# Patient Record
Sex: Male | Born: 1965 | Race: White | Hispanic: No | Marital: Single | Smoking: Never smoker
Health system: Southern US, Community
[De-identification: ages and names within clinical notes are randomized; demographics above are authoritative.]

## PROBLEM LIST (undated history)

## (undated) DIAGNOSIS — F419 Anxiety disorder, unspecified: Secondary | ICD-10-CM

## (undated) DIAGNOSIS — Z91018 Allergy to other foods: Secondary | ICD-10-CM

## (undated) DIAGNOSIS — K449 Diaphragmatic hernia without obstruction or gangrene: Secondary | ICD-10-CM

## (undated) DIAGNOSIS — Z5189 Encounter for other specified aftercare: Secondary | ICD-10-CM

## (undated) DIAGNOSIS — K589 Irritable bowel syndrome without diarrhea: Secondary | ICD-10-CM

## (undated) DIAGNOSIS — D509 Iron deficiency anemia, unspecified: Secondary | ICD-10-CM

## (undated) DIAGNOSIS — E785 Hyperlipidemia, unspecified: Secondary | ICD-10-CM

## (undated) DIAGNOSIS — T8859XA Other complications of anesthesia, initial encounter: Secondary | ICD-10-CM

## (undated) DIAGNOSIS — R51 Headache: Secondary | ICD-10-CM

## (undated) DIAGNOSIS — K2270 Barrett's esophagus without dysplasia: Secondary | ICD-10-CM

## (undated) DIAGNOSIS — R519 Headache, unspecified: Secondary | ICD-10-CM

## (undated) DIAGNOSIS — J45909 Unspecified asthma, uncomplicated: Secondary | ICD-10-CM

## (undated) DIAGNOSIS — L509 Urticaria, unspecified: Secondary | ICD-10-CM

## (undated) DIAGNOSIS — T4145XA Adverse effect of unspecified anesthetic, initial encounter: Secondary | ICD-10-CM

## (undated) HISTORY — DX: Anxiety disorder, unspecified: F41.9

## (undated) HISTORY — DX: Urticaria, unspecified: L50.9

## (undated) HISTORY — DX: Headache, unspecified: R51.9

## (undated) HISTORY — DX: Unspecified asthma, uncomplicated: J45.909

## (undated) HISTORY — DX: Headache: R51

## (undated) HISTORY — PX: TALC PLEURODESIS: SHX2506

## (undated) HISTORY — DX: Barrett's esophagus without dysplasia: K22.70

## (undated) HISTORY — DX: Hyperlipidemia, unspecified: E78.5

## (undated) HISTORY — DX: Encounter for other specified aftercare: Z51.89

## (undated) HISTORY — DX: Irritable bowel syndrome, unspecified: K58.9

## (undated) HISTORY — DX: Allergy to other foods: Z91.018

## (undated) HISTORY — DX: Iron deficiency anemia, unspecified: D50.9

## (undated) HISTORY — PX: GASTROSTOMY TUBE PLACEMENT: SHX655

---
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
## 2006-11-15 HISTORY — PX: CHOLECYSTECTOMY: SHX55

## 2008-11-15 HISTORY — PX: VENTRAL HERNIA REPAIR: SHX424

## 2015-07-17 HISTORY — PX: HIATAL HERNIA REPAIR: SHX195

## 2015-10-29 ENCOUNTER — Encounter (HOSPITAL_COMMUNITY): Payer: Self-pay | Admitting: *Deleted

## 2015-10-29 ENCOUNTER — Emergency Department (HOSPITAL_COMMUNITY)
Admission: EM | Admit: 2015-10-29 | Discharge: 2015-10-30 | Disposition: A | Discharge status: Home / Self Care | Payer: 59 | Attending: Emergency Medicine | Admitting: Emergency Medicine

## 2015-10-29 ENCOUNTER — Emergency Department (HOSPITAL_COMMUNITY): Payer: 59

## 2015-10-29 DIAGNOSIS — S29001A Unspecified injury of muscle and tendon of front wall of thorax, initial encounter: Secondary | ICD-10-CM | POA: Insufficient documentation

## 2015-10-29 DIAGNOSIS — S199XXA Unspecified injury of neck, initial encounter: Secondary | ICD-10-CM | POA: Diagnosis not present

## 2015-10-29 DIAGNOSIS — S8991XA Unspecified injury of right lower leg, initial encounter: Secondary | ICD-10-CM | POA: Insufficient documentation

## 2015-10-29 DIAGNOSIS — S0990XA Unspecified injury of head, initial encounter: Secondary | ICD-10-CM | POA: Insufficient documentation

## 2015-10-29 DIAGNOSIS — Y9389 Activity, other specified: Secondary | ICD-10-CM | POA: Insufficient documentation

## 2015-10-29 DIAGNOSIS — Z79899 Other long term (current) drug therapy: Secondary | ICD-10-CM | POA: Insufficient documentation

## 2015-10-29 DIAGNOSIS — Y998 Other external cause status: Secondary | ICD-10-CM | POA: Diagnosis not present

## 2015-10-29 DIAGNOSIS — Z8719 Personal history of other diseases of the digestive system: Secondary | ICD-10-CM | POA: Diagnosis not present

## 2015-10-29 DIAGNOSIS — Y92410 Unspecified street and highway as the place of occurrence of the external cause: Secondary | ICD-10-CM | POA: Insufficient documentation

## 2015-10-29 HISTORY — DX: Diaphragmatic hernia without obstruction or gangrene: K44.9

## 2015-10-29 LAB — I-STAT CHEM 8, ED
BUN: 17 mg/dL (ref 6–20)
Calcium, Ion: 1.19 mmol/L (ref 1.12–1.23)
Chloride: 101 mmol/L (ref 101–111)
Creatinine, Ser: 0.8 mg/dL (ref 0.61–1.24)
Glucose, Bld: 92 mg/dL (ref 65–99)
HEMATOCRIT: 33 % — AB (ref 39.0–52.0)
HEMOGLOBIN: 11.2 g/dL — AB (ref 13.0–17.0)
Potassium: 3.9 mmol/L (ref 3.5–5.1)
SODIUM: 139 mmol/L (ref 135–145)
TCO2: 26 mmol/L (ref 0–100)

## 2015-10-29 MED ORDER — SODIUM CHLORIDE 0.9 % IV BOLUS (SEPSIS)
1000.0000 mL | Freq: Once | INTRAVENOUS | Status: AC
  Administered 2015-10-29: 1000 mL via INTRAVENOUS

## 2015-10-29 MED ORDER — FENTANYL CITRATE (PF) 100 MCG/2ML IJ SOLN
50.0000 ug | Freq: Once | INTRAMUSCULAR | Status: AC
  Administered 2015-10-29: 50 ug via INTRAVENOUS
  Filled 2015-10-29: qty 2

## 2015-10-29 MED ORDER — PROMETHAZINE HCL 25 MG/ML IJ SOLN
25.0000 mg | Freq: Once | INTRAMUSCULAR | Status: AC
  Administered 2015-10-29: 25 mg via INTRAVENOUS
  Filled 2015-10-29: qty 1

## 2015-10-29 MED ORDER — HYDROMORPHONE HCL 1 MG/ML IJ SOLN
1.0000 mg | Freq: Once | INTRAMUSCULAR | Status: AC
  Administered 2015-10-29: 1 mg via INTRAVENOUS
  Filled 2015-10-29: qty 1

## 2015-10-29 MED ORDER — IOHEXOL 300 MG/ML  SOLN
100.0000 mL | Freq: Once | INTRAMUSCULAR | Status: AC | PRN
  Administered 2015-10-29: 100 mL via INTRAVENOUS

## 2015-10-29 NOTE — ED Notes (Signed)
Pt taken to CT.

## 2015-10-29 NOTE — ED Notes (Signed)
Pt c/o L sided headache and worsening L sided rib pain
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
## 2015-10-29 NOTE — ED Notes (Signed)
Pt to Xray.

## 2015-10-30 ENCOUNTER — Encounter (HOSPITAL_COMMUNITY): Payer: Self-pay | Admitting: *Deleted

## 2015-10-30 MED ORDER — PROMETHAZINE HCL 25 MG/ML IJ SOLN
25.0000 mg | Freq: Once | INTRAMUSCULAR | Status: AC
  Administered 2015-10-30: 25 mg via INTRAVENOUS
  Filled 2015-10-30: qty 1

## 2015-10-30 MED ORDER — OXYCODONE-ACETAMINOPHEN 5-325 MG PO TABS
2.0000 | ORAL_TABLET | Freq: Once | ORAL | Status: AC
  Administered 2015-10-30: 2 via ORAL
  Filled 2015-10-30: qty 2

## 2015-10-30 MED ORDER — CYCLOBENZAPRINE HCL 10 MG PO TABS: 10.0000 mg | ORAL_TABLET | Freq: Two times a day (BID) | ORAL | Status: DC | PRN

## 2015-10-30 MED ORDER — HEPARIN SOD (PORK) LOCK FLUSH 100 UNIT/ML IV SOLN
500.0000 [IU] | Freq: Once | INTRAVENOUS | Status: AC
  Administered 2015-10-30: 500 [IU]
  Filled 2015-10-30: qty 5

## 2015-10-30 MED ORDER — OXYCODONE-ACETAMINOPHEN 5-325 MG PO TABS: 1.0000 | ORAL_TABLET | Freq: Three times a day (TID) | ORAL | Status: DC | PRN

## 2015-10-30 NOTE — ED Notes (Signed)
Pt requesting more nausea and pain medication. MD made aware

## 2015-12-17 HISTORY — PX: ABDOMINAL SURGERY: SHX537

## 2017-01-12 DIAGNOSIS — R0789 Other chest pain: Secondary | ICD-10-CM | POA: Diagnosis not present

## 2017-01-12 DIAGNOSIS — R079 Chest pain, unspecified: Secondary | ICD-10-CM | POA: Diagnosis not present

## 2017-01-12 DIAGNOSIS — R197 Diarrhea, unspecified: Secondary | ICD-10-CM | POA: Diagnosis not present

## 2017-01-12 DIAGNOSIS — R112 Nausea with vomiting, unspecified: Secondary | ICD-10-CM | POA: Diagnosis not present

## 2017-01-26 DIAGNOSIS — R109 Unspecified abdominal pain: Secondary | ICD-10-CM | POA: Diagnosis not present

## 2017-04-26 DIAGNOSIS — R112 Nausea with vomiting, unspecified: Secondary | ICD-10-CM | POA: Diagnosis not present

## 2017-04-26 DIAGNOSIS — R109 Unspecified abdominal pain: Secondary | ICD-10-CM | POA: Diagnosis not present

## 2017-04-26 DIAGNOSIS — K449 Diaphragmatic hernia without obstruction or gangrene: Secondary | ICD-10-CM | POA: Diagnosis not present

## 2017-04-26 DIAGNOSIS — R1013 Epigastric pain: Secondary | ICD-10-CM | POA: Diagnosis not present

## 2017-04-26 DIAGNOSIS — K210 Gastro-esophageal reflux disease with esophagitis: Secondary | ICD-10-CM | POA: Diagnosis not present

## 2017-04-30 ENCOUNTER — Emergency Department (HOSPITAL_COMMUNITY)
Admission: EM | Admit: 2017-04-30 | Discharge: 2017-04-30 | Disposition: A | Discharge status: Home / Self Care | Payer: Self-pay | Attending: Emergency Medicine | Admitting: Emergency Medicine

## 2017-04-30 ENCOUNTER — Encounter (HOSPITAL_COMMUNITY): Payer: Self-pay

## 2017-04-30 ENCOUNTER — Emergency Department (HOSPITAL_COMMUNITY): Payer: Self-pay

## 2017-04-30 DIAGNOSIS — R079 Chest pain, unspecified: Secondary | ICD-10-CM | POA: Insufficient documentation

## 2017-04-30 DIAGNOSIS — A09 Infectious gastroenteritis and colitis, unspecified: Secondary | ICD-10-CM | POA: Diagnosis not present

## 2017-04-30 DIAGNOSIS — Z5321 Procedure and treatment not carried out due to patient leaving prior to being seen by health care provider: Secondary | ICD-10-CM | POA: Insufficient documentation

## 2017-04-30 DIAGNOSIS — R1084 Generalized abdominal pain: Secondary | ICD-10-CM | POA: Diagnosis not present

## 2017-04-30 DIAGNOSIS — R109 Unspecified abdominal pain: Secondary | ICD-10-CM | POA: Insufficient documentation

## 2017-04-30 LAB — COMPREHENSIVE METABOLIC PANEL
ALT: 19 U/L (ref 17–63)
AST: 25 U/L (ref 15–41)
Albumin: 4.4 g/dL (ref 3.5–5.0)
Alkaline Phosphatase: 63 U/L (ref 38–126)
Anion gap: 9 (ref 5–15)
BUN: 14 mg/dL (ref 6–20)
CHLORIDE: 101 mmol/L (ref 101–111)
CO2: 27 mmol/L (ref 22–32)
CREATININE: 0.92 mg/dL (ref 0.61–1.24)
Calcium: 9.3 mg/dL (ref 8.9–10.3)
GFR calc non Af Amer: >60 mL/min (ref >60)
Glucose, Bld: 119 mg/dL — ABNORMAL HIGH (ref 65–99)
Potassium: 3.7 mmol/L (ref 3.5–5.1)
SODIUM: 137 mmol/L (ref 135–145)
Total Bilirubin: 0.5 mg/dL (ref 0.3–1.2)
Total Protein: 7.4 g/dL (ref 6.5–8.1)

## 2017-04-30 LAB — I-STAT TROPONIN, ED: Troponin i, poc: 0 ng/mL (ref 0.00–0.08)

## 2017-04-30 LAB — CBC
HEMATOCRIT: 45.9 % (ref 39.0–52.0)
HEMOGLOBIN: 16.1 g/dL (ref 13.0–17.0)
MCH: 30 pg (ref 26.0–34.0)
MCHC: 35.1 g/dL (ref 30.0–36.0)
MCV: 85.6 fL (ref 78.0–100.0)
Platelets: 159 10*3/uL (ref 150–400)
RBC: 5.36 MIL/uL (ref 4.22–5.81)
RDW: 15.8 % — ABNORMAL HIGH (ref 11.5–15.5)
WBC: 5.1 10*3/uL (ref 4.0–10.5)

## 2017-04-30 LAB — LIPASE, BLOOD: Lipase: 22 U/L (ref 11–51)

## 2017-04-30 NOTE — ED Triage Notes (Signed)
Pt complaining of mid abdominal pain that radiates to chest x 1 week. Pt states multiple abdominal surgeries in the past. Pt complaining of some SOB.

## 2017-05-06 DIAGNOSIS — E119 Type 2 diabetes mellitus without complications: Secondary | ICD-10-CM | POA: Diagnosis not present

## 2017-05-06 DIAGNOSIS — H40013 Open angle with borderline findings, low risk, bilateral: Secondary | ICD-10-CM | POA: Diagnosis not present

## 2017-05-10 DIAGNOSIS — R1013 Epigastric pain: Secondary | ICD-10-CM | POA: Diagnosis not present

## 2017-05-10 DIAGNOSIS — R109 Unspecified abdominal pain: Secondary | ICD-10-CM | POA: Diagnosis not present

## 2017-05-10 DIAGNOSIS — R1084 Generalized abdominal pain: Secondary | ICD-10-CM | POA: Diagnosis not present

## 2017-05-13 ENCOUNTER — Emergency Department (HOSPITAL_COMMUNITY)
Admission: EM | Admit: 2017-05-13 | Discharge: 2017-05-14 | Disposition: A | Discharge status: Home / Self Care | Payer: Medicare Other | Attending: Emergency Medicine | Admitting: Emergency Medicine

## 2017-05-13 ENCOUNTER — Emergency Department (HOSPITAL_COMMUNITY): Payer: Medicare Other

## 2017-05-13 DIAGNOSIS — R1084 Generalized abdominal pain: Secondary | ICD-10-CM | POA: Diagnosis not present

## 2017-05-13 DIAGNOSIS — R112 Nausea with vomiting, unspecified: Secondary | ICD-10-CM | POA: Insufficient documentation

## 2017-05-13 DIAGNOSIS — Z681 Body mass index (BMI) 19 or less, adult: Secondary | ICD-10-CM | POA: Diagnosis not present

## 2017-05-13 DIAGNOSIS — R109 Unspecified abdominal pain: Secondary | ICD-10-CM

## 2017-05-13 DIAGNOSIS — R197 Diarrhea, unspecified: Secondary | ICD-10-CM | POA: Insufficient documentation

## 2017-05-13 LAB — COMPREHENSIVE METABOLIC PANEL
ALT: 23 U/L (ref 17–63)
ANION GAP: 11 (ref 5–15)
AST: 33 U/L (ref 15–41)
Albumin: 4.4 g/dL (ref 3.5–5.0)
Alkaline Phosphatase: 73 U/L (ref 38–126)
BUN: 11 mg/dL (ref 6–20)
CHLORIDE: 104 mmol/L (ref 101–111)
CO2: 20 mmol/L — AB (ref 22–32)
Calcium: 9.5 mg/dL (ref 8.9–10.3)
Creatinine, Ser: 0.78 mg/dL (ref 0.61–1.24)
GFR calc non Af Amer: >60 mL/min (ref >60)
Glucose, Bld: 109 mg/dL — ABNORMAL HIGH (ref 65–99)
POTASSIUM: 4.1 mmol/L (ref 3.5–5.1)
SODIUM: 135 mmol/L (ref 135–145)
Total Bilirubin: 0.8 mg/dL (ref 0.3–1.2)
Total Protein: 7.6 g/dL (ref 6.5–8.1)

## 2017-05-13 LAB — CBC
HEMATOCRIT: 46.1 % (ref 39.0–52.0)
HEMOGLOBIN: 15.8 g/dL (ref 13.0–17.0)
MCH: 29.2 pg (ref 26.0–34.0)
MCHC: 34.3 g/dL (ref 30.0–36.0)
MCV: 85.2 fL (ref 78.0–100.0)
Platelets: 272 10*3/uL (ref 150–400)
RBC: 5.41 MIL/uL (ref 4.22–5.81)
RDW: 15.1 % (ref 11.5–15.5)
WBC: 5.6 10*3/uL (ref 4.0–10.5)

## 2017-05-13 LAB — I-STAT CG4 LACTIC ACID, ED
Lactic Acid, Venous: 1.77 mmol/L (ref 0.5–1.9)
Lactic Acid, Venous: 1.25 mmol/L (ref 0.5–1.9)

## 2017-05-13 LAB — URINALYSIS, ROUTINE W REFLEX MICROSCOPIC
Bilirubin Urine: NEGATIVE
GLUCOSE, UA: NEGATIVE mg/dL
HGB URINE DIPSTICK: NEGATIVE
Ketones, ur: 5 mg/dL — AB
LEUKOCYTES UA: NEGATIVE
Nitrite: NEGATIVE
Protein, ur: NEGATIVE mg/dL
SPECIFIC GRAVITY, URINE: 1.016 (ref 1.005–1.030)
pH: 6 (ref 5.0–8.0)

## 2017-05-13 LAB — LIPASE, BLOOD: LIPASE: 20 U/L (ref 11–51)

## 2017-05-13 MED ORDER — DIPHENHYDRAMINE HCL 50 MG/ML IJ SOLN
25.0000 mg | Freq: Once | INTRAMUSCULAR | Status: AC
  Administered 2017-05-13: 25 mg via INTRAVENOUS
  Filled 2017-05-13: qty 1

## 2017-05-13 MED ORDER — MORPHINE SULFATE (PF) 4 MG/ML IV SOLN: 4.0000 mg | Freq: Once | INTRAVENOUS | Status: DC

## 2017-05-13 MED ORDER — SODIUM CHLORIDE 0.9 % IV BOLUS (SEPSIS)
1000.0000 mL | Freq: Once | INTRAVENOUS | Status: AC
  Administered 2017-05-13: 1000 mL via INTRAVENOUS

## 2017-05-13 MED ORDER — PROCHLORPERAZINE EDISYLATE 5 MG/ML IJ SOLN
10.0000 mg | Freq: Once | INTRAMUSCULAR | Status: AC
  Administered 2017-05-13: 10 mg via INTRAVENOUS
  Filled 2017-05-13: qty 2

## 2017-05-13 MED ORDER — HYDROMORPHONE HCL 1 MG/ML IJ SOLN
1.0000 mg | Freq: Once | INTRAMUSCULAR | Status: AC
  Administered 2017-05-13: 1 mg via INTRAVENOUS
  Filled 2017-05-13: qty 1

## 2017-05-13 MED ORDER — IOPAMIDOL (ISOVUE-300) INJECTION 61%
INTRAVENOUS | Status: AC
  Filled 2017-05-13: qty 100

## 2017-05-13 NOTE — ED Notes (Signed)
Pt at nurse first requesting update, informed him a room has been assigned and sorry for delay.

## 2017-05-13 NOTE — ED Triage Notes (Signed)
Pt c/o RUQ pain x's 3 days with nausea, vomiting and diarrhea.  St's unable to keep anything down.

## 2017-05-13 NOTE — ED Provider Notes (Signed)
[facility]ER[facility]ENCY DEPT Provider Note   CSN: [id number] Arrival date & time: 05/13/17  1615     History   Chief Complaint Chief Complaint  Patient presents with  . Abdominal Pain  . Emesis  . Nausea    HPI Edwin DoverWilliam E Hall is a 51 y.o. male.  51 yo M with a cc of right sided abdominal pain.  Going on for past 3 day. N/v/d fever.  The patient has had multiple abdominal surgeries including an appendectomy cholecystectomy pyloroplasty. 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 He thinks this pain feels like his prior diaphragm rupturing. All his surgeries were done in [place]. Denies fever with this.   The history is provided by the patient.  Abdominal Pain   This is a new problem. The current episode started 2 days ago. The problem occurs constantly. The problem has been gradually worsening. The pain is associated with eating. The pain is located in the RUQ and RLQ. The pain is at a severity of 10/10. The pain is severe. Associated symptoms include diarrhea, nausea and vomiting. Pertinent negatives include fever, headaches, arthralgias and myalgias. Nothing aggravates the symptoms. Nothing relieves the symptoms.  Emesis   Associated symptoms include abdominal pain and diarrhea. Pertinent negatives include no arthralgias, no chills, no fever, no headaches and no myalgias.    Past Medical History:  Diagnosis Date  . Hiatal hernia     There are no active problems to display for this patient.   Past Surgical History:  Procedure Laterality Date  . ABDOMINAL SURGERY    . APPENDECTOMY    . CHOLECYSTECTOMY    . GASTROSTOMY TUBE PLACEMENT         Home Medications    Prior to Admission medications   Medication Sig Start Date End Date Taking? Authorizing Provider  Cyanocobalamin (B-12 PO) Take 1 tablet by mouth daily.   Yes [provider]  Multiple Vitamin (MULTIVITAMIN WITH MINERALS) TABS tablet Take 1 tablet by mouth daily.   Yes [provider]  promethazine (PHENERGAN) 12.5 MG tablet Take 12.5 mg by mouth 2 (two) times daily as needed. 02/21/17  Yes [provider]  cyclobenzaprine (FLEXERIL) 10 MG tablet Take 1 tablet (10 mg total) by mouth 2 (two) times daily as needed for muscle spasms. Patient not taking: Reported on 05/13/2017 10/30/15   Mesner, Barbara CowerJason, MD  oxyCODONE-acetaminophen (PERCOCET/ROXICET) 5-325 MG tablet Take 1-2 tablets by mouth every 8 (eight) hours as needed for severe pain. Patient not taking: Reported on 05/13/2017 10/30/15   Mesner, Barbara CowerJason, MD    Family History No family history on file.  Social History Social History  Substance Use Topics  . Smoking status: Never Smoker  . Smokeless tobacco: Never Used  . Alcohol use No     Allergies   Other; Shellfish allergy; Tetracyclines & related; and Zofran [ondansetron hcl]   Review of Systems Review of Systems  Constitutional: Negative for chills and fever.  HENT: Negative for congestion and facial swelling.   Eyes: Negative for discharge and visual disturbance.  Respiratory: Negative for shortness of breath.   Cardiovascular: Negative for chest pain and palpitations.  Gastrointestinal: Positive for abdominal pain, diarrhea, nausea and vomiting.  Musculoskeletal: Negative for arthralgias and myalgias.  Skin: Negative for color change and rash.  Neurological: Negative for tremors, syncope and headaches.  Psychiatric/Behavioral: Negative for confusion and dysphoric mood.     Physical Exam Updated Vital Signs BP (!) 136/92   Pulse 88  Temp 98.4 F (36.9 C) (Oral)   Resp 18   Ht 5\' 8"  (1.727 m)   Wt 53.1 kg (117 lb)   SpO2 99%   BMI 17.79 kg/m   Physical Exam  Constitutional: He is oriented to person, place, and time. He appears well-developed and well-nourished.  HENT:  Head: Normocephalic and atraumatic.  Eyes: EOM are normal. Pupils are equal, round, and reactive to light.  Neck: Normal range of motion. Neck supple. No JVD  present.  Cardiovascular: Normal rate and regular rhythm.  Exam reveals no gallop and no friction rub.   No murmur heard. Pulmonary/Chest: No respiratory distress. He has no wheezes.  Abdominal: He exhibits no distension. There is tenderness. There is no rebound and no guarding.  Multiple abdominal scars. Pain worse to the right midabdomen.  Musculoskeletal: Normal range of motion.  Neurological: He is alert and oriented to person, place, and time.  Skin: No rash noted. No pallor.  Psychiatric: He has a normal mood and affect. His behavior is normal.  Nursing note and vitals reviewed.    ED Treatments / Results  Labs (all labs ordered are listed, but only abnormal results are displayed) Labs Reviewed  COMPREHENSIVE METABOLIC PANEL - Abnormal; Notable for the following:       Result Value   CO2 20 (*)    Glucose, Bld 109 (*)    All other components within normal limits  URINALYSIS, ROUTINE W REFLEX MICROSCOPIC - Abnormal; Notable for the following:    Ketones, ur 5 (*)    All other components within normal limits  LIPASE, BLOOD  CBC  I-STAT CG4 LACTIC ACID, ED  I-STAT CG4 LACTIC ACID, ED    EKG  EKG Interpretation None       Radiology Ct Abdomen Pelvis Wo Contrast  Result Date: 05/13/2017 CLINICAL DATA:  Right upper quadrant pain for 3 days with nausea vomiting and diarrhea. EXAM: CT ABDOMEN AND PELVIS WITHOUT CONTRAST TECHNIQUE: Multidetector CT imaging of the abdomen and pelvis was performed following the standard protocol without IV contrast. 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 No acute findings in the lower chest. Hepatobiliary: Cholecystectomy. No focal liver lesions. No bile duct dilatation. Pancreas: Unremarkable. No pancreatic ductal dilatation or surrounding inflammatory changes. Spleen: Normal in size without focal abnormality. Adrenals/Urinary Tract: Adrenal glands are unremarkable. Kidneys are normal,  without renal calculi, focal lesion, or hydronephrosis. Bladder is unremarkable. Stomach/Bowel: Surgical clips around the proximal stomach. Small bowel is unremarkable. Colon is unremarkable. Prior appendectomy. Vascular/Lymphatic: The abdominal aorta is normal in caliber with minimal atherosclerotic calcifications. No adenopathy in the abdomen or pelvis. Reproductive: Unremarkable Other: No focal inflammation.  No ascites. Musculoskeletal: No significant skeletal lesion. IMPRESSION: No acute findings. Prior surgery in the epigastrium. 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 Electronically Signed   By: Ellery Plunk M.D.   On: 05/13/2017 23:25    Procedures Procedures (including critical care time)  Medications Ordered in ED Medications  iopamidol (ISOVUE-300) 61 % injection (not administered)  sodium chloride 0.9 % bolus 1,000 mL (1,000 mLs Intravenous New Bag/Given 05/13/17 2243)  prochlorperazine (COMPAZINE) injection 10 mg (10 mg Intravenous Given 05/13/17 2233)  diphenhydrAMINE (BENADRYL) injection 25 mg (25 mg Intravenous Given 05/13/17 2233)  HYDROmorphone (DILAUDID) injection 1 mg (1 mg Intravenous Given 05/13/17 2234)     Initial Impression / Assessment and Plan / ED Course  I have reviewed the triage vital signs and the nursing notes.  Pertinent  labs & imaging results that were available during my care of the patient were reviewed by me and considered in my medical decision making (see chart for details).     51 yo M with a chief complaint of nausea vomiting diarrhea. Going on for the past 3 days. On exam the patient has diffuse abdominal pain but worse in the right side of the abdomen. Multiple surgical scars on exam. CT scan without acute finding. On reassessment patient feeling much better. Tolerating by mouth. Discharge home.  12:11 AM:  I have discussed the diagnosis/risks/treatment options with the patient and believe the pt to be eligible for discharge home to  follow-up with PCP. We also discussed returning to the ED immediately if new or worsening sx occur. We discussed the sx which are most concerning (e.g., sudden worsening pain, fever, inability to tolerate by mouth) that necessitate immediate return. Medications administered to the patient during their visit and any new prescriptions provided to the patient are listed below.  Medications given during this visit Medications  iopamidol (ISOVUE-300) 61 % injection (not administered)  sodium chloride 0.9 % bolus 1,000 mL (1,000 mLs Intravenous New Bag/Given 05/13/17 2243)  prochlorperazine (COMPAZINE) injection 10 mg (10 mg Intravenous Given 05/13/17 2233)  diphenhydrAMINE (BENADRYL) injection 25 mg (25 mg Intravenous Given 05/13/17 2233)  HYDROmorphone (DILAUDID) injection 1 mg (1 mg Intravenous Given 05/13/17 2234)     The patient appears reasonably screen and/or stabilized for discharge and I doubt any other medical condition or other [facility] requiring further screening, evaluation, or treatment in the ED at this time prior to discharge.    Final Clinical Impressions(s) / ED Diagnoses   Final diagnoses:  Abdominal pain    New Prescriptions New Prescriptions   No medications on file     Melene Plan, DO 05/14/17 0011

## 2017-05-14 MED ORDER — PROMETHAZINE HCL 25 MG PO TABS: 25.0000 mg | ORAL_TABLET | Freq: Four times a day (QID) | ORAL | 0 refills | Status: DC | PRN

## 2017-05-14 MED ORDER — HEPARIN SOD (PORK) LOCK FLUSH 100 UNIT/ML IV SOLN: 500.0000 [IU] | INTRAVENOUS | Status: DC | PRN

## 2017-05-14 NOTE — Discharge Instructions (Signed)
Imodium for diarrhea.  Return for pinpoint abdominal tenderness, fever or inability to eat or drink.

## 2017-05-14 NOTE — ED Notes (Signed)
Pt stable and ambulatory for discharge

## 2017-05-31 DIAGNOSIS — R1110 Vomiting, unspecified: Secondary | ICD-10-CM | POA: Diagnosis not present

## 2017-05-31 DIAGNOSIS — R109 Unspecified abdominal pain: Secondary | ICD-10-CM | POA: Diagnosis not present

## 2017-05-31 DIAGNOSIS — R51 Headache: Secondary | ICD-10-CM | POA: Diagnosis not present

## 2017-05-31 DIAGNOSIS — R197 Diarrhea, unspecified: Secondary | ICD-10-CM | POA: Diagnosis not present

## 2017-06-21 DIAGNOSIS — R51 Headache: Secondary | ICD-10-CM | POA: Diagnosis not present

## 2017-06-21 DIAGNOSIS — Z681 Body mass index (BMI) 19 or less, adult: Secondary | ICD-10-CM | POA: Diagnosis not present

## 2017-06-24 DIAGNOSIS — R51 Headache: Secondary | ICD-10-CM | POA: Diagnosis not present

## 2017-07-01 DIAGNOSIS — G4733 Obstructive sleep apnea (adult) (pediatric): Secondary | ICD-10-CM | POA: Diagnosis not present

## 2017-07-01 DIAGNOSIS — R0902 Hypoxemia: Secondary | ICD-10-CM | POA: Diagnosis not present

## 2017-07-05 DIAGNOSIS — G4733 Obstructive sleep apnea (adult) (pediatric): Secondary | ICD-10-CM | POA: Diagnosis not present

## 2017-07-07 DIAGNOSIS — I10 Essential (primary) hypertension: Secondary | ICD-10-CM | POA: Diagnosis not present

## 2017-07-07 DIAGNOSIS — F3160 Bipolar disorder, current episode mixed, unspecified: Secondary | ICD-10-CM | POA: Diagnosis not present

## 2017-07-07 DIAGNOSIS — R51 Headache: Secondary | ICD-10-CM | POA: Diagnosis not present

## 2017-07-07 DIAGNOSIS — M79609 Pain in unspecified limb: Secondary | ICD-10-CM | POA: Diagnosis not present

## 2017-08-04 DIAGNOSIS — R1310 Dysphagia, unspecified: Secondary | ICD-10-CM | POA: Diagnosis not present

## 2017-08-04 DIAGNOSIS — K219 Gastro-esophageal reflux disease without esophagitis: Secondary | ICD-10-CM | POA: Diagnosis not present

## 2017-08-04 DIAGNOSIS — K5900 Constipation, unspecified: Secondary | ICD-10-CM | POA: Diagnosis not present

## 2017-08-04 DIAGNOSIS — K2270 Barrett's esophagus without dysplasia: Secondary | ICD-10-CM | POA: Diagnosis not present

## 2017-08-05 DIAGNOSIS — K209 Esophagitis, unspecified: Secondary | ICD-10-CM | POA: Diagnosis not present

## 2017-08-05 DIAGNOSIS — K2270 Barrett's esophagus without dysplasia: Secondary | ICD-10-CM | POA: Diagnosis not present

## 2017-08-05 DIAGNOSIS — R1310 Dysphagia, unspecified: Secondary | ICD-10-CM | POA: Diagnosis not present

## 2017-08-09 DIAGNOSIS — R51 Headache: Secondary | ICD-10-CM | POA: Diagnosis not present

## 2017-08-09 DIAGNOSIS — R110 Nausea: Secondary | ICD-10-CM | POA: Diagnosis not present

## 2017-08-09 DIAGNOSIS — Z23 Encounter for immunization: Secondary | ICD-10-CM | POA: Diagnosis not present

## 2017-08-09 DIAGNOSIS — Z139 Encounter for screening, unspecified: Secondary | ICD-10-CM | POA: Diagnosis not present

## 2017-08-09 DIAGNOSIS — Z6820 Body mass index (BMI) 20.0-20.9, adult: Secondary | ICD-10-CM | POA: Diagnosis not present

## 2017-08-29 ENCOUNTER — Encounter: Payer: Self-pay | Admitting: *Deleted

## 2017-08-30 ENCOUNTER — Ambulatory Visit: Payer: Medicare Other | Admitting: Diagnostic Neuroimaging

## 2017-08-31 ENCOUNTER — Encounter: Payer: Self-pay | Admitting: Diagnostic Neuroimaging

## 2017-10-28 DIAGNOSIS — J300 Vasomotor rhinitis: Secondary | ICD-10-CM | POA: Diagnosis not present

## 2017-10-28 DIAGNOSIS — L218 Other seborrheic dermatitis: Secondary | ICD-10-CM | POA: Diagnosis not present

## 2017-10-28 DIAGNOSIS — Z6820 Body mass index (BMI) 20.0-20.9, adult: Secondary | ICD-10-CM | POA: Diagnosis not present

## 2017-11-16 DIAGNOSIS — Z6820 Body mass index (BMI) 20.0-20.9, adult: Secondary | ICD-10-CM | POA: Diagnosis not present

## 2017-11-16 DIAGNOSIS — J300 Vasomotor rhinitis: Secondary | ICD-10-CM | POA: Diagnosis not present

## 2017-11-18 DIAGNOSIS — K591 Functional diarrhea: Secondary | ICD-10-CM | POA: Diagnosis not present

## 2017-11-18 DIAGNOSIS — R0602 Shortness of breath: Secondary | ICD-10-CM | POA: Diagnosis not present

## 2017-11-18 DIAGNOSIS — R197 Diarrhea, unspecified: Secondary | ICD-10-CM | POA: Diagnosis not present

## 2017-11-18 DIAGNOSIS — R112 Nausea with vomiting, unspecified: Secondary | ICD-10-CM | POA: Diagnosis not present

## 2017-11-20 DIAGNOSIS — R0602 Shortness of breath: Secondary | ICD-10-CM | POA: Diagnosis not present

## 2017-11-20 DIAGNOSIS — J111 Influenza due to unidentified influenza virus with other respiratory manifestations: Secondary | ICD-10-CM | POA: Diagnosis not present

## 2017-11-20 DIAGNOSIS — R197 Diarrhea, unspecified: Secondary | ICD-10-CM | POA: Diagnosis not present

## 2017-11-20 DIAGNOSIS — K219 Gastro-esophageal reflux disease without esophagitis: Secondary | ICD-10-CM | POA: Diagnosis not present

## 2017-11-20 DIAGNOSIS — R112 Nausea with vomiting, unspecified: Secondary | ICD-10-CM | POA: Diagnosis not present

## 2017-11-20 DIAGNOSIS — Z79899 Other long term (current) drug therapy: Secondary | ICD-10-CM | POA: Diagnosis not present

## 2017-11-20 DIAGNOSIS — R1110 Vomiting, unspecified: Secondary | ICD-10-CM | POA: Diagnosis not present

## 2017-11-30 DIAGNOSIS — J22 Unspecified acute lower respiratory infection: Secondary | ICD-10-CM | POA: Diagnosis not present

## 2017-11-30 DIAGNOSIS — Z6820 Body mass index (BMI) 20.0-20.9, adult: Secondary | ICD-10-CM | POA: Diagnosis not present

## 2017-11-30 DIAGNOSIS — R05 Cough: Secondary | ICD-10-CM | POA: Diagnosis not present

## 2017-12-05 DIAGNOSIS — J22 Unspecified acute lower respiratory infection: Secondary | ICD-10-CM | POA: Diagnosis not present

## 2017-12-09 DIAGNOSIS — R0602 Shortness of breath: Secondary | ICD-10-CM | POA: Diagnosis not present

## 2017-12-09 DIAGNOSIS — R197 Diarrhea, unspecified: Secondary | ICD-10-CM | POA: Diagnosis not present

## 2017-12-09 DIAGNOSIS — J479 Bronchiectasis, uncomplicated: Secondary | ICD-10-CM | POA: Diagnosis not present

## 2017-12-09 DIAGNOSIS — J90 Pleural effusion, not elsewhere classified: Secondary | ICD-10-CM | POA: Diagnosis not present

## 2017-12-09 DIAGNOSIS — R112 Nausea with vomiting, unspecified: Secondary | ICD-10-CM | POA: Diagnosis not present

## 2017-12-16 DIAGNOSIS — J9801 Acute bronchospasm: Secondary | ICD-10-CM | POA: Diagnosis not present

## 2017-12-16 DIAGNOSIS — Z6820 Body mass index (BMI) 20.0-20.9, adult: Secondary | ICD-10-CM | POA: Diagnosis not present

## 2017-12-22 DIAGNOSIS — Z7189 Other specified counseling: Secondary | ICD-10-CM | POA: Diagnosis not present

## 2017-12-22 DIAGNOSIS — Z6820 Body mass index (BMI) 20.0-20.9, adult: Secondary | ICD-10-CM | POA: Diagnosis not present

## 2017-12-22 DIAGNOSIS — J9801 Acute bronchospasm: Secondary | ICD-10-CM | POA: Diagnosis not present

## 2017-12-22 DIAGNOSIS — Z1331 Encounter for screening for depression: Secondary | ICD-10-CM | POA: Diagnosis not present

## 2017-12-22 DIAGNOSIS — Z139 Encounter for screening, unspecified: Secondary | ICD-10-CM | POA: Diagnosis not present

## 2017-12-22 DIAGNOSIS — Z0000 Encounter for general adult medical examination without abnormal findings: Secondary | ICD-10-CM | POA: Diagnosis not present

## 2018-03-30 DIAGNOSIS — Z1211 Encounter for screening for malignant neoplasm of colon: Secondary | ICD-10-CM | POA: Diagnosis not present

## 2018-03-30 DIAGNOSIS — Z1331 Encounter for screening for depression: Secondary | ICD-10-CM | POA: Diagnosis not present

## 2018-03-30 DIAGNOSIS — Z0000 Encounter for general adult medical examination without abnormal findings: Secondary | ICD-10-CM | POA: Diagnosis not present

## 2018-03-30 DIAGNOSIS — Z1212 Encounter for screening for malignant neoplasm of rectum: Secondary | ICD-10-CM | POA: Diagnosis not present

## 2018-04-04 DIAGNOSIS — Z111 Encounter for screening for respiratory tuberculosis: Secondary | ICD-10-CM | POA: Diagnosis not present

## 2018-04-24 DIAGNOSIS — R1900 Intra-abdominal and pelvic swelling, mass and lump, unspecified site: Secondary | ICD-10-CM | POA: Diagnosis not present

## 2018-04-24 DIAGNOSIS — I700 Atherosclerosis of aorta: Secondary | ICD-10-CM | POA: Diagnosis not present

## 2018-04-24 DIAGNOSIS — N289 Disorder of kidney and ureter, unspecified: Secondary | ICD-10-CM | POA: Diagnosis not present

## 2018-04-26 DIAGNOSIS — R109 Unspecified abdominal pain: Secondary | ICD-10-CM | POA: Diagnosis not present

## 2018-08-03 DIAGNOSIS — Z681 Body mass index (BMI) 19 or less, adult: Secondary | ICD-10-CM | POA: Diagnosis not present

## 2018-08-03 DIAGNOSIS — N529 Male erectile dysfunction, unspecified: Secondary | ICD-10-CM | POA: Diagnosis not present

## 2018-08-22 DIAGNOSIS — Z95828 Presence of other vascular implants and grafts: Secondary | ICD-10-CM | POA: Diagnosis not present

## 2018-08-22 DIAGNOSIS — N529 Male erectile dysfunction, unspecified: Secondary | ICD-10-CM | POA: Diagnosis not present

## 2018-08-22 DIAGNOSIS — Z681 Body mass index (BMI) 19 or less, adult: Secondary | ICD-10-CM | POA: Diagnosis not present

## 2018-08-28 DIAGNOSIS — N529 Male erectile dysfunction, unspecified: Secondary | ICD-10-CM | POA: Diagnosis not present

## 2018-08-28 DIAGNOSIS — Z23 Encounter for immunization: Secondary | ICD-10-CM | POA: Diagnosis not present

## 2018-08-28 DIAGNOSIS — Z6820 Body mass index (BMI) 20.0-20.9, adult: Secondary | ICD-10-CM | POA: Diagnosis not present

## 2018-08-28 DIAGNOSIS — F419 Anxiety disorder, unspecified: Secondary | ICD-10-CM | POA: Diagnosis not present

## 2018-09-11 DIAGNOSIS — N529 Male erectile dysfunction, unspecified: Secondary | ICD-10-CM | POA: Diagnosis not present

## 2018-09-11 DIAGNOSIS — Z131 Encounter for screening for diabetes mellitus: Secondary | ICD-10-CM | POA: Diagnosis not present

## 2018-09-19 DIAGNOSIS — J45998 Other asthma: Secondary | ICD-10-CM | POA: Diagnosis not present

## 2018-09-19 DIAGNOSIS — E46 Unspecified protein-calorie malnutrition: Secondary | ICD-10-CM | POA: Diagnosis not present

## 2018-09-19 DIAGNOSIS — I998 Other disorder of circulatory system: Secondary | ICD-10-CM | POA: Diagnosis not present

## 2018-09-19 DIAGNOSIS — Z125 Encounter for screening for malignant neoplasm of prostate: Secondary | ICD-10-CM | POA: Diagnosis not present

## 2018-09-19 DIAGNOSIS — F321 Major depressive disorder, single episode, moderate: Secondary | ICD-10-CM | POA: Diagnosis not present

## 2018-09-19 DIAGNOSIS — R1013 Epigastric pain: Secondary | ICD-10-CM | POA: Diagnosis not present

## 2018-09-25 DIAGNOSIS — E46 Unspecified protein-calorie malnutrition: Secondary | ICD-10-CM | POA: Diagnosis not present

## 2018-09-25 DIAGNOSIS — Z681 Body mass index (BMI) 19 or less, adult: Secondary | ICD-10-CM | POA: Diagnosis not present

## 2018-11-09 DIAGNOSIS — R5381 Other malaise: Secondary | ICD-10-CM | POA: Diagnosis not present

## 2018-11-09 DIAGNOSIS — D509 Iron deficiency anemia, unspecified: Secondary | ICD-10-CM | POA: Diagnosis not present

## 2018-11-09 DIAGNOSIS — R5383 Other fatigue: Secondary | ICD-10-CM | POA: Diagnosis not present

## 2018-11-09 DIAGNOSIS — F321 Major depressive disorder, single episode, moderate: Secondary | ICD-10-CM | POA: Diagnosis not present

## 2018-11-16 DIAGNOSIS — F321 Major depressive disorder, single episode, moderate: Secondary | ICD-10-CM | POA: Diagnosis not present

## 2018-11-16 DIAGNOSIS — Z681 Body mass index (BMI) 19 or less, adult: Secondary | ICD-10-CM | POA: Diagnosis not present

## 2018-11-16 DIAGNOSIS — Z6820 Body mass index (BMI) 20.0-20.9, adult: Secondary | ICD-10-CM | POA: Diagnosis not present

## 2018-11-16 DIAGNOSIS — K921 Melena: Secondary | ICD-10-CM | POA: Diagnosis not present

## 2018-11-16 DIAGNOSIS — E46 Unspecified protein-calorie malnutrition: Secondary | ICD-10-CM | POA: Diagnosis not present

## 2018-12-21 DIAGNOSIS — M791 Myalgia, unspecified site: Secondary | ICD-10-CM | POA: Diagnosis not present

## 2018-12-21 DIAGNOSIS — R5383 Other fatigue: Secondary | ICD-10-CM | POA: Diagnosis not present

## 2018-12-21 DIAGNOSIS — Z6820 Body mass index (BMI) 20.0-20.9, adult: Secondary | ICD-10-CM | POA: Diagnosis not present

## 2018-12-21 DIAGNOSIS — F321 Major depressive disorder, single episode, moderate: Secondary | ICD-10-CM | POA: Diagnosis not present

## 2019-01-04 DIAGNOSIS — Z209 Contact with and (suspected) exposure to unspecified communicable disease: Secondary | ICD-10-CM | POA: Diagnosis not present

## 2019-01-04 DIAGNOSIS — S46812A Strain of other muscles, fascia and tendons at shoulder and upper arm level, left arm, initial encounter: Secondary | ICD-10-CM | POA: Diagnosis not present

## 2019-01-04 DIAGNOSIS — Z681 Body mass index (BMI) 19 or less, adult: Secondary | ICD-10-CM | POA: Diagnosis not present

## 2019-02-15 DIAGNOSIS — Z6820 Body mass index (BMI) 20.0-20.9, adult: Secondary | ICD-10-CM | POA: Diagnosis not present

## 2019-02-15 DIAGNOSIS — Z7189 Other specified counseling: Secondary | ICD-10-CM | POA: Diagnosis not present

## 2019-02-15 DIAGNOSIS — H00015 Hordeolum externum left lower eyelid: Secondary | ICD-10-CM | POA: Diagnosis not present

## 2019-02-15 DIAGNOSIS — Z209 Contact with and (suspected) exposure to unspecified communicable disease: Secondary | ICD-10-CM | POA: Diagnosis not present

## 2019-02-15 DIAGNOSIS — L239 Allergic contact dermatitis, unspecified cause: Secondary | ICD-10-CM | POA: Diagnosis not present

## 2019-02-21 DIAGNOSIS — L02419 Cutaneous abscess of limb, unspecified: Secondary | ICD-10-CM | POA: Diagnosis not present

## 2019-02-21 DIAGNOSIS — H00016 Hordeolum externum left eye, unspecified eyelid: Secondary | ICD-10-CM | POA: Diagnosis not present

## 2019-03-16 DIAGNOSIS — K210 Gastro-esophageal reflux disease with esophagitis: Secondary | ICD-10-CM | POA: Diagnosis not present

## 2019-03-16 DIAGNOSIS — R1310 Dysphagia, unspecified: Secondary | ICD-10-CM | POA: Diagnosis not present

## 2019-03-16 DIAGNOSIS — K2270 Barrett's esophagus without dysplasia: Secondary | ICD-10-CM | POA: Diagnosis not present

## 2019-03-21 DIAGNOSIS — K449 Diaphragmatic hernia without obstruction or gangrene: Secondary | ICD-10-CM | POA: Diagnosis not present

## 2019-03-21 DIAGNOSIS — K2270 Barrett's esophagus without dysplasia: Secondary | ICD-10-CM | POA: Diagnosis not present

## 2019-03-21 DIAGNOSIS — K210 Gastro-esophageal reflux disease with esophagitis: Secondary | ICD-10-CM | POA: Diagnosis not present

## 2019-03-21 DIAGNOSIS — R1310 Dysphagia, unspecified: Secondary | ICD-10-CM | POA: Diagnosis not present

## 2019-04-17 DIAGNOSIS — F321 Major depressive disorder, single episode, moderate: Secondary | ICD-10-CM | POA: Diagnosis not present

## 2019-04-17 DIAGNOSIS — Z131 Encounter for screening for diabetes mellitus: Secondary | ICD-10-CM | POA: Diagnosis not present

## 2019-04-17 DIAGNOSIS — F419 Anxiety disorder, unspecified: Secondary | ICD-10-CM | POA: Diagnosis not present

## 2019-04-17 DIAGNOSIS — Z0000 Encounter for general adult medical examination without abnormal findings: Secondary | ICD-10-CM | POA: Diagnosis not present

## 2019-07-06 ENCOUNTER — Other Ambulatory Visit: Payer: Self-pay

## 2019-07-06 ENCOUNTER — Other Ambulatory Visit: Payer: Self-pay | Admitting: Family Medicine

## 2019-07-06 ENCOUNTER — Ambulatory Visit: Payer: Self-pay

## 2019-07-06 DIAGNOSIS — M546 Pain in thoracic spine: Secondary | ICD-10-CM

## 2019-07-17 DIAGNOSIS — R634 Abnormal weight loss: Secondary | ICD-10-CM | POA: Diagnosis not present

## 2019-07-17 DIAGNOSIS — R1084 Generalized abdominal pain: Secondary | ICD-10-CM | POA: Diagnosis not present

## 2019-07-17 DIAGNOSIS — R197 Diarrhea, unspecified: Secondary | ICD-10-CM | POA: Diagnosis not present

## 2019-07-25 DIAGNOSIS — R1084 Generalized abdominal pain: Secondary | ICD-10-CM | POA: Diagnosis not present

## 2019-07-25 DIAGNOSIS — K469 Unspecified abdominal hernia without obstruction or gangrene: Secondary | ICD-10-CM | POA: Diagnosis not present

## 2019-07-31 DIAGNOSIS — R1084 Generalized abdominal pain: Secondary | ICD-10-CM | POA: Diagnosis not present

## 2019-07-31 DIAGNOSIS — K458 Other specified abdominal hernia without obstruction or gangrene: Secondary | ICD-10-CM | POA: Diagnosis not present

## 2019-07-31 DIAGNOSIS — R634 Abnormal weight loss: Secondary | ICD-10-CM | POA: Diagnosis not present

## 2019-07-31 DIAGNOSIS — R197 Diarrhea, unspecified: Secondary | ICD-10-CM | POA: Diagnosis not present

## 2019-08-08 DIAGNOSIS — R634 Abnormal weight loss: Secondary | ICD-10-CM | POA: Diagnosis not present

## 2019-10-02 DIAGNOSIS — K449 Diaphragmatic hernia without obstruction or gangrene: Secondary | ICD-10-CM | POA: Diagnosis not present

## 2019-11-27 ENCOUNTER — Encounter: Payer: Self-pay | Admitting: Allergy

## 2019-11-27 ENCOUNTER — Ambulatory Visit (INDEPENDENT_AMBULATORY_CARE_PROVIDER_SITE_OTHER): Payer: Medicare Other | Admitting: Allergy

## 2019-11-27 ENCOUNTER — Other Ambulatory Visit: Payer: Self-pay

## 2019-11-27 VITALS — BP 122/90 | HR 79 | Temp 98.2°F | Resp 16 | Ht 66.0 in | Wt 139.6 lb

## 2019-11-27 DIAGNOSIS — J3089 Other allergic rhinitis: Secondary | ICD-10-CM | POA: Diagnosis not present

## 2019-11-27 DIAGNOSIS — T7800XA Anaphylactic reaction due to unspecified food, initial encounter: Secondary | ICD-10-CM | POA: Diagnosis not present

## 2019-11-27 DIAGNOSIS — L219 Seborrheic dermatitis, unspecified: Secondary | ICD-10-CM | POA: Diagnosis not present

## 2019-11-27 MED ORDER — AZELASTINE HCL 0.1 % NA SOLN: NASAL | 5 refills | Status: DC

## 2019-11-27 MED ORDER — EPINEPHRINE 0.3 MG/0.3ML IJ SOAJ: INTRAMUSCULAR | 1 refills | Status: DC

## 2019-11-27 NOTE — Patient Instructions (Signed)
Allergies  - environmental allergy skin testing today is positive to tree, grass and weed pollens as well as dust mites  - allergen avoidance measures discussed/handouts provided  - try nasal antihistamine, Astelin 2 sprays each nostril twice a day for control of nasal drainage  - can try Allegra 180mg  daily as needed to see if this oral antihistamine is more effective than the others you have tried in the past  - if medication management is ineffective then consider another course of allergen immunotherapy  Food allergy  - continue avoidance of shellfish and fish in diet  - provided with emergency action plan.  Recommend having access to epinephrine device in case of allergic reaction  Scalp dermatitis  - can try Neutrogenia T-sal or if Head and Shoulders is effective can continue use of this shampoo  Follow-up 3-4 months or sooner if needed

## 2019-11-27 NOTE — Progress Notes (Signed)
New Patient Note  RE: Edwin Hall MRN: [id number] DOB: Apr 21, 1966 Date of Office Visit: 11/27/2019  Referring provider: Nehemiah Settle, MD Primary care provider: Maryella Shivers, MD  Chief Complaint: Nasal drainage  History of present illness: Edwin Hall is a 54 y.o. male presenting today for consultation for allergies.  He moved to [place] about 4 years from [place].  He states he never had any allergy symptoms while [place].  Since he has been here however he reports having constant nasal drainage and needing to blow his nose.  Denies any congestion, ocular symptoms, ear symptoms or sneezing.  He has been on Xyzal for past 4 month but this has not made any difference.  He has tried Claritin, Zyrtec in the past which also was not helpful.  He has tried over-the-counter nasal sprays which also were not helpful. He was on allergen immunotherapy as child.    He avoids all fish and shellfish.  He reports 8 years ago he had a reaction that involved throat swelling after eating crab meat and he ended up in the hospital.  He does not have an epinephrine device.  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  He has no interest in eating these foods.  He denies history of eczema.  However he does report having some itchiness of his upper back typically after work.  He also reports having a flakiness of his scalp and he is using head and shoulders shampoo currently.  He does report childhood history of asthma when he was around 54 years old diagnosed with his last occurrence of symptoms around 54 years old.  He states he has not had any asthma symptoms in adulthood.  Review of systems: Review of Systems  Constitutional: Negative.   HENT: Negative.   Eyes: Negative.   Respiratory: Negative.   Cardiovascular: Negative.   Gastrointestinal: Negative.   Musculoskeletal: Negative.   Skin: Positive for itching.  Neurological: Negative.     All other systems negative unless  noted above in HPI  Past medical history: Past Medical History:  Diagnosis Date  . Food allergy   . Headache   . Hiatal hernia   . Hyperlipidemia   . IBS (irritable bowel syndrome)   . Iron deficiency anemia   . Urticaria     Past surgical history: Past Surgical History:  Procedure Laterality Date  . ABDOMINAL SURGERY  12/2015   pylorus plasty  . APPENDECTOMY  1984  . CHOLECYSTECTOMY  2008  . GASTROSTOMY TUBE PLACEMENT    . HIATAL HERNIA REPAIR  07/2015  . TONSILLECTOMY  1980  . VENTRAL HERNIA REPAIR  2010    Family history:  Family History  Problem Relation Age of Onset  . Parkinson's disease Father   . Heart disease Father   . Angioedema Mother   . Allergic rhinitis Neg Hx   . Asthma Neg Hx   . Eczema Neg Hx   . Immunodeficiency Neg Hx   . Urticaria Neg Hx     Social history: Lives in an apartment with tile flooring with electric heating and window cooling.  No pets in the home.  No concern for water damage, mildew or roaches in the home.  He is an EMT.  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  Medication List: Current Outpatient Medications  Medication Sig Dispense Refill  . Ascorbic Acid (VITAMIN C PO) Take by mouth.    Marland Kitchen aspirin 81 MG EC tablet Take 81 mg  by mouth daily. Swallow whole.    . Cholecalciferol (VITAMIN D3 PO) Take 250 mcg by mouth.    Marland Kitchen CREATINE PO Take by mouth.    . Cyanocobalamin (B-12 PO) Take 1 tablet by mouth daily.    . Ferrous Sulfate (IRON PO) Take 65 mg by mouth.    . Ginkgo Biloba 40 MG TABS Take by mouth.    . levocetirizine (XYZAL) 5 MG tablet Take 5 mg by mouth every evening.    . Multiple Vitamin (MULTIVITAMIN WITH MINERALS) TABS tablet Take 1 tablet by mouth daily.    . Omega-3 Fatty Acids (FISH OIL) 1200 MG CAPS Take by mouth.    . pantoprazole (PROTONIX) 40 MG tablet Take 40 mg by mouth 2 (two) times daily.    . promethazine (PHENERGAN) 25 MG tablet Take 1 tablet (25 mg total) by mouth every 6 (six) hours as needed for nausea or  vomiting. 10 tablet 0  . Zinc 50 MG TABS Take by mouth.     No current facility-administered medications for this visit.    Known medication allergies: Allergies  Allergen Reactions  . Other Anaphylaxis    All seafood   . Shellfish Allergy Anaphylaxis  . Iodine Hives  . Tetracyclines & Related Itching  . Zofran [Ondansetron Hcl] Itching and Rash     Physical examination: Blood pressure 122/90, pulse 79, temperature 98.2 F (36.8 C), temperature source Temporal, resp. rate 16, height 5\' 6"  (1.676 m), weight 139 lb 8.8 oz (63.3 kg), SpO2 97 %.  General: Alert, interactive, in no acute distress. HEENT: PERRLA, TMs pearly gray, turbinates minimally edematous with clear discharge, post-pharynx non erythematous. Neck: Supple without lymphadenopathy. Lungs: Clear to auscultation without wheezing, rhonchi or rales. {no increased work of breathing. CV: Normal S1, S2 without murmurs. Abdomen: Nondistended, nontender. Skin: At the crown of his scalp with a erythematous macule. Extremities:  No clubbing, cyanosis or edema. Neuro:   Grossly intact.  Diagnositics/Labs: Allergy testing: Environmental allergy skin prick testing is positive to blue, mugwort, oak, both dust mites Allergy testing results were read and interpreted by provider, documented by clinical staff.   Assessment and plan:   Allergic rhinitis  - environmental allergy skin testing today is positive to tree, grass and weed pollens as well as dust mites  - allergen avoidance measures discussed/handouts provided  - try nasal antihistamine, Astelin 2 sprays each nostril twice a day for control of nasal drainage  - can try Allegra 180mg  daily as needed to see if this oral antihistamine is more effective than the others you have tried in the past  - if medication management is ineffective then consider another course of allergen immunotherapy  Anaphylaxis due to food  - continue avoidance of shellfish and fish in  diet  - provided with emergency action plan.  Recommend having access to epinephrine device in case of allergic reaction  Scalp dermatitis  -Likely seborrheic dermatitis  - can try Neutrogenia T-sal or if Head and Shoulders is effective can continue use of this shampoo  Follow-up 3-4 months or sooner if needed   I appreciate the opportunity to take part in [facility] care. Please do not hesitate to contact me with questions.  Sincerely,   , MD Allergy/Immunology [facility]

## 2019-12-05 ENCOUNTER — Telehealth: Payer: Self-pay | Admitting: Allergy

## 2019-12-05 ENCOUNTER — Other Ambulatory Visit: Payer: Self-pay

## 2019-12-05 MED ORDER — IPRATROPIUM BROMIDE 0.06 % NA SOLN: NASAL | 5 refills | Status: DC

## 2019-12-05 NOTE — Telephone Encounter (Signed)
He can definitely begin allergy immunotherapy. Can you please send an allergen immunotherapy packet to the address listed for him? Please have him call with any questions about the allergy packet. Please let him know to make an appointment for 2-3 weeks from not for his first allergy injection if interested.

## 2019-12-05 NOTE — Telephone Encounter (Signed)
Patient is using all medication as directed and avoiding all allergens. Patient states that he has tried Flonase before and had no relief what so ever. Patient is willing to try Atrovent and see if it helps. Patient is wondering if he is a canidate for Allergy Injections. States if the Atrovent does not work, he would like to discuss starting injections. Calling in Atrovent to [facility] in [place]. Says he will call with an update.

## 2019-12-05 NOTE — Telephone Encounter (Signed)
Edwin Hall called in and states he has been taking his medications as directed and nothing is helping.  Edwin Hall states his nose is still "running like crazy" and would like to know if there is anything else that can give him relief.  Please advise.

## 2019-12-05 NOTE — Telephone Encounter (Signed)
Can you please make sure he is using azelastine nasal spray and continuing to utilize avoidance measures directed toward dust mites and pollens, especially dust mite covers. Please have him begin Flonase 2 sprays in each nostril once a day. We can also add Atrovent nasal spray 2 sprays in each nostril twice a day (can you please order the atrovent). Thank you. Please have him call back if this regimen is still not working. Thank you

## 2019-12-05 NOTE — Telephone Encounter (Signed)
Left a voicemail for patient to call the office.

## 2019-12-06 ENCOUNTER — Ambulatory Visit: Payer: Medicare Other | Admitting: Allergy and Immunology

## 2019-12-07 NOTE — Telephone Encounter (Signed)
This patient is interested in allergen immunotherapy

## 2019-12-07 NOTE — Telephone Encounter (Signed)
Ok thanks.  Will await notification to order ITX.

## 2019-12-07 NOTE — Telephone Encounter (Signed)
Immunotherapy packet mailed. Called and left patient a voicemail to give me a call back.

## 2019-12-10 DIAGNOSIS — K449 Diaphragmatic hernia without obstruction or gangrene: Secondary | ICD-10-CM | POA: Diagnosis not present

## 2019-12-12 DIAGNOSIS — E7849 Other hyperlipidemia: Secondary | ICD-10-CM | POA: Diagnosis not present

## 2019-12-12 DIAGNOSIS — F321 Major depressive disorder, single episode, moderate: Secondary | ICD-10-CM | POA: Diagnosis not present

## 2019-12-12 DIAGNOSIS — D509 Iron deficiency anemia, unspecified: Secondary | ICD-10-CM | POA: Diagnosis not present

## 2019-12-12 DIAGNOSIS — F419 Anxiety disorder, unspecified: Secondary | ICD-10-CM | POA: Diagnosis not present

## 2019-12-15 DIAGNOSIS — Z1159 Encounter for screening for other viral diseases: Secondary | ICD-10-CM | POA: Diagnosis not present

## 2019-12-21 DIAGNOSIS — R1310 Dysphagia, unspecified: Secondary | ICD-10-CM | POA: Diagnosis not present

## 2019-12-21 DIAGNOSIS — Z9049 Acquired absence of other specified parts of digestive tract: Secondary | ICD-10-CM | POA: Diagnosis not present

## 2019-12-21 DIAGNOSIS — K2270 Barrett's esophagus without dysplasia: Secondary | ICD-10-CM | POA: Diagnosis not present

## 2019-12-21 DIAGNOSIS — K219 Gastro-esophageal reflux disease without esophagitis: Secondary | ICD-10-CM | POA: Diagnosis not present

## 2019-12-21 DIAGNOSIS — Z9889 Other specified postprocedural states: Secondary | ICD-10-CM | POA: Diagnosis not present

## 2019-12-21 DIAGNOSIS — K449 Diaphragmatic hernia without obstruction or gangrene: Secondary | ICD-10-CM | POA: Diagnosis not present

## 2019-12-21 DIAGNOSIS — K3189 Other diseases of stomach and duodenum: Secondary | ICD-10-CM | POA: Diagnosis not present

## 2019-12-21 DIAGNOSIS — K222 Esophageal obstruction: Secondary | ICD-10-CM | POA: Diagnosis not present

## 2019-12-21 DIAGNOSIS — K3184 Gastroparesis: Secondary | ICD-10-CM | POA: Diagnosis not present

## 2019-12-21 DIAGNOSIS — K2970 Gastritis, unspecified, without bleeding: Secondary | ICD-10-CM | POA: Diagnosis not present

## 2019-12-21 DIAGNOSIS — R12 Heartburn: Secondary | ICD-10-CM | POA: Diagnosis not present

## 2019-12-21 DIAGNOSIS — R140 Abdominal distension (gaseous): Secondary | ICD-10-CM | POA: Diagnosis not present

## 2020-01-14 DIAGNOSIS — K449 Diaphragmatic hernia without obstruction or gangrene: Secondary | ICD-10-CM | POA: Diagnosis not present

## 2020-02-04 DIAGNOSIS — K449 Diaphragmatic hernia without obstruction or gangrene: Secondary | ICD-10-CM | POA: Diagnosis not present

## 2020-03-10 DIAGNOSIS — Z1159 Encounter for screening for other viral diseases: Secondary | ICD-10-CM | POA: Diagnosis not present

## 2020-03-17 DIAGNOSIS — Z79899 Other long term (current) drug therapy: Secondary | ICD-10-CM | POA: Diagnosis not present

## 2020-03-17 DIAGNOSIS — K439 Ventral hernia without obstruction or gangrene: Secondary | ICD-10-CM | POA: Diagnosis not present

## 2020-03-17 DIAGNOSIS — D62 Acute posthemorrhagic anemia: Secondary | ICD-10-CM | POA: Diagnosis not present

## 2020-03-17 DIAGNOSIS — K219 Gastro-esophageal reflux disease without esophagitis: Secondary | ICD-10-CM | POA: Diagnosis not present

## 2020-03-17 DIAGNOSIS — Z87892 Personal history of anaphylaxis: Secondary | ICD-10-CM | POA: Diagnosis not present

## 2020-03-17 DIAGNOSIS — Z823 Family history of stroke: Secondary | ICD-10-CM | POA: Diagnosis not present

## 2020-03-17 DIAGNOSIS — Z791 Long term (current) use of non-steroidal anti-inflammatories (NSAID): Secondary | ICD-10-CM | POA: Diagnosis not present

## 2020-03-17 DIAGNOSIS — G8918 Other acute postprocedural pain: Secondary | ICD-10-CM | POA: Diagnosis not present

## 2020-03-17 DIAGNOSIS — K5900 Constipation, unspecified: Secondary | ICD-10-CM | POA: Diagnosis not present

## 2020-03-17 DIAGNOSIS — J9811 Atelectasis: Secondary | ICD-10-CM | POA: Diagnosis not present

## 2020-03-17 DIAGNOSIS — K449 Diaphragmatic hernia without obstruction or gangrene: Secondary | ICD-10-CM | POA: Diagnosis not present

## 2020-03-17 DIAGNOSIS — Z9889 Other specified postprocedural states: Secondary | ICD-10-CM | POA: Diagnosis not present

## 2020-03-17 DIAGNOSIS — I444 Left anterior fascicular block: Secondary | ICD-10-CM | POA: Diagnosis not present

## 2020-03-17 DIAGNOSIS — R0789 Other chest pain: Secondary | ICD-10-CM | POA: Diagnosis not present

## 2020-03-17 DIAGNOSIS — J9383 Other pneumothorax: Secondary | ICD-10-CM | POA: Diagnosis not present

## 2020-03-17 DIAGNOSIS — Z01810 Encounter for preprocedural cardiovascular examination: Secondary | ICD-10-CM | POA: Diagnosis not present

## 2020-03-17 DIAGNOSIS — Z881 Allergy status to other antibiotic agents status: Secondary | ICD-10-CM | POA: Diagnosis not present

## 2020-03-17 DIAGNOSIS — K2270 Barrett's esophagus without dysplasia: Secondary | ICD-10-CM | POA: Diagnosis present

## 2020-03-17 DIAGNOSIS — R1310 Dysphagia, unspecified: Secondary | ICD-10-CM | POA: Diagnosis not present

## 2020-03-17 DIAGNOSIS — Z888 Allergy status to other drugs, medicaments and biological substances status: Secondary | ICD-10-CM | POA: Diagnosis not present

## 2020-03-17 DIAGNOSIS — Z91018 Allergy to other foods: Secondary | ICD-10-CM | POA: Diagnosis not present

## 2020-03-17 DIAGNOSIS — G8922 Chronic post-thoracotomy pain: Secondary | ICD-10-CM | POA: Diagnosis not present

## 2020-03-17 DIAGNOSIS — Z7982 Long term (current) use of aspirin: Secondary | ICD-10-CM | POA: Diagnosis not present

## 2020-03-17 DIAGNOSIS — J9382 Other air leak: Secondary | ICD-10-CM | POA: Diagnosis not present

## 2020-03-17 DIAGNOSIS — R339 Retention of urine, unspecified: Secondary | ICD-10-CM | POA: Diagnosis not present

## 2020-03-17 DIAGNOSIS — Z91013 Allergy to seafood: Secondary | ICD-10-CM | POA: Diagnosis not present

## 2020-03-17 DIAGNOSIS — R9431 Abnormal electrocardiogram [ECG] [EKG]: Secondary | ICD-10-CM | POA: Diagnosis not present

## 2020-04-01 ENCOUNTER — Ambulatory Visit: Payer: Medicare Other | Admitting: Allergy

## 2020-04-01 DIAGNOSIS — Z7982 Long term (current) use of aspirin: Secondary | ICD-10-CM | POA: Diagnosis not present

## 2020-04-01 DIAGNOSIS — K3189 Other diseases of stomach and duodenum: Secondary | ICD-10-CM | POA: Diagnosis not present

## 2020-04-01 DIAGNOSIS — K2100 Gastro-esophageal reflux disease with esophagitis, without bleeding: Secondary | ICD-10-CM | POA: Diagnosis not present

## 2020-04-01 DIAGNOSIS — K22719 Barrett's esophagus with dysplasia, unspecified: Secondary | ICD-10-CM | POA: Diagnosis not present

## 2020-04-01 DIAGNOSIS — K449 Diaphragmatic hernia without obstruction or gangrene: Secondary | ICD-10-CM | POA: Diagnosis not present

## 2020-04-01 DIAGNOSIS — Z48815 Encounter for surgical aftercare following surgery on the digestive system: Secondary | ICD-10-CM | POA: Diagnosis not present

## 2020-04-03 DIAGNOSIS — G8918 Other acute postprocedural pain: Secondary | ICD-10-CM | POA: Diagnosis not present

## 2020-04-03 DIAGNOSIS — Z7982 Long term (current) use of aspirin: Secondary | ICD-10-CM | POA: Diagnosis not present

## 2020-04-03 DIAGNOSIS — K2100 Gastro-esophageal reflux disease with esophagitis, without bleeding: Secondary | ICD-10-CM | POA: Diagnosis not present

## 2020-04-03 DIAGNOSIS — S2242XA Multiple fractures of ribs, left side, initial encounter for closed fracture: Secondary | ICD-10-CM | POA: Diagnosis not present

## 2020-04-03 DIAGNOSIS — R748 Abnormal levels of other serum enzymes: Secondary | ICD-10-CM | POA: Diagnosis not present

## 2020-04-03 DIAGNOSIS — K449 Diaphragmatic hernia without obstruction or gangrene: Secondary | ICD-10-CM | POA: Diagnosis not present

## 2020-04-03 DIAGNOSIS — Z48815 Encounter for surgical aftercare following surgery on the digestive system: Secondary | ICD-10-CM | POA: Diagnosis not present

## 2020-04-03 DIAGNOSIS — R7989 Other specified abnormal findings of blood chemistry: Secondary | ICD-10-CM | POA: Diagnosis not present

## 2020-04-03 DIAGNOSIS — K3189 Other diseases of stomach and duodenum: Secondary | ICD-10-CM | POA: Diagnosis not present

## 2020-04-03 DIAGNOSIS — R109 Unspecified abdominal pain: Secondary | ICD-10-CM | POA: Diagnosis not present

## 2020-04-03 DIAGNOSIS — K22719 Barrett's esophagus with dysplasia, unspecified: Secondary | ICD-10-CM | POA: Diagnosis not present

## 2020-04-08 DIAGNOSIS — K22719 Barrett's esophagus with dysplasia, unspecified: Secondary | ICD-10-CM | POA: Diagnosis not present

## 2020-04-08 DIAGNOSIS — Z48815 Encounter for surgical aftercare following surgery on the digestive system: Secondary | ICD-10-CM | POA: Diagnosis not present

## 2020-04-08 DIAGNOSIS — K2100 Gastro-esophageal reflux disease with esophagitis, without bleeding: Secondary | ICD-10-CM | POA: Diagnosis not present

## 2020-04-08 DIAGNOSIS — K3189 Other diseases of stomach and duodenum: Secondary | ICD-10-CM | POA: Diagnosis not present

## 2020-04-08 DIAGNOSIS — K449 Diaphragmatic hernia without obstruction or gangrene: Secondary | ICD-10-CM | POA: Diagnosis not present

## 2020-04-08 DIAGNOSIS — Z7982 Long term (current) use of aspirin: Secondary | ICD-10-CM | POA: Diagnosis not present

## 2020-04-10 DIAGNOSIS — Z48815 Encounter for surgical aftercare following surgery on the digestive system: Secondary | ICD-10-CM | POA: Diagnosis not present

## 2020-04-10 DIAGNOSIS — Z7982 Long term (current) use of aspirin: Secondary | ICD-10-CM | POA: Diagnosis not present

## 2020-04-10 DIAGNOSIS — K3189 Other diseases of stomach and duodenum: Secondary | ICD-10-CM | POA: Diagnosis not present

## 2020-04-10 DIAGNOSIS — K449 Diaphragmatic hernia without obstruction or gangrene: Secondary | ICD-10-CM | POA: Diagnosis not present

## 2020-04-10 DIAGNOSIS — K2100 Gastro-esophageal reflux disease with esophagitis, without bleeding: Secondary | ICD-10-CM | POA: Diagnosis not present

## 2020-04-10 DIAGNOSIS — K22719 Barrett's esophagus with dysplasia, unspecified: Secondary | ICD-10-CM | POA: Diagnosis not present

## 2020-04-12 DIAGNOSIS — K219 Gastro-esophageal reflux disease without esophagitis: Secondary | ICD-10-CM | POA: Diagnosis not present

## 2020-04-12 DIAGNOSIS — B3749 Other urogenital candidiasis: Secondary | ICD-10-CM | POA: Diagnosis not present

## 2020-04-12 DIAGNOSIS — R112 Nausea with vomiting, unspecified: Secondary | ICD-10-CM | POA: Diagnosis not present

## 2020-04-12 DIAGNOSIS — R109 Unspecified abdominal pain: Secondary | ICD-10-CM | POA: Diagnosis not present

## 2020-04-12 DIAGNOSIS — R1084 Generalized abdominal pain: Secondary | ICD-10-CM | POA: Diagnosis not present

## 2020-04-12 DIAGNOSIS — Z79891 Long term (current) use of opiate analgesic: Secondary | ICD-10-CM | POA: Diagnosis not present

## 2020-04-12 DIAGNOSIS — R519 Headache, unspecified: Secondary | ICD-10-CM | POA: Diagnosis not present

## 2020-04-12 DIAGNOSIS — G8918 Other acute postprocedural pain: Secondary | ICD-10-CM | POA: Diagnosis not present

## 2020-04-12 DIAGNOSIS — Z79899 Other long term (current) drug therapy: Secondary | ICD-10-CM | POA: Diagnosis not present

## 2020-04-13 DIAGNOSIS — R109 Unspecified abdominal pain: Secondary | ICD-10-CM | POA: Diagnosis not present

## 2020-04-15 DIAGNOSIS — Z7982 Long term (current) use of aspirin: Secondary | ICD-10-CM | POA: Diagnosis not present

## 2020-04-15 DIAGNOSIS — K449 Diaphragmatic hernia without obstruction or gangrene: Secondary | ICD-10-CM | POA: Diagnosis not present

## 2020-04-15 DIAGNOSIS — Z48815 Encounter for surgical aftercare following surgery on the digestive system: Secondary | ICD-10-CM | POA: Diagnosis not present

## 2020-04-15 DIAGNOSIS — K22719 Barrett's esophagus with dysplasia, unspecified: Secondary | ICD-10-CM | POA: Diagnosis not present

## 2020-04-15 DIAGNOSIS — K3189 Other diseases of stomach and duodenum: Secondary | ICD-10-CM | POA: Diagnosis not present

## 2020-04-15 DIAGNOSIS — K2100 Gastro-esophageal reflux disease with esophagitis, without bleeding: Secondary | ICD-10-CM | POA: Diagnosis not present

## 2020-04-20 DIAGNOSIS — R1012 Left upper quadrant pain: Secondary | ICD-10-CM | POA: Diagnosis not present

## 2020-04-20 DIAGNOSIS — K449 Diaphragmatic hernia without obstruction or gangrene: Secondary | ICD-10-CM | POA: Diagnosis not present

## 2020-04-20 DIAGNOSIS — R079 Chest pain, unspecified: Secondary | ICD-10-CM | POA: Diagnosis not present

## 2020-04-20 DIAGNOSIS — R112 Nausea with vomiting, unspecified: Secondary | ICD-10-CM | POA: Diagnosis not present

## 2020-04-20 DIAGNOSIS — R1013 Epigastric pain: Secondary | ICD-10-CM | POA: Diagnosis not present

## 2020-04-20 DIAGNOSIS — J9383 Other pneumothorax: Secondary | ICD-10-CM | POA: Diagnosis not present

## 2020-04-20 DIAGNOSIS — K5900 Constipation, unspecified: Secondary | ICD-10-CM | POA: Diagnosis not present

## 2020-04-20 DIAGNOSIS — R0789 Other chest pain: Secondary | ICD-10-CM | POA: Diagnosis not present

## 2020-04-20 DIAGNOSIS — R110 Nausea: Secondary | ICD-10-CM | POA: Diagnosis not present

## 2020-04-20 DIAGNOSIS — J939 Pneumothorax, unspecified: Secondary | ICD-10-CM | POA: Diagnosis not present

## 2020-04-20 DIAGNOSIS — R1084 Generalized abdominal pain: Secondary | ICD-10-CM | POA: Diagnosis not present

## 2020-04-21 DIAGNOSIS — K449 Diaphragmatic hernia without obstruction or gangrene: Secondary | ICD-10-CM | POA: Diagnosis not present

## 2020-04-21 DIAGNOSIS — K22719 Barrett's esophagus with dysplasia, unspecified: Secondary | ICD-10-CM | POA: Diagnosis not present

## 2020-04-21 DIAGNOSIS — K3189 Other diseases of stomach and duodenum: Secondary | ICD-10-CM | POA: Diagnosis not present

## 2020-04-21 DIAGNOSIS — K2100 Gastro-esophageal reflux disease with esophagitis, without bleeding: Secondary | ICD-10-CM | POA: Diagnosis not present

## 2020-04-21 DIAGNOSIS — Z7982 Long term (current) use of aspirin: Secondary | ICD-10-CM | POA: Diagnosis not present

## 2020-04-21 DIAGNOSIS — Z48815 Encounter for surgical aftercare following surgery on the digestive system: Secondary | ICD-10-CM | POA: Diagnosis not present

## 2020-04-21 DIAGNOSIS — J939 Pneumothorax, unspecified: Secondary | ICD-10-CM | POA: Diagnosis not present

## 2020-04-22 DIAGNOSIS — Z8719 Personal history of other diseases of the digestive system: Secondary | ICD-10-CM | POA: Diagnosis not present

## 2020-04-22 DIAGNOSIS — K449 Diaphragmatic hernia without obstruction or gangrene: Secondary | ICD-10-CM | POA: Diagnosis not present

## 2020-04-22 DIAGNOSIS — I700 Atherosclerosis of aorta: Secondary | ICD-10-CM | POA: Diagnosis not present

## 2020-04-22 DIAGNOSIS — Z7689 Persons encountering health services in other specified circumstances: Secondary | ICD-10-CM | POA: Diagnosis not present

## 2020-04-22 DIAGNOSIS — Z9889 Other specified postprocedural states: Secondary | ICD-10-CM | POA: Diagnosis not present

## 2020-04-23 DIAGNOSIS — K449 Diaphragmatic hernia without obstruction or gangrene: Secondary | ICD-10-CM | POA: Diagnosis not present

## 2020-04-23 DIAGNOSIS — J9383 Other pneumothorax: Secondary | ICD-10-CM | POA: Diagnosis not present

## 2020-04-29 DIAGNOSIS — K22719 Barrett's esophagus with dysplasia, unspecified: Secondary | ICD-10-CM | POA: Diagnosis not present

## 2020-04-29 DIAGNOSIS — Z7982 Long term (current) use of aspirin: Secondary | ICD-10-CM | POA: Diagnosis not present

## 2020-04-29 DIAGNOSIS — K2100 Gastro-esophageal reflux disease with esophagitis, without bleeding: Secondary | ICD-10-CM | POA: Diagnosis not present

## 2020-04-29 DIAGNOSIS — K449 Diaphragmatic hernia without obstruction or gangrene: Secondary | ICD-10-CM | POA: Diagnosis not present

## 2020-04-29 DIAGNOSIS — Z48815 Encounter for surgical aftercare following surgery on the digestive system: Secondary | ICD-10-CM | POA: Diagnosis not present

## 2020-04-29 DIAGNOSIS — K3189 Other diseases of stomach and duodenum: Secondary | ICD-10-CM | POA: Diagnosis not present

## 2020-05-01 ENCOUNTER — Other Ambulatory Visit: Payer: Self-pay

## 2020-05-01 ENCOUNTER — Emergency Department (HOSPITAL_COMMUNITY)
Admission: EM | Admit: 2020-05-01 | Discharge: 2020-05-02 | Disposition: A | Discharge status: Home / Self Care | Payer: Medicare Other | Attending: Emergency Medicine | Admitting: Emergency Medicine

## 2020-05-01 ENCOUNTER — Encounter (HOSPITAL_COMMUNITY): Payer: Self-pay

## 2020-05-01 DIAGNOSIS — Z48815 Encounter for surgical aftercare following surgery on the digestive system: Secondary | ICD-10-CM | POA: Diagnosis not present

## 2020-05-01 DIAGNOSIS — K22719 Barrett's esophagus with dysplasia, unspecified: Secondary | ICD-10-CM | POA: Diagnosis not present

## 2020-05-01 DIAGNOSIS — K3189 Other diseases of stomach and duodenum: Secondary | ICD-10-CM | POA: Diagnosis not present

## 2020-05-01 DIAGNOSIS — K449 Diaphragmatic hernia without obstruction or gangrene: Secondary | ICD-10-CM | POA: Diagnosis not present

## 2020-05-01 DIAGNOSIS — K2100 Gastro-esophageal reflux disease with esophagitis, without bleeding: Secondary | ICD-10-CM | POA: Diagnosis not present

## 2020-05-01 DIAGNOSIS — R112 Nausea with vomiting, unspecified: Secondary | ICD-10-CM | POA: Diagnosis not present

## 2020-05-01 DIAGNOSIS — R197 Diarrhea, unspecified: Secondary | ICD-10-CM | POA: Diagnosis not present

## 2020-05-01 DIAGNOSIS — Z7982 Long term (current) use of aspirin: Secondary | ICD-10-CM | POA: Insufficient documentation

## 2020-05-01 DIAGNOSIS — R1111 Vomiting without nausea: Secondary | ICD-10-CM | POA: Diagnosis present

## 2020-05-01 MED ORDER — SODIUM CHLORIDE 0.9% FLUSH: 3.0000 mL | Freq: Once | INTRAVENOUS | Status: DC

## 2020-05-01 NOTE — ED Triage Notes (Addendum)
Paraesophageal hernia repair diaphragmatic hernia repair on 03/17/20. Pt sts n/v/d and chills since 4 days and unable to keep anything down. Pt sts weight loss of 26 lbs since surgical repair. PRN phenergan not helping. Pt seen 1 week ago at [facility] for left sided pneumothorax and still having shob/ pain on the left side. Fever of 102 at home today last took tylenol at 1200 today.

## 2020-05-02 ENCOUNTER — Emergency Department (HOSPITAL_COMMUNITY): Payer: Medicare Other

## 2020-05-02 DIAGNOSIS — R112 Nausea with vomiting, unspecified: Secondary | ICD-10-CM | POA: Diagnosis not present

## 2020-05-02 LAB — CBC WITH DIFFERENTIAL/PLATELET
Abs Immature Granulocytes: 0.01 10*3/uL (ref 0.00–0.07)
Basophils Absolute: 0.1 10*3/uL (ref 0.0–0.1)
Basophils Relative: 1 %
Eosinophils Absolute: 0.1 10*3/uL (ref 0.0–0.5)
Eosinophils Relative: 1 %
HCT: 41.1 % (ref 39.0–52.0)
Hemoglobin: 13.9 g/dL (ref 13.0–17.0)
Immature Granulocytes: 0 %
Lymphocytes Relative: 41 %
Lymphs Abs: 2.5 10*3/uL (ref 0.7–4.0)
MCH: 31.6 pg (ref 26.0–34.0)
MCHC: 33.8 g/dL (ref 30.0–36.0)
MCV: 93.4 fL (ref 80.0–100.0)
Monocytes Absolute: 0.5 10*3/uL (ref 0.1–1.0)
Monocytes Relative: 8 %
Neutro Abs: 2.9 10*3/uL (ref 1.7–7.7)
Neutrophils Relative %: 49 %
Platelets: 243 10*3/uL (ref 150–400)
RBC: 4.4 MIL/uL (ref 4.22–5.81)
RDW: 11.9 % (ref 11.5–15.5)
WBC: 6 10*3/uL (ref 4.0–10.5)
nRBC: 0 % (ref 0.0–0.2)

## 2020-05-02 LAB — COMPREHENSIVE METABOLIC PANEL
ALT: 22 U/L (ref 0–44)
AST: 20 U/L (ref 15–41)
Albumin: 4.1 g/dL (ref 3.5–5.0)
Alkaline Phosphatase: 76 U/L (ref 38–126)
Anion gap: 10 (ref 5–15)
BUN: 13 mg/dL (ref 6–20)
CO2: 23 mmol/L (ref 22–32)
Calcium: 9 mg/dL (ref 8.9–10.3)
Chloride: 104 mmol/L (ref 98–111)
Creatinine, Ser: 0.76 mg/dL (ref 0.61–1.24)
GFR calc Af Amer: >60 mL/min (ref >60)
GFR calc non Af Amer: >60 mL/min (ref >60)
Glucose, Bld: 102 mg/dL — ABNORMAL HIGH (ref 70–99)
Potassium: 3.7 mmol/L (ref 3.5–5.1)
Sodium: 137 mmol/L (ref 135–145)
Total Bilirubin: 0.7 mg/dL (ref 0.3–1.2)
Total Protein: 6.6 g/dL (ref 6.5–8.1)

## 2020-05-02 LAB — URINALYSIS, ROUTINE W REFLEX MICROSCOPIC
Bilirubin Urine: NEGATIVE
Glucose, UA: NEGATIVE mg/dL
Hgb urine dipstick: NEGATIVE
Ketones, ur: 20 mg/dL — AB
Leukocytes,Ua: NEGATIVE
Nitrite: NEGATIVE
Protein, ur: NEGATIVE mg/dL
Specific Gravity, Urine: 1.016 (ref 1.005–1.030)
pH: 6 (ref 5.0–8.0)

## 2020-05-02 LAB — LIPASE, BLOOD: Lipase: 82 U/L — ABNORMAL HIGH (ref 11–51)

## 2020-05-02 MED ORDER — PROCHLORPERAZINE 25 MG RE SUPP
25.0000 mg | Freq: Two times a day (BID) | RECTAL | 0 refills | Status: DC | PRN
Start: 2020-05-02 — End: 2020-05-16

## 2020-05-02 MED ORDER — METOCLOPRAMIDE HCL 5 MG/ML IJ SOLN
10.0000 mg | Freq: Once | INTRAMUSCULAR | Status: AC
  Administered 2020-05-02: 10 mg via INTRAVENOUS
  Filled 2020-05-02: qty 2

## 2020-05-02 MED ORDER — SODIUM CHLORIDE 0.9 % IV BOLUS
1000.0000 mL | Freq: Once | INTRAVENOUS | Status: AC
  Administered 2020-05-02: 1000 mL via INTRAVENOUS

## 2020-05-02 MED ORDER — MORPHINE SULFATE (PF) 4 MG/ML IV SOLN
4.0000 mg | Freq: Once | INTRAVENOUS | Status: AC
  Administered 2020-05-02: 4 mg via INTRAVENOUS
  Filled 2020-05-02: qty 1

## 2020-05-02 MED ORDER — OXYCODONE-ACETAMINOPHEN 5-325 MG PO TABS: 1.0000 | ORAL_TABLET | ORAL | 0 refills | Status: DC | PRN

## 2020-05-02 MED ORDER — PROCHLORPERAZINE EDISYLATE 10 MG/2ML IJ SOLN
10.0000 mg | Freq: Once | INTRAMUSCULAR | Status: AC
  Administered 2020-05-02: 10 mg via INTRAVENOUS
  Filled 2020-05-02: qty 2

## 2020-05-02 MED ORDER — PROCHLORPERAZINE MALEATE 10 MG PO TABS
10.0000 mg | ORAL_TABLET | Freq: Four times a day (QID) | ORAL | 0 refills | Status: DC | PRN
Start: 2020-05-02 — End: 2020-05-16

## 2020-05-02 NOTE — Discharge Instructions (Addendum)
Take loperamide (Imodium AD) as needed for diarrhea.  Return if you are having any problems. 

## 2020-05-02 NOTE — ED Provider Notes (Signed)
[facility]-EMERGENCY DEPT Provider Note   CSN: [id number] Arrival date & time: 05/01/20  2217   History Chief Complaint  Patient presents with   Emesis    Edwin Hall is a 54 y.o. male.  The history is provided by the patient.  Emesis He has history of hyperlipidemia and comes in because of vomiting and diarrhea.  He had hiatus hernia repair on 03/17/2020 and has had 26 pound weight loss since then.  1 week ago, he was seen at [facility] for a left-sided pneumothorax which was treated with observation.  5 days ago, he started vomiting.  He has been vomiting 5 or more times a day and has not been able to hold anything down.  3 days ago, he started having diarrhea, also 4-5 times a day.  He denies any blood in stool or emesis.  Tonight, he had chills at home and developed a fever to 102.0.  He did take a dose of acetaminophen.  At home he has been taking promethazine for nausea which makes him sleepy, but does not help the nausea.  He denies any sick contacts.  He is complaining of some abdominal soreness but no true abdominal pain.  Past Medical History:  Diagnosis Date   Food allergy    Headache    Hiatal hernia    Hyperlipidemia    IBS (irritable bowel syndrome)    Iron deficiency anemia    Urticaria     There are no problems to display for this patient.   Past Surgical History:  Procedure Laterality Date   ABDOMINAL SURGERY  12/2015   pylorus plasty   APPENDECTOMY  1984   CHOLECYSTECTOMY  2008   GASTROSTOMY TUBE PLACEMENT     HIATAL HERNIA REPAIR  07/2015   TONSILLECTOMY  1980   VENTRAL HERNIA REPAIR  2010       Family History  Problem Relation Age of Onset   Parkinson's disease Father    Heart disease Father    Angioedema Mother    Allergic rhinitis Neg Hx    Asthma Neg Hx    Eczema Neg Hx    Immunodeficiency Neg Hx    Urticaria Neg Hx     Social History   Tobacco Use   Smoking status:  Never Smoker   Smokeless tobacco: Never Used  Vaping Use   Vaping Use: Never used  Substance Use Topics   Alcohol use: No   Drug use: No    Home Medications Prior to Admission medications   Medication Sig Start Date End Date Taking? Authorizing Provider  Ascorbic Acid (VITAMIN C PO) Take by mouth.    [provider]  aspirin 81 MG EC tablet Take 81 mg by mouth daily. Swallow whole.    [provider]  azelastine (ASTELIN) 0.1 % nasal spray 2 sprays per nostril twice a day for control of nasal drainage. 11/27/19   Marcelyn Bruins, MD  Cholecalciferol (VITAMIN D3 PO) Take 250 mcg by mouth.    [provider]  CREATINE PO Take by mouth.    [provider]  EPINEPHrine 0.3 mg/0.3 mL IJ SOAJ injection Use as directed for severe allergic reactions 11/27/19   Marcelyn Bruins, MD  Ferrous Sulfate (IRON PO) Take 65 mg by mouth.    [provider]  Ginkgo Biloba 40 MG TABS Take by mouth.    [provider]  ipratropium (ATROVENT) 0.06 % nasal spray Use two sprays in each  nostril twice daily as directed. 12/05/19   Dara Hoyer, FNP  levocetirizine (XYZAL) 5 MG tablet Take 5 mg by mouth every evening.    [provider]  Multiple Vitamin (MULTIVITAMIN WITH MINERALS) TABS tablet Take 1 tablet by mouth daily.    [provider]  Omega-3 Fatty Acids (FISH OIL) 1200 MG CAPS Take by mouth.    [provider]  pantoprazole (PROTONIX) 40 MG tablet Take 40 mg by mouth 2 (two) times daily. 09/10/19   [provider]  Zinc 50 MG TABS Take by mouth.    [provider]    Allergies    Other, Shellfish allergy, Iodine, Tetracyclines & related, and Zofran [ondansetron hcl]  Review of Systems   Review of Systems  Gastrointestinal: Positive for vomiting.  All other systems reviewed and are negative.   Physical Exam Updated Vital Signs BP 125/84    Pulse 94    Temp 98.3 F (36.8 C)  (Oral)    Resp 18    Ht 5\' 8"  (1.727 m)    Wt 53.1 kg    SpO2 100%    BMI 17.79 kg/m   Physical Exam Vitals and nursing note reviewed.   54 year old male, resting comfortably and in no acute distress. Vital signs are normal. Oxygen saturation is 100%, which is normal. Head is normocephalic and atraumatic. PERRLA, EOMI. Oropharynx is clear. Neck is nontender and supple without adenopathy or JVD. Back is nontender and there is no CVA tenderness. Lungs are clear without rales, wheezes, or rhonchi.  Breath sounds on the left are slightly diminished. Chest is nontender. Heart has regular rate and rhythm with 3/6 harsh systolic ejection murmur best heard at the cardiac apex. Abdomen is soft, flat, nontender without masses or hepatosplenomegaly and peristalsis is hypoactive. Extremities have no cyanosis or edema, full range of motion is present. Skin is warm and dry without rash. Neurologic: Mental status is normal, cranial nerves are intact, there are no motor or sensory deficits.  ED Results / Procedures / Treatments   Labs (all labs ordered are listed, but only abnormal results are displayed) Labs Reviewed  LIPASE, BLOOD - Abnormal; Notable for the following components:      Result Value   Lipase 82 (*)    All other components within normal limits  COMPREHENSIVE METABOLIC PANEL - Abnormal; Notable for the following components:   Glucose, Bld 102 (*)    All other components within normal limits  URINALYSIS, ROUTINE W REFLEX MICROSCOPIC - Abnormal; Notable for the following components:   Color, Urine AMBER (*)    Ketones, ur 20 (*)    All other components within normal limits  CBC WITH DIFFERENTIAL/PLATELET   Radiology DG Chest Port 1 View  Result Date: 05/02/2020 CLINICAL DATA:  Fever, epigastric pain, shortness of breath EXAM: PORTABLE CHEST 1 VIEW COMPARISON:  04/21/2020 FINDINGS: There is small left pleural effusion with left base atelectasis. Previously seen left apical  pneumothorax no longer visualized. Heart is normal size. Right lung is clear. IMPRESSION: Small left pleural effusion with left base atelectasis, slightly increased since prior study. Electronically Signed   By: Rolm Baptise M.D.   On: 05/02/2020 00:49    Procedures Procedures  1:27 AM Because of inability to draw blood from any of his arm veins, right femoral venipuncture done for blood collection.  Patient tolerated procedure well.  Medications Ordered in ED Medications  sodium chloride flush (NS) 0.9 % injection 3 mL (3 mLs Intravenous  Not Given 05/02/20 0300)  sodium chloride 0.9 % bolus 1,000 mL (1,000 mLs Intravenous New Bag/Given 05/02/20 0045)  metoCLOPramide (REGLAN) injection 10 mg (10 mg Intravenous Given 05/02/20 0044)  morphine 4 MG/ML injection 4 mg (4 mg Intravenous Given 05/02/20 0143)  prochlorperazine (COMPAZINE) injection 10 mg (10 mg Intravenous Given 05/02/20 0143)    ED Course  I have reviewed the triage vital signs and the nursing notes.  Pertinent labs & imaging results that were available during my care of the patient were reviewed by me and considered in my medical decision making (see chart for details).  MDM Rules/Calculators/A&P Nausea, vomiting, diarrhea of uncertain cause.  Patient appears thin but does not appear toxic.  Timeframe is somewhat extended for typical gastroenteritis.'s history of fever at home with normal temperature here, no signs of sepsis.  History of recent pneumothorax.  Will check chest x-ray and screening labs, give IV fluids and IV metoclopramide and morphine.  Old records are reviewed, and I do not have access to the records from his recent surgery or ED visit.  There are no relevant records available to me.  Little relief from pain and no relief from nausea with metoclopramide and morphine.  He is given additional morphine and a dose of prochlorperazine.  He had good relief of nausea with prochlorperazine.  Labs are reassuring.  Chest x-ray  shows no pneumothorax, but small left pleural effusion.  He is discharged with prescriptions for prochlorperazine tablet and suppository.  Also given prescription for small number of oxycodone-acetaminophen tablets.  Review of the [place] controlled substance reporting website shows that he has had several narcotic prescriptions in the postop period.  This will need to be watched carefully to make sure that he is not abusing medications.  Return precautions were discussed.  Final Clinical Impression(s) / ED Diagnoses Final diagnoses:  Non-intractable vomiting with nausea, unspecified vomiting type  Diarrhea, unspecified type    Rx / DC Orders ED Discharge Orders         Ordered    oxyCODONE-acetaminophen (PERCOCET) 5-325 MG tablet  Every 4 hours PRN     Discontinue  Reprint     05/02/20 0537    prochlorperazine (COMPAZINE) 25 MG suppository  Every 12 hours PRN     Discontinue  Reprint     05/02/20 0537    prochlorperazine (COMPAZINE) 10 MG tablet  Every 6 hours PRN     Discontinue  Reprint     05/02/20 0537           Dione Booze, MD 05/02/20 [phone]

## 2020-05-02 NOTE — ED Notes (Signed)
PT is unable to give urine at this time.

## 2020-05-02 NOTE — ED Notes (Signed)
Urine sample and culture sent down to lab

## 2020-05-05 DIAGNOSIS — K449 Diaphragmatic hernia without obstruction or gangrene: Secondary | ICD-10-CM | POA: Diagnosis not present

## 2020-05-05 DIAGNOSIS — K228 Other specified diseases of esophagus: Secondary | ICD-10-CM | POA: Diagnosis not present

## 2020-05-06 DIAGNOSIS — Z7982 Long term (current) use of aspirin: Secondary | ICD-10-CM | POA: Diagnosis not present

## 2020-05-06 DIAGNOSIS — K3189 Other diseases of stomach and duodenum: Secondary | ICD-10-CM | POA: Diagnosis not present

## 2020-05-06 DIAGNOSIS — M549 Dorsalgia, unspecified: Secondary | ICD-10-CM | POA: Diagnosis not present

## 2020-05-06 DIAGNOSIS — K2100 Gastro-esophageal reflux disease with esophagitis, without bleeding: Secondary | ICD-10-CM | POA: Diagnosis not present

## 2020-05-06 DIAGNOSIS — Z48815 Encounter for surgical aftercare following surgery on the digestive system: Secondary | ICD-10-CM | POA: Diagnosis not present

## 2020-05-06 DIAGNOSIS — K449 Diaphragmatic hernia without obstruction or gangrene: Secondary | ICD-10-CM | POA: Diagnosis not present

## 2020-05-06 DIAGNOSIS — K22719 Barrett's esophagus with dysplasia, unspecified: Secondary | ICD-10-CM | POA: Diagnosis not present

## 2020-05-08 ENCOUNTER — Other Ambulatory Visit: Payer: Self-pay | Admitting: Family Medicine

## 2020-05-10 DIAGNOSIS — S2242XA Multiple fractures of ribs, left side, initial encounter for closed fracture: Secondary | ICD-10-CM | POA: Diagnosis not present

## 2020-05-10 DIAGNOSIS — G8918 Other acute postprocedural pain: Secondary | ICD-10-CM | POA: Diagnosis not present

## 2020-05-10 DIAGNOSIS — K312 Hourglass stricture and stenosis of stomach: Secondary | ICD-10-CM | POA: Diagnosis not present

## 2020-05-10 DIAGNOSIS — R109 Unspecified abdominal pain: Secondary | ICD-10-CM | POA: Diagnosis not present

## 2020-05-11 DIAGNOSIS — S2242XA Multiple fractures of ribs, left side, initial encounter for closed fracture: Secondary | ICD-10-CM | POA: Diagnosis not present

## 2020-05-11 DIAGNOSIS — R109 Unspecified abdominal pain: Secondary | ICD-10-CM | POA: Diagnosis not present

## 2020-05-12 DIAGNOSIS — Z9889 Other specified postprocedural states: Secondary | ICD-10-CM | POA: Diagnosis not present

## 2020-05-12 DIAGNOSIS — R109 Unspecified abdominal pain: Secondary | ICD-10-CM | POA: Diagnosis not present

## 2020-05-12 DIAGNOSIS — G894 Chronic pain syndrome: Secondary | ICD-10-CM | POA: Diagnosis not present

## 2020-05-12 DIAGNOSIS — Z79891 Long term (current) use of opiate analgesic: Secondary | ICD-10-CM | POA: Diagnosis not present

## 2020-05-12 DIAGNOSIS — Z1389 Encounter for screening for other disorder: Secondary | ICD-10-CM | POA: Diagnosis not present

## 2020-05-14 DIAGNOSIS — K3189 Other diseases of stomach and duodenum: Secondary | ICD-10-CM | POA: Diagnosis not present

## 2020-05-14 DIAGNOSIS — K449 Diaphragmatic hernia without obstruction or gangrene: Secondary | ICD-10-CM | POA: Diagnosis not present

## 2020-05-14 DIAGNOSIS — Z7982 Long term (current) use of aspirin: Secondary | ICD-10-CM | POA: Diagnosis not present

## 2020-05-14 DIAGNOSIS — Z48815 Encounter for surgical aftercare following surgery on the digestive system: Secondary | ICD-10-CM | POA: Diagnosis not present

## 2020-05-14 DIAGNOSIS — K22719 Barrett's esophagus with dysplasia, unspecified: Secondary | ICD-10-CM | POA: Diagnosis not present

## 2020-05-14 DIAGNOSIS — K2100 Gastro-esophageal reflux disease with esophagitis, without bleeding: Secondary | ICD-10-CM | POA: Diagnosis not present

## 2020-05-15 ENCOUNTER — Other Ambulatory Visit: Payer: Self-pay

## 2020-05-15 ENCOUNTER — Emergency Department (HOSPITAL_COMMUNITY): Payer: Medicare Other

## 2020-05-15 ENCOUNTER — Encounter (HOSPITAL_COMMUNITY): Payer: Self-pay

## 2020-05-15 ENCOUNTER — Inpatient Hospital Stay (HOSPITAL_COMMUNITY)
Admission: EM | Admit: 2020-05-15 | Discharge: 2020-05-20 | DRG: 201 | Disposition: A | Discharge status: Home / Self Care | Payer: Medicare Other | Attending: Internal Medicine | Admitting: Internal Medicine

## 2020-05-15 DIAGNOSIS — K589 Irritable bowel syndrome without diarrhea: Secondary | ICD-10-CM | POA: Diagnosis not present

## 2020-05-15 DIAGNOSIS — K259 Gastric ulcer, unspecified as acute or chronic, without hemorrhage or perforation: Secondary | ICD-10-CM | POA: Diagnosis present

## 2020-05-15 DIAGNOSIS — Z7982 Long term (current) use of aspirin: Secondary | ICD-10-CM | POA: Diagnosis not present

## 2020-05-15 DIAGNOSIS — Z87892 Personal history of anaphylaxis: Secondary | ICD-10-CM | POA: Diagnosis not present

## 2020-05-15 DIAGNOSIS — S2242XA Multiple fractures of ribs, left side, initial encounter for closed fracture: Secondary | ICD-10-CM | POA: Diagnosis not present

## 2020-05-15 DIAGNOSIS — E860 Dehydration: Secondary | ICD-10-CM | POA: Diagnosis not present

## 2020-05-15 DIAGNOSIS — Z888 Allergy status to other drugs, medicaments and biological substances status: Secondary | ICD-10-CM | POA: Diagnosis not present

## 2020-05-15 DIAGNOSIS — Y838 Other surgical procedures as the cause of abnormal reaction of the patient, or of later complication, without mention of misadventure at the time of the procedure: Secondary | ICD-10-CM | POA: Diagnosis present

## 2020-05-15 DIAGNOSIS — R339 Retention of urine, unspecified: Secondary | ICD-10-CM | POA: Diagnosis present

## 2020-05-15 DIAGNOSIS — R079 Chest pain, unspecified: Secondary | ICD-10-CM

## 2020-05-15 DIAGNOSIS — R112 Nausea with vomiting, unspecified: Secondary | ICD-10-CM | POA: Diagnosis not present

## 2020-05-15 DIAGNOSIS — Z91048 Other nonmedicinal substance allergy status: Secondary | ICD-10-CM

## 2020-05-15 DIAGNOSIS — J95811 Postprocedural pneumothorax: Principal | ICD-10-CM | POA: Diagnosis present

## 2020-05-15 DIAGNOSIS — R634 Abnormal weight loss: Secondary | ICD-10-CM | POA: Diagnosis present

## 2020-05-15 DIAGNOSIS — Z79899 Other long term (current) drug therapy: Secondary | ICD-10-CM

## 2020-05-15 DIAGNOSIS — Z91013 Allergy to seafood: Secondary | ICD-10-CM

## 2020-05-15 DIAGNOSIS — R0602 Shortness of breath: Secondary | ICD-10-CM | POA: Diagnosis not present

## 2020-05-15 DIAGNOSIS — E785 Hyperlipidemia, unspecified: Secondary | ICD-10-CM | POA: Diagnosis present

## 2020-05-15 DIAGNOSIS — J939 Pneumothorax, unspecified: Secondary | ICD-10-CM

## 2020-05-15 DIAGNOSIS — Z881 Allergy status to other antibiotic agents status: Secondary | ICD-10-CM

## 2020-05-15 DIAGNOSIS — K2270 Barrett's esophagus without dysplasia: Secondary | ICD-10-CM | POA: Diagnosis not present

## 2020-05-15 DIAGNOSIS — Z20822 Contact with and (suspected) exposure to covid-19: Secondary | ICD-10-CM | POA: Diagnosis present

## 2020-05-15 DIAGNOSIS — J90 Pleural effusion, not elsewhere classified: Secondary | ICD-10-CM | POA: Diagnosis not present

## 2020-05-15 DIAGNOSIS — K3189 Other diseases of stomach and duodenum: Secondary | ICD-10-CM | POA: Diagnosis not present

## 2020-05-15 DIAGNOSIS — R1310 Dysphagia, unspecified: Secondary | ICD-10-CM

## 2020-05-15 DIAGNOSIS — R1013 Epigastric pain: Secondary | ICD-10-CM

## 2020-05-15 DIAGNOSIS — R945 Abnormal results of liver function studies: Secondary | ICD-10-CM | POA: Diagnosis not present

## 2020-05-15 DIAGNOSIS — Z452 Encounter for adjustment and management of vascular access device: Secondary | ICD-10-CM

## 2020-05-15 DIAGNOSIS — K228 Other specified diseases of esophagus: Secondary | ICD-10-CM | POA: Diagnosis not present

## 2020-05-15 HISTORY — DX: Other complications of anesthesia, initial encounter: T88.59XA

## 2020-05-15 LAB — COMPREHENSIVE METABOLIC PANEL
ALT: 19 U/L (ref 0–44)
AST: 26 U/L (ref 15–41)
Albumin: 4.1 g/dL (ref 3.5–5.0)
Alkaline Phosphatase: 64 U/L (ref 38–126)
Anion gap: 12 (ref 5–15)
BUN: 13 mg/dL (ref 6–20)
CO2: 24 mmol/L (ref 22–32)
Calcium: 9.2 mg/dL (ref 8.9–10.3)
Chloride: 104 mmol/L (ref 98–111)
Creatinine, Ser: 1.14 mg/dL (ref 0.61–1.24)
GFR calc Af Amer: >60 mL/min (ref >60)
GFR calc non Af Amer: >60 mL/min (ref >60)
Glucose, Bld: 112 mg/dL — ABNORMAL HIGH (ref 70–99)
Potassium: 4 mmol/L (ref 3.5–5.1)
Sodium: 140 mmol/L (ref 135–145)
Total Bilirubin: 1 mg/dL (ref 0.3–1.2)
Total Protein: 7.2 g/dL (ref 6.5–8.1)

## 2020-05-15 LAB — CBC
HCT: 44.9 % (ref 39.0–52.0)
Hemoglobin: 15 g/dL (ref 13.0–17.0)
MCH: 31.7 pg (ref 26.0–34.0)
MCHC: 33.4 g/dL (ref 30.0–36.0)
MCV: 94.9 fL (ref 80.0–100.0)
Platelets: 223 10*3/uL (ref 150–400)
RBC: 4.73 MIL/uL (ref 4.22–5.81)
RDW: 12.1 % (ref 11.5–15.5)
WBC: 5.9 10*3/uL (ref 4.0–10.5)
nRBC: 0 % (ref 0.0–0.2)

## 2020-05-15 LAB — LIPASE, BLOOD: Lipase: 40 U/L (ref 11–51)

## 2020-05-15 LAB — TROPONIN I (HIGH SENSITIVITY): Troponin I (High Sensitivity): <2 ng/L (ref <18)

## 2020-05-15 MED ORDER — PROMETHAZINE HCL 25 MG/ML IJ SOLN
25.0000 mg | Freq: Once | INTRAMUSCULAR | Status: AC
  Administered 2020-05-15: 25 mg via INTRAMUSCULAR
  Filled 2020-05-15: qty 1

## 2020-05-15 MED ORDER — MORPHINE SULFATE (PF) 4 MG/ML IV SOLN
4.0000 mg | Freq: Once | INTRAVENOUS | Status: AC
  Administered 2020-05-15: 4 mg via INTRAVENOUS
  Filled 2020-05-15: qty 1

## 2020-05-15 MED ORDER — SODIUM CHLORIDE 0.9% FLUSH
3.0000 mL | Freq: Once | INTRAVENOUS | Status: AC
  Administered 2020-05-15: 3 mL via INTRAVENOUS

## 2020-05-15 MED ORDER — MORPHINE SULFATE (PF) 4 MG/ML IV SOLN
4.0000 mg | Freq: Once | INTRAVENOUS | Status: AC
  Administered 2020-05-15: 4 mg via INTRAMUSCULAR
  Filled 2020-05-15: qty 1

## 2020-05-15 MED ORDER — SODIUM CHLORIDE 0.9 % IV BOLUS
1000.0000 mL | Freq: Once | INTRAVENOUS | Status: AC
  Administered 2020-05-15: 1000 mL via INTRAVENOUS

## 2020-05-15 NOTE — ED Provider Notes (Signed)
[facility]-EMERGENCY DEPT Provider Note   CSN: [id number] Arrival date & time: 05/15/20  1801     History Chief Complaint  Patient presents with  . Abdominal Pain  . Emesis  . Constipation    Edwin Hall is a 54 y.o. male.  The history is provided by the patient and medical records. No language interpreter was used.  Abdominal Pain Associated symptoms: constipation and vomiting   Emesis Associated symptoms: abdominal pain   Constipation Associated symptoms: abdominal pain and vomiting    Edwin Hall is a 54 y.o. male who presents to the Emergency Department complaining of abdominal pain, vomiting, constipation. He presents the emergency department complaining of four days of epigastric abdominal pain that radiates to his chest with associated nausea, vomiting and constipation. He reports about 10 episodes of green emesis in the last 24 hours. He had a temperature to 101 at home yesterday. He reports passing no flatus or bowel movement for the last four days. He has mild shortness of breath. He has experienced multiple abdominal surgeries, most recently was apparent esophageal hernia repair in May of this year in [place]. He has no known medical problems. He takes Phenergan has needed for his symptoms. Symptoms are severe, constant, worsening.  He has a history of multiple abdominal and chest surgeries. Most recently he had a para esophageal hernia repair at [facility] main at the beginning of May. His general surgeon is Dr. Jossie Ng. His CT surgeon is Dr. Catalina Lunger. He had a left thoracotomy performed with slices of pleural adhesions in partial rib resection. He was seen on June 6 at [facility] and had a CT chest, abdomen, pelvis that demonstrate a small left pneumothorax. On June 18 he presented to the [facility] emergency department for chest pain and had a chest x-ray with no pneumothorax. On June 21 he had an upper G.I. 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   Past Medical History:  Diagnosis Date  . Food allergy   . Headache   . Hiatal hernia   . Hyperlipidemia   . IBS (irritable bowel syndrome)   . Iron deficiency anemia   . Urticaria     Patient Active Problem List   Diagnosis Date Noted  . Pneumothorax on left 05/15/2020    Past Surgical History:  Procedure Laterality Date  . ABDOMINAL SURGERY  12/2015   pylorus plasty  . APPENDECTOMY  1984  . CHOLECYSTECTOMY  2008  . GASTROSTOMY TUBE PLACEMENT    . HIATAL HERNIA REPAIR  07/2015  . TONSILLECTOMY  1980  . VENTRAL HERNIA REPAIR  2010       Family History  Problem Relation Age of Onset  . Parkinson's disease Father   . Heart disease Father   . Angioedema Mother   . Allergic rhinitis Neg Hx   . Asthma Neg Hx   . Eczema Neg Hx   . Immunodeficiency Neg Hx   . Urticaria Neg Hx     Social History   Tobacco Use  . Smoking status: Never Smoker  . Smokeless tobacco: Never Used  Vaping Use  . Vaping Use: Never used  Substance Use Topics  . Alcohol use: No  . Drug use: No    Home Medications Prior to Admission medications   Medication Sig Start Date End Date Taking? Authorizing Provider  aspirin 81 MG EC tablet Take 81 mg by mouth daily. Swallow whole.   Yes [provider]  Cholecalciferol (VITAMIN D3 PO) Take 250 mcg by mouth daily.    Yes [provider]  Cyanocobalamin (VITAMIN B 12 PO) Take 1 capsule by mouth daily.   Yes [provider]  Ferrous Sulfate (IRON PO) Take 65 mg by mouth daily.    Yes [provider]  Ferrous Sulfate (IRON) 325 (65 Fe) MG TABS Take 325 mg by mouth daily.  05/12/20  Yes [provider]  gabapentin (NEURONTIN) 300 MG capsule Take 300 mg by mouth daily.  03/29/20  Yes [provider]  Ginkgo Biloba 40 MG TABS Take 40 mg by mouth daily.    Yes [provider]  ipratropium (ATROVENT) 0.06 % nasal spray USE TWO SPRAYS IN EACH NOSTRIL TWICE  DAILY AS DIRECTED. Patient taking differently: Place 2 sprays into both nostrils daily as needed for rhinitis.  05/08/20  Yes Ambs, Norvel RichardsAnne M, FNP  mirtazapine (REMERON) 15 MG tablet Take 15 mg by mouth at bedtime. 02/08/20  Yes [provider]  Multiple Vitamin (MULTIVITAMIN WITH MINERALS) TABS tablet Take 1 tablet by mouth daily.   Yes [provider]  pantoprazole (PROTONIX) 40 MG tablet Take 40 mg by mouth 2 (two) times daily. 09/10/19  Yes [provider]  promethazine (PHENERGAN) 25 MG tablet Take 25 mg by mouth every 6 (six) hours as needed for nausea or vomiting.  04/01/20  Yes [provider]  Zinc 50 MG TABS Take 50 mg by mouth daily.    Yes [provider]  oxyCODONE-acetaminophen (PERCOCET) 5-325 MG tablet Take 1 tablet by mouth every 4 (four) hours as needed for moderate pain. Patient not taking: Reported on 05/15/2020 05/02/20   Dione BoozeGlick, David, MD  prochlorperazine (COMPAZINE) 10 MG tablet Take 1 tablet (10 mg total) by mouth every 6 (six) hours as needed for nausea or vomiting. Patient not taking: Reported on 05/15/2020 05/02/20   Dione BoozeGlick, David, MD  prochlorperazine (COMPAZINE) 25 MG suppository Place 1 suppository (25 mg total) rectally every 12 (twelve) hours as needed for nausea or vomiting. Patient not taking: Reported on 05/15/2020 05/02/20   Dione BoozeGlick, David, MD  azelastine (ASTELIN) 0.1 % nasal spray 2 sprays per nostril twice a day for control of nasal drainage. Patient not taking: Reported on 05/02/2020 11/27/19 05/02/20  Marcelyn BruinsPadgett, Shaylar Patricia, MD    Allergies    Other, Shellfish allergy, Iodine, Tetracyclines & related, and Zofran [ondansetron hcl]  Review of Systems   Review of Systems  Gastrointestinal: Positive for abdominal pain, constipation and vomiting.  All other systems reviewed and are negative.   Physical Exam Updated Vital Signs BP 119/84   Pulse 92   Temp 98.1 F (36.7 C) (Oral)   Resp 18   Ht 5\' 8"  (1.727 m)   Wt 53.1 kg    SpO2 96%   BMI 17.79 kg/m   Physical Exam Vitals and nursing note reviewed.  Constitutional:      Appearance: He is well-developed.  HENT:     Head: Normocephalic and atraumatic.  Cardiovascular:     Rate and Rhythm: Regular rhythm. Tachycardia present.     Heart sounds: No murmur heard.   Pulmonary:     Effort: Pulmonary effort is normal. No respiratory distress.     Breath sounds: Normal breath sounds.  Abdominal:     Palpations: Abdomen is soft.     Tenderness: There is no guarding or rebound.     Comments: Mild epigastric tenderness  Musculoskeletal:        General:  No tenderness.  Skin:    General: Skin is warm and dry.  Neurological:     Mental Status: He is alert and oriented to person, place, and time.  Psychiatric:        Behavior: Behavior normal.     ED Results / Procedures / Treatments   Labs (all labs ordered are listed, but only abnormal results are displayed) Labs Reviewed  COMPREHENSIVE METABOLIC PANEL - Abnormal; Notable for the following components:      Result Value   Glucose, Bld 112 (*)    All other components within normal limits  SARS CORONAVIRUS 2 BY RT PCR (HOSPITAL ORDER, PERFORMED IN [facility])  LIPASE, BLOOD  CBC  URINALYSIS, ROUTINE W REFLEX MICROSCOPIC  TROPONIN I (HIGH SENSITIVITY)    EKG None  Radiology CT Abdomen Pelvis Wo Contrast  Addendum Date: 05/15/2020   ADDENDUM REPORT: 05/15/2020 20:09 ADDENDUM: The original report was by Dr. Gaylyn Rong. The following addendum is by Dr. Gaylyn Rong: Critical Value/emergent results were called by telephone at the time of interpretation on 05/15/2020 at 7:59 pm to provider Dr. Tilden Fossa , who verbally acknowledged these results. Electronically Signed   By: Gaylyn Rong M.D.   On: 05/15/2020 20:09   Result Date: 05/15/2020 CLINICAL DATA:  Abdominal pain, vomiting, constipation, fever. Recent diaphragmatic hernia repair. EXAM: CT CHEST, ABDOMEN AND PELVIS  WITHOUT CONTRAST TECHNIQUE: Multidetector CT imaging of the chest, abdomen and pelvis was performed following the standard protocol without IV contrast. COMPARISON:  05/11/2020 exam from [facility]. FINDINGS: CT CHEST FINDINGS Cardiovascular: Atherosclerotic calcification of the left subclavian artery and left anterior descending coronary artery. Mild ascending thoracic aortic ectasia at 3.7 cm, without overt aneurysm. Mediastinum/Nodes: Mildly dilated mid to distal esophagus with air-fluid level and adjacent postoperative findings. Mildly complex appearance of the gastroesophageal junction with anterior deviation of the esophagus along the postoperative findings at the gastroesophageal junction. There is previously a more dilated appearance of the distal esophagus which may have represented a hiatal hernia, I am less certain of a hiatal hernia on today's CT. Assessment of this region is complicated by considerable elevation of the left hemidiaphragm, with postoperative findings along the medial left hemidiaphragm for example on image 44/3. No appreciable pneumomediastinum. Lungs/Pleura: There is a new moderate left pneumothorax, about 20% of left hemithoracic volume similar postoperative findings along the left lung base. No shift of cardiac or mediastinal structures to the right. Musculoskeletal: Left seventh rib osteotomy. Fractures of the left sixth and eighth ribs adjacent to the site of osteotomy, with some early healing response but no bridging. CT ABDOMEN PELVIS FINDINGS Hepatobiliary: Prior cholecystectomy.  Otherwise unremarkable. Pancreas: Unremarkable Spleen: Unremarkable Adrenals/Urinary Tract: Unremarkable Stomach/Bowel: Postoperative findings along the proximal stomach with complex appearance at the gastroesophageal junction possibly from fundoplication. There is oral contrast medium in the distal colon, possibly left over from prior scans. Vascular/Lymphatic: Aortoiliac atherosclerotic  vascular disease. Reproductive: Suspected moderate proximal retraction of the testes along the spermatic cords. Other: No supplemental non-categorized findings. Musculoskeletal: Grade 1 degenerative anterolisthesis at L4-5. IMPRESSION: 1. New moderate left pneumothorax, about 20% of left hemithoracic volume. Cause/origin of this pneumothorax uncertain. 2. Stable fractures of the left sixth and eighth ribs adjacent to the site of the left seventh rib osteotomy, with some early healing response but no bony bridging. 3. Mildly dilated mid to distal esophagus with air-fluid level and adjacent postoperative findings at the gastroesophageal junction. The degree of distal esophageal dilatation is less striking than  prior, given the complexity of the appearance of the distal esophagus and proximal stomach, it is difficult to completely exclude a small hiatal hernia although hernia is less obvious on the 05/11/2020 CT scan. 4. Elevated left hemidiaphragm with findings suggesting diaphragmatic repair. 5. Aortic atherosclerosis. Coronary atherosclerosis. 6. Suspected moderate proximal retraction of the testes along the spermatic cords. 7. Grade 1 degenerative anterolisthesis at L4-5. Aortic Atherosclerosis (ICD10-I70.0). Radiology assistant personnel have been notified to put me in telephone contact with the referring physician or the referring physician's clinical representative in order to discuss these findings. Once this communication is established I will issue an addendum to this report for documentation purposes. Electronically Signed: By: Gaylyn Rong M.D. On: 05/15/2020 19:57   DG Chest 2 View  Result Date: 05/15/2020 CLINICAL DATA:  Chest pain EXAM: CHEST - 2 VIEW COMPARISON:  05/02/2020, CT 05/15/2020, radiograph 04/30/2017 FINDINGS: Right lung is clear. Chronic pleural and parenchymal scarring at the left base. Small moderate left pneumothorax, best seen at the apex, probably grossly stable in size  compared with the recent CT. No midline shift. Stable cardiomediastinal silhouette. Enteral contrast or radiopaque material in the colon. Left seventh rib resection. Left sixth and eighth healing rib fractures. IMPRESSION: Small to moderate left pneumothorax, probably grossly stable compared with CT performed earlier today. Chronic pleural and parenchymal scarring at the left base. Electronically Signed   By: Jasmine Pang M.D.   On: 05/15/2020 21:28   CT Chest Wo Contrast  Addendum Date: 05/15/2020   ADDENDUM REPORT: 05/15/2020 20:09 ADDENDUM: The original report was by Dr. Gaylyn Rong. The following addendum is by Dr. Gaylyn Rong: Critical Value/emergent results were called by telephone at the time of interpretation on 05/15/2020 at 7:59 pm to provider Dr. Tilden Fossa , who verbally acknowledged these results. Electronically Signed   By: Gaylyn Rong M.D.   On: 05/15/2020 20:09   Result Date: 05/15/2020 CLINICAL DATA:  Abdominal pain, vomiting, constipation, fever. Recent diaphragmatic hernia repair. EXAM: CT CHEST, ABDOMEN AND PELVIS WITHOUT CONTRAST TECHNIQUE: Multidetector CT imaging of the chest, abdomen and pelvis was performed following the standard protocol without IV contrast. COMPARISON:  05/11/2020 exam from [facility]. FINDINGS: CT CHEST FINDINGS Cardiovascular: Atherosclerotic calcification of the left subclavian artery and left anterior descending coronary artery. Mild ascending thoracic aortic ectasia at 3.7 cm, without overt aneurysm. Mediastinum/Nodes: Mildly dilated mid to distal esophagus with air-fluid level and adjacent postoperative findings. Mildly complex appearance of the gastroesophageal junction with anterior deviation of the esophagus along the postoperative findings at the gastroesophageal junction. There is previously a more dilated appearance of the distal esophagus which may have represented a hiatal hernia, I am less certain of a hiatal hernia on  today's CT. Assessment of this region is complicated by considerable elevation of the left hemidiaphragm, with postoperative findings along the medial left hemidiaphragm for example on image 44/3. No appreciable pneumomediastinum. Lungs/Pleura: There is a new moderate left pneumothorax, about 20% of left hemithoracic volume similar postoperative findings along the left lung base. No shift of cardiac or mediastinal structures to the right. Musculoskeletal: Left seventh rib osteotomy. Fractures of the left sixth and eighth ribs adjacent to the site of osteotomy, with some early healing response but no bridging. CT ABDOMEN PELVIS FINDINGS Hepatobiliary: Prior cholecystectomy.  Otherwise unremarkable. Pancreas: Unremarkable Spleen: Unremarkable Adrenals/Urinary Tract: Unremarkable Stomach/Bowel: Postoperative findings along the proximal stomach with complex appearance at the gastroesophageal junction possibly from fundoplication. There is oral contrast medium in the distal colon, possibly  left over from prior scans. Vascular/Lymphatic: Aortoiliac atherosclerotic vascular disease. Reproductive: Suspected moderate proximal retraction of the testes along the spermatic cords. Other: No supplemental non-categorized findings. Musculoskeletal: Grade 1 degenerative anterolisthesis at L4-5. IMPRESSION: 1. New moderate left pneumothorax, about 20% of left hemithoracic volume. Cause/origin of this pneumothorax uncertain. 2. Stable fractures of the left sixth and eighth ribs adjacent to the site of the left seventh rib osteotomy, with some early healing response but no bony bridging. 3. Mildly dilated mid to distal esophagus with air-fluid level and adjacent postoperative findings at the gastroesophageal junction. The degree of distal esophageal dilatation is less striking than prior, given the complexity of the appearance of the distal esophagus and proximal stomach, it is difficult to completely exclude a small hiatal hernia  although hernia is less obvious on the 05/11/2020 CT scan. 4. Elevated left hemidiaphragm with findings suggesting diaphragmatic repair. 5. Aortic atherosclerosis. Coronary atherosclerosis. 6. Suspected moderate proximal retraction of the testes along the spermatic cords. 7. Grade 1 degenerative anterolisthesis at L4-5. Aortic Atherosclerosis (ICD10-I70.0). Radiology assistant personnel have been notified to put me in telephone contact with the referring physician or the referring physician's clinical representative in order to discuss these findings. Once this communication is established I will issue an addendum to this report for documentation purposes. Electronically Signed: By: Gaylyn Rong M.D. On: 05/15/2020 19:57    Procedures Procedures (including critical care time)  Medications Ordered in ED Medications  sodium chloride flush (NS) 0.9 % injection 3 mL (3 mLs Intravenous Given 05/15/20 2208)  sodium chloride 0.9 % bolus 1,000 mL (0 mLs Intravenous Stopped 05/15/20 2222)  morphine 4 MG/ML injection 4 mg (4 mg Intramuscular Given 05/15/20 1953)  promethazine (PHENERGAN) injection 25 mg (25 mg Intramuscular Given 05/15/20 1953)  morphine 4 MG/ML injection 4 mg (4 mg Intravenous Given 05/15/20 2224)  morphine 4 MG/ML injection 4 mg (4 mg Intravenous Given 05/15/20 2322)  promethazine (PHENERGAN) injection 25 mg (25 mg Intramuscular Given 05/15/20 2322)    ED Course  I have reviewed the triage vital signs and the nursing notes.  Pertinent labs & imaging results that were available during my care of the patient were reviewed by me and considered in my medical decision making (see chart for details).    MDM Rules/Calculators/A&P                         Pt with hx/o multiple prior surgeries here for evaluation of abdominal pain, chest pain, vomiting. CT chest, abdomen, pelvis is significant for left sided pneumothorax. Patient without respiratory distress in the emergency department, breathing  comfortably on room air. On chest x-ray today pneumothorax is evident. He did not have this present on ED visit on June 18. Unclear when the pneumothorax developed, as he had one demonstrated on a CT scan at another facility on June 6 of this year. Attempted for multiple hours to contact patient's primary surgeon at [facility] but on able to successfully reach them. Discussed with Dr. Cliffton Asters with CT surgery, he recommends IR guided chest tube placement given patient's history of multiple prior chest surgeries and admission to medicine service. Discussed with radiology, service should be available in the [facility] in the morning. Patient appears clinically stable to await chest to placement in the morning. Patient updated findings of studies and recommendation for admission and he is in agreement with treatment plan. Hospitalist consulted for admission.  Final Clinical Impression(s) / ED Diagnoses Final  diagnoses:  Pneumothorax on left  Epigastric pain  Pneumothorax    Rx / DC Orders ED Discharge Orders    None       Tilden Fossa, MD 05/15/20 2354

## 2020-05-15 NOTE — ED Triage Notes (Addendum)
Per EMS-Patient reports that he has had intermittent abdominal pain, N/V since may when he had a hernia repair.  Patient states that he had a fever yesterday of 102.0  Patient added that he has not had a BM nor has he passed gas x 4 days.

## 2020-05-15 NOTE — ED Notes (Signed)
IV team at bedside 

## 2020-05-16 DIAGNOSIS — J95811 Postprocedural pneumothorax: Principal | ICD-10-CM

## 2020-05-16 LAB — PROTIME-INR
INR: 1.1 (ref 0.8–1.2)
Prothrombin Time: 13.7 seconds (ref 11.4–15.2)

## 2020-05-16 LAB — URINALYSIS, ROUTINE W REFLEX MICROSCOPIC
Bilirubin Urine: NEGATIVE
Glucose, UA: NEGATIVE mg/dL
Hgb urine dipstick: NEGATIVE
Ketones, ur: 20 mg/dL — AB
Leukocytes,Ua: NEGATIVE
Nitrite: NEGATIVE
Protein, ur: NEGATIVE mg/dL
Specific Gravity, Urine: 1.021 (ref 1.005–1.030)
pH: 5 (ref 5.0–8.0)

## 2020-05-16 LAB — SARS CORONAVIRUS 2 BY RT PCR (HOSPITAL ORDER, PERFORMED IN ~~LOC~~ HOSPITAL LAB): SARS Coronavirus 2: NEGATIVE

## 2020-05-16 MED ORDER — IPRATROPIUM BROMIDE 0.06 % NA SOLN
2.0000 | Freq: Every day | NASAL | Status: DC | PRN
  Filled 2020-05-16: qty 15

## 2020-05-16 MED ORDER — ZINC SULFATE 220 (50 ZN) MG PO CAPS
220.0000 mg | ORAL_CAPSULE | Freq: Every day | ORAL | Status: DC
  Administered 2020-05-16 – 2020-05-20 (×4): 220 mg via ORAL
  Filled 2020-05-16 (×5): qty 1

## 2020-05-16 MED ORDER — ASPIRIN EC 81 MG PO TBEC
81.0000 mg | DELAYED_RELEASE_TABLET | Freq: Every day | ORAL | Status: DC
  Administered 2020-05-16 – 2020-05-20 (×4): 81 mg via ORAL
  Filled 2020-05-16 (×5): qty 1

## 2020-05-16 MED ORDER — MIRTAZAPINE 15 MG PO TABS
15.0000 mg | ORAL_TABLET | Freq: Every day | ORAL | Status: DC
  Administered 2020-05-16 – 2020-05-19 (×4): 15 mg via ORAL
  Filled 2020-05-16 (×4): qty 1

## 2020-05-16 MED ORDER — PROMETHAZINE HCL 25 MG PO TABS
25.0000 mg | ORAL_TABLET | Freq: Four times a day (QID) | ORAL | Status: DC | PRN
  Administered 2020-05-19 – 2020-05-20 (×3): 25 mg via ORAL
  Filled 2020-05-16 (×3): qty 1

## 2020-05-16 MED ORDER — SODIUM CHLORIDE 0.9 % IV SOLN: INTRAVENOUS | Status: DC

## 2020-05-16 MED ORDER — DIPHENHYDRAMINE HCL 50 MG/ML IJ SOLN
12.5000 mg | Freq: Four times a day (QID) | INTRAMUSCULAR | Status: DC | PRN
  Administered 2020-05-16 – 2020-05-19 (×12): 12.5 mg via INTRAVENOUS
  Filled 2020-05-16 (×12): qty 1

## 2020-05-16 MED ORDER — MORPHINE SULFATE (PF) 2 MG/ML IV SOLN
2.0000 mg | INTRAVENOUS | Status: DC | PRN
  Administered 2020-05-16 – 2020-05-18 (×10): 4 mg via INTRAVENOUS
  Filled 2020-05-16 (×11): qty 2

## 2020-05-16 MED ORDER — PANTOPRAZOLE SODIUM 40 MG PO TBEC
40.0000 mg | DELAYED_RELEASE_TABLET | Freq: Two times a day (BID) | ORAL | Status: DC
  Administered 2020-05-16 – 2020-05-17 (×4): 40 mg via ORAL
  Filled 2020-05-16 (×5): qty 1

## 2020-05-16 MED ORDER — PROMETHAZINE HCL 25 MG/ML IJ SOLN
25.0000 mg | Freq: Four times a day (QID) | INTRAMUSCULAR | Status: DC | PRN
  Administered 2020-05-16 – 2020-05-17 (×6): 25 mg via INTRAVENOUS
  Filled 2020-05-16 (×6): qty 1

## 2020-05-16 MED ORDER — DIPHENHYDRAMINE HCL 25 MG PO CAPS
25.0000 mg | ORAL_CAPSULE | Freq: Four times a day (QID) | ORAL | Status: DC | PRN
  Administered 2020-05-16: 25 mg via ORAL
  Filled 2020-05-16: qty 1

## 2020-05-16 MED ORDER — ADULT MULTIVITAMIN W/MINERALS CH
1.0000 | ORAL_TABLET | Freq: Every day | ORAL | Status: DC
  Administered 2020-05-17 – 2020-05-20 (×3): 1 via ORAL
  Filled 2020-05-16 (×4): qty 1

## 2020-05-16 MED ORDER — VITAMIN B-12 100 MCG PO TABS
100.0000 ug | ORAL_TABLET | Freq: Every day | ORAL | Status: DC
  Administered 2020-05-17 – 2020-05-20 (×3): 100 ug via ORAL
  Filled 2020-05-16 (×5): qty 1

## 2020-05-16 MED ORDER — GABAPENTIN 300 MG PO CAPS
300.0000 mg | ORAL_CAPSULE | Freq: Every day | ORAL | Status: DC
  Filled 2020-05-16 (×2): qty 1

## 2020-05-16 MED ORDER — TAMSULOSIN HCL 0.4 MG PO CAPS
0.4000 mg | ORAL_CAPSULE | Freq: Every day | ORAL | Status: DC
  Administered 2020-05-16 – 2020-05-20 (×5): 0.4 mg via ORAL
  Filled 2020-05-16 (×4): qty 1

## 2020-05-16 MED ORDER — TAMSULOSIN HCL 0.4 MG PO CAPS
0.4000 mg | ORAL_CAPSULE | Freq: Every day | ORAL | Status: DC
  Filled 2020-05-16: qty 1

## 2020-05-16 MED ORDER — VITAMIN D3 25 MCG (1000 UNIT) PO TABS
250.0000 ug | ORAL_TABLET | Freq: Every day | ORAL | Status: DC
  Administered 2020-05-16 – 2020-05-20 (×4): 10000 [IU] via ORAL
  Filled 2020-05-16 (×5): qty 10

## 2020-05-16 NOTE — Progress Notes (Signed)
     [address]       [place] [address]             [phone]       Images and chart reviewed. This is a 54 year old male that underwent left thoracotomy for hiatal hernia repair at [facility] last month who presents with nausea vomiting and questionable shortness of breath.  On review of his images he has a small to moderate sized pneumothorax which is likely postsurgical.  There is no evidence of tension.  I would recommend continue pulmonary toilet and incentive spirometry.  Unclear reason for his nausea and vomiting but this may also be related to his surgery at [facility].  There is nothing to do right now for [place] from a surgical standpoint.  He does need to follow-up with his primary surgeon in [place].  Pam Vanalstine Keane Scrape

## 2020-05-16 NOTE — ED Notes (Signed)
Patient unable to provide urine. Patient attempted this morning and produced very small amount. Writer advised patient to continue to try as urine sample is needed. Patient understood.

## 2020-05-16 NOTE — ED Notes (Signed)
Patient given ice pack

## 2020-05-16 NOTE — Progress Notes (Signed)
Patient ID: Edwin Hall, male   DOB: 02-05-1966, 54 y.o.   MRN: [id number] Request received for possible left chest tube placement in patient.  Case reviewed by Dr. Lowella Dandy (IR attending) and discussed with thoracic surgery. They recommend conservative management of this for now, pt is hemodynamically stable and ptx is small by our review. Please contact Dr. Lowella Dandy at [phone] with any additional questions.

## 2020-05-16 NOTE — Consult Note (Signed)
Urology Consult   Physician requesting consult: Dr. Jonathon BellowsKC  Reason for consult: Difficult urethral catheterization  History of Present Illness: Edwin Hall is a 54 y.o. who is admitted with a pneumothorax s/p paraesophageal hernia repair in [place] last month.  He has not voided since yesterday and feels "full".  Bladder ultrasound with > 400 cc. No prior history of BPH or voiding problems.  He did develop post-op urinary retention after his surgery in [place] and a catheter was placed without difficulty and he was able to void after starting tamsulosin.  He is now off this medication.  Multiple attempts to place a catheter by nursing staff have been unsuccessful.  He denies a history of voiding or storage urinary symptoms, hematuria, UTIs, STDs, urolithiasis, GU malignancy/trauma/surgery.  Past Medical History:  Diagnosis Date   Food allergy    Headache    Hiatal hernia    Hyperlipidemia    IBS (irritable bowel syndrome)    Iron deficiency anemia    Urticaria     Past Surgical History:  Procedure Laterality Date   ABDOMINAL SURGERY  12/2015   pylorus plasty   APPENDECTOMY  1984   CHOLECYSTECTOMY  2008   GASTROSTOMY TUBE PLACEMENT     HIATAL HERNIA REPAIR  07/2015   TONSILLECTOMY  1980   VENTRAL HERNIA REPAIR  2010    Medications:  Home meds:  No current facility-administered medications on file prior to encounter.   Current Outpatient Medications on File Prior to Encounter  Medication Sig Dispense Refill   aspirin 81 MG EC tablet Take 81 mg by mouth daily. Swallow whole.     Cholecalciferol (VITAMIN D3 PO) Take 250 mcg by mouth daily.      Cyanocobalamin (VITAMIN B 12 PO) Take 1 capsule by mouth daily.     Ferrous Sulfate (IRON PO) Take 65 mg by mouth daily.      Ferrous Sulfate (IRON) 325 (65 Fe) MG TABS Take 325 mg by mouth daily.      gabapentin (NEURONTIN) 300 MG capsule Take 300 mg by mouth daily.      Ginkgo Biloba 40 MG TABS Take 40 mg  by mouth daily.      ipratropium (ATROVENT) 0.06 % nasal spray USE TWO SPRAYS IN EACH NOSTRIL TWICE DAILY AS DIRECTED. (Patient taking differently: Place 2 sprays into both nostrils daily as needed for rhinitis. ) 15 mL 0   mirtazapine (REMERON) 15 MG tablet Take 15 mg by mouth at bedtime.     Multiple Vitamin (MULTIVITAMIN WITH MINERALS) TABS tablet Take 1 tablet by mouth daily.     pantoprazole (PROTONIX) 40 MG tablet Take 40 mg by mouth 2 (two) times daily.     promethazine (PHENERGAN) 25 MG tablet Take 25 mg by mouth every 6 (six) hours as needed for nausea or vomiting.      Zinc 50 MG TABS Take 50 mg by mouth daily.      [DISCONTINUED] azelastine (ASTELIN) 0.1 % nasal spray 2 sprays per nostril twice a day for control of nasal drainage. (Patient not taking: Reported on 05/02/2020) 30 mL 5     Scheduled Meds:  aspirin EC  81 mg Oral Daily   cholecalciferol  250 mcg Oral Daily   gabapentin  300 mg Oral Daily   mirtazapine  15 mg Oral QHS   multivitamin with minerals  1 tablet Oral Daily   pantoprazole  40 mg Oral BID   [START ON 05/17/2020] tamsulosin  0.4 mg Oral QPC  breakfast   vitamin B-12  100 mcg Oral Daily   zinc sulfate  220 mg Oral Daily   Continuous Infusions:  sodium chloride 50 mL/hr at 05/16/20 0805   PRN Meds:.diphenhydrAMINE, ipratropium, morphine injection, promethazine, promethazine  Allergies:  Allergies  Allergen Reactions   Other Anaphylaxis    All seafood    Shellfish Allergy Anaphylaxis   Iodine Hives   Tetracyclines & Related Itching   Zofran [Ondansetron Hcl] Itching and Rash    Family History  Problem Relation Age of Onset   Parkinson's disease Father    Heart disease Father    Angioedema Mother    Allergic rhinitis Neg Hx    Asthma Neg Hx    Eczema Neg Hx    Immunodeficiency Neg Hx    Urticaria Neg Hx     Social History:  reports that he has never smoked. He has never used smokeless tobacco. He reports that he  does not drink alcohol and does not use drugs.  ROS: A complete review of systems was performed.  All systems are negative except for pertinent findings as noted.  Physical Exam:  Vital signs in last 24 hours: Temp:  [97.9 F (36.6 C)-98.1 F (36.7 C)] 98.1 F (36.7 C) (07/02 1447) Pulse Rate:  [73-102] 73 (07/02 1447) Resp:  [10-22] 16 (07/02 1447) BP: (103-127)/(74-102) 113/80 (07/02 1447) SpO2:  [94 %-100 %] 100 % (07/02 1447) Weight:  [53.9 kg] 53.9 kg (07/02 1444) Constitutional:  Alert and oriented, No acute distress Cardiovascular: Regular rate and rhythm Respiratory: Normal respiratory effort GI: Abdomen is soft, nontender, nondistended, no abdominal masses Genitourinary: No CVAT. Normal male phallus, testes are descended bilaterally and non-tender and without masses, scrotum is normal in appearance without lesions or masses, perineum is normal on inspection. Lymphatic: No lymphadenopathy Neurologic: Grossly intact, no focal deficits Psychiatric: Normal mood and affect  Laboratory Data:  Recent Labs    05/15/20 1838  WBC 5.9  HGB 15.0  HCT 44.9  PLT 223    Recent Labs    05/15/20 1838  NA 140  K 4.0  CL 104  GLUCOSE 112*  BUN 13  CALCIUM 9.2  CREATININE 1.14     Results for orders placed or performed during the hospital encounter of 05/15/20 (from the past 24 hour(s))  Lipase, blood     Status: None   Collection Time: 05/15/20  6:38 PM  Result Value Ref Range   Lipase 40 11 - 51 U/L  Comprehensive metabolic panel     Status: Abnormal   Collection Time: 05/15/20  6:38 PM  Result Value Ref Range   Sodium 140 135 - 145 mmol/L   Potassium 4.0 3.5 - 5.1 mmol/L   Chloride 104 98 - 111 mmol/L   CO2 24 22 - 32 mmol/L   Glucose, Bld 112 (H) 70 - 99 mg/dL   BUN 13 6 - 20 mg/dL   Creatinine, Ser 1.61 0.61 - 1.24 mg/dL   Calcium 9.2 8.9 - 09.6 mg/dL   Total Protein 7.2 6.5 - 8.1 g/dL   Albumin 4.1 3.5 - 5.0 g/dL   AST 26 15 - 41 U/L   ALT 19 0 - 44 U/L    Alkaline Phosphatase 64 38 - 126 U/L   Total Bilirubin 1.0 0.3 - 1.2 mg/dL   GFR calc non Af Amer >60 >60 mL/min   GFR calc Af Amer >60 >60 mL/min   Anion gap 12 5 - 15  CBC  Status: None   Collection Time: 05/15/20  6:38 PM  Result Value Ref Range   WBC 5.9 4.0 - 10.5 K/uL   RBC 4.73 4.22 - 5.81 MIL/uL   Hemoglobin 15.0 13.0 - 17.0 g/dL   HCT 27.0 39 - 52 %   MCV 94.9 80.0 - 100.0 fL   MCH 31.7 26.0 - 34.0 pg   MCHC 33.4 30.0 - 36.0 g/dL   RDW 62.3 76.2 - 83.1 %   Platelets 223 150 - 400 K/uL   nRBC 0.0 0.0 - 0.2 %  Troponin I (High Sensitivity)     Status: None   Collection Time: 05/15/20  9:25 PM  Result Value Ref Range   Troponin I (High Sensitivity) <2 <18 ng/L  SARS Coronavirus 2 by RT PCR (hospital order, performed in [facility] hospital lab) Nasopharyngeal Nasopharyngeal Swab     Status: None   Collection Time: 05/15/20 11:34 PM   Specimen: Nasopharyngeal Swab  Result Value Ref Range   SARS Coronavirus 2 NEGATIVE NEGATIVE  Protime-INR     Status: None   Collection Time: 05/16/20  8:53 AM  Result Value Ref Range   Prothrombin Time 13.7 11.4 - 15.2 seconds   INR 1.1 0.8 - 1.2   Recent Results (from the past 240 hour(s))  SARS Coronavirus 2 by RT PCR (hospital order, performed in [facility] hospital lab) Nasopharyngeal Nasopharyngeal Swab     Status: None   Collection Time: 05/15/20 11:34 PM   Specimen: Nasopharyngeal Swab  Result Value Ref Range Status   SARS Coronavirus 2 NEGATIVE NEGATIVE Final    Comment: (NOTE) SARS-CoV-2 target nucleic acids are NOT DETECTED.  The SARS-CoV-2 RNA is generally detectable in upper and lower respiratory specimens during the acute phase of infection. The lowest concentration of SARS-CoV-2 viral copies this assay can detect is 250 copies / mL. A negative result does not preclude SARS-CoV-2 infection and should not be used as the sole basis for treatment or other patient management decisions.  A negative result may occur  with improper specimen collection / handling, submission of specimen other than nasopharyngeal swab, presence of viral mutation(s) within the areas targeted by this assay, and inadequate number of viral copies (<250 copies / mL). A negative result must be combined with clinical observations, patient history, and epidemiological information.  Fact Sheet for Patients:   BoilerBrush.com.cy  Fact Sheet for Healthcare Providers: https://pope.com/  This test is not yet approved or  cleared by the [place] FDA and has been authorized for detection and/or diagnosis of SARS-CoV-2 by FDA under an Emergency Use Authorization (EUA).  This EUA will remain in effect (meaning this test can be used) for the duration of the COVID-19 declaration under Section 564(b)(1) of the Act, 21 U.S.C. section 360bbb-3(b)(1), unless the authorization is terminated or revoked sooner.  Performed at [facility], [address], [place], [place] [address]     Renal Function: Recent Labs    05/15/20 1838  CREATININE 1.14   Estimated Creatinine Clearance: 57.8 mL/min (by C-G formula based on SCr of 1.14 mg/dL).  Radiologic Imaging: CT Abdomen Pelvis Wo Contrast  Addendum Date: 05/15/2020   ADDENDUM REPORT: 05/15/2020 20:09 ADDENDUM: The original report was by Dr. Gaylyn Rong. The following addendum is by Dr. Gaylyn Rong: Critical Value/emergent results were called by telephone at the time of interpretation on 05/15/2020 at 7:59 pm to provider Dr. Tilden Fossa , who verbally acknowledged these results. Electronically Signed   By: Zollie Beckers  Ova Freshwater M.D.   On: 05/15/2020 20:09   Result Date: 05/15/2020 CLINICAL DATA:  Abdominal pain, vomiting, constipation, fever. Recent diaphragmatic hernia repair. EXAM: CT CHEST, ABDOMEN AND PELVIS WITHOUT CONTRAST TECHNIQUE: Multidetector CT imaging of the chest, abdomen and pelvis was performed  following the standard protocol without IV contrast. COMPARISON:  05/11/2020 exam from [facility]. FINDINGS: CT CHEST FINDINGS Cardiovascular: Atherosclerotic calcification of the left subclavian artery and left anterior descending coronary artery. Mild ascending thoracic aortic ectasia at 3.7 cm, without overt aneurysm. Mediastinum/Nodes: Mildly dilated mid to distal esophagus with air-fluid level and adjacent postoperative findings. Mildly complex appearance of the gastroesophageal junction with anterior deviation of the esophagus along the postoperative findings at the gastroesophageal junction. There is previously a more dilated appearance of the distal esophagus which may have represented a hiatal hernia, I am less certain of a hiatal hernia on today's CT. Assessment of this region is complicated by considerable elevation of the left hemidiaphragm, with postoperative findings along the medial left hemidiaphragm for example on image 44/3. No appreciable pneumomediastinum. Lungs/Pleura: There is a new moderate left pneumothorax, about 20% of left hemithoracic volume similar postoperative findings along the left lung base. No shift of cardiac or mediastinal structures to the right. Musculoskeletal: Left seventh rib osteotomy. Fractures of the left sixth and eighth ribs adjacent to the site of osteotomy, with some early healing response but no bridging. CT ABDOMEN PELVIS FINDINGS Hepatobiliary: Prior cholecystectomy.  Otherwise unremarkable. Pancreas: Unremarkable Spleen: Unremarkable Adrenals/Urinary Tract: Unremarkable Stomach/Bowel: Postoperative findings along the proximal stomach with complex appearance at the gastroesophageal junction possibly from fundoplication. There is oral contrast medium in the distal colon, possibly left over from prior scans. Vascular/Lymphatic: Aortoiliac atherosclerotic vascular disease. Reproductive: Suspected moderate proximal retraction of the testes along the spermatic  cords. Other: No supplemental non-categorized findings. Musculoskeletal: Grade 1 degenerative anterolisthesis at L4-5. IMPRESSION: 1. New moderate left pneumothorax, about 20% of left hemithoracic volume. Cause/origin of this pneumothorax uncertain. 2. Stable fractures of the left sixth and eighth ribs adjacent to the site of the left seventh rib osteotomy, with some early healing response but no bony bridging. 3. Mildly dilated mid to distal esophagus with air-fluid level and adjacent postoperative findings at the gastroesophageal junction. The degree of distal esophageal dilatation is less striking than prior, given the complexity of the appearance of the distal esophagus and proximal stomach, it is difficult to completely exclude a small hiatal hernia although hernia is less obvious on the 05/11/2020 CT scan. 4. Elevated left hemidiaphragm with findings suggesting diaphragmatic repair. 5. Aortic atherosclerosis. Coronary atherosclerosis. 6. Suspected moderate proximal retraction of the testes along the spermatic cords. 7. Grade 1 degenerative anterolisthesis at L4-5. Aortic Atherosclerosis (ICD10-I70.0). Radiology assistant personnel have been notified to put me in telephone contact with the referring physician or the referring physician's clinical representative in order to discuss these findings. Once this communication is established I will issue an addendum to this report for documentation purposes. Electronically Signed: By: Gaylyn Rong M.D. On: 05/15/2020 19:57   DG Chest 2 View  Result Date: 05/15/2020 CLINICAL DATA:  Chest pain EXAM: CHEST - 2 VIEW COMPARISON:  05/02/2020, CT 05/15/2020, radiograph 04/30/2017 FINDINGS: Right lung is clear. Chronic pleural and parenchymal scarring at the left base. Small moderate left pneumothorax, best seen at the apex, probably grossly stable in size compared with the recent CT. No midline shift. Stable cardiomediastinal silhouette. Enteral contrast or  radiopaque material in the colon. Left seventh rib resection. Left sixth and eighth  healing rib fractures. IMPRESSION: Small to moderate left pneumothorax, probably grossly stable compared with CT performed earlier today. Chronic pleural and parenchymal scarring at the left base. Electronically Signed   By: Jasmine Pang M.D.   On: 05/15/2020 21:28   CT Chest Wo Contrast  Addendum Date: 05/15/2020   ADDENDUM REPORT: 05/15/2020 20:09 ADDENDUM: The original report was by Dr. Gaylyn Rong. The following addendum is by Dr. Gaylyn Rong: Critical Value/emergent results were called by telephone at the time of interpretation on 05/15/2020 at 7:59 pm to provider Dr. Tilden Fossa , who verbally acknowledged these results. Electronically Signed   By: Gaylyn Rong M.D.   On: 05/15/2020 20:09   Result Date: 05/15/2020 CLINICAL DATA:  Abdominal pain, vomiting, constipation, fever. Recent diaphragmatic hernia repair. EXAM: CT CHEST, ABDOMEN AND PELVIS WITHOUT CONTRAST TECHNIQUE: Multidetector CT imaging of the chest, abdomen and pelvis was performed following the standard protocol without IV contrast. COMPARISON:  05/11/2020 exam from [facility]. FINDINGS: CT CHEST FINDINGS Cardiovascular: Atherosclerotic calcification of the left subclavian artery and left anterior descending coronary artery. Mild ascending thoracic aortic ectasia at 3.7 cm, without overt aneurysm. Mediastinum/Nodes: Mildly dilated mid to distal esophagus with air-fluid level and adjacent postoperative findings. Mildly complex appearance of the gastroesophageal junction with anterior deviation of the esophagus along the postoperative findings at the gastroesophageal junction. There is previously a more dilated appearance of the distal esophagus which may have represented a hiatal hernia, I am less certain of a hiatal hernia on today's CT. Assessment of this region is complicated by considerable elevation of the left hemidiaphragm, with  postoperative findings along the medial left hemidiaphragm for example on image 44/3. No appreciable pneumomediastinum. Lungs/Pleura: There is a new moderate left pneumothorax, about 20% of left hemithoracic volume similar postoperative findings along the left lung base. No shift of cardiac or mediastinal structures to the right. Musculoskeletal: Left seventh rib osteotomy. Fractures of the left sixth and eighth ribs adjacent to the site of osteotomy, with some early healing response but no bridging. CT ABDOMEN PELVIS FINDINGS Hepatobiliary: Prior cholecystectomy.  Otherwise unremarkable. Pancreas: Unremarkable Spleen: Unremarkable Adrenals/Urinary Tract: Unremarkable Stomach/Bowel: Postoperative findings along the proximal stomach with complex appearance at the gastroesophageal junction possibly from fundoplication. There is oral contrast medium in the distal colon, possibly left over from prior scans. Vascular/Lymphatic: Aortoiliac atherosclerotic vascular disease. Reproductive: Suspected moderate proximal retraction of the testes along the spermatic cords. Other: No supplemental non-categorized findings. Musculoskeletal: Grade 1 degenerative anterolisthesis at L4-5. IMPRESSION: 1. New moderate left pneumothorax, about 20% of left hemithoracic volume. Cause/origin of this pneumothorax uncertain. 2. Stable fractures of the left sixth and eighth ribs adjacent to the site of the left seventh rib osteotomy, with some early healing response but no bony bridging. 3. Mildly dilated mid to distal esophagus with air-fluid level and adjacent postoperative findings at the gastroesophageal junction. The degree of distal esophageal dilatation is less striking than prior, given the complexity of the appearance of the distal esophagus and proximal stomach, it is difficult to completely exclude a small hiatal hernia although hernia is less obvious on the 05/11/2020 CT scan. 4. Elevated left hemidiaphragm with findings suggesting  diaphragmatic repair. 5. Aortic atherosclerosis. Coronary atherosclerosis. 6. Suspected moderate proximal retraction of the testes along the spermatic cords. 7. Grade 1 degenerative anterolisthesis at L4-5. Aortic Atherosclerosis (ICD10-I70.0). Radiology assistant personnel have been notified to put me in telephone contact with the referring physician or the referring physician's clinical representative in order to discuss these findings.  Once this communication is established I will issue an addendum to this report for documentation purposes. Electronically Signed: By: Gaylyn Rong M.D. On: 05/15/2020 19:57    I independently reviewed the above imaging studies.  Procedure: Under sterile conditions, I was able to insert a 16 French Foley catheter without resistance.  This returned grossly clear urine.    Impression/Recommendation: 1.  Urinary retention: His catheter will be left indwelling overnight.  Will be started on alpha-blocker therapy with tamsulosin again.  We will consider a voiding trial in the morning.  Crecencio Mc 05/16/2020, 6:12 PM    Moody Bruins MD  CC: Dr. Jonathon Bellows

## 2020-05-16 NOTE — Progress Notes (Signed)
PROGRESS NOTE    Edwin Hall  KGU:[id number] DOB: [birth year] DOA: 05/15/2020 PCP: Charlott Rakes, MD   Chef Complaints: chest pain  Brief Narrative: 54 y.o. male with medical history significant for irritable bowel syndrome, hyperlipidemia, and hiatal hernia status post surgical repair at [facility] in [place] on 03/17/2020, now presenting to emergency department with abdominal pain, nausea, vomiting, chest pain, and shortness of breath.  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  He continues to have the symptoms and then developed chest pain and shortness of breath a couple weeks ago for which she was seen at [facility] last month and found to have a small left-sided pneumothorax that resolved without treatment.  His abdominal pain, nausea, and vomiting began to worsen over the past few days and he has developed recurrent pain in the left chest, shortness of breath, and mild nonproductive cough.  Abdominal pain is mainly in the left lower and mid abdomen without alleviating or exacerbating factors.  He denies any diarrhea, melena, or hematochezia.  He has been taking Phenergan at home with some relief.  He reports history of childhood asthma but has not used an inhaler in many years and denies any history of smoking or COPD.  ED Course: Upon arrival to the ED, patient is found to be afebrile, saturating well on room air, slightly tachypneic, and with stable blood pressure.  Chemistry panel and CBC are unremarkable, troponin undetectable, and urinalysis with 20 ketones.  CT of the chest/abdomen/pelvis is concerning for new/recurrent left pneumothorax, approximately 20%, stable left sixth and eighth rib fractures adjacent to seventh rib osteotomy, and mildly  dilated mid to distal esophagus with air-fluid level that appears decreased from CT several days ago.  Patient was given a liter of saline, multiple doses of morphine and Phenergan, and cardiothoracic surgery was consulted by the ED physician.  Surgeon recommended a medical admission  Subjective: Hurts to take a deep breath on left side. Some nausea but no vomiting On RA.  Assessment & Plan:  Pneumothorax recurrent, in the setting of left thoracotomy for hiatal hernia repair at [facility] last month, small to moderate-sized pneumothorax present likely postsurgical, no evidence of tension, I discussed with Dr. Cliffton Asters who has reviewed the imaging and did not advise chest tube insertion at this time.  Discussed with IR.  Advised to continue incentive spirometry follow-up chest x-ray in the morning if remains stable can discharge and he will follow up in the office.  I discussed with CT surgery okay to monitor him at [facility] for now.  Nausea & vomiting abdominal pain.  CT abdomen pelvis on admissions"mildly dilated mid to distal esophagus with air-fluid level and adjacent postoperative findings at the gastroesophageal junction. The degree of distal esophageal dilatation is less striking than prior, given the complexity of the appearance of the distal esophagus and proximal stomach, it is difficult to completely exclude a small hiatal hernia although hernia is less obvious on the 05/11/2020 CT scan. 4. Elevated left hemidiaphragm with findings suggesting diaphragmatic repair."  This morning feels better will start on full liquid diet and advance as tolerated.  Continue symptomatic management.  Patient was thought to have a stricture and will need to follow-up with his GI/surgery at [facility] outpatient EGD.  Cont with medical management.   DVT  prophylaxis:SCD Code Status: full Family Communication: plan of care discussed with patient at bedside.  Status is: Inpatient  Remains inpatient appropriate because:IV  treatments appropriate due to intensity of illness or inability to take PO, Inpatient level of care appropriate due to severity of illness and For monitoring of the pneumothorax, for diet tolerance   Dispo: The patient is from: Home              Anticipated d/c is to: Home              Anticipated d/c date is: 1 day              Patient currently is not medically stable to d/c.  Anticipate discharge tomorrow if chest x-ray remains stable, tolerating diet and asymptomatic.  Nutrition: Diet Order            Diet full liquid Room service appropriate? Yes; Fluid consistency: Thin  Diet effective now                Consultants:see note  Procedures:see note Microbiology:see note  Medications: Scheduled Meds: . aspirin  81 mg Oral Daily  . cholecalciferol  250 mcg Oral Daily  . gabapentin  300 mg Oral Daily  . mirtazapine  15 mg Oral QHS  . multivitamin with minerals  1 tablet Oral Daily  . pantoprazole  40 mg Oral BID  . Vitamin B 12   Oral Daily  . Zinc  50 mg Oral Daily   Continuous Infusions: . sodium chloride 50 mL/hr at 05/16/20 0805    Antimicrobials: Anti-infectives (From admission, onward)   None       Objective: Vitals: Today's Vitals   05/16/20 1230 05/16/20 1301 05/16/20 1330 05/16/20 1352  BP: 112/84 (!) 126/98 103/76   Pulse: 82 80 76   Resp: 17 16 16    Temp:      TempSrc:      SpO2: 95% 97% 95%   Weight:      Height:      PainSc:    Asleep    Intake/Output Summary (Last 24 hours) at 05/16/2020 1419 Last data filed at 05/15/2020 2222 Gross per 24 hour  Intake 1000 ml  Output --  Net 1000 ml   Filed Weights   05/15/20 1808  Weight: 53.1 kg   Weight change:    Intake/Output from previous day: 07/01 0701 - 07/02 0700 In: 1000 [IV Piggyback:1000] Out: -  Intake/Output this shift: No intake/output data recorded.  Examination:  General exam: AAO x3,NAD, weak appearing. HEENT:Oral mucosa moist, Ear/Nose WNL grossly,dentition  normal. Respiratory system: bilaterally diminished more on left lower side,no wheezing or crackles,no use of accessory muscle, non tender. Cardiovascular system: S1 & S2 +, regular, No JVD. Gastrointestinal system: Abdomen soft, NT,ND, BS+. Nervous System:Alert, awake, moving extremities and grossly nonfocal Extremities: No edema, distal peripheral pulses palpable.  Skin: No rashes,no icterus. MSK: Normal muscle bulk,tone, power  Data Reviewed: I have personally reviewed following labs and imaging studies CBC: Recent Labs  Lab 05/15/20 1838  WBC 5.9  HGB 15.0  HCT 44.9  MCV 94.9  PLT 223   Basic Metabolic Panel: Recent Labs  Lab 05/15/20 1838  NA 140  K 4.0  CL 104  CO2 24  GLUCOSE 112*  BUN 13  CREATININE 1.14  CALCIUM 9.2   GFR: Estimated Creatinine Clearance: 56.9 mL/min (by C-G formula based on SCr of 1.14 mg/dL). Liver Function Tests: Recent Labs  Lab 05/15/20 1838  AST 26  ALT 19  ALKPHOS 64  BILITOT 1.0  PROT 7.2  ALBUMIN 4.1   Recent Labs  Lab 05/15/20 1838  LIPASE 40   No results for input(s): AMMONIA in the last 168 hours. Coagulation Profile: Recent Labs  Lab 05/16/20 0853  INR 1.1   Cardiac Enzymes: No results for input(s): CKTOTAL, CKMB, CKMBINDEX, TROPONINI in the last 168 hours. BNP (last 3 results) No results for input(s): PROBNP in the last 8760 hours. HbA1C: No results for input(s): HGBA1C in the last 72 hours. CBG: No results for input(s): GLUCAP in the last 168 hours. Lipid Profile: No results for input(s): CHOL, HDL, LDLCALC, TRIG, CHOLHDL, LDLDIRECT in the last 72 hours. Thyroid Function Tests: No results for input(s): TSH, T4TOTAL, FREET4, T3FREE, THYROIDAB in the last 72 hours. Anemia Panel: No results for input(s): VITAMINB12, FOLATE, FERRITIN, TIBC, IRON, RETICCTPCT in the last 72 hours. Sepsis Labs: No results for input(s): PROCALCITON, LATICACIDVEN in the last 168 hours.  Recent Results (from the past 240 hour(s))   SARS Coronavirus 2 by RT PCR (hospital order, performed in [facility] hospital lab) Nasopharyngeal Nasopharyngeal Swab     Status: None   Collection Time: 05/15/20 11:34 PM   Specimen: Nasopharyngeal Swab  Result Value Ref Range Status   SARS Coronavirus 2 NEGATIVE NEGATIVE Final    Comment: (NOTE) SARS-CoV-2 target nucleic acids are NOT DETECTED.  The SARS-CoV-2 RNA is generally detectable in upper and lower respiratory specimens during the acute phase of infection. The lowest concentration of SARS-CoV-2 viral copies this assay can detect is 250 copies / mL. A negative result does not preclude SARS-CoV-2 infection and should not be used as the sole basis for treatment or other patient management decisions.  A negative result may occur with improper specimen collection / handling, submission of specimen other than nasopharyngeal swab, presence of viral mutation(s) within the areas targeted by this assay, and inadequate number of viral copies (<250 copies / mL). A negative result must be combined with clinical observations, patient history, and epidemiological information.  Fact Sheet for Patients:   BoilerBrush.com.cyhttps://www.fda.gov/media/136312/download  Fact Sheet for Healthcare Providers: https://pope.com/https://www.fda.gov/media/136313/download  This test is not yet approved or  cleared by the [place] FDA and has been authorized for detection and/or diagnosis of SARS-CoV-2 by FDA under an Emergency Use Authorization (EUA).  This EUA will remain in effect (meaning this test can be used) for the duration of the COVID-19 declaration under Section 564(b)(1) of the Act, 21 U.S.C. section 360bbb-3(b)(1), unless the authorization is terminated or revoked sooner.  Performed at [facility], [address] [place], [place] [address]       Radiology Studies: CT Abdomen Pelvis Wo Contrast  Addendum Date: 05/15/2020   ADDENDUM REPORT: 05/15/2020 20:09 ADDENDUM: The original report  was by Dr. Gaylyn RongWalter Liebkemann. The following addendum is by Dr. Gaylyn RongWalter Liebkemann: Critical Value/emergent results were called by telephone at the time of interpretation on 05/15/2020 at 7:59 pm to provider Dr. Tilden FossaELIZABETH REES , who verbally acknowledged these results. Electronically Signed   By: Gaylyn RongWalter  Liebkemann M.D.   On: 05/15/2020 20:09   Result Date: 05/15/2020 CLINICAL DATA:  Abdominal pain, vomiting, constipation, fever. Recent diaphragmatic hernia repair. EXAM: CT CHEST, ABDOMEN AND PELVIS WITHOUT CONTRAST TECHNIQUE: Multidetector CT imaging of the chest, abdomen and pelvis was performed following the standard protocol without IV contrast. COMPARISON:  05/11/2020 [facility] FINDINGS: CT CHEST FINDINGS Cardiovascular: Atherosclerotic calcification of the left subclavian artery and left anterior descending coronary  artery. Mild ascending thoracic aortic ectasia at 3.7 cm, without overt aneurysm. Mediastinum/Nodes: Mildly dilated mid to distal esophagus with air-fluid level and adjacent postoperative findings. Mildly complex appearance of the gastroesophageal junction with anterior deviation of the esophagus along the postoperative findings at the gastroesophageal junction. There is previously a more dilated appearance of the distal esophagus which may have represented a hiatal hernia, I am less certain of a hiatal hernia on today's CT. Assessment of this region is complicated by considerable elevation of the left hemidiaphragm, with postoperative findings along the medial left hemidiaphragm for example on image 44/3. No appreciable pneumomediastinum. Lungs/Pleura: There is a new moderate left pneumothorax, about 20% of left hemithoracic volume similar postoperative findings along the left lung base. No shift of cardiac or mediastinal structures to the right. Musculoskeletal: Left seventh rib osteotomy. Fractures of the left sixth and eighth ribs adjacent to the site of osteotomy, with some  early healing response but no bridging. CT ABDOMEN PELVIS FINDINGS Hepatobiliary: Prior cholecystectomy.  Otherwise unremarkable. Pancreas: Unremarkable Spleen: Unremarkable Adrenals/Urinary Tract: Unremarkable Stomach/Bowel: Postoperative findings along the proximal stomach with complex appearance at the gastroesophageal junction possibly from fundoplication. There is oral contrast medium in the distal colon, possibly left over from prior scans. Vascular/Lymphatic: Aortoiliac atherosclerotic vascular disease. Reproductive: Suspected moderate proximal retraction of the testes along the spermatic cords. Other: No supplemental non-categorized findings. Musculoskeletal: Grade 1 degenerative anterolisthesis at L4-5. IMPRESSION: 1. New moderate left pneumothorax, about 20% of left hemithoracic volume. Cause/origin of this pneumothorax uncertain. 2. Stable fractures of the left sixth and eighth ribs adjacent to the site of the left seventh rib osteotomy, with some early healing response but no bony bridging. 3. Mildly dilated mid to distal esophagus with air-fluid level and adjacent postoperative findings at the gastroesophageal junction. The degree of distal esophageal dilatation is less striking than prior, given the complexity of the appearance of the distal esophagus and proximal stomach, it is difficult to completely exclude a small hiatal hernia although hernia is less obvious on the 05/11/2020 CT scan. 4. Elevated left hemidiaphragm with findings suggesting diaphragmatic repair. 5. Aortic atherosclerosis. Coronary atherosclerosis. 6. Suspected moderate proximal retraction of the testes along the spermatic cords. 7. Grade 1 degenerative anterolisthesis at L4-5. Aortic Atherosclerosis (ICD10-I70.0). Radiology assistant personnel have been notified to put me in telephone contact with the referring physician or the referring physician's clinical representative in order to discuss these findings. Once this communication  is established I will issue an addendum to this report for documentation purposes. Electronically Signed: By: Gaylyn Rong M.D. On: 05/15/2020 19:57   DG Chest 2 View  Result Date: 05/15/2020 CLINICAL DATA:  Chest pain EXAM: CHEST - 2 VIEW COMPARISON:  05/02/2020, CT 05/15/2020, radiograph 04/30/2017 FINDINGS: Right lung is clear. Chronic pleural and parenchymal scarring at the left base. Small moderate left pneumothorax, best seen at the apex, probably grossly stable in size compared with the recent CT. No midline shift. Stable cardiomediastinal silhouette. Enteral contrast or radiopaque material in the colon. Left seventh rib resection. Left sixth and eighth healing rib fractures. IMPRESSION: Small to moderate left pneumothorax, probably grossly stable compared with CT performed earlier today. Chronic pleural and parenchymal scarring at the left base. Electronically Signed   By: Jasmine Pang M.D.   On: 05/15/2020 21:28   CT Chest Wo Contrast  Addendum Date: 05/15/2020   ADDENDUM REPORT: 05/15/2020 20:09 ADDENDUM: The original report was by Dr. Gaylyn Rong. The following addendum is by Dr. Gaylyn Rong: Critical Value/emergent  results were called by telephone at the time of interpretation on 05/15/2020 at 7:59 pm to provider Dr. Tilden Fossa , who verbally acknowledged these results. Electronically Signed   By: Gaylyn Rong M.D.   On: 05/15/2020 20:09   Result Date: 05/15/2020 CLINICAL DATA:  Abdominal pain, vomiting, constipation, fever. Recent diaphragmatic hernia repair. EXAM: CT CHEST, ABDOMEN AND PELVIS WITHOUT CONTRAST TECHNIQUE: Multidetector CT imaging of the chest, abdomen and pelvis was performed following the standard protocol without IV contrast. COMPARISON:  05/11/2020 exam from [facility] FINDINGS: CT CHEST FINDINGS Cardiovascular: Atherosclerotic calcification of the left subclavian artery and left anterior descending coronary artery. Mild ascending thoracic  aortic ectasia at 3.7 cm, without overt aneurysm. Mediastinum/Nodes: Mildly dilated mid to distal esophagus with air-fluid level and adjacent postoperative findings. Mildly complex appearance of the gastroesophageal junction with anterior deviation of the esophagus along the postoperative findings at the gastroesophageal junction. There is previously a more dilated appearance of the distal esophagus which may have represented a hiatal hernia, I am less certain of a hiatal hernia on today's CT. Assessment of this region is complicated by considerable elevation of the left hemidiaphragm, with postoperative findings along the medial left hemidiaphragm for example on image 44/3. No appreciable pneumomediastinum. Lungs/Pleura: There is a new moderate left pneumothorax, about 20% of left hemithoracic volume similar postoperative findings along the left lung base. No shift of cardiac or mediastinal structures to the right. Musculoskeletal: Left seventh rib osteotomy. Fractures of the left sixth and eighth ribs adjacent to the site of osteotomy, with some early healing response but no bridging. CT ABDOMEN PELVIS FINDINGS Hepatobiliary: Prior cholecystectomy.  Otherwise unremarkable. Pancreas: Unremarkable Spleen: Unremarkable Adrenals/Urinary Tract: Unremarkable Stomach/Bowel: Postoperative findings along the proximal stomach with complex appearance at the gastroesophageal junction possibly from fundoplication. There is oral contrast medium in the distal colon, possibly left over from prior scans. Vascular/Lymphatic: Aortoiliac atherosclerotic vascular disease. Reproductive: Suspected moderate proximal retraction of the testes along the spermatic cords. Other: No supplemental non-categorized findings. Musculoskeletal: Grade 1 degenerative anterolisthesis at L4-5. IMPRESSION: 1. New moderate left pneumothorax, about 20% of left hemithoracic volume. Cause/origin of this pneumothorax uncertain. 2. Stable fractures of the left  sixth and eighth ribs adjacent to the site of the left seventh rib osteotomy, with some early healing response but no bony bridging. 3. Mildly dilated mid to distal esophagus with air-fluid level and adjacent postoperative findings at the gastroesophageal junction. The degree of distal esophageal dilatation is less striking than prior, given the complexity of the appearance of the distal esophagus and proximal stomach, it is difficult to completely exclude a small hiatal hernia although hernia is less obvious on the 05/11/2020 CT scan. 4. Elevated left hemidiaphragm with findings suggesting diaphragmatic repair. 5. Aortic atherosclerosis. Coronary atherosclerosis. 6. Suspected moderate proximal retraction of the testes along the spermatic cords. 7. Grade 1 degenerative anterolisthesis at L4-5. Aortic Atherosclerosis (ICD10-I70.0). Radiology assistant personnel have been notified to put me in telephone contact with the referring physician or the referring physician's clinical representative in order to discuss these findings. Once this communication is established I will issue an addendum to this report for documentation purposes. Electronically Signed: By: Gaylyn Rong M.D. On: 05/15/2020 19:57     LOS: 1 day   Lanae Boast, MD [facility]  05/16/2020, 2:19 PM

## 2020-05-16 NOTE — ED Notes (Signed)
Edit administration phenergan not yet given at this time

## 2020-05-16 NOTE — ED Notes (Signed)
I asked patient for urine sample and patient stated he is still unable to give one

## 2020-05-16 NOTE — Progress Notes (Signed)
Pt has not voided since admission. Bladder scanner reads . I&O cath was unsuccessful with two attempts. Pt states that he feels the urge to void but is unable to. Dr. Jonathon Bellows notified. Awaiting orders.

## 2020-05-16 NOTE — H&P (Signed)
History and Physical    Edwin Hall:[id number] DOB: 1966-06-19 DOA: 05/15/2020  PCP: Charlott Rakes, MD   Patient coming from: Home   Chief Complaint: Abdominal pain, N/V, chest pain, SOB   HPI: Edwin Hall is a 54 y.o. male with medical history significant for irritable bowel syndrome, hyperlipidemia, and hiatal hernia status post surgical repair at [facility] in [place] on 03/17/2020, now presenting to emergency department with abdominal pain, nausea, vomiting, chest pain, and shortness of breath.  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  He continues to have the symptoms and then developed chest pain and shortness of breath a couple weeks ago for which she was seen at [facility] last month and found to have a small left-sided pneumothorax that resolved without treatment.  His abdominal pain, nausea, and vomiting began to worsen over the past few days and he has developed recurrent pain in the left chest, shortness of breath, and mild nonproductive cough.  Abdominal pain is mainly in the left lower and mid abdomen without alleviating or exacerbating factors.  He denies any diarrhea, melena, or hematochezia.  He has been taking Phenergan at home with some relief.  He reports history of childhood asthma but has not used an inhaler in many years and denies any history of smoking or COPD.  ED Course: Upon arrival to the ED, patient is found to be afebrile, saturating well on room air, slightly tachypneic, and with stable blood pressure.  Chemistry panel and CBC are unremarkable, troponin undetectable, and urinalysis with 20 ketones.  CT of the chest/abdomen/pelvis is concerning for new/recurrent left pneumothorax, approximately 20%, stable left sixth  and eighth rib fractures adjacent to seventh rib osteotomy, and mildly dilated mid to distal esophagus with air-fluid level that appears decreased from CT several days ago.  Patient was given a liter of saline, multiple doses of morphine and Phenergan, and cardiothoracic surgery was consulted by the ED physician.  Surgeon recommended a medical admission, IR consultation for chest tube placement, and indicates that they will see the patient in consultation.  Review of Systems:  All other systems reviewed and apart from HPI, are negative.  Past Medical History:  Diagnosis Date  . Food allergy   . Headache   . Hiatal hernia   . Hyperlipidemia   . IBS (irritable bowel syndrome)   . Iron deficiency anemia   . Urticaria     Past Surgical History:  Procedure Laterality Date  . ABDOMINAL SURGERY  12/2015   pylorus plasty  . APPENDECTOMY  1984  . CHOLECYSTECTOMY  2008  . GASTROSTOMY TUBE PLACEMENT    . HIATAL HERNIA REPAIR  07/2015  . TONSILLECTOMY  1980  . VENTRAL HERNIA REPAIR  2010     reports that he has never smoked. He has never used smokeless tobacco. He reports that he does not drink alcohol and does not use drugs.  Allergies  Allergen Reactions  . Other Anaphylaxis    All seafood   . Shellfish Allergy Anaphylaxis  . Iodine Hives  . Tetracyclines & Related Itching  . Zofran [Ondansetron Hcl] Itching and Rash    Family History  Problem Relation Age of Onset  . Parkinson's disease Father   . Heart disease Father   . Angioedema Mother   .  Allergic rhinitis Neg Hx   . Asthma Neg Hx   . Eczema Neg Hx   . Immunodeficiency Neg Hx   . Urticaria Neg Hx      Prior to Admission medications   Medication Sig Start Date End Date Taking? Authorizing Provider  aspirin 81 MG EC tablet Take 81 mg by mouth daily. Swallow whole.   Yes [provider]  Cholecalciferol (VITAMIN D3 PO) Take 250 mcg by mouth daily.    Yes [provider]  Cyanocobalamin (VITAMIN B 12  PO) Take 1 capsule by mouth daily.   Yes [provider]  Ferrous Sulfate (IRON PO) Take 65 mg by mouth daily.    Yes [provider]  Ferrous Sulfate (IRON) 325 (65 Fe) MG TABS Take 325 mg by mouth daily.  05/12/20  Yes [provider]  gabapentin (NEURONTIN) 300 MG capsule Take 300 mg by mouth daily.  03/29/20  Yes [provider]  Ginkgo Biloba 40 MG TABS Take 40 mg by mouth daily.    Yes [provider]  ipratropium (ATROVENT) 0.06 % nasal spray USE TWO SPRAYS IN EACH NOSTRIL TWICE DAILY AS DIRECTED. Patient taking differently: Place 2 sprays into both nostrils daily as needed for rhinitis.  05/08/20  Yes Ambs, Norvel RichardsAnne M, FNP  mirtazapine (REMERON) 15 MG tablet Take 15 mg by mouth at bedtime. 02/08/20  Yes [provider]  Multiple Vitamin (MULTIVITAMIN WITH MINERALS) TABS tablet Take 1 tablet by mouth daily.   Yes [provider]  pantoprazole (PROTONIX) 40 MG tablet Take 40 mg by mouth 2 (two) times daily. 09/10/19  Yes [provider]  promethazine (PHENERGAN) 25 MG tablet Take 25 mg by mouth every 6 (six) hours as needed for nausea or vomiting.  04/01/20  Yes [provider]  Zinc 50 MG TABS Take 50 mg by mouth daily.    Yes [provider]  oxyCODONE-acetaminophen (PERCOCET) 5-325 MG tablet Take 1 tablet by mouth every 4 (four) hours as needed for moderate pain. Patient not taking: Reported on 05/15/2020 05/02/20   Dione BoozeGlick, David, MD  prochlorperazine (COMPAZINE) 10 MG tablet Take 1 tablet (10 mg total) by mouth every 6 (six) hours as needed for nausea or vomiting. Patient not taking: Reported on 05/15/2020 05/02/20   Dione BoozeGlick, David, MD  prochlorperazine (COMPAZINE) 25 MG suppository Place 1 suppository (25 mg total) rectally every 12 (twelve) hours as needed for nausea or vomiting. Patient not taking: Reported on 05/15/2020 05/02/20   Dione BoozeGlick, David, MD  azelastine (ASTELIN) 0.1 % nasal spray 2 sprays per nostril twice a  day for control of nasal drainage. Patient not taking: Reported on 05/02/2020 11/27/19 05/02/20  Marcelyn BruinsPadgett, Shaylar Patricia, MD    Physical Exam: Vitals:   05/15/20 2230 05/15/20 2300 05/15/20 2330 05/16/20 0000  BP: 111/79 120/83 119/84 117/88  Pulse: 88 95 92 92  Resp: (!) 22 13 18 12   Temp:      TempSrc:      SpO2: 98% 96% 96% 97%  Weight:      Height:        Constitutional: NAD, calm  Eyes: PERTLA, lids and conjunctivae normal ENMT: Mucous membranes are moist. Posterior pharynx clear of any exudate or lesions.   Neck: normal, supple, no masses, no thyromegaly Respiratory: Diminished on left, no wheezing, no crackles. No accessory muscle use.  Cardiovascular: S1 & S2 heard, regular rate and rhythm. No extremity edema.   Abdomen: No distension, soft, tender in mid-abdomen without rebound  pain or guarding. Bowel sounds active.  Musculoskeletal: no clubbing / cyanosis. No joint deformity upper and lower extremities.   Skin: no significant rashes, lesions, ulcers. Warm, dry, well-perfused. Neurologic: CN 2-12 grossly intact. Sensation intact. Moving all extremities.  Psychiatric: Alert and oriented to person, place, and situation. Calm and cooperative.    Labs and Imaging on Admission: I have personally reviewed following labs and imaging studies  CBC: Recent Labs  Lab 05/15/20 1838  WBC 5.9  HGB 15.0  HCT 44.9  MCV 94.9  PLT 223   Basic Metabolic Panel: Recent Labs  Lab 05/15/20 1838  NA 140  K 4.0  CL 104  CO2 24  GLUCOSE 112*  BUN 13  CREATININE 1.14  CALCIUM 9.2   GFR: Estimated Creatinine Clearance: 56.9 mL/min (by C-G formula based on SCr of 1.14 mg/dL). Liver Function Tests: Recent Labs  Lab 05/15/20 1838  AST 26  ALT 19  ALKPHOS 64  BILITOT 1.0  PROT 7.2  ALBUMIN 4.1   Recent Labs  Lab 05/15/20 1838  LIPASE 40   No results for input(s): AMMONIA in the last 168 hours. Coagulation Profile: No results for input(s): INR, PROTIME in the last  168 hours. Cardiac Enzymes: No results for input(s): CKTOTAL, CKMB, CKMBINDEX, TROPONINI in the last 168 hours. BNP (last 3 results) No results for input(s): PROBNP in the last 8760 hours. HbA1C: No results for input(s): HGBA1C in the last 72 hours. CBG: No results for input(s): GLUCAP in the last 168 hours. Lipid Profile: No results for input(s): CHOL, HDL, LDLCALC, TRIG, CHOLHDL, LDLDIRECT in the last 72 hours. Thyroid Function Tests: No results for input(s): TSH, T4TOTAL, FREET4, T3FREE, THYROIDAB in the last 72 hours. Anemia Panel: No results for input(s): VITAMINB12, FOLATE, FERRITIN, TIBC, IRON, RETICCTPCT in the last 72 hours. Urine analysis:    Component Value Date/Time   COLORURINE AMBER (A) 05/02/2020 0415   APPEARANCEUR CLEAR 05/02/2020 0415   LABSPEC 1.016 05/02/2020 0415   PHURINE 6.0 05/02/2020 0415   GLUCOSEU NEGATIVE 05/02/2020 0415   HGBUR NEGATIVE 05/02/2020 0415   BILIRUBINUR NEGATIVE 05/02/2020 0415   KETONESUR 20 (A) 05/02/2020 0415   PROTEINUR NEGATIVE 05/02/2020 0415   NITRITE NEGATIVE 05/02/2020 0415   LEUKOCYTESUR NEGATIVE 05/02/2020 0415   Sepsis Labs: (procalcitonin:4,lacticidven:4) )No results found for this or any previous visit (from the past 240 hour(s)).   Radiological Exams on Admission: CT Abdomen Pelvis Wo Contrast  Addendum Date: 05/15/2020   ADDENDUM REPORT: 05/15/2020 20:09 ADDENDUM: The original report was by Dr. Gaylyn Rong. The following addendum is by Dr. Gaylyn Rong: Critical Value/emergent results were called by telephone at the time of interpretation on 05/15/2020 at 7:59 pm to provider Dr. Tilden Fossa , who verbally acknowledged these results. Electronically Signed   By: Gaylyn Rong M.D.   On: 05/15/2020 20:09   Result Date: 05/15/2020 CLINICAL DATA:  Abdominal pain, vomiting, constipation, fever. Recent diaphragmatic hernia repair. EXAM: CT CHEST, ABDOMEN AND PELVIS WITHOUT CONTRAST TECHNIQUE:  Multidetector CT imaging of the chest, abdomen and pelvis was performed following the standard protocol without IV contrast. COMPARISON:  05/11/2020 exam from [facility]. FINDINGS: CT CHEST FINDINGS Cardiovascular: Atherosclerotic calcification of the left subclavian artery and left anterior descending coronary artery. Mild ascending thoracic aortic ectasia at 3.7 cm, without overt aneurysm. Mediastinum/Nodes: Mildly dilated mid to distal esophagus with air-fluid level and adjacent postoperative findings. Mildly complex appearance of the gastroesophageal junction with anterior deviation of the esophagus along the postoperative findings at the  gastroesophageal junction. There is previously a more dilated appearance of the distal esophagus which may have represented a hiatal hernia, I am less certain of a hiatal hernia on today's CT. Assessment of this region is complicated by considerable elevation of the left hemidiaphragm, with postoperative findings along the medial left hemidiaphragm for example on image 44/3. No appreciable pneumomediastinum. Lungs/Pleura: There is a new moderate left pneumothorax, about 20% of left hemithoracic volume similar postoperative findings along the left lung base. No shift of cardiac or mediastinal structures to the right. Musculoskeletal: Left seventh rib osteotomy. Fractures of the left sixth and eighth ribs adjacent to the site of osteotomy, with some early healing response but no bridging. CT ABDOMEN PELVIS FINDINGS Hepatobiliary: Prior cholecystectomy.  Otherwise unremarkable. Pancreas: Unremarkable Spleen: Unremarkable Adrenals/Urinary Tract: Unremarkable Stomach/Bowel: Postoperative findings along the proximal stomach with complex appearance at the gastroesophageal junction possibly from fundoplication. There is oral contrast medium in the distal colon, possibly left over from prior scans. Vascular/Lymphatic: Aortoiliac atherosclerotic vascular disease. Reproductive:  Suspected moderate proximal retraction of the testes along the spermatic cords. Other: No supplemental non-categorized findings. Musculoskeletal: Grade 1 degenerative anterolisthesis at L4-5. IMPRESSION: 1. New moderate left pneumothorax, about 20% of left hemithoracic volume. Cause/origin of this pneumothorax uncertain. 2. Stable fractures of the left sixth and eighth ribs adjacent to the site of the left seventh rib osteotomy, with some early healing response but no bony bridging. 3. Mildly dilated mid to distal esophagus with air-fluid level and adjacent postoperative findings at the gastroesophageal junction. The degree of distal esophageal dilatation is less striking than prior, given the complexity of the appearance of the distal esophagus and proximal stomach, it is difficult to completely exclude a small hiatal hernia although hernia is less obvious on the 05/11/2020 CT scan. 4. Elevated left hemidiaphragm with findings suggesting diaphragmatic repair. 5. Aortic atherosclerosis. Coronary atherosclerosis. 6. Suspected moderate proximal retraction of the testes along the spermatic cords. 7. Grade 1 degenerative anterolisthesis at L4-5. Aortic Atherosclerosis (ICD10-I70.0). Radiology assistant personnel have been notified to put me in telephone contact with the referring physician or the referring physician's clinical representative in order to discuss these findings. Once this communication is established I will issue an addendum to this report for documentation purposes. Electronically Signed: By: Gaylyn Rong M.D. On: 05/15/2020 19:57   DG Chest 2 View  Result Date: 05/15/2020 CLINICAL DATA:  Chest pain EXAM: CHEST - 2 VIEW COMPARISON:  05/02/2020, CT 05/15/2020, radiograph 04/30/2017 FINDINGS: Right lung is clear. Chronic pleural and parenchymal scarring at the left base. Small moderate left pneumothorax, best seen at the apex, probably grossly stable in size compared with the recent CT. No midline  shift. Stable cardiomediastinal silhouette. Enteral contrast or radiopaque material in the colon. Left seventh rib resection. Left sixth and eighth healing rib fractures. IMPRESSION: Small to moderate left pneumothorax, probably grossly stable compared with CT performed earlier today. Chronic pleural and parenchymal scarring at the left base. Electronically Signed   By: Jasmine Pang M.D.   On: 05/15/2020 21:28   CT Chest Wo Contrast  Addendum Date: 05/15/2020   ADDENDUM REPORT: 05/15/2020 20:09 ADDENDUM: The original report was by Dr. Gaylyn Rong. The following addendum is by Dr. Gaylyn Rong: Critical Value/emergent results were called by telephone at the time of interpretation on 05/15/2020 at 7:59 pm to provider Dr. Tilden Fossa , who verbally acknowledged these results. Electronically Signed   By: Gaylyn Rong M.D.   On: 05/15/2020 20:09   Result Date: 05/15/2020  CLINICAL DATA:  Abdominal pain, vomiting, constipation, fever. Recent diaphragmatic hernia repair. EXAM: CT CHEST, ABDOMEN AND PELVIS WITHOUT CONTRAST TECHNIQUE: Multidetector CT imaging of the chest, abdomen and pelvis was performed following the standard protocol without IV contrast. COMPARISON:  05/11/2020 exam from [facility]. FINDINGS: CT CHEST FINDINGS Cardiovascular: Atherosclerotic calcification of the left subclavian artery and left anterior descending coronary artery. Mild ascending thoracic aortic ectasia at 3.7 cm, without overt aneurysm. Mediastinum/Nodes: Mildly dilated mid to distal esophagus with air-fluid level and adjacent postoperative findings. Mildly complex appearance of the gastroesophageal junction with anterior deviation of the esophagus along the postoperative findings at the gastroesophageal junction. There is previously a more dilated appearance of the distal esophagus which may have represented a hiatal hernia, I am less certain of a hiatal hernia on today's CT. Assessment of this region is  complicated by considerable elevation of the left hemidiaphragm, with postoperative findings along the medial left hemidiaphragm for example on image 44/3. No appreciable pneumomediastinum. Lungs/Pleura: There is a new moderate left pneumothorax, about 20% of left hemithoracic volume similar postoperative findings along the left lung base. No shift of cardiac or mediastinal structures to the right. Musculoskeletal: Left seventh rib osteotomy. Fractures of the left sixth and eighth ribs adjacent to the site of osteotomy, with some early healing response but no bridging. CT ABDOMEN PELVIS FINDINGS Hepatobiliary: Prior cholecystectomy.  Otherwise unremarkable. Pancreas: Unremarkable Spleen: Unremarkable Adrenals/Urinary Tract: Unremarkable Stomach/Bowel: Postoperative findings along the proximal stomach with complex appearance at the gastroesophageal junction possibly from fundoplication. There is oral contrast medium in the distal colon, possibly left over from prior scans. Vascular/Lymphatic: Aortoiliac atherosclerotic vascular disease. Reproductive: Suspected moderate proximal retraction of the testes along the spermatic cords. Other: No supplemental non-categorized findings. Musculoskeletal: Grade 1 degenerative anterolisthesis at L4-5. IMPRESSION: 1. New moderate left pneumothorax, about 20% of left hemithoracic volume. Cause/origin of this pneumothorax uncertain. 2. Stable fractures of the left sixth and eighth ribs adjacent to the site of the left seventh rib osteotomy, with some early healing response but no bony bridging. 3. Mildly dilated mid to distal esophagus with air-fluid level and adjacent postoperative findings at the gastroesophageal junction. The degree of distal esophageal dilatation is less striking than prior, given the complexity of the appearance of the distal esophagus and proximal stomach, it is difficult to completely exclude a small hiatal hernia although hernia is less obvious on the  05/11/2020 CT scan. 4. Elevated left hemidiaphragm with findings suggesting diaphragmatic repair. 5. Aortic atherosclerosis. Coronary atherosclerosis. 6. Suspected moderate proximal retraction of the testes along the spermatic cords. 7. Grade 1 degenerative anterolisthesis at L4-5. Aortic Atherosclerosis (ICD10-I70.0). Radiology assistant personnel have been notified to put me in telephone contact with the referring physician or the referring physician's clinical representative in order to discuss these findings. Once this communication is established I will issue an addendum to this report for documentation purposes. Electronically Signed: By: Gaylyn Rong M.D. On: 05/15/2020 19:57    Assessment/Plan   1. Pneumothorax   - Presents with abdominal pain, N/V, chest pain, and SOB and is found to have left pneumothorax, ~20%  - He is hemodynamically stable with no evidence for tension and no respiratory distress  - CT surgery consulting and much appreciated, recommended chest tube placement by IR  - IR consulted by ED physician for chest tube placement, continue pain-control and monitoring    2. 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 and has been planned for dilation later this month  - There is mild dilation of mid-distal esophagus on CT that appears decreased from scan a few days ago  - Continue supportive care, try advancing diet after chest tube placement if okay with surgery, and can likely be discharged to follow-up as planned for outpatient EGD if able to tolerate adequate oral intake     DVT prophylaxis: SCDs  Code Status: Full  Family Communication: Discussed with patient  Disposition Plan:  Patient is from: Home  Anticipated d/c is to: Home  Anticipated d/c date is: 05/18/20 Patient currently: Pending management of new left pneumothorax    Consults called: CTS consulted by ED physician  Admission status: Inpatient     Briscoe Deutscher, MD [facility] Pager: See [email or web address]  If 7AM-7PM, please contact the daytime attending [email or web address]  05/16/2020, 12:28 AM

## 2020-05-16 NOTE — Progress Notes (Signed)
Patient have not urinated since he has been in ED last night per patient. He tried voiding but was unsuccessful, tried I/O cath but was meeting resistance, Notified Dr. Jonathon Bellows order given for coude cath, was unsuccessful as well X 2 nurses, Dr. Jonathon Bellows notified again and urology consulted. Dr. Laverle Patter on the way to see patient.

## 2020-05-17 ENCOUNTER — Inpatient Hospital Stay (HOSPITAL_COMMUNITY): Payer: Medicare Other

## 2020-05-17 MED ORDER — MORPHINE SULFATE (PF) 2 MG/ML IV SOLN
2.0000 mg | Freq: Once | INTRAVENOUS | Status: AC
  Administered 2020-05-17: 2 mg via INTRAVENOUS
  Filled 2020-05-17: qty 1

## 2020-05-17 MED ORDER — DIATRIZOATE MEGLUMINE & SODIUM 66-10 % PO SOLN
ORAL | Status: AC
  Administered 2020-05-17: 60 mL via ORAL
  Filled 2020-05-17: qty 60

## 2020-05-17 MED ORDER — KATE FARMS STANDARD 1.4 PO LIQD
325.0000 mL | Freq: Two times a day (BID) | ORAL | Status: DC
  Administered 2020-05-17 – 2020-05-19 (×2): 325 mL via ORAL
  Filled 2020-05-17 (×6): qty 325

## 2020-05-17 MED ORDER — CHLORHEXIDINE GLUCONATE CLOTH 2 % EX PADS
6.0000 | MEDICATED_PAD | Freq: Every day | CUTANEOUS | Status: DC
  Administered 2020-05-20: 6 via TOPICAL

## 2020-05-17 MED ORDER — TRAMADOL HCL 50 MG PO TABS: 50.0000 mg | ORAL_TABLET | ORAL | Status: DC | PRN

## 2020-05-17 MED ORDER — PROMETHAZINE HCL 25 MG/ML IJ SOLN
12.5000 mg | INTRAMUSCULAR | Status: DC | PRN
  Administered 2020-05-17 – 2020-05-19 (×7): 12.5 mg via INTRAVENOUS
  Filled 2020-05-17 (×7): qty 1

## 2020-05-17 MED ORDER — DIATRIZOATE MEGLUMINE & SODIUM 66-10 % PO SOLN
120.0000 mL | Freq: Once | ORAL | Status: DC
  Filled 2020-05-17: qty 120

## 2020-05-17 MED ORDER — PRO-STAT SUGAR FREE PO LIQD
30.0000 mL | Freq: Every day | ORAL | Status: DC
  Filled 2020-05-17: qty 30

## 2020-05-17 NOTE — Progress Notes (Signed)
Patient ID: Edwin Hall, male   DOB: [birth year], 54 y.o.   MRN: [id number]   Subjective: Pt doing well overnight without new complaints. Started tamsulosin yesterday.  Objective: Vital signs in last 24 hours: Temp:  [97.5 F (36.4 C)-98.3 F (36.8 C)] 97.5 F (36.4 C) (07/03 0623) Pulse Rate:  [72-87] 72 (07/03 0623) Resp:  [14-19] 14 (07/03 0623) BP: (99-126)/(70-98) 99/70 (07/03 0623) SpO2:  [95 %-100 %] 96 % (07/03 0623) Weight:  [53.9 kg] 53.9 kg (07/02 1444)  Intake/Output from previous day: 07/02 0701 - 07/03 0700 In: 798.8 [P.O.:480; I.V.:318.8] Out: 850 [Urine:850] Intake/Output this shift: No intake/output data recorded.  Physical Exam:  GU: Urine clear  Lab Results: Recent Labs    05/15/20 1838  HGB 15.0  HCT 44.9   BMET Recent Labs    05/15/20 1838  NA 140  K 4.0  CL 104  CO2 24  GLUCOSE 112*  BUN 13  CREATININE 1.14  CALCIUM 9.2     Studies/Results:  Assessment/Plan: 1) Retention: Voiding trial this morning.  If fails, replace catheter and follow up as outpatient.  Catheter placement yesterday with regular 16 Fr Foley was uncomplicated and can be attempted by nursing staff if needed.  Continue tamsulosin.  Will arrange f/u regardless with [place] for 1 month.    LOS: 2 days   Crecencio Mc 05/17/2020, 7:04 AM

## 2020-05-17 NOTE — Progress Notes (Signed)
Initial Nutrition Assessment  RD working remotely.  DOCUMENTATION CODES:   Underweight  INTERVENTION:  - will order [employer] BID, each supplement provides 455 kcal and 20 grams protein. - will order Magic Cup with dinner meals, each supplement provides 290 kcal and 9 grams of protein. - will order 30 mL Prostat once/day, each supplement provides 100 kcal and 15 grams of protein. - diet advancement as medically feasible. - will complete NFPE at follow-up.   NUTRITION DIAGNOSIS:   Inadequate oral intake related to acute illness, nausea, vomiting, other (see comment) (abdominal pain) as evidenced by per patient/family report.  GOAL:   Patient will meet greater than or equal to 90% of their needs  MONITOR:   PO intake, Supplement acceptance, Diet advancement, Labs, Weight trends  REASON FOR ASSESSMENT:   Malnutrition Screening Tool  ASSESSMENT:   54 y.o. male with medical history IBS, HLD, and hiatal hernia s/p surgical repair on 03/17/20 at [facility]. He presented to the ED due to abdominal pain, N/V, chest pain, and SOB. Abdominal pain and N/V have been ongoing since surgery. 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 Chest pain and SOB have been ongoing for several weeks; he was seen at [facility] last month and was found to have a small L-sided pneumothorax which resolved without treatment. No exacerbating or alleviating factors for abdominal pain. He was taking Phenergan without relief of nausea.  Patient is currently out of the room to Diagnostic Radiology. Diet advanced from NPO to FLD yesterday at 1215 and the only documentation of meals is 0% of dinner last night.  Weight on 7/1 was 117 lb and weight on 7/2 was 119 lb. Weight on 05/01/20 was 117 lb. PTA, the most recently documented weight was on 11/27/19 when he weighed 139 lb. This indicates 20 lb  weight loss (14% body weight) in the past 6 months; significant for time frame.  Suspect patient meets criteria for some degree of malnutrition, but unable to confirm at this time.   He has not been seen by a [place] RD at any time in the past.  Per notes: - recurrent L-sided pneumothorax--no plan for intervention - GI following--esophagogram today   Labs reviewed. Medications reviewed; 40 mg oral protonix BID, 100 mcg oral cyanocobalamin/day, 220 mg zinc sulfate/day.  IVF; NS @ 50 ml/hr.    NUTRITION - FOCUSED PHYSICAL EXAM:  unable to complete at this time.   Diet Order:   Diet Order            Diet full liquid Room service appropriate? Yes; Fluid consistency: Thin  Diet effective now                 EDUCATION NEEDS:   No education needs have been identified at this time  Skin:  Skin Assessment: Reviewed RN Assessment  Last BM:  unknown  Height:   Ht Readings from Last 1 Encounters:  05/16/20 5\' 8"  (1.727 m)    Weight:   Wt Readings from Last 1 Encounters:  05/16/20 53.9 kg     Estimated Nutritional Needs:  Kcal:  1800-2000 kcal Protein:  90-105 grams Fluid:  >/= 2 L/day     07/17/20, MS, RD, LDN, CNSC Inpatient Clinical Dietitian RD pager # available in [facility]  After hours/weekend pager # available in [facility]

## 2020-05-17 NOTE — H&P (View-Only) (Signed)
[facility] Consult  Referring Provider: [facility] Primary Care Physician:  Charlott RakesHodges, Francisco, MD Primary Gastroenterologist: Gentry FitzUnassigned  Reason for Consultation:  Nausea, vomiting,weight loss  HPI: Edwin Hall is a 54 y.o. male with history of hiatal hernia repair at [facility] in [place] on 03/17/2020 was admitted on 05/15/2020 with abdominal pain, nausea, vomiting, chest pain, shortness of breath, inability to have a bowel movement or pass gas and 23 pound weight loss since surgery. The procedure was performed by left thoracotomy approach as patient states he has had multiple adhesions from prior hiatal hernia repair about 7 years ago.  Patient states that since surgery he has had extreme difficulty swallowing solids as well as liquids which has caused significant weight loss. He states he had a barium study performed at [facility] and was noted to have a narrowing at GE junction with plans to perform endoscopy with dilation on 05/26/2020 but could not wait until then and decided to come to the hospital.  He was found to be slightly tachypneic and had a CAT scan of the chest abdomen and pelvis which showed new/recurrent left pneumothorax, with mildly dilated mid to distal esophagus with air-fluid level and HSM postoperative findings, mildly complex appearance of GE junction with anterior deviation of esophagus no appreciable pneumo mediastinum.  He was evaluated by Dr. Cliffton AstersLightfoot from cardiothoracic surgery and was recommended conservative management.   Past Medical History:  Diagnosis Date  . Food allergy   . Headache   . Hiatal hernia   . Hyperlipidemia   . IBS (irritable bowel syndrome)   . Iron deficiency anemia   . Urticaria     Past Surgical History:  Procedure Laterality Date  . ABDOMINAL SURGERY  12/2015   pylorus plasty  . APPENDECTOMY  1984  . CHOLECYSTECTOMY  2008  . GASTROSTOMY TUBE PLACEMENT    . HIATAL HERNIA REPAIR  07/2015  . TONSILLECTOMY  1980  .  VENTRAL HERNIA REPAIR  2010    Prior to Admission medications   Medication Sig Start Date End Date Taking? Authorizing Provider  aspirin 81 MG EC tablet Take 81 mg by mouth daily. Swallow whole.   Yes [provider]  Cholecalciferol (VITAMIN D3 PO) Take 250 mcg by mouth daily.    Yes [provider]  Cyanocobalamin (VITAMIN B 12 PO) Take 1 capsule by mouth daily.   Yes [provider]  Ferrous Sulfate (IRON PO) Take 65 mg by mouth daily.    Yes [provider]  Ferrous Sulfate (IRON) 325 (65 Fe) MG TABS Take 325 mg by mouth daily.  05/12/20  Yes [provider]  gabapentin (NEURONTIN) 300 MG capsule Take 300 mg by mouth daily.  03/29/20  Yes [provider]  Ginkgo Biloba 40 MG TABS Take 40 mg by mouth daily.    Yes [provider]  ipratropium (ATROVENT) 0.06 % nasal spray USE TWO SPRAYS IN EACH NOSTRIL TWICE DAILY AS DIRECTED. Patient taking differently: Place 2 sprays into both nostrils daily as needed for rhinitis.  05/08/20  Yes Ambs, Norvel RichardsAnne M, FNP  mirtazapine (REMERON) 15 MG tablet Take 15 mg by mouth at bedtime. 02/08/20  Yes [provider]  Multiple Vitamin (MULTIVITAMIN WITH MINERALS) TABS tablet Take 1 tablet by mouth daily.   Yes [provider]  pantoprazole (PROTONIX) 40 MG tablet Take 40 mg by mouth 2 (two) times daily. 09/10/19  Yes [provider]  promethazine (PHENERGAN) 25 MG tablet Take 25 mg by mouth every 6 (  six) hours as needed for nausea or vomiting.  04/01/20  Yes [provider]  Zinc 50 MG TABS Take 50 mg by mouth daily.    Yes [provider]  azelastine (ASTELIN) 0.1 % nasal spray 2 sprays per nostril twice a day for control of nasal drainage. Patient not taking: Reported on 05/02/2020 11/27/19 05/02/20  Marcelyn Bruins, MD    Current Facility-Administered Medications  Medication Dose Route Frequency Provider Last Rate Last Admin  . 0.9 %  sodium  chloride infusion   Intravenous Continuous Lanae Boast, MD 50 mL/hr at 05/17/20 0046 New Bag at 05/17/20 0046  . aspirin EC tablet 81 mg  81 mg Oral Daily Kc, Ramesh, MD   81 mg at 05/17/20 0945  . cholecalciferol (VITAMIN D) tablet 10,000 Units  250 mcg Oral Daily Lanae Boast, MD   10,000 Units at 05/17/20 0946  . diphenhydrAMINE (BENADRYL) injection 12.5 mg  12.5 mg Intravenous Q6H PRN Lanae Boast, MD   12.5 mg at 05/17/20 0812  . gabapentin (NEURONTIN) capsule 300 mg  300 mg Oral Daily Kc, Ramesh, MD      . ipratropium (ATROVENT) 0.06 % nasal spray 2 spray  2 spray Each Nare Daily PRN Kc, Ramesh, MD      . mirtazapine (REMERON) tablet 15 mg  15 mg Oral QHS Kc, Dayna Barker, MD   15 mg at 05/16/20 2108  . morphine 2 MG/ML injection 2 mg  2 mg Intravenous Once Kc, Ramesh, MD      . morphine 2 MG/ML injection 2-4 mg  2-4 mg Intravenous Q4H PRN Opyd, Lavone Neri, MD   4 mg at 05/17/20 7106  . multivitamin with minerals tablet 1 tablet  1 tablet Oral Daily Lanae Boast, MD   1 tablet at 05/17/20 0944  . pantoprazole (PROTONIX) EC tablet 40 mg  40 mg Oral BID Kc, Ramesh, MD   40 mg at 05/17/20 0945  . promethazine (PHENERGAN) injection 12.5 mg  12.5 mg Intravenous Q4H PRN Kc, Ramesh, MD      . promethazine (PHENERGAN) tablet 25 mg  25 mg Oral Q6H PRN Kc, Ramesh, MD      . tamsulosin (FLOMAX) capsule 0.4 mg  0.4 mg Oral QPC breakfast Kc, Ramesh, MD   0.4 mg at 05/17/20 0945  . traMADol (ULTRAM) tablet 50 mg  50 mg Oral Q4H PRN Kc, Ramesh, MD      . vitamin B-12 (CYANOCOBALAMIN) tablet 100 mcg  100 mcg Oral Daily Kc, Ramesh, MD   100 mcg at 05/17/20 0945  . zinc sulfate capsule 220 mg  220 mg Oral Daily Kc, Ramesh, MD   220 mg at 05/17/20 0944    Allergies as of 05/15/2020 - Review Complete 05/15/2020  Allergen Reaction Noted  . Other Anaphylaxis 10/29/2015  . Shellfish allergy Anaphylaxis 10/29/2015  . Iodine Hives 05/14/2017  . Tetracyclines & related Itching 10/29/2015  . Zofran [ondansetron hcl] Itching  and Rash 10/29/2015    Family History  Problem Relation Age of Onset  . Parkinson's disease Father   . Heart disease Father   . Angioedema Mother   . Allergic rhinitis Neg Hx   . Asthma Neg Hx   . Eczema Neg Hx   . Immunodeficiency Neg Hx   . Urticaria Neg Hx     Social History   Socioeconomic History  . Marital status: Single    Spouse name: Not on file  . Number of children: Not on file  . Years of  education: Not on file  . Highest education level: Not on file  Occupational History  . Not on file  Tobacco Use  . Smoking status: Never Smoker  . Smokeless tobacco: Never Used  Vaping Use  . Vaping Use: Never used  Substance and Sexual Activity  . Alcohol use: No  . Drug use: No  . Sexual activity: Not on file  Other Topics Concern  . Not on file  Social History Narrative  . Not on file   Social Determinants of Health   Financial Resource Strain:   . Difficulty of Paying Living Expenses:   Food Insecurity:   . Worried About Programme researcher, broadcasting/film/video in the Last Year:   . Barista in the Last Year:   Transportation Needs:   . Freight forwarder (Medical):   Marland Kitchen Lack of Transportation (Non-Medical):   Physical Activity:   . Days of Exercise per Week:   . Minutes of Exercise per Session:   Stress:   . Feeling of Stress :   Social Connections:   . Frequency of Communication with Friends and Family:   . Frequency of Social Gatherings with Friends and Family:   . Attends Religious Services:   . Active Member of Clubs or Organizations:   . Attends Banker Meetings:   Marland Kitchen Marital Status:   Intimate Partner Violence:   . Fear of Current or Ex-Partner:   . Emotionally Abused:   Marland Kitchen Physically Abused:   . Sexually Abused:     Review of Systems: Positive for: GI: Described in detail in HPI.    Gen: anorexia, fatigue, weakness, malaise, involuntary weight loss, Denies any fever, chills, rigors, night sweats, and sleep disorder CV: Denies chest pain,  angina, palpitations, syncope, orthopnea, PND, peripheral edema, and claudication. Resp: Denies dyspnea, cough, sputum, wheezing, coughing up blood. GU : Denies urinary burning, blood in urine, urinary frequency, urinary hesitancy, nocturnal urination, and urinary incontinence. MS: Denies joint pain or swelling.  Denies muscle weakness, cramps, atrophy.  Derm: Denies rash, itching, oral ulcerations, hives, unhealing ulcers.  Psych: Denies depression, anxiety, memory loss, suicidal ideation, hallucinations,  and confusion. Heme: Denies bruising, bleeding, and enlarged lymph nodes. Neuro:  Denies any headaches, dizziness, paresthesias. Endo:  Denies any problems with DM, thyroid, adrenal function.  Physical Exam: Vital signs in last 24 hours: Temp:  [97.5 F (36.4 C)-98.3 F (36.8 C)] 97.5 F (36.4 C) (07/03 0623) Pulse Rate:  [72-87] 72 (07/03 0623) Resp:  [14-17] 14 (07/03 0623) BP: (99-126)/(70-98) 99/70 (07/03 0623) SpO2:  [95 %-100 %] 96 % (07/03 6578) Weight:  [53.9 kg] 53.9 kg (07/02 1444) Last BM Date: 05/16/20  General:   Alert,  Well-developed, thinly built, pleasant and cooperative in NAD Head:  Normocephalic and atraumatic. Eyes:  Sclera clear, no icterus.   Conjunctiva pink. Ears:  Normal auditory acuity. Nose:  No deformity, discharge,  or lesions. Mouth:  No deformity or lesions.  Oropharynx pink & moist. Neck:  Supple; no masses or thyromegaly. Lungs: Left thoracotomy scar, clear throughout to auscultation.   No wheezes, crackles, or rhonchi. No acute distress. Heart:  Regular rate and rhythm; no murmurs, clicks, rubs,  or gallops. Extremities:  Without clubbing or edema. Neurologic:  Alert and  oriented x4;  grossly normal neurologically. Skin:  Intact without significant lesions or rashes. Psych:  Alert and cooperative. Normal mood and affect. Abdomen:  Soft and nondistended.  Midline surgical scar appears well-healed, mild generalized tenderness  Lab  Results: Recent Labs    05/15/20 1838  WBC 5.9  HGB 15.0  HCT 44.9  PLT 223   BMET Recent Labs    05/15/20 1838  NA 140  K 4.0  CL 104  CO2 24  GLUCOSE 112*  BUN 13  CREATININE 1.14  CALCIUM 9.2   LFT Recent Labs    05/15/20 1838  PROT 7.2  ALBUMIN 4.1  AST 26  ALT 19  ALKPHOS 64  BILITOT 1.0   PT/INR Recent Labs    05/16/20 0853  LABPROT 13.7  INR 1.1    Studies/Results: CT Abdomen Pelvis Wo Contrast  Addendum Date: 05/15/2020   ADDENDUM REPORT: 05/15/2020 20:09 ADDENDUM: The original report was by Dr. Gaylyn Rong. The following addendum is by Dr. Gaylyn Rong: Critical Value/emergent results were called by telephone at the time of interpretation on 05/15/2020 at 7:59 pm to provider Dr. Tilden Fossa , who verbally acknowledged these results. Electronically Signed   By: Gaylyn Rong M.D.   On: 05/15/2020 20:09   Result Date: 05/15/2020 CLINICAL DATA:  Abdominal pain, vomiting, constipation, fever. Recent diaphragmatic hernia repair. EXAM: CT CHEST, ABDOMEN AND PELVIS WITHOUT CONTRAST TECHNIQUE: Multidetector CT imaging of the chest, abdomen and pelvis was performed following the standard protocol without IV contrast. COMPARISON:  05/11/2020 exam from [facility]. FINDINGS: CT CHEST FINDINGS Cardiovascular: Atherosclerotic calcification of the left subclavian artery and left anterior descending coronary artery. Mild ascending thoracic aortic ectasia at 3.7 cm, without overt aneurysm. Mediastinum/Nodes: Mildly dilated mid to distal esophagus with air-fluid level and adjacent postoperative findings. Mildly complex appearance of the gastroesophageal junction with anterior deviation of the esophagus along the postoperative findings at the gastroesophageal junction. There is previously a more dilated appearance of the distal esophagus which may have represented a hiatal hernia, I am less certain of a hiatal hernia on today's CT. Assessment of this region  is complicated by considerable elevation of the left hemidiaphragm, with postoperative findings along the medial left hemidiaphragm for example on image 44/3. No appreciable pneumomediastinum. Lungs/Pleura: There is a new moderate left pneumothorax, about 20% of left hemithoracic volume similar postoperative findings along the left lung base. No shift of cardiac or mediastinal structures to the right. Musculoskeletal: Left seventh rib osteotomy. Fractures of the left sixth and eighth ribs adjacent to the site of osteotomy, with some early healing response but no bridging. CT ABDOMEN PELVIS FINDINGS Hepatobiliary: Prior cholecystectomy.  Otherwise unremarkable. Pancreas: Unremarkable Spleen: Unremarkable Adrenals/Urinary Tract: Unremarkable Stomach/Bowel: Postoperative findings along the proximal stomach with complex appearance at the gastroesophageal junction possibly from fundoplication. There is oral contrast medium in the distal colon, possibly left over from prior scans. Vascular/Lymphatic: Aortoiliac atherosclerotic vascular disease. Reproductive: Suspected moderate proximal retraction of the testes along the spermatic cords. Other: No supplemental non-categorized findings. Musculoskeletal: Grade 1 degenerative anterolisthesis at L4-5. IMPRESSION: 1. New moderate left pneumothorax, about 20% of left hemithoracic volume. Cause/origin of this pneumothorax uncertain. 2. Stable fractures of the left sixth and eighth ribs adjacent to the site of the left seventh rib osteotomy, with some early healing response but no bony bridging. 3. Mildly dilated mid to distal esophagus with air-fluid level and adjacent postoperative findings at the gastroesophageal junction. The degree of distal esophageal dilatation is less striking than prior, given the complexity of the appearance of the distal esophagus and proximal stomach, it is difficult to completely exclude a small hiatal hernia although hernia is less obvious on the  05/11/2020 CT scan. 4. Elevated  left hemidiaphragm with findings suggesting diaphragmatic repair. 5. Aortic atherosclerosis. Coronary atherosclerosis. 6. Suspected moderate proximal retraction of the testes along the spermatic cords. 7. Grade 1 degenerative anterolisthesis at L4-5. Aortic Atherosclerosis (ICD10-I70.0). Radiology assistant personnel have been notified to put me in telephone contact with the referring physician or the referring physician's clinical representative in order to discuss these findings. Once this communication is established I will issue an addendum to this report for documentation purposes. Electronically Signed: By: Gaylyn Rong M.D. On: 05/15/2020 19:57   DG Chest 2 View  Result Date: 05/17/2020 CLINICAL DATA:  Status post esophageal surgery several days prior, pneumothorax. EXAM: CHEST - 2 VIEW COMPARISON:  May 15, 2020 FINDINGS: Small to moderate left apical pneumothorax is stable compared to prior exam. Small left pleural effusion is identified. The right lung is clear. The mediastinal contour and cardiac silhouette are stable. Bony structures are stable. There is evidence of prior left lateral lower rib healing fracture. IMPRESSION: Persistent small to moderate left apical pneumothorax unchanged. Small left pleural effusion. Electronically Signed   By: Sherian Rein M.D.   On: 05/17/2020 08:16   DG Chest 2 View  Result Date: 05/15/2020 CLINICAL DATA:  Chest pain EXAM: CHEST - 2 VIEW COMPARISON:  05/02/2020, CT 05/15/2020, radiograph 04/30/2017 FINDINGS: Right lung is clear. Chronic pleural and parenchymal scarring at the left base. Small moderate left pneumothorax, best seen at the apex, probably grossly stable in size compared with the recent CT. No midline shift. Stable cardiomediastinal silhouette. Enteral contrast or radiopaque material in the colon. Left seventh rib resection. Left sixth and eighth healing rib fractures. IMPRESSION: Small to moderate left  pneumothorax, probably grossly stable compared with CT performed earlier today. Chronic pleural and parenchymal scarring at the left base. Electronically Signed   By: Jasmine Pang M.D.   On: 05/15/2020 21:28   CT Chest Wo Contrast  Addendum Date: 05/15/2020   ADDENDUM REPORT: 05/15/2020 20:09 ADDENDUM: The original report was by Dr. Gaylyn Rong. The following addendum is by Dr. Gaylyn Rong: Critical Value/emergent results were called by telephone at the time of interpretation on 05/15/2020 at 7:59 pm to provider Dr. Tilden Fossa , who verbally acknowledged these results. Electronically Signed   By: Gaylyn Rong M.D.   On: 05/15/2020 20:09   Result Date: 05/15/2020 CLINICAL DATA:  Abdominal pain, vomiting, constipation, fever. Recent diaphragmatic hernia repair. EXAM: CT CHEST, ABDOMEN AND PELVIS WITHOUT CONTRAST TECHNIQUE: Multidetector CT imaging of the chest, abdomen and pelvis was performed following the standard protocol without IV contrast. COMPARISON:  05/11/2020 exam from [facility]. FINDINGS: CT CHEST FINDINGS Cardiovascular: Atherosclerotic calcification of the left subclavian artery and left anterior descending coronary artery. Mild ascending thoracic aortic ectasia at 3.7 cm, without overt aneurysm. Mediastinum/Nodes: Mildly dilated mid to distal esophagus with air-fluid level and adjacent postoperative findings. Mildly complex appearance of the gastroesophageal junction with anterior deviation of the esophagus along the postoperative findings at the gastroesophageal junction. There is previously a more dilated appearance of the distal esophagus which may have represented a hiatal hernia, I am less certain of a hiatal hernia on today's CT. Assessment of this region is complicated by considerable elevation of the left hemidiaphragm, with postoperative findings along the medial left hemidiaphragm for example on image 44/3. No appreciable pneumomediastinum. Lungs/Pleura: There  is a new moderate left pneumothorax, about 20% of left hemithoracic volume similar postoperative findings along the left lung base. No shift of cardiac or mediastinal structures to the right. Musculoskeletal: Left  seventh rib osteotomy. Fractures of the left sixth and eighth ribs adjacent to the site of osteotomy, with some early healing response but no bridging. CT ABDOMEN PELVIS FINDINGS Hepatobiliary: Prior cholecystectomy.  Otherwise unremarkable. Pancreas: Unremarkable Spleen: Unremarkable Adrenals/Urinary Tract: Unremarkable Stomach/Bowel: Postoperative findings along the proximal stomach with complex appearance at the gastroesophageal junction possibly from fundoplication. There is oral contrast medium in the distal colon, possibly left over from prior scans. Vascular/Lymphatic: Aortoiliac atherosclerotic vascular disease. Reproductive: Suspected moderate proximal retraction of the testes along the spermatic cords. Other: No supplemental non-categorized findings. Musculoskeletal: Grade 1 degenerative anterolisthesis at L4-5. IMPRESSION: 1. New moderate left pneumothorax, about 20% of left hemithoracic volume. Cause/origin of this pneumothorax uncertain. 2. Stable fractures of the left sixth and eighth ribs adjacent to the site of the left seventh rib osteotomy, with some early healing response but no bony bridging. 3. Mildly dilated mid to distal esophagus with air-fluid level and adjacent postoperative findings at the gastroesophageal junction. The degree of distal esophageal dilatation is less striking than prior, given the complexity of the appearance of the distal esophagus and proximal stomach, it is difficult to completely exclude a small hiatal hernia although hernia is less obvious on the 05/11/2020 CT scan. 4. Elevated left hemidiaphragm with findings suggesting diaphragmatic repair. 5. Aortic atherosclerosis. Coronary atherosclerosis. 6. Suspected moderate proximal retraction of the testes along the  spermatic cords. 7. Grade 1 degenerative anterolisthesis at L4-5. Aortic Atherosclerosis (ICD10-I70.0). Radiology assistant personnel have been notified to put me in telephone contact with the referring physician or the referring physician's clinical representative in order to discuss these findings. Once this communication is established I will issue an addendum to this report for documentation purposes. Electronically Signed: By: Gaylyn Rong M.D. On: 05/15/2020 19:57    Impression: Hiatal hernia repaired via left thoracic approach on 03/17/2020 Pneumothorax noted on CAT scan Persistent nausea, vomiting, difficulty swallowing solids and liquids Unintentional weight loss  Plan: Esophagogram with thin barium or Gastrografin to be performed today. If obvious stricture or narrowing noted plan endoscopy with dilation in a.m. The risks and the benefits of the procedure were discussed with the patient in details. He understands and verbalizes consent.   LOS: 2 days   Kerin Salen, MD  05/17/2020, 11:37 AM

## 2020-05-17 NOTE — Progress Notes (Signed)
PROGRESS NOTE    Edwin Hall  PIR:[id number] DOB: 03/10/1966 DOA: 05/15/2020 PCP: Charlott Rakes, MD   Chef Complaints: chest pain  Brief Narrative: 54 y.o. male with medical history significant for irritable bowel syndrome, hyperlipidemia, and hiatal hernia status post surgical repair at [facility] in [place] on 03/17/2020, now presenting to emergency department with abdominal pain, nausea, vomiting, chest pain, and shortness of breath.  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  He continues to have the symptoms and then developed chest pain and shortness of breath a couple weeks ago for which she was seen at [facility] last month and found to have a small left-sided pneumothorax that resolved without treatment.  His abdominal pain, nausea, and vomiting began to worsen over the past few days and he has developed recurrent pain in the left chest, shortness of breath, and mild nonproductive cough.  Abdominal pain is mainly in the left lower and mid abdomen without alleviating or exacerbating factors.  He denies any diarrhea, melena, or hematochezia.  He has been taking Phenergan at home with some relief.  He reports history of childhood asthma but has not used an inhaler in many years and denies any history of smoking or COPD.  ED Course: Upon arrival to the ED, patient is found to be afebrile, saturating well on room air, slightly tachypneic, and with stable blood pressure.  Chemistry panel and CBC are unremarkable, troponin undetectable, and urinalysis with 20 ketones.  CT of the chest/abdomen/pelvis is concerning for new/recurrent left pneumothorax, approximately 20%, stable left sixth and eighth rib fractures adjacent to seventh rib osteotomy, and mildly  dilated mid to distal esophagus with air-fluid level that appears decreased from CT several days ago.  Patient was given a liter of saline, multiple doses of morphine and Phenergan, and cardiothoracic surgery was consulted by the ED physician.  Surgeon recommended a medical admission  Subjective: This morning complains of persistent nausea, also no bowel movement no flatus but has IBS.  No vomiting but nursing reports intermittent gagging. Remains on room air.  Assessment & Plan:  Pneumothorax recurrent, in the setting of left thoracotomy for hiatal hernia repair at [facility] last month, small to moderate-sized pneumothorax present-no evidence of tension, I discussed with Dr. Cliffton Asters who has reviewed the imaging and did not advise chest tube insertion at this time and monitoring closely chest x-ray this morning shows stable pneumothorax.  Plan is for outpatient follow-up based on CT surgery consultation. Patient is asymptomatic.  He does have 6/7 rib # post op.  Nausea & vomiting abdominal pain.Unclear etiology has IBS.Recent diaphragmatic  hernia surgery in May at [facility].  CT abdomen on admission"mildly dilated mid to distal esophagus with air-fluid level and adjacent postoperative findings at the gastroesophageal junction.The degree of distal esophageal dilatation is less striking than prior, given the complexity of the appearance of the distal esophagus and proximal stomach, it is difficult to completely exclude a small hiatal hernia although hernia is less obvious on the 05/11/2020 CT scan. 4. Elevated left hemidiaphragm with findings suggesting diaphragmatic repair."  Due to persistent symptoms GI consulted given question of a stricture in his recent swallow eval and patient reports he was a scheduled to get EGD dilatation 12 July.  Continue medical management nausea medication pain medication.  DVT  prophylaxis:SCD Code Status: full Family Communication: plan of care discussed with patient at  bedside.  Status is: Inpatient  Remains inpatient appropriate because:IV treatments appropriate due to intensity of illness or inability to take PO, Inpatient level of care appropriate due to severity of illness and For monitoring of the pneumothorax, for diet tolerance   Dispo: The patient is from: Home              Anticipated d/c is to: Home              Anticipated d/c date is: 2 days              Patient currently is not medically stable to d/c.  Awaiting for resolution of nausea vomiting prior to discharge.  Nutrition: Diet Order            Diet full liquid Room service appropriate? Yes; Fluid consistency: Thin  Diet effective now                Consultants:see note  Procedures:see note Microbiology:see note  Medications: Scheduled Meds: . aspirin EC  81 mg Oral Daily  . cholecalciferol  250 mcg Oral Daily  . diatrizoate meglumine-sodium  120 mL Oral Once  . gabapentin  300 mg Oral Daily  . mirtazapine  15 mg Oral QHS  . multivitamin with minerals  1 tablet Oral Daily  . pantoprazole  40 mg Oral BID  . tamsulosin  0.4 mg Oral QPC breakfast  . vitamin B-12  100 mcg Oral Daily  . zinc sulfate  220 mg Oral Daily   Continuous Infusions: . sodium chloride 50 mL/hr at 05/17/20 0046    Antimicrobials: Anti-infectives (From admission, onward)   None       Objective: Vitals: Today's Vitals   05/17/20 0230 05/17/20 0623 05/17/20 0804 05/17/20 1135  BP: 117/89 99/70    Pulse: 79 72    Resp: 15 14    Temp: 97.8 F (36.6 C) (!) 97.5 F (36.4 C)    TempSrc: Oral Oral    SpO2: 96% 96%    Weight:      Height:      PainSc:   8  10-Worst pain ever    Intake/Output Summary (Last 24 hours) at 05/17/2020 1335 Last data filed at 05/17/2020 1140 Gross per 24 hour  Intake 798.75 ml  Output 1175 ml  Net -376.25 ml   Filed Weights   05/15/20 1808 05/16/20 1444  Weight: 53.1 kg 53.9 kg   Weight change: 0.816 kg   Intake/Output from previous day: 07/02 0701 -  07/03 0700 In: 798.8 [P.O.:480; I.V.:318.8] Out: 850 [Urine:850] Intake/Output this shift: Total I/O In: -  Out: 325 [Urine:325]  Examination:  General exam: AAO X 3, NAD, nauseous, weak appearing. HEENT:Oral mucosa moist, Ear/Nose WNL grossly, dentition normal. Respiratory system: bilaterally clear,no wheezing or crackles,no use of accessory muscle Cardiovascular system: S1 & S2 +, No JVD,. Gastrointestinal system: Abdomen soft, mildly tender epigastric area NT,ND, BS+ Nervous System:Alert, awake, moving extremities and grossly nonfocal Extremities: No edema, distal peripheral pulses palpable.  Skin: No rashes,no icterus. MSK: Normal muscle bulk,tone, power  Data Reviewed: I have personally reviewed following labs and imaging studies CBC: Recent Labs  Lab 05/15/20 1838  WBC 5.9  HGB 15.0  HCT 44.9  MCV 94.9  PLT 223   Basic Metabolic Panel: Recent Labs  Lab 05/15/20 1838  NA 140  K 4.0  CL 104  CO2 24  GLUCOSE 112*  BUN  13  CREATININE 1.14  CALCIUM 9.2   GFR: Estimated Creatinine Clearance: 57.8 mL/min (by C-G formula based on SCr of 1.14 mg/dL). Liver Function Tests: Recent Labs  Lab 05/15/20 1838  AST 26  ALT 19  ALKPHOS 64  BILITOT 1.0  PROT 7.2  ALBUMIN 4.1   Recent Labs  Lab 05/15/20 1838  LIPASE 40   No results for input(s): AMMONIA in the last 168 hours. Coagulation Profile: Recent Labs  Lab 05/16/20 0853  INR 1.1   Cardiac Enzymes: No results for input(s): CKTOTAL, CKMB, CKMBINDEX, TROPONINI in the last 168 hours. BNP (last 3 results) No results for input(s): PROBNP in the last 8760 hours. HbA1C: No results for input(s): HGBA1C in the last 72 hours. CBG: No results for input(s): GLUCAP in the last 168 hours. Lipid Profile: No results for input(s): CHOL, HDL, LDLCALC, TRIG, CHOLHDL, LDLDIRECT in the last 72 hours. Thyroid Function Tests: No results for input(s): TSH, T4TOTAL, FREET4, T3FREE, THYROIDAB in the last 72  hours. Anemia Panel: No results for input(s): VITAMINB12, FOLATE, FERRITIN, TIBC, IRON, RETICCTPCT in the last 72 hours. Sepsis Labs: No results for input(s): PROCALCITON, LATICACIDVEN in the last 168 hours.  Recent Results (from the past 240 hour(s))  SARS Coronavirus 2 by RT PCR (hospital order, performed in [facility] hospital lab) Nasopharyngeal Nasopharyngeal Swab     Status: None   Collection Time: 05/15/20 11:34 PM   Specimen: Nasopharyngeal Swab  Result Value Ref Range Status   SARS Coronavirus 2 NEGATIVE NEGATIVE Final    Comment: (NOTE) SARS-CoV-2 target nucleic acids are NOT DETECTED.  The SARS-CoV-2 RNA is generally detectable in upper and lower respiratory specimens during the acute phase of infection. The lowest concentration of SARS-CoV-2 viral copies this assay can detect is 250 copies / mL. A negative result does not preclude SARS-CoV-2 infection and should not be used as the sole basis for treatment or other patient management decisions.  A negative result may occur with improper specimen collection / handling, submission of specimen other than nasopharyngeal swab, presence of viral mutation(s) within the areas targeted by this assay, and inadequate number of viral copies (<250 copies / mL). A negative result must be combined with clinical observations, patient history, and epidemiological information.  Fact Sheet for Patients:   BoilerBrush.com.cy  Fact Sheet for Healthcare Providers: https://pope.com/  This test is not yet approved or  cleared by the [place] FDA and has been authorized for detection and/or diagnosis of SARS-CoV-2 by FDA under an Emergency Use Authorization (EUA).  This EUA will remain in effect (meaning this test can be used) for the duration of the COVID-19 declaration under Section 564(b)(1) of the Act, 21 U.S.C. section 360bbb-3(b)(1), unless the authorization is terminated or revoked  sooner.  Performed at [facility], [address], [place], [place] [address]       Radiology Studies: CT Abdomen Pelvis Wo Contrast  Addendum Date: 05/15/2020   ADDENDUM REPORT: 05/15/2020 20:09 ADDENDUM: The original report was by Dr. Gaylyn Rong. The following addendum is by Dr. Gaylyn Rong: Critical Value/emergent results were called by telephone at the time of interpretation on 05/15/2020 at 7:59 pm to provider Dr. Tilden Fossa , who verbally acknowledged these results. Electronically Signed   By: Gaylyn Rong M.D.   On: 05/15/2020 20:09   Result Date: 05/15/2020 CLINICAL DATA:  Abdominal pain, vomiting, constipation, fever. Recent diaphragmatic hernia repair. EXAM: CT CHEST, ABDOMEN AND PELVIS WITHOUT CONTRAST TECHNIQUE: Multidetector CT imaging of the  chest, abdomen and pelvis was performed following the standard protocol without IV contrast. COMPARISON:  05/11/2020 [facility] from [facility] FINDINGS: CT CHEST FINDINGS Cardiovascular: Atherosclerotic calcification of the left subclavian artery and left anterior descending coronary artery. Mild ascending thoracic aortic ectasia at 3.7 cm, without overt aneurysm. Mediastinum/Nodes: Mildly dilated mid to distal esophagus with air-fluid level and adjacent postoperative findings. Mildly complex appearance of the gastroesophageal junction with anterior deviation of the esophagus along the postoperative findings at the gastroesophageal junction. There is previously a more dilated appearance of the distal esophagus which may have represented a hiatal hernia, I am less certain of a hiatal hernia on today's CT. Assessment of this region is complicated by considerable elevation of the left hemidiaphragm, with postoperative findings along the medial left hemidiaphragm for example on image 44/3. No appreciable pneumomediastinum. Lungs/Pleura: There is a new moderate left pneumothorax, about 20% of left hemithoracic  volume similar postoperative findings along the left lung base. No shift of cardiac or mediastinal structures to the right. Musculoskeletal: Left seventh rib osteotomy. Fractures of the left sixth and eighth ribs adjacent to the site of osteotomy, with some early healing response but no bridging. CT ABDOMEN PELVIS FINDINGS Hepatobiliary: Prior cholecystectomy.  Otherwise unremarkable. Pancreas: Unremarkable Spleen: Unremarkable Adrenals/Urinary Tract: Unremarkable Stomach/Bowel: Postoperative findings along the proximal stomach with complex appearance at the gastroesophageal junction possibly from fundoplication. There is oral contrast medium in the distal colon, possibly left over from prior scans. Vascular/Lymphatic: Aortoiliac atherosclerotic vascular disease. Reproductive: Suspected moderate proximal retraction of the testes along the spermatic cords. Other: No supplemental non-categorized findings. Musculoskeletal: Grade 1 degenerative anterolisthesis at L4-5. IMPRESSION: 1. New moderate left pneumothorax, about 20% of left hemithoracic volume. Cause/origin of this pneumothorax uncertain. 2. Stable fractures of the left sixth and eighth ribs adjacent to the site of the left seventh rib osteotomy, with some early healing response but no bony bridging. 3. Mildly dilated mid to distal esophagus with air-fluid level and adjacent postoperative findings at the gastroesophageal junction. The degree of distal esophageal dilatation is less striking than prior, given the complexity of the appearance of the distal esophagus and proximal stomach, it is difficult to completely exclude a small hiatal hernia although hernia is less obvious on the 05/11/2020 CT scan. 4. Elevated left hemidiaphragm with findings suggesting diaphragmatic repair. 5. Aortic atherosclerosis. Coronary atherosclerosis. 6. Suspected moderate proximal retraction of the testes along the spermatic cords. 7. Grade 1 degenerative anterolisthesis at L4-5.  Aortic Atherosclerosis (ICD10-I70.0). Radiology assistant personnel have been notified to put me in telephone contact with the referring physician or the referring physician's clinical representative in order to discuss these findings. Once this communication is established I will issue an addendum to this report for documentation purposes. Electronically Signed: By: Gaylyn Rong M.D. On: 05/15/2020 19:57   DG Chest 2 View  Result Date: 05/17/2020 CLINICAL DATA:  Status post esophageal surgery several days prior, pneumothorax. EXAM: CHEST - 2 VIEW COMPARISON:  May 15, 2020 FINDINGS: Small to moderate left apical pneumothorax is stable compared to prior exam. Small left pleural effusion is identified. The right lung is clear. The mediastinal contour and cardiac silhouette are stable. Bony structures are stable. There is evidence of prior left lateral lower rib healing fracture. IMPRESSION: Persistent small to moderate left apical pneumothorax unchanged. Small left pleural effusion. Electronically Signed   By: Sherian Rein M.D.   On: 05/17/2020 08:16   DG Chest 2 View  Result Date: 05/15/2020 CLINICAL DATA:  Chest pain EXAM:  CHEST - 2 VIEW COMPARISON:  05/02/2020, CT 05/15/2020, radiograph 04/30/2017 FINDINGS: Right lung is clear. Chronic pleural and parenchymal scarring at the left base. Small moderate left pneumothorax, best seen at the apex, probably grossly stable in size compared with the recent CT. No midline shift. Stable cardiomediastinal silhouette. Enteral contrast or radiopaque material in the colon. Left seventh rib resection. Left sixth and eighth healing rib fractures. IMPRESSION: Small to moderate left pneumothorax, probably grossly stable compared with CT performed earlier today. Chronic pleural and parenchymal scarring at the left base. Electronically Signed   By: Jasmine Pang M.D.   On: 05/15/2020 21:28   CT Chest Wo Contrast  Addendum Date: 05/15/2020   ADDENDUM REPORT: 05/15/2020  20:09 ADDENDUM: The original report was by Dr. Gaylyn Rong. The following addendum is by Dr. Gaylyn Rong: Critical Value/emergent results were called by telephone at the time of interpretation on 05/15/2020 at 7:59 pm to provider Dr. Tilden Fossa , who verbally acknowledged these results. Electronically Signed   By: Gaylyn Rong M.D.   On: 05/15/2020 20:09   Result Date: 05/15/2020 CLINICAL DATA:  Abdominal pain, vomiting, constipation, fever. Recent diaphragmatic hernia repair. EXAM: CT CHEST, ABDOMEN AND PELVIS WITHOUT CONTRAST TECHNIQUE: Multidetector CT imaging of the chest, abdomen and pelvis was performed following the standard protocol without IV contrast. COMPARISON:  05/11/2020 exam from [facility]. FINDINGS: CT CHEST FINDINGS Cardiovascular: Atherosclerotic calcification of the left subclavian artery and left anterior descending coronary artery. Mild ascending thoracic aortic ectasia at 3.7 cm, without overt aneurysm. Mediastinum/Nodes: Mildly dilated mid to distal esophagus with air-fluid level and adjacent postoperative findings. Mildly complex appearance of the gastroesophageal junction with anterior deviation of the esophagus along the postoperative findings at the gastroesophageal junction. There is previously a more dilated appearance of the distal esophagus which may have represented a hiatal hernia, I am less certain of a hiatal hernia on today's CT. Assessment of this region is complicated by considerable elevation of the left hemidiaphragm, with postoperative findings along the medial left hemidiaphragm for example on image 44/3. No appreciable pneumomediastinum. Lungs/Pleura: There is a new moderate left pneumothorax, about 20% of left hemithoracic volume similar postoperative findings along the left lung base. No shift of cardiac or mediastinal structures to the right. Musculoskeletal: Left seventh rib osteotomy. Fractures of the left sixth and eighth ribs adjacent to  the site of osteotomy, with some early healing response but no bridging. CT ABDOMEN PELVIS FINDINGS Hepatobiliary: Prior cholecystectomy.  Otherwise unremarkable. Pancreas: Unremarkable Spleen: Unremarkable Adrenals/Urinary Tract: Unremarkable Stomach/Bowel: Postoperative findings along the proximal stomach with complex appearance at the gastroesophageal junction possibly from fundoplication. There is oral contrast medium in the distal colon, possibly left over from prior scans. Vascular/Lymphatic: Aortoiliac atherosclerotic vascular disease. Reproductive: Suspected moderate proximal retraction of the testes along the spermatic cords. Other: No supplemental non-categorized findings. Musculoskeletal: Grade 1 degenerative anterolisthesis at L4-5. IMPRESSION: 1. New moderate left pneumothorax, about 20% of left hemithoracic volume. Cause/origin of this pneumothorax uncertain. 2. Stable fractures of the left sixth and eighth ribs adjacent to the site of the left seventh rib osteotomy, with some early healing response but no bony bridging. 3. Mildly dilated mid to distal esophagus with air-fluid level and adjacent postoperative findings at the gastroesophageal junction. The degree of distal esophageal dilatation is less striking than prior, given the complexity of the appearance of the distal esophagus and proximal stomach, it is difficult to completely exclude a small hiatal hernia although hernia is less obvious on the 05/11/2020  CT scan. 4. Elevated left hemidiaphragm with findings suggesting diaphragmatic repair. 5. Aortic atherosclerosis. Coronary atherosclerosis. 6. Suspected moderate proximal retraction of the testes along the spermatic cords. 7. Grade 1 degenerative anterolisthesis at L4-5. Aortic Atherosclerosis (ICD10-I70.0). Radiology assistant personnel have been notified to put me in telephone contact with the referring physician or the referring physician's clinical representative in order to discuss these  findings. Once this communication is established I will issue an addendum to this report for documentation purposes. Electronically Signed: By: Gaylyn Rong M.D. On: 05/15/2020 19:57     LOS: 2 days   Lanae Boast, MD [facility]  05/17/2020, 1:35 PM

## 2020-05-17 NOTE — Consult Note (Signed)
[facility] Consult  Referring Provider: [facility] Primary Care Physician:  Hodges, Francisco, MD Primary Gastroenterologist: Unassigned  Reason for Consultation:  Nausea, vomiting,weight loss  HPI: Edwin Hall is a 53 y.o. male with history of hiatal hernia repair at [facility] in [place] on 03/17/2020 was admitted on 05/15/2020 with abdominal pain, nausea, vomiting, chest pain, shortness of breath, inability to have a bowel movement or pass gas and 23 pound weight loss since surgery. The procedure was performed by left thoracotomy approach as patient states he has had multiple adhesions from prior hiatal hernia repair about 7 years ago.  Patient states that since surgery he has had extreme difficulty swallowing solids as well as liquids which has caused significant weight loss. He states he had a barium study performed at [place] and was noted to have a narrowing at GE junction with plans to perform endoscopy with dilation on 05/26/2020 but could not wait until then and decided to come to the hospital.  He was found to be slightly tachypneic and had a CAT scan of the chest abdomen and pelvis which showed new/recurrent left pneumothorax, with mildly dilated mid to distal esophagus with air-fluid level and HSM postoperative findings, mildly complex appearance of GE junction with anterior deviation of esophagus no appreciable pneumo mediastinum.  He was evaluated by Dr. Lightfoot from cardiothoracic surgery and was recommended conservative management.   Past Medical History:  Diagnosis Date  . Food allergy   . Headache   . Hiatal hernia   . Hyperlipidemia   . IBS (irritable bowel syndrome)   . Iron deficiency anemia   . Urticaria     Past Surgical History:  Procedure Laterality Date  . ABDOMINAL SURGERY  12/2015   pylorus plasty  . APPENDECTOMY  1984  . CHOLECYSTECTOMY  2008  . GASTROSTOMY TUBE PLACEMENT    . HIATAL HERNIA REPAIR  07/2015  . TONSILLECTOMY  1980  .  VENTRAL HERNIA REPAIR  2010    Prior to Admission medications   Medication Sig Start Date End Date Taking? Authorizing Provider  aspirin 81 MG EC tablet Take 81 mg by mouth daily. Swallow whole.   Yes [provider]  Cholecalciferol (VITAMIN D3 PO) Take 250 mcg by mouth daily.    Yes [provider]  Cyanocobalamin (VITAMIN B 12 PO) Take 1 capsule by mouth daily.   Yes [provider]  Ferrous Sulfate (IRON PO) Take 65 mg by mouth daily.    Yes [provider]  Ferrous Sulfate (IRON) 325 (65 Fe) MG TABS Take 325 mg by mouth daily.  05/12/20  Yes [provider]  gabapentin (NEURONTIN) 300 MG capsule Take 300 mg by mouth daily.  03/29/20  Yes [provider]  Ginkgo Biloba 40 MG TABS Take 40 mg by mouth daily.    Yes [provider]  ipratropium (ATROVENT) 0.06 % nasal spray USE TWO SPRAYS IN EACH NOSTRIL TWICE DAILY AS DIRECTED. Patient taking differently: Place 2 sprays into both nostrils daily as needed for rhinitis.  05/08/20  Yes Ambs, Anne M, FNP  mirtazapine (REMERON) 15 MG tablet Take 15 mg by mouth at bedtime. 02/08/20  Yes [provider]  Multiple Vitamin (MULTIVITAMIN WITH MINERALS) TABS tablet Take 1 tablet by mouth daily.   Yes [provider]  pantoprazole (PROTONIX) 40 MG tablet Take 40 mg by mouth 2 (two) times daily. 09/10/19  Yes [provider]  promethazine (PHENERGAN) 25 MG tablet Take 25 mg by mouth every 6 (  six) hours as needed for nausea or vomiting.  04/01/20  Yes [provider]  Zinc 50 MG TABS Take 50 mg by mouth daily.    Yes [provider]  azelastine (ASTELIN) 0.1 % nasal spray 2 sprays per nostril twice a day for control of nasal drainage. Patient not taking: Reported on 05/02/2020 11/27/19 05/02/20  Padgett, Shaylar Patricia, MD    Current Facility-Administered Medications  Medication Dose Route Frequency Provider Last Rate Last Admin  . 0.9 %  sodium  chloride infusion   Intravenous Continuous Kc, Ramesh, MD 50 mL/hr at 05/17/20 0046 New Bag at 05/17/20 0046  . aspirin EC tablet 81 mg  81 mg Oral Daily Kc, Ramesh, MD   81 mg at 05/17/20 0945  . cholecalciferol (VITAMIN D) tablet 10,000 Units  250 mcg Oral Daily Kc, Ramesh, MD   10,000 Units at 05/17/20 0946  . diphenhydrAMINE (BENADRYL) injection 12.5 mg  12.5 mg Intravenous Q6H PRN Kc, Ramesh, MD   12.5 mg at 05/17/20 0812  . gabapentin (NEURONTIN) capsule 300 mg  300 mg Oral Daily Kc, Ramesh, MD      . ipratropium (ATROVENT) 0.06 % nasal spray 2 spray  2 spray Each Nare Daily PRN Kc, Ramesh, MD      . mirtazapine (REMERON) tablet 15 mg  15 mg Oral QHS Kc, Ramesh, MD   15 mg at 05/16/20 2108  . morphine 2 MG/ML injection 2 mg  2 mg Intravenous Once Kc, Ramesh, MD      . morphine 2 MG/ML injection 2-4 mg  2-4 mg Intravenous Q4H PRN Opyd, Timothy S, MD   4 mg at 05/17/20 0812  . multivitamin with minerals tablet 1 tablet  1 tablet Oral Daily Kc, Ramesh, MD   1 tablet at 05/17/20 0944  . pantoprazole (PROTONIX) EC tablet 40 mg  40 mg Oral BID Kc, Ramesh, MD   40 mg at 05/17/20 0945  . promethazine (PHENERGAN) injection 12.5 mg  12.5 mg Intravenous Q4H PRN Kc, Ramesh, MD      . promethazine (PHENERGAN) tablet 25 mg  25 mg Oral Q6H PRN Kc, Ramesh, MD      . tamsulosin (FLOMAX) capsule 0.4 mg  0.4 mg Oral QPC breakfast Kc, Ramesh, MD   0.4 mg at 05/17/20 0945  . traMADol (ULTRAM) tablet 50 mg  50 mg Oral Q4H PRN Kc, Ramesh, MD      . vitamin B-12 (CYANOCOBALAMIN) tablet 100 mcg  100 mcg Oral Daily Kc, Ramesh, MD   100 mcg at 05/17/20 0945  . zinc sulfate capsule 220 mg  220 mg Oral Daily Kc, Ramesh, MD   220 mg at 05/17/20 0944    Allergies as of 05/15/2020 - Review Complete 05/15/2020  Allergen Reaction Noted  . Other Anaphylaxis 10/29/2015  . Shellfish allergy Anaphylaxis 10/29/2015  . Iodine Hives 05/14/2017  . Tetracyclines & related Itching 10/29/2015  . Zofran [ondansetron hcl] Itching  and Rash 10/29/2015    Family History  Problem Relation Age of Onset  . Parkinson's disease Father   . Heart disease Father   . Angioedema Mother   . Allergic rhinitis Neg Hx   . Asthma Neg Hx   . Eczema Neg Hx   . Immunodeficiency Neg Hx   . Urticaria Neg Hx     Social History   Socioeconomic History  . Marital status: Single    Spouse name: Not on file  . Number of children: Not on file  . Years of   education: Not on file  . Highest education level: Not on file  Occupational History  . Not on file  Tobacco Use  . Smoking status: Never Smoker  . Smokeless tobacco: Never Used  Vaping Use  . Vaping Use: Never used  Substance and Sexual Activity  . Alcohol use: No  . Drug use: No  . Sexual activity: Not on file  Other Topics Concern  . Not on file  Social History Narrative  . Not on file   Social Determinants of Health   Financial Resource Strain:   . Difficulty of Paying Living Expenses:   Food Insecurity:   . Worried About Running Out of Food in the Last Year:   . Ran Out of Food in the Last Year:   Transportation Needs:   . Lack of Transportation (Medical):   . Lack of Transportation (Non-Medical):   Physical Activity:   . Days of Exercise per Week:   . Minutes of Exercise per Session:   Stress:   . Feeling of Stress :   Social Connections:   . Frequency of Communication with Friends and Family:   . Frequency of Social Gatherings with Friends and Family:   . Attends Religious Services:   . Active Member of Clubs or Organizations:   . Attends Club or Organization Meetings:   . Marital Status:   Intimate Partner Violence:   . Fear of Current or Ex-Partner:   . Emotionally Abused:   . Physically Abused:   . Sexually Abused:     Review of Systems: Positive for: GI: Described in detail in HPI.    Gen: anorexia, fatigue, weakness, malaise, involuntary weight loss, Denies any fever, chills, rigors, night sweats, and sleep disorder CV: Denies chest pain,  angina, palpitations, syncope, orthopnea, PND, peripheral edema, and claudication. Resp: Denies dyspnea, cough, sputum, wheezing, coughing up blood. GU : Denies urinary burning, blood in urine, urinary frequency, urinary hesitancy, nocturnal urination, and urinary incontinence. MS: Denies joint pain or swelling.  Denies muscle weakness, cramps, atrophy.  Derm: Denies rash, itching, oral ulcerations, hives, unhealing ulcers.  Psych: Denies depression, anxiety, memory loss, suicidal ideation, hallucinations,  and confusion. Heme: Denies bruising, bleeding, and enlarged lymph nodes. Neuro:  Denies any headaches, dizziness, paresthesias. Endo:  Denies any problems with DM, thyroid, adrenal function.  Physical Exam: Vital signs in last 24 hours: Temp:  [97.5 F (36.4 C)-98.3 F (36.8 C)] 97.5 F (36.4 C) (07/03 0623) Pulse Rate:  [72-87] 72 (07/03 0623) Resp:  [14-17] 14 (07/03 0623) BP: (99-126)/(70-98) 99/70 (07/03 0623) SpO2:  [95 %-100 %] 96 % (07/03 0623) Weight:  [53.9 kg] 53.9 kg (07/02 1444) Last BM Date: 05/16/20  General:   Alert,  Well-developed, thinly built, pleasant and cooperative in NAD Head:  Normocephalic and atraumatic. Eyes:  Sclera clear, no icterus.   Conjunctiva pink. Ears:  Normal auditory acuity. Nose:  No deformity, discharge,  or lesions. Mouth:  No deformity or lesions.  Oropharynx pink & moist. Neck:  Supple; no masses or thyromegaly. Lungs: Left thoracotomy scar, clear throughout to auscultation.   No wheezes, crackles, or rhonchi. No acute distress. Heart:  Regular rate and rhythm; no murmurs, clicks, rubs,  or gallops. Extremities:  Without clubbing or edema. Neurologic:  Alert and  oriented x4;  grossly normal neurologically. Skin:  Intact without significant lesions or rashes. Psych:  Alert and cooperative. Normal mood and affect. Abdomen:  Soft and nondistended.  Midline surgical scar appears well-healed, mild generalized tenderness          Lab  Results: Recent Labs    05/15/20 1838  WBC 5.9  HGB 15.0  HCT 44.9  PLT 223   BMET Recent Labs    05/15/20 1838  NA 140  K 4.0  CL 104  CO2 24  GLUCOSE 112*  BUN 13  CREATININE 1.14  CALCIUM 9.2   LFT Recent Labs    05/15/20 1838  PROT 7.2  ALBUMIN 4.1  AST 26  ALT 19  ALKPHOS 64  BILITOT 1.0   PT/INR Recent Labs    05/16/20 0853  LABPROT 13.7  INR 1.1    Studies/Results: CT Abdomen Pelvis Wo Contrast  Addendum Date: 05/15/2020   ADDENDUM REPORT: 05/15/2020 20:09 ADDENDUM: The original report was by Dr. Walter Liebkemann. The following addendum is by Dr. Walter Liebkemann: Critical Value/emergent results were called by telephone at the time of interpretation on 05/15/2020 at 7:59 pm to provider Dr. ELIZABETH REES , who verbally acknowledged these results. Electronically Signed   By: Walter  Liebkemann M.D.   On: 05/15/2020 20:09   Result Date: 05/15/2020 CLINICAL DATA:  Abdominal pain, vomiting, constipation, fever. Recent diaphragmatic hernia repair. EXAM: CT CHEST, ABDOMEN AND PELVIS WITHOUT CONTRAST TECHNIQUE: Multidetector CT imaging of the chest, abdomen and pelvis was performed following the standard protocol without IV contrast. COMPARISON:  05/11/2020 exam from [facility]. FINDINGS: CT CHEST FINDINGS Cardiovascular: Atherosclerotic calcification of the left subclavian artery and left anterior descending coronary artery. Mild ascending thoracic aortic ectasia at 3.7 cm, without overt aneurysm. Mediastinum/Nodes: Mildly dilated mid to distal esophagus with air-fluid level and adjacent postoperative findings. Mildly complex appearance of the gastroesophageal junction with anterior deviation of the esophagus along the postoperative findings at the gastroesophageal junction. There is previously a more dilated appearance of the distal esophagus which may have represented a hiatal hernia, I am less certain of a hiatal hernia on today's CT. Assessment of this region  is complicated by considerable elevation of the left hemidiaphragm, with postoperative findings along the medial left hemidiaphragm for example on image 44/3. No appreciable pneumomediastinum. Lungs/Pleura: There is a new moderate left pneumothorax, about 20% of left hemithoracic volume similar postoperative findings along the left lung base. No shift of cardiac or mediastinal structures to the right. Musculoskeletal: Left seventh rib osteotomy. Fractures of the left sixth and eighth ribs adjacent to the site of osteotomy, with some early healing response but no bridging. CT ABDOMEN PELVIS FINDINGS Hepatobiliary: Prior cholecystectomy.  Otherwise unremarkable. Pancreas: Unremarkable Spleen: Unremarkable Adrenals/Urinary Tract: Unremarkable Stomach/Bowel: Postoperative findings along the proximal stomach with complex appearance at the gastroesophageal junction possibly from fundoplication. There is oral contrast medium in the distal colon, possibly left over from prior scans. Vascular/Lymphatic: Aortoiliac atherosclerotic vascular disease. Reproductive: Suspected moderate proximal retraction of the testes along the spermatic cords. Other: No supplemental non-categorized findings. Musculoskeletal: Grade 1 degenerative anterolisthesis at L4-5. IMPRESSION: 1. New moderate left pneumothorax, about 20% of left hemithoracic volume. Cause/origin of this pneumothorax uncertain. 2. Stable fractures of the left sixth and eighth ribs adjacent to the site of the left seventh rib osteotomy, with some early healing response but no bony bridging. 3. Mildly dilated mid to distal esophagus with air-fluid level and adjacent postoperative findings at the gastroesophageal junction. The degree of distal esophageal dilatation is less striking than prior, given the complexity of the appearance of the distal esophagus and proximal stomach, it is difficult to completely exclude a small hiatal hernia although hernia is less obvious on the  05/11/2020 CT scan. 4. Elevated   left hemidiaphragm with findings suggesting diaphragmatic repair. 5. Aortic atherosclerosis. Coronary atherosclerosis. 6. Suspected moderate proximal retraction of the testes along the spermatic cords. 7. Grade 1 degenerative anterolisthesis at L4-5. Aortic Atherosclerosis (ICD10-I70.0). Radiology assistant personnel have been notified to put me in telephone contact with the referring physician or the referring physician's clinical representative in order to discuss these findings. Once this communication is established I will issue an addendum to this report for documentation purposes. Electronically Signed: By: Walter  Liebkemann M.D. On: 05/15/2020 19:57   DG Chest 2 View  Result Date: 05/17/2020 CLINICAL DATA:  Status post esophageal surgery several days prior, pneumothorax. EXAM: CHEST - 2 VIEW COMPARISON:  May 15, 2020 FINDINGS: Small to moderate left apical pneumothorax is stable compared to prior exam. Small left pleural effusion is identified. The right lung is clear. The mediastinal contour and cardiac silhouette are stable. Bony structures are stable. There is evidence of prior left lateral lower rib healing fracture. IMPRESSION: Persistent small to moderate left apical pneumothorax unchanged. Small left pleural effusion. Electronically Signed   By: Wei-Chen  Lin M.D.   On: 05/17/2020 08:16   DG Chest 2 View  Result Date: 05/15/2020 CLINICAL DATA:  Chest pain EXAM: CHEST - 2 VIEW COMPARISON:  05/02/2020, CT 05/15/2020, radiograph 04/30/2017 FINDINGS: Right lung is clear. Chronic pleural and parenchymal scarring at the left base. Small moderate left pneumothorax, best seen at the apex, probably grossly stable in size compared with the recent CT. No midline shift. Stable cardiomediastinal silhouette. Enteral contrast or radiopaque material in the colon. Left seventh rib resection. Left sixth and eighth healing rib fractures. IMPRESSION: Small to moderate left  pneumothorax, probably grossly stable compared with CT performed earlier today. Chronic pleural and parenchymal scarring at the left base. Electronically Signed   By: Kim  Fujinaga M.D.   On: 05/15/2020 21:28   CT Chest Wo Contrast  Addendum Date: 05/15/2020   ADDENDUM REPORT: 05/15/2020 20:09 ADDENDUM: The original report was by Dr. Walter Liebkemann. The following addendum is by Dr. Walter Liebkemann: Critical Value/emergent results were called by telephone at the time of interpretation on 05/15/2020 at 7:59 pm to provider Dr. ELIZABETH REES , who verbally acknowledged these results. Electronically Signed   By: Walter  Liebkemann M.D.   On: 05/15/2020 20:09   Result Date: 05/15/2020 CLINICAL DATA:  Abdominal pain, vomiting, constipation, fever. Recent diaphragmatic hernia repair. EXAM: CT CHEST, ABDOMEN AND PELVIS WITHOUT CONTRAST TECHNIQUE: Multidetector CT imaging of the chest, abdomen and pelvis was performed following the standard protocol without IV contrast. COMPARISON:  05/11/2020 exam from [facility]. FINDINGS: CT CHEST FINDINGS Cardiovascular: Atherosclerotic calcification of the left subclavian artery and left anterior descending coronary artery. Mild ascending thoracic aortic ectasia at 3.7 cm, without overt aneurysm. Mediastinum/Nodes: Mildly dilated mid to distal esophagus with air-fluid level and adjacent postoperative findings. Mildly complex appearance of the gastroesophageal junction with anterior deviation of the esophagus along the postoperative findings at the gastroesophageal junction. There is previously a more dilated appearance of the distal esophagus which may have represented a hiatal hernia, I am less certain of a hiatal hernia on today's CT. Assessment of this region is complicated by considerable elevation of the left hemidiaphragm, with postoperative findings along the medial left hemidiaphragm for example on image 44/3. No appreciable pneumomediastinum. Lungs/Pleura: There  is a new moderate left pneumothorax, about 20% of left hemithoracic volume similar postoperative findings along the left lung base. No shift of cardiac or mediastinal structures to the right. Musculoskeletal: Left   seventh rib osteotomy. Fractures of the left sixth and eighth ribs adjacent to the site of osteotomy, with some early healing response but no bridging. CT ABDOMEN PELVIS FINDINGS Hepatobiliary: Prior cholecystectomy.  Otherwise unremarkable. Pancreas: Unremarkable Spleen: Unremarkable Adrenals/Urinary Tract: Unremarkable Stomach/Bowel: Postoperative findings along the proximal stomach with complex appearance at the gastroesophageal junction possibly from fundoplication. There is oral contrast medium in the distal colon, possibly left over from prior scans. Vascular/Lymphatic: Aortoiliac atherosclerotic vascular disease. Reproductive: Suspected moderate proximal retraction of the testes along the spermatic cords. Other: No supplemental non-categorized findings. Musculoskeletal: Grade 1 degenerative anterolisthesis at L4-5. IMPRESSION: 1. New moderate left pneumothorax, about 20% of left hemithoracic volume. Cause/origin of this pneumothorax uncertain. 2. Stable fractures of the left sixth and eighth ribs adjacent to the site of the left seventh rib osteotomy, with some early healing response but no bony bridging. 3. Mildly dilated mid to distal esophagus with air-fluid level and adjacent postoperative findings at the gastroesophageal junction. The degree of distal esophageal dilatation is less striking than prior, given the complexity of the appearance of the distal esophagus and proximal stomach, it is difficult to completely exclude a small hiatal hernia although hernia is less obvious on the 05/11/2020 CT scan. 4. Elevated left hemidiaphragm with findings suggesting diaphragmatic repair. 5. Aortic atherosclerosis. Coronary atherosclerosis. 6. Suspected moderate proximal retraction of the testes along the  spermatic cords. 7. Grade 1 degenerative anterolisthesis at L4-5. Aortic Atherosclerosis (ICD10-I70.0). Radiology assistant personnel have been notified to put me in telephone contact with the referring physician or the referring physician's clinical representative in order to discuss these findings. Once this communication is established I will issue an addendum to this report for documentation purposes. Electronically Signed: By: Walter  Liebkemann M.D. On: 05/15/2020 19:57    Impression: Hiatal hernia repaired via left thoracic approach on 03/17/2020 Pneumothorax noted on CAT scan Persistent nausea, vomiting, difficulty swallowing solids and liquids Unintentional weight loss  Plan: Esophagogram with thin barium or Gastrografin to be performed today. If obvious stricture or narrowing noted plan endoscopy with dilation in a.m. The risks and the benefits of the procedure were discussed with the patient in details. He understands and verbalizes consent.   LOS: 2 days   Glora Hulgan, MD  05/17/2020, 11:37 AM  

## 2020-05-18 ENCOUNTER — Encounter (HOSPITAL_COMMUNITY): Payer: Self-pay | Admitting: Family Medicine

## 2020-05-18 ENCOUNTER — Inpatient Hospital Stay (HOSPITAL_COMMUNITY): Payer: Medicare Other | Admitting: Anesthesiology

## 2020-05-18 ENCOUNTER — Encounter (HOSPITAL_COMMUNITY)
Admission: EM | Disposition: A | Discharge status: Home / Self Care | Payer: Self-pay | Source: Home / Self Care | Attending: Internal Medicine

## 2020-05-18 ENCOUNTER — Inpatient Hospital Stay (HOSPITAL_COMMUNITY): Payer: Medicare Other

## 2020-05-18 HISTORY — PX: ESOPHAGOGASTRODUODENOSCOPY (EGD) WITH PROPOFOL: SHX5813

## 2020-05-18 HISTORY — PX: BIOPSY: SHX5522

## 2020-05-18 LAB — COMPREHENSIVE METABOLIC PANEL
ALT: 158 U/L — ABNORMAL HIGH (ref 0–44)
AST: 64 U/L — ABNORMAL HIGH (ref 15–41)
Albumin: 3.7 g/dL (ref 3.5–5.0)
Alkaline Phosphatase: 116 U/L (ref 38–126)
Anion gap: 9 (ref 5–15)
BUN: 8 mg/dL (ref 6–20)
CO2: 27 mmol/L (ref 22–32)
Calcium: 8.5 mg/dL — ABNORMAL LOW (ref 8.9–10.3)
Chloride: 102 mmol/L (ref 98–111)
Creatinine, Ser: 0.69 mg/dL (ref 0.61–1.24)
GFR calc Af Amer: >60 mL/min (ref >60)
GFR calc non Af Amer: >60 mL/min (ref >60)
Glucose, Bld: 103 mg/dL — ABNORMAL HIGH (ref 70–99)
Potassium: 3.8 mmol/L (ref 3.5–5.1)
Sodium: 138 mmol/L (ref 135–145)
Total Bilirubin: 0.9 mg/dL (ref 0.3–1.2)
Total Protein: 6.1 g/dL — ABNORMAL LOW (ref 6.5–8.1)

## 2020-05-18 LAB — CBC
HCT: 39.1 % (ref 39.0–52.0)
Hemoglobin: 13.1 g/dL (ref 13.0–17.0)
MCH: 32 pg (ref 26.0–34.0)
MCHC: 33.5 g/dL (ref 30.0–36.0)
MCV: 95.6 fL (ref 80.0–100.0)
Platelets: 178 10*3/uL (ref 150–400)
RBC: 4.09 MIL/uL — ABNORMAL LOW (ref 4.22–5.81)
RDW: 12 % (ref 11.5–15.5)
WBC: 4.1 10*3/uL (ref 4.0–10.5)
nRBC: 0 % (ref 0.0–0.2)

## 2020-05-18 LAB — TROPONIN I (HIGH SENSITIVITY)
Troponin I (High Sensitivity): 3 ng/L (ref <18)
Troponin I (High Sensitivity): 2 ng/L (ref <18)

## 2020-05-18 LAB — D-DIMER, QUANTITATIVE: D-Dimer, Quant: 0.43 ug/mL-FEU (ref 0.00–0.50)

## 2020-05-18 SURGERY — ESOPHAGOGASTRODUODENOSCOPY (EGD) WITH PROPOFOL
Anesthesia: Monitor Anesthesia Care

## 2020-05-18 MED ORDER — SUCRALFATE 1 GM/10ML PO SUSP
1.0000 g | Freq: Two times a day (BID) | ORAL | Status: DC
  Filled 2020-05-18: qty 10

## 2020-05-18 MED ORDER — SUCRALFATE 1 GM/10ML PO SUSP
1.0000 g | Freq: Two times a day (BID) | ORAL | Status: DC
  Administered 2020-05-18 – 2020-05-20 (×4): 1 g via ORAL
  Filled 2020-05-18 (×3): qty 10

## 2020-05-18 MED ORDER — PROPOFOL 500 MG/50ML IV EMUL
INTRAVENOUS | Status: DC | PRN
  Administered 2020-05-18: 150 ug/kg/min via INTRAVENOUS
  Administered 2020-05-18: 60 mg via INTRAVENOUS

## 2020-05-18 MED ORDER — ALUM & MAG HYDROXIDE-SIMETH 200-200-20 MG/5ML PO SUSP
30.0000 mL | Freq: Once | ORAL | Status: AC
  Administered 2020-05-18: 30 mL via ORAL
  Filled 2020-05-18: qty 30

## 2020-05-18 MED ORDER — PANTOPRAZOLE SODIUM 40 MG IV SOLR
40.0000 mg | Freq: Two times a day (BID) | INTRAVENOUS | Status: DC
  Administered 2020-05-18 – 2020-05-19 (×3): 40 mg via INTRAVENOUS
  Filled 2020-05-18 (×2): qty 40

## 2020-05-18 MED ORDER — LACTATED RINGERS IV SOLN: INTRAVENOUS | Status: DC

## 2020-05-18 MED ORDER — LIDOCAINE VISCOUS HCL 2 % MT SOLN
15.0000 mL | Freq: Once | OROMUCOSAL | Status: AC
  Administered 2020-05-18: 15 mL via ORAL
  Filled 2020-05-18: qty 15

## 2020-05-18 MED ORDER — ALPRAZOLAM 0.25 MG PO TABS
0.2500 mg | ORAL_TABLET | Freq: Three times a day (TID) | ORAL | Status: DC | PRN
  Administered 2020-05-19: 0.25 mg via ORAL
  Filled 2020-05-18: qty 1

## 2020-05-18 MED ORDER — MORPHINE SULFATE (PF) 2 MG/ML IV SOLN
2.0000 mg | INTRAVENOUS | Status: DC | PRN
  Administered 2020-05-18: 4 mg via INTRAVENOUS
  Administered 2020-05-19: 2 mg via INTRAVENOUS
  Administered 2020-05-19 (×3): 4 mg via INTRAVENOUS
  Filled 2020-05-18 (×5): qty 2

## 2020-05-18 MED ORDER — LACTATED RINGERS IV SOLN: INTRAVENOUS | Status: DC | PRN

## 2020-05-18 SURGICAL SUPPLY — 15 items

## 2020-05-18 NOTE — Interval H&P Note (Signed)
History and Physical Interval Note: 53/male with dysphagia, nausea and vomiting, history of hiatal hernia repair, possible stricture noted on outside imaging.  05/18/2020 11:43 AM  Edwin Hall  has presented today for EGD with possible balloon dilation, with the diagnosis of Esophagoduodenoscopy with dilation with propofol.  The various methods of treatment have been discussed with the patient and family. After consideration of risks, benefits and other options for treatment, the patient has consented to  Procedure(s): ESOPHAGOGASTRODUODENOSCOPY (EGD) WITH PROPOFOL (N/A) as a surgical intervention.  The patient's history has been reviewed, patient examined, no change in status, stable for surgery.  I have reviewed the patient's chart and labs.  Questions were answered to the patient's satisfaction.     Kerin Salen

## 2020-05-18 NOTE — Op Note (Signed)
[facility] Patient Name: Edwin Hall Procedure Date: 05/18/2020 MRN: [id number] Attending MD: Kerin Salen , MD Date of Birth: 10-16-1966 CSN: [id number] Age: 54 Admit Type: Inpatient Procedure:                Upper GI endoscopy Indications:              Dysphagia, Nausea with vomiting, Weight loss Providers:                Kerin Salen, MD, Adolph Pollack, RN, Kandice Robinsons, Technician Referring MD:             [facility] Medicines:                Monitored Anesthesia Care Complications:            No immediate complications. Estimated blood loss:                            Minimal. Estimated Blood Loss:     Estimated blood loss was minimal. Procedure:                Pre-Anesthesia Assessment:                           - Prior to the procedure, a History and Physical                            was performed, and patient medications and                            allergies were reviewed. The patient's tolerance of                            previous anesthesia was also reviewed. The risks                            and benefits of the procedure and the sedation                            options and risks were discussed with the patient.                            All questions were answered, and informed consent                            was obtained. Prior Anticoagulants: The patient has                            taken no previous anticoagulant or antiplatelet                            agents except for aspirin. ASA Grade Assessment: II                            -  A patient with mild systemic disease. After                            reviewing the risks and benefits, the patient was                            deemed in satisfactory condition to undergo the                            procedure.                           After obtaining informed consent, the endoscope was                            passed under direct vision.  Throughout the                            procedure, the patient's blood pressure, pulse, and                            oxygen saturations were monitored continuously. The                            GIF-H190 ([id number] was introduced through the                            mouth, and advanced to the second part of duodenum.                            The upper GI endoscopy was accomplished without                            difficulty. The patient tolerated the procedure                            well. Scope In: Scope Out: Findings:      There were esophageal mucosal changes consistent with long-segment       Barrett's esophagus present in the middle third of the esophagus and in       the lower third of the esophagus. The maximum longitudinal extent of       these mucosal changes was 11 cm in length. Mucosa was biopsied with a       cold forceps for histology in a targeted manner from 29 to 40 cm from       the incisors. One specimen bottle was sent to pathology.      There was no obvious narrowing or stricture noted.      Evidence of a fundoplication was found in the gastric fundus. The wrap       appeared intact. This was traversed. There was scarring noted in the       fundus from prior surgery.      One non-bleeding superficial gastric ulcer with a clean ulcer base       (Forrest Class III) was found in the proximal gastric body near the  cardia. The lesion was 10 mm in largest dimension. Biopsies were taken       with a cold forceps for histology.      Localized mildly erythematous mucosa without bleeding was found in the       gastric antrum. Biopsies were taken with a cold forceps for Helicobacter       pylori testing.      The examined duodenum was normal. Impression:               - Esophageal mucosal changes consistent with                            long-segment Barrett's esophagus. Biopsied.                           - A fundoplication was found. The wrap appears                             intact.                           - Non-bleeding gastric ulcer with a clean ulcer                            base (Forrest Class III). Biopsied.                           - Erythematous mucosa in the antrum. Biopsied.                           - Normal examined duodenum. Moderate Sedation:      Patient did not receive moderate sedation for this procedure, but       instead received monitored anesthesia care. Recommendation:           - Resume regular diet.                           - Continue present medications.                           - Await pathology results.                           - Use sucralfate suspension 1 gram PO BID for 2                            weeks. Procedure Code(s):        --- Professional ---                           [phone], Esophagogastroduodenoscopy, flexible,                            transoral; with biopsy, single or multiple Diagnosis Code(s):        --- Professional ---                           K22.8, Other specified  diseases of esophagus                           Z98.890, Other specified postprocedural states                           K25.9, Gastric ulcer, unspecified as acute or                            chronic, without hemorrhage or perforation                           K31.89, Other diseases of stomach and duodenum                           R13.10, Dysphagia, unspecified                           R11.2, Nausea with vomiting, unspecified                           R63.4, Abnormal weight loss CPT copyright 2019 [employer]. All rights reserved. The codes documented in this report are preliminary and upon coder review may  be revised to meet current compliance requirements. Kerin Salen, MD 05/18/2020 12:10:42 PM This report has been signed electronically. Number of Addenda: 0

## 2020-05-18 NOTE — Brief Op Note (Signed)
05/15/2020 - 05/18/2020  12:03 PM  PATIENT:  Edwin Hall  54 y.o. male  PRE-OPERATIVE DIAGNOSIS:  Esophagoduodenoscopy with dilation with propofol  POST-OPERATIVE DIAGNOSIS:  gastric biopsy rule out hpylori, biopsy gastric ulcer, esophageal biopsy rule out barretts, no stricture   PROCEDURE:  Procedure(s): ESOPHAGOGASTRODUODENOSCOPY (EGD) WITH PROPOFOL (N/A) BIOPSY  SURGEON:  Surgeon(s) and Role:    Ronnette Juniper, MD - Primary  PHYSICIAN ASSISTANT:   ASSISTANTS:jenny Kappus, RN, Doristine Johns RN, Eivin Dalton, Tech  ANESTHESIA:   MAC  EBL:  Minimal  BLOOD ADMINISTERED:none  DRAINS: none   LOCAL MEDICATIONS USED:  NONE  SPECIMEN:  Biopsy / Limited Resection  DISPOSITION OF SPECIMEN:  PATHOLOGY  COUNTS:  YES  TOURNIQUET:  * No tourniquets in log *  DICTATION: .Dragon Dictation  PLAN OF CARE: Admit to inpatient   PATIENT DISPOSITION:  PACU - hemodynamically stable.   Delay start of Pharmacological VTE agent (>24hrs) due to surgical blood loss or risk of bleeding: not applicable

## 2020-05-18 NOTE — Anesthesia Preprocedure Evaluation (Addendum)
Anesthesia Evaluation  Patient identified by MRN, date of birth, ID band Patient awake    Reviewed: Allergy & Precautions, NPO status , Patient's Chart, lab work & pertinent test results  Airway Mallampati: II  TM Distance: >3 FB Neck ROM: Full    Dental no notable dental hx. (+) Teeth Intact, Dental Advisory Given   Pulmonary neg pulmonary ROS,    Pulmonary exam normal breath sounds clear to auscultation       Cardiovascular Normal cardiovascular exam Rhythm:Regular Rate:Normal  HLD   Neuro/Psych negative neurological ROS  negative psych ROS   GI/Hepatic Neg liver ROS, hiatal hernia (s/p repair 04/2020), GERD  ,  Endo/Other  negative endocrine ROS  Renal/GU negative Renal ROS  negative genitourinary   Musculoskeletal negative musculoskeletal ROS (+)   Abdominal   Peds  Hematology negative hematology ROS (+)   Anesthesia Other Findings Known stricture at GE junction. Presented on 05/15/20 with N/V and abdominal pain.  Found to have 20% left pneumothorax. Stable on RA.  Reproductive/Obstetrics                            Anesthesia Physical Anesthesia Plan  ASA: II  Anesthesia Plan: MAC   Post-op Pain Management:    Induction: Intravenous  PONV Risk Score and Plan: 1 and Propofol infusion and Treatment may vary due to age or medical condition  Airway Management Planned: Natural Airway  Additional Equipment:   Intra-op Plan:   Post-operative Plan:   Informed Consent: I have reviewed the patients History and Physical, chart, labs and discussed the procedure including the risks, benefits and alternatives for the proposed anesthesia with the patient or authorized representative who has indicated his/her understanding and acceptance.     Dental advisory given  Plan Discussed with: CRNA  Anesthesia Plan Comments:         Anesthesia Quick Evaluation

## 2020-05-18 NOTE — Progress Notes (Signed)
Patient rec's schedule Edwin Hall supplement. Patient was unable to tolerate the taste of supplement. Patient request for alternative supplement. Will make oncoming  Day nurse aware on 05/18/2020.

## 2020-05-18 NOTE — Transfer of Care (Signed)
Immediate Anesthesia Transfer of Care Note  Patient: Edwin Hall  Procedure(s) Performed: Procedure(s): ESOPHAGOGASTRODUODENOSCOPY (EGD) WITH PROPOFOL (N/A) BIOPSY  Patient Location: PACU and Endoscopy Unit  Anesthesia Type:MAC  Level of Consciousness: awake, alert  and oriented  Airway & Oxygen Therapy: Patient Spontanous Breathing and Patient connected to nasal cannula oxygen  Post-op Assessment: Report given to RN and Post -op Vital signs reviewed and stable  Post vital signs: Reviewed and stable  Last Vitals:  Vitals:   05/18/20 0628 05/18/20 1114  BP: 113/84 (!) 127/98  Pulse: 69 68  Resp: 18 12  Temp: 36.5 C 36.7 C  SpO2: 82% 641%    Complications: No apparent anesthesia complications

## 2020-05-18 NOTE — Anesthesia Postprocedure Evaluation (Signed)
Anesthesia Post Note  Patient: Edwin Hall  Procedure(s) Performed: ESOPHAGOGASTRODUODENOSCOPY (EGD) WITH PROPOFOL (N/A ) BIOPSY     Patient location during evaluation: Endoscopy Anesthesia Type: MAC Level of consciousness: awake and alert Pain management: pain level controlled Vital Signs Assessment: post-procedure vital signs reviewed and stable Respiratory status: spontaneous breathing, nonlabored ventilation, respiratory function stable and patient connected to nasal cannula oxygen Cardiovascular status: blood pressure returned to baseline and stable Postop Assessment: no apparent nausea or vomiting Anesthetic complications: no   No complications documented.  Last Vitals:  Vitals:   05/18/20 1220 05/18/20 1232  BP: 114/82 (!) 118/94  Pulse: 74 74  Resp: 19 16  Temp:  36.7 C  SpO2: 100% 100%    Last Pain:  Vitals:   05/18/20 1232  TempSrc: Oral  PainSc:                  [name] L Raylinn Kosar

## 2020-05-18 NOTE — Progress Notes (Signed)
Bladder scan patient after voiding in urinal . Scan reflected in bladder. Patient does not have the urge to urinate at this time.  Will cont to monitor.

## 2020-05-18 NOTE — Progress Notes (Addendum)
PROGRESS NOTE    Edwin Hall  FFM:[id number] DOB: 03-09-1966 DOA: 05/15/2020 PCP: Charlott Rakes, MD   Chef Complaints: chest pain  Brief Narrative: 54 y.o. male with medical history significant for irritable bowel syndrome, hyperlipidemia, and hiatal hernia status post surgical repair at [facility] in [place] on 03/17/2020, now presenting to emergency department with abdominal pain, nausea, vomiting, chest pain, and shortness of breath.  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  He continues to have the symptoms and then developed chest pain and shortness of breath a couple weeks ago for which she was seen at [facility] last month and found to have a small left-sided pneumothorax that resolved without treatment.  His abdominal pain, nausea, and vomiting began to worsen over the past few days and he has developed recurrent pain in the left chest, shortness of breath, and mild nonproductive cough.  Abdominal pain is mainly in the left lower and mid abdomen without alleviating or exacerbating factors.  He denies any diarrhea, melena, or hematochezia.  He has been taking Phenergan at home with some relief.  He reports history of childhood asthma but has not used an inhaler in many years and denies any history of smoking or COPD.  ED Course: Upon arrival to the ED, patient is found to be afebrile, saturating well on room air, slightly tachypneic, and with stable blood pressure.  Chemistry panel and CBC are unremarkable, troponin undetectable, and urinalysis with 20 ketones.  CT of the chest/abdomen/pelvis is concerning for new/recurrent left pneumothorax, approximately 20%, stable left sixth and eighth rib fractures adjacent to seventh rib osteotomy, and mildly  dilated mid to distal esophagus with air-fluid level that appears decreased from CT several days ago.  Patient was given a liter of saline, multiple doses of morphine and Phenergan, and cardiothoracic surgery was consulted by the ED physician.  Surgeon recommended a medical admission  Subjective: Seen this morning.  Alert awake.  Complains of nausea.  Last night had difficulty catching of the breath and weak heart team with deep breath mostly on the left side suspecting at the site of the rib fracture/postop. Remains on room air. Assessment & Plan:  Pneumothorax recurrent, in the setting of left thoracotomy for hiatal hernia repair at [facility] last month, small to moderate-sized pneumothorax present-no evidence of tension, I discussed with Dr. Cliffton Asters who has reviewed the imaging and did not advise chest tube insertion at this time and has advised outpatient follow-up with patient's primary surgical team at [facility].  Not in acute distress some chest pain intermittently with deep breath does have have 6/7 rib # post op.  Remains stable on room air.  Nausea & vomiting abdominal pain.Unclear etiology has IBS.Recent diaphragmatic  hernia surgery in May at [facility].  Suspect multifactorial.  CT abdomen on admission"mildly dilated mid to distal esophagus with air-fluid level and adjacent postoperative findings at the gastroesophageal junction.The degree of distal esophageal dilatation is less striking than prior, given the complexity of the appearance of the distal esophagus and proximal stomach, it is difficult to completely exclude a small hiatal hernia although hernia is less obvious on the 05/11/2020 CT scan. 4. Elevated left hemidiaphragm with findings suggesting diaphragmatic repair."  Due to persistent symptoms GI consulted and possible endoscopy evaluation.  Esophagogram no acute finding.  Patient was supposed to get outpatient EGD on July 12.  Continue PPI, pain medication IV fluids supportive care and diet as  tolerated.  Dehydration with ketones in the urine continue IV hydration.This is likely in the setting of #2.  Addendum at 1600 Pt c/o chest pain sternal- epigastrium- came-seen/examined- on audcultationmoving air, tender epigastrium on exam, nauseous,not in distress, Vitals stable saturating well on RA. EKG done NSR. Discussed w/ GI. Get CT chest w/o, labs.?cause- from recent sx/pneumonia/gi related, less likely ACS/VTE- checking trops, d dimer. Hydrate w/ IVF. Add maalox w/ lidocaine, encouraged to use carafate-has not taken po meds yet.  DVT prophylaxis:SCD Code Status: full Family Communication: plan of care discussed with patient at bedside.  Status is: Inpatient  Remains inpatient appropriate because:IV treatments appropriate due to intensity of illness or inability to take PO, Inpatient level of care appropriate due to severity of illness and For monitoring of the pneumothorax, for diet tolerance  Dispo: The patient is from: Home              Anticipated d/c is to: Home              Anticipated d/c date is:1- 2 days              Patient currently is not medically stable to d/c.  Plan to discharge home once symptoms resolve and tolerating diet. Nutrition: Diet Order            Diet NPO time specified  Diet effective midnight                Consultants:see note  Procedures:see note Microbiology:see note  Medications: Scheduled Meds: . aspirin EC  81 mg Oral Daily  . Chlorhexidine Gluconate Cloth  6 each Topical Daily  . cholecalciferol  250 mcg Oral Daily  . diatrizoate meglumine-sodium  120 mL Oral Once  . feeding supplement (KATE FARMS STANDARD 1.4)  325 mL Oral BID BM  . feeding supplement (PRO-STAT SUGAR FREE 64)  30 mL Oral Daily  . gabapentin  300 mg Oral Daily  . mirtazapine  15 mg Oral QHS  . multivitamin with minerals  1 tablet Oral Daily  . pantoprazole  40 mg Oral BID  . tamsulosin  0.4 mg Oral QPC breakfast  . vitamin B-12  100 mcg Oral Daily  . zinc  sulfate  220 mg Oral Daily   Continuous Infusions: . sodium chloride 50 mL/hr at 05/18/20 0203    Antimicrobials: Anti-infectives (From admission, onward)   None       Objective: Vitals: Today's Vitals   05/18/20 0628 05/18/20 0630 05/18/20 0708 05/18/20 0720  BP: 113/84     Pulse: 69     Resp: 18     Temp: 97.7 F (36.5 C)     TempSrc: Oral     SpO2: 99%     Weight:      Height:      PainSc:  10-Worst pain ever 6  8     Intake/Output Summary (Last 24 hours) at 05/18/2020 1043 Last data filed at 05/18/2020 1000 Gross per 24 hour  Intake 1492.37 ml  Output 1450 ml  Net 42.37 ml   Filed Weights   05/15/20 1808 05/16/20 1444  Weight: 53.1 kg 53.9 kg   Weight change:    Intake/Output from previous day: 07/03 0701 - 07/04 0700 In: 1492.4 [I.V.:1492.4] Out: 1100 [Urine:1100] Intake/Output this shift: Total I/O In: -  Out: 525 [Urine:525]  Examination:  General  exam: AAOx3 , pleasant, NAD, weak appearing. HEENT:Oral mucosa moist, Ear/Nose WNL grossly, dentition normal. Respiratory system: bilaterally clear,no wheezing or crackles,no use of accessory muscle Cardiovascular system: S1 & S2 +, No JVD,. Gastrointestinal system: Abdomen soft, tender in epigastric area left chest 6/7/8 rib,ND, BS+ Nervous System:Alert, awake, moving extremities and grossly nonfocal Extremities: No edema, distal peripheral pulses palpable.  Skin: No rashes,no icterus. MSK: Normal muscle bulk,tone, power   Data Reviewed: I have personally reviewed following labs and imaging studies CBC: Recent Labs  Lab 05/15/20 1838  WBC 5.9  HGB 15.0  HCT 44.9  MCV 94.9  PLT 223   Basic Metabolic Panel: Recent Labs  Lab 05/15/20 1838  NA 140  K 4.0  CL 104  CO2 24  GLUCOSE 112*  BUN 13  CREATININE 1.14  CALCIUM 9.2   GFR: Estimated Creatinine Clearance: 57.8 mL/min (by C-G formula based on SCr of 1.14 mg/dL). Liver Function Tests: Recent Labs  Lab 05/15/20 1838  AST 26  ALT  19  ALKPHOS 64  BILITOT 1.0  PROT 7.2  ALBUMIN 4.1   Recent Labs  Lab 05/15/20 1838  LIPASE 40   No results for input(s): AMMONIA in the last 168 hours. Coagulation Profile: Recent Labs  Lab 05/16/20 0853  INR 1.1   Cardiac Enzymes: No results for input(s): CKTOTAL, CKMB, CKMBINDEX, TROPONINI in the last 168 hours. BNP (last 3 results) No results for input(s): PROBNP in the last 8760 hours. HbA1C: No results for input(s): HGBA1C in the last 72 hours. CBG: No results for input(s): GLUCAP in the last 168 hours. Lipid Profile: No results for input(s): CHOL, HDL, LDLCALC, TRIG, CHOLHDL, LDLDIRECT in the last 72 hours. Thyroid Function Tests: No results for input(s): TSH, T4TOTAL, FREET4, T3FREE, THYROIDAB in the last 72 hours. Anemia Panel: No results for input(s): VITAMINB12, FOLATE, FERRITIN, TIBC, IRON, RETICCTPCT in the last 72 hours. Sepsis Labs: No results for input(s): PROCALCITON, LATICACIDVEN in the last 168 hours.  Recent Results (from the past 240 hour(s))  SARS Coronavirus 2 by RT PCR (hospital order, performed in [facility] hospital lab) Nasopharyngeal Nasopharyngeal Swab     Status: None   Collection Time: 05/15/20 11:34 PM   Specimen: Nasopharyngeal Swab  Result Value Ref Range Status   SARS Coronavirus 2 NEGATIVE NEGATIVE Final    Comment: (NOTE) SARS-CoV-2 target nucleic acids are NOT DETECTED.  The SARS-CoV-2 RNA is generally detectable in upper and lower respiratory specimens during the acute phase of infection. The lowest concentration of SARS-CoV-2 viral copies this assay can detect is 250 copies / mL. A negative result does not preclude SARS-CoV-2 infection and should not be used as the sole basis for treatment or other patient management decisions.  A negative result may occur with improper specimen collection / handling, submission of specimen other than nasopharyngeal swab, presence of viral mutation(s) within the areas targeted by this assay,  and inadequate number of viral copies (<250 copies / mL). A negative result must be combined with clinical observations, patient history, and epidemiological information.  Fact Sheet for Patients:   BoilerBrush.com.cy  Fact Sheet for Healthcare Providers: https://pope.com/  This test is not yet approved or  cleared by the [place] FDA and has been authorized for detection and/or diagnosis of SARS-CoV-2 by FDA under an Emergency Use Authorization (EUA).  This EUA will remain in effect (meaning this test can be used) for the duration of the COVID-19 declaration under Section 564(b)(1) of the Act, 21 U.S.C. section 360bbb-3(b)(1),  unless the authorization is terminated or revoked sooner.  Performed at [facility], [address] [place], [place] [address]       Radiology Studies: DG Chest 2 View  Result Date: 05/17/2020 CLINICAL DATA:  Status post esophageal surgery several days prior, pneumothorax. EXAM: CHEST - 2 VIEW COMPARISON:  May 15, 2020 FINDINGS: Small to moderate left apical pneumothorax is stable compared to prior exam. Small left pleural effusion is identified. The right lung is clear. The mediastinal contour and cardiac silhouette are stable. Bony structures are stable. There is evidence of prior left lateral lower rib healing fracture. IMPRESSION: Persistent small to moderate left apical pneumothorax unchanged. Small left pleural effusion. Electronically Signed   By: Sherian ReinWei-Chen  Lin M.D.   On: 05/17/2020 08:16   DG ESOPHAGUS W SINGLE CM (SOL OR THIN BA)  Result Date: 05/17/2020 CLINICAL DATA:  54 year old male with dysphagia and prior hiatal hernia repair. EXAM: WATER SOLUBLE UPPER GI SERIES TECHNIQUE: Single-column upper GI series was performed using water soluble contrast. CONTRAST:  Water-soluble contrast, 60 cc COMPARISON:  Upper GI 05/05/2020 FLUOROSCOPY TIME:  Fluoroscopy Time:  1 minutes 30 seconds  FINDINGS: Scout images acquired of the chest. Unchanged appearance of the left-sided hydropneumothorax, without significant enlargement. Left-sided rib fractures again noted. Water-soluble limited/modified esophagram was then performed. Multiple obliquities performed with observation of the contrast bolus through the esophagus. Magnification spot images in multiple obliquities performed at the distal esophagus/GE junction, in the region of prior surgery. Motility of the upper and midesophagus unremarkable, with expected smooth featureless mucosa in the upper and mid esophagus. Surgical changes at the esophageal hiatus, with a similar patulous configuration of the distal esophagus just above the GE junction. Majority of the contrast moved quickly through the GE junction, however, GE junction was never visualized to significantly distend, at the level of the fundoplication. Similar appearance of persisting/recurrent hiatal hernia at the surgical site. No tablet was administered given findings on prior. No extraluminal contrast was identified. IMPRESSION: Scout images again demonstrate small left-sided hydropneumothorax, unchanged since the prior chest x-ray. Water-soluble/limited esophagram demonstrates no evidence of leak. Motility of the upper and mid esophagus within normal limits, with similar relatively slow clearance of the contrast bolus through the GE junction at the surgical site of reported fundoplication. Electronically Signed   By: Gilmer MorJaime  Wagner D.O.   On: 05/17/2020 14:11     LOS: 3 days   Lanae Boastamesh Lacara Dunsworth, MD [facility]  05/18/2020, 10:43 AM

## 2020-05-18 NOTE — Progress Notes (Signed)
Pt with complaints of mid chest pressure, dizziness, and severe nausea. Vitals and EKG obtained, both WNL. MD made aware and new orders placed. Pt has also refused all PO medications thus far in the day due to nausea. Will continue to monitor patient.

## 2020-05-19 ENCOUNTER — Inpatient Hospital Stay (HOSPITAL_COMMUNITY): Payer: Medicare Other

## 2020-05-19 DIAGNOSIS — R079 Chest pain, unspecified: Secondary | ICD-10-CM

## 2020-05-19 LAB — COMPREHENSIVE METABOLIC PANEL
ALT: 121 U/L — ABNORMAL HIGH (ref 0–44)
AST: 42 U/L — ABNORMAL HIGH (ref 15–41)
Albumin: 3.4 g/dL — ABNORMAL LOW (ref 3.5–5.0)
Alkaline Phosphatase: 102 U/L (ref 38–126)
Anion gap: 9 (ref 5–15)
BUN: 7 mg/dL (ref 6–20)
CO2: 27 mmol/L (ref 22–32)
Calcium: 8.9 mg/dL (ref 8.9–10.3)
Chloride: 102 mmol/L (ref 98–111)
Creatinine, Ser: 0.82 mg/dL (ref 0.61–1.24)
GFR calc Af Amer: >60 mL/min (ref >60)
GFR calc non Af Amer: >60 mL/min (ref >60)
Glucose, Bld: 99 mg/dL (ref 70–99)
Potassium: 4.4 mmol/L (ref 3.5–5.1)
Sodium: 138 mmol/L (ref 135–145)
Total Bilirubin: 0.7 mg/dL (ref 0.3–1.2)
Total Protein: 5.8 g/dL — ABNORMAL LOW (ref 6.5–8.1)

## 2020-05-19 LAB — ECHOCARDIOGRAM COMPLETE
Height: 68 in
Weight: 1900.8 oz

## 2020-05-19 LAB — TROPONIN I (HIGH SENSITIVITY): Troponin I (High Sensitivity): 2 ng/L (ref <18)

## 2020-05-19 MED ORDER — PANTOPRAZOLE SODIUM 40 MG PO TBEC: 40.0000 mg | DELAYED_RELEASE_TABLET | ORAL | Status: DC

## 2020-05-19 MED ORDER — ALUM & MAG HYDROXIDE-SIMETH 200-200-20 MG/5ML PO SUSP
30.0000 mL | Freq: Once | ORAL | Status: AC
  Administered 2020-05-19: 30 mL via ORAL
  Filled 2020-05-19: qty 30

## 2020-05-19 MED ORDER — PROMETHAZINE HCL 25 MG/ML IJ SOLN
12.5000 mg | INTRAMUSCULAR | Status: DC | PRN
  Administered 2020-05-20: 12.5 mg via INTRAMUSCULAR
  Filled 2020-05-19: qty 1

## 2020-05-19 MED ORDER — LIDOCAINE VISCOUS HCL 2 % MT SOLN
15.0000 mL | Freq: Once | OROMUCOSAL | Status: AC
  Administered 2020-05-19: 15 mL via ORAL
  Filled 2020-05-19: qty 15

## 2020-05-19 MED ORDER — LIDOCAINE 5 % EX PTCH
1.0000 | MEDICATED_PATCH | CUTANEOUS | Status: DC
  Administered 2020-05-19: 1 via TRANSDERMAL
  Filled 2020-05-19 (×2): qty 1

## 2020-05-19 MED ORDER — OXYCODONE HCL 5 MG PO TABS: 5.0000 mg | ORAL_TABLET | Freq: Four times a day (QID) | ORAL | Status: DC | PRN

## 2020-05-19 MED ORDER — METOCLOPRAMIDE HCL 5 MG/ML IJ SOLN
10.0000 mg | Freq: Three times a day (TID) | INTRAMUSCULAR | Status: DC
  Administered 2020-05-19 (×2): 10 mg via INTRAVENOUS
  Filled 2020-05-19 (×2): qty 2

## 2020-05-19 MED ORDER — MORPHINE SULFATE 10 MG/5ML PO SOLN
2.0000 mg | ORAL | Status: DC | PRN
  Administered 2020-05-20: 4 mg via ORAL
  Filled 2020-05-19: qty 5

## 2020-05-19 MED ORDER — MECLIZINE HCL 25 MG PO TABS
25.0000 mg | ORAL_TABLET | Freq: Four times a day (QID) | ORAL | Status: DC | PRN
  Administered 2020-05-19: 25 mg via ORAL
  Filled 2020-05-19: qty 1

## 2020-05-19 MED ORDER — PROMETHAZINE HCL 25 MG RE SUPP: 25.0000 mg | RECTAL | Status: DC | PRN

## 2020-05-19 MED ORDER — SODIUM CHLORIDE 0.9 % IV BOLUS
1000.0000 mL | Freq: Once | INTRAVENOUS | Status: AC
  Administered 2020-05-19: 1000 mL via INTRAVENOUS

## 2020-05-19 MED ORDER — TECHNETIUM TO 99M ALBUMIN AGGREGATED
3.7200 | Freq: Once | INTRAVENOUS | Status: AC | PRN
  Administered 2020-05-19: 3.72 via INTRAVENOUS

## 2020-05-19 MED ORDER — OXYCODONE HCL 5 MG PO TABS
5.0000 mg | ORAL_TABLET | Freq: Once | ORAL | Status: AC
  Administered 2020-05-19: 5 mg via ORAL
  Filled 2020-05-19: qty 1

## 2020-05-19 NOTE — Progress Notes (Signed)
  Echocardiogram 2D Echocardiogram has been performed.  Katja Blue G Allannah Kempen 05/19/2020, 1:45 PM

## 2020-05-19 NOTE — Care Management Important Message (Signed)
Important Message  Patient Details IM Letter given to [name] RN Case Manager to present to the Patient Name: Edwin Hall MRN: [id number] Date of Birth: 1965-11-17   Medicare Important Message Given:  Yes     Caren Macadam 05/19/2020, 10:59 AM

## 2020-05-19 NOTE — Progress Notes (Signed)
Pt lost peripheral IV access and had midline placed before procedure. Midline blew around 1730. IV consulted and they suggest a picc line being placed. States they would not be able to place one until tomorrow. IV access lost at this time. Provider made aware.

## 2020-05-19 NOTE — Progress Notes (Signed)
Pt states he feels pressure in chest. Reports no SOB or dizziness. Vitals obtain. Provider put in new orders. Will continue to monitor pt.

## 2020-05-19 NOTE — TOC Progression Note (Signed)
Transition of Care [facility]) - Progression Note    Patient Details  Name: Edwin Hall MRN: [id number] Date of Birth: 10/05/1966  Transition of Care [facility]) CM/SW Contact  Geni Bers, RN Phone Number: 05/19/2020, 3:37 PM  Clinical Narrative:    TOC will follow pt for discharge needs.   Expected Discharge Plan: Home/Self Care Barriers to Discharge: No Barriers Identified  Expected Discharge Plan and Services Expected Discharge Plan: Home/Self Care       Living arrangements for the past 2 months: Single Family Home                                       Social Determinants of Health (SDOH) Interventions    Readmission Risk Interventions No flowsheet data found.

## 2020-05-19 NOTE — Progress Notes (Signed)
PROGRESS NOTE    Edwin Hall  ZOX:[id number] DOB: 1966-07-22 DOA: 05/15/2020 PCP: Charlott Rakes, MD   Chef Complaints: chest pain  Brief Narrative: 54 y.o. male with medical history significant for irritable bowel syndrome, hyperlipidemia, and hiatal hernia status post surgical repair at [facility] in [place] on 03/17/2020, now presenting to emergency department with abdominal pain, nausea, vomiting, chest pain, and shortness of breath.  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  He continues to have the symptoms and then developed chest pain and shortness of breath a couple weeks ago for which she was seen at [facility] last month and found to have a small left-sided pneumothorax that resolved without treatment.  His abdominal pain, nausea, and vomiting began to worsen over the past few days and he has developed recurrent pain in the left chest, shortness of breath, and mild nonproductive cough.Abdominal pain is mainly in the left lower and mid abdomen without alleviating or exacerbating factors.He denies any diarrhea, melena, or hematochezia.  He has been taking Phenergan at home with some relief. He reports history of childhood asthma but has not used an inhaler in many years and denies any history of smoking or COPD.  ED Course: Upon arrival to the ED, patient is found to be afebrile, saturating well on room air, slightly tachypneic, and with stable blood pressure.Chemistry panel and CBC are unremarkable, troponin undetectable, and urinalysis with 20 ketones.CT of the chest/abdomen/pelvis is concerning for new/recurrent left pneumothorax, approximately 20%, stable left sixth and eighth rib fractures adjacent to seventh rib osteotomy, and mildly dilated  mid to distal esophagus with air-fluid level that appears decreased from CT several days ago.  Patient was given a liter of saline, multiple doses of morphine and Phenergan, and cardiothoracic surgery was consulted by the ED physician.  Surgeon recommended a medical admission  Subjective: Resting.  Was complaining of left-sided chest pain nausea this afternoon.  He appeared calm alert awake and not in acute distress during these episodes Inquired about port placement. Discussed with him about starting Reglan- he wants to continue with his Phenergan.  Assessment & Plan:  Pneumothorax recurrent, in the setting of left thoracotomy for hiatal hernia repair at [facility] last month, small to moderate-sized pneumothorax present-no evidence of tension, I discussed with Dr. Cliffton Asters who has reviewed the imaging and did not advise chest tube insertion at this time and has advised outpatient follow-up with patient's primary surgical team at [facility].  Left apical pneumothorax remains stable on multiple reimaging.  Complains of reproducible chest pain on palpation on the left side suspect this is in the setting of his surgery, seventh rib osteotomy with associated 6 and 8 rib fracture, post op status.  Continues to complain of pain add lidocaine patch on morphine IV add oxycodone p.o.  We did a D-dimer that was normal on 7/4 and today did ventilation/perfusion scan and perfusion scan with no PE. Had Gastrografin swallow eval- no acute findings  No leak and underwent had EGD 7/4.  CT chest without contrast 7/4 no other acute findings.  Obtaining echocardiogram for chest pain, ekg trop are normal. Continue on pain management.  Continue PPI  Nausea & vomiting abdominal pain.Unclear etiology has IBS.Recent diaphragmatic  hernia surgery in May at [facility] suspect all related to his postop status.  Seen by GI underwent EGD that showed long segment Barrett's esophagus, biopsied, fundoplication was found, nonbleeding gastric ulcer with a  clean ulcer base that was biopsied.  GI advised resume regular diet, follow-up with pathology results, continue sucralfate 1 g twice daily for 2 weeks.  CT abdomen and pelvis on admit ""mildly dilated mid to distal esophagus with air-fluid level and adjacent postoperative findings at the gastroesophageal junction.right upper quadrant ultrasound unremarkable , had cholecystectomy continue PPI, pain control as above  Dehydration with ketones in the urine continue IV hydration.This is likely in the setting of #2.  Overall improved.  Mildly abnormal LFTs downtrending, right upper quadrant ultrasound unremarkable history of previous cholecystectomy.   DVT prophylaxis:SCD. Code Status: full. Family Communication: plan of care discussed with patient at bedside.  Status is: Inpatient Remains inpatient appropriate because:IV treatments appropriate due to intensity of illness or inability to take PO, Inpatient level of care appropriate due to severity of illness and For monitoring of the pneumothorax, for diet tolerance  Dispo: The patient is from: Home              Anticipated d/c is to: Home              Anticipated d/c date is:1 days              Patient currently is not medically stable to d/c.  Plan to discharge home patient is symptomatically better.    Nutrition: Diet Order            Diet full liquid Room service appropriate? Yes; Fluid consistency: Thin  Diet effective now                Consultants:see note  Procedures:see note Microbiology:see note  Medications: Scheduled Meds: . aspirin EC  81 mg Oral Daily  . Chlorhexidine Gluconate Cloth  6 each Topical Daily  . cholecalciferol  250 mcg Oral Daily  . diatrizoate meglumine-sodium  120 mL Oral Once  . feeding supplement (KATE FARMS STANDARD 1.4)  325 mL Oral BID BM  . feeding supplement (PRO-STAT SUGAR FREE 64)  30 mL Oral Daily  . gabapentin  300 mg Oral Daily  . lidocaine  1 patch Transdermal Q24H  . metoCLOPramide  (REGLAN) injection  10 mg Intravenous Q8H  . mirtazapine  15 mg Oral QHS  . multivitamin with minerals  1 tablet Oral Daily  . pantoprazole (PROTONIX) IV  40 mg Intravenous Q12H  . sucralfate  1 g Oral BID  . tamsulosin  0.4 mg Oral QPC breakfast  . vitamin B-12  100 mcg Oral Daily  . zinc sulfate  220 mg Oral Daily   Continuous Infusions: . lactated ringers Stopped (05/19/20 1346)    Antimicrobials: Anti-infectives (From admission, onward)   None       Objective: Vitals: Today's Vitals   05/19/20 1030 05/19/20 1036 05/19/20 1104 05/19/20 1329  BP:      Pulse:      Resp:      Temp:      TempSrc:      SpO2:      Weight:      Height:      PainSc: 8  8  8   10-Worst pain ever    Intake/Output Summary (Last 24 hours) at 05/19/2020 1627 Last data filed at 05/19/2020 1400 Gross per 24 hour  Intake 3013.9 ml  Output 2650 ml  Net 363.9 ml   Filed Weights   05/15/20 1808 05/16/20 1444  Weight:  53.1 kg 53.9 kg   Weight change:    Intake/Output from previous day: 07/04 0701 - 07/05 0700 In: 3253.9 [P.O.:1320; I.V.:1933.9] Out: 2475 [Urine:2475] Intake/Output this shift: Total I/O In: 1060 [P.O.:480; I.V.:580] Out: 700 [Urine:700]  Examination:  General exam: AAOx3 , NAD, weak appearing. HEENT:Oral mucosa moist, Ear/Nose WNL grossly, dentition normal. Respiratory system: bilaterally clear,no wheezing or crackles,no use of accessory muscle.  Tenderon the left chest. Cardiovascular system: S1 & S2 +, No JVD,. Gastrointestinal system: Abdomen soft, right upper quadrant epigastric area is tender,ND, BS+ Nervous System:Alert, awake, moving extremities and grossly nonfocal Extremities: No edema, distal peripheral pulses palpable.  Skin: No rashes,no icterus. MSK: Normal muscle bulk,tone, power   Data Reviewed: I have personally reviewed following labs and imaging studies CBC: Recent Labs  Lab 05/15/20 1838 05/18/20 1643  WBC 5.9 4.1  HGB 15.0 13.1  HCT 44.9 39.1   MCV 94.9 95.6  PLT 223 178   Basic Metabolic Panel: Recent Labs  Lab 05/15/20 1838 05/18/20 1643 05/19/20 0853  NA 140 138 138  K 4.0 3.8 4.4  CL 104 102 102  CO2 24 27 27   GLUCOSE 112* 103* 99  BUN 13 8 7   CREATININE 1.14 0.69 0.82  CALCIUM 9.2 8.5* 8.9   GFR: Estimated Creatinine Clearance: 80.3 mL/min (by C-G formula based on SCr of 0.82 mg/dL). Liver Function Tests: Recent Labs  Lab 05/15/20 1838 05/18/20 1643 05/19/20 0853  AST 26 64* 42*  ALT 19 158* 121*  ALKPHOS 64 116 102  BILITOT 1.0 0.9 0.7  PROT 7.2 6.1* 5.8*  ALBUMIN 4.1 3.7 3.4*   Recent Labs  Lab 05/15/20 1838  LIPASE 40   No results for input(s): AMMONIA in the last 168 hours. Coagulation Profile: Recent Labs  Lab 05/16/20 0853  INR 1.1   Cardiac Enzymes: No results for input(s): CKTOTAL, CKMB, CKMBINDEX, TROPONINI in the last 168 hours. BNP (last 3 results) No results for input(s): PROBNP in the last 8760 hours. HbA1C: No results for input(s): HGBA1C in the last 72 hours. CBG: No results for input(s): GLUCAP in the last 168 hours. Lipid Profile: No results for input(s): CHOL, HDL, LDLCALC, TRIG, CHOLHDL, LDLDIRECT in the last 72 hours. Thyroid Function Tests: No results for input(s): TSH, T4TOTAL, FREET4, T3FREE, THYROIDAB in the last 72 hours. Anemia Panel: No results for input(s): VITAMINB12, FOLATE, FERRITIN, TIBC, IRON, RETICCTPCT in the last 72 hours. Sepsis Labs: No results for input(s): PROCALCITON, LATICACIDVEN in the last 168 hours.  Recent Results (from the past 240 hour(s))  SARS Coronavirus 2 by RT PCR (hospital order, performed in [facility] hospital lab) Nasopharyngeal Nasopharyngeal Swab     Status: None   Collection Time: 05/15/20 11:34 PM   Specimen: Nasopharyngeal Swab  Result Value Ref Range Status   SARS Coronavirus 2 NEGATIVE NEGATIVE Final    Comment: (NOTE) SARS-CoV-2 target nucleic acids are NOT DETECTED.  The SARS-CoV-2 RNA is generally detectable in  upper and lower respiratory specimens during the acute phase of infection. The lowest concentration of SARS-CoV-2 viral copies this assay can detect is 250 copies / mL. A negative result does not preclude SARS-CoV-2 infection and should not be used as the sole basis for treatment or other patient management decisions.  A negative result may occur with improper specimen collection / handling, submission of specimen other than nasopharyngeal swab, presence of viral mutation(s) within the areas targeted by this assay, and inadequate number of viral copies (<250 copies / mL). A negative result  must be combined with clinical observations, patient history, and epidemiological information.  Fact Sheet for Patients:   BoilerBrush.com.cy  Fact Sheet for Healthcare Providers: https://pope.com/  This test is not yet approved or  cleared by the [place] FDA and has been authorized for detection and/or diagnosis of SARS-CoV-2 by FDA under an Emergency Use Authorization (EUA).  This EUA will remain in effect (meaning this test can be used) for the duration of the COVID-19 declaration under Section 564(b)(1) of the Act, 21 U.S.C. section 360bbb-3(b)(1), unless the authorization is terminated or revoked sooner.  Performed at [facility], [address] [place], [place] [address]       Radiology Studies: DG Chest 2 View  Result Date: 05/18/2020 CLINICAL DATA:  Shortness of breath. EXAM: CHEST - 2 VIEW COMPARISON:  Chest x-ray from yesterday. FINDINGS: The heart size and mediastinal contours are within normal limits. Normal pulmonary vascularity. Unchanged small left hydropneumothorax with left basilar atelectasis/scarring. The right lung is clear. Unchanged left-sided rib fractures. IMPRESSION: 1. Unchanged small left hydropneumothorax. Electronically Signed   By: Obie Dredge M.D.   On: 05/18/2020 16:14   CT CHEST WO  CONTRAST  Result Date: 05/18/2020 CLINICAL DATA:  Chest pain and shortness of breath. EXAM: CT CHEST WITHOUT CONTRAST TECHNIQUE: Multidetector CT imaging of the chest was performed following the standard protocol without IV contrast. COMPARISON:  Chest CT dated 05/15/2020 FINDINGS: Cardiovascular: The ascending aorta measures up to 3.7 cm. Normal heart size. There is a small pericardial effusion, unchanged. Mediastinum/Nodes: No enlarged mediastinal or axillary lymph nodes. Thyroid gland, trachea, and esophagus demonstrate no significant findings. Lungs/Pleura: The patient is status post a left lower lobe wedge resection with elevation of the left hemidiaphragm. There is a moderate left pneumothorax which is not significantly changed since 05/15/2020. The lungs are clear and there is no pleural effusion. There is no right pneumothorax. Upper Abdomen: Postoperative changes are noted near the gastroesophageal junction, unchanged. Enteric contrast is seen the colon. Musculoskeletal: A right seventh rib osteotomy is noted. Chronic fractures of the sixth and eighth ribs without bony bridging are redemonstrated. IMPRESSION: Moderate left pneumothorax, not significantly changed since 05/15/2020. Aortic Atherosclerosis (ICD10-I70.0). Electronically Signed   By: Romona Curls M.D.   On: 05/18/2020 17:24   NM Pulmonary Perfusion  Result Date: 05/19/2020 CLINICAL DATA:  Nonspecific chest pain and shortness of breath. Nausea and vomiting. EXAM: NUCLEAR MEDICINE PERFUSION LUNG SCAN TECHNIQUE: Perfusion images were obtained in multiple projections after intravenous injection of radiopharmaceutical. Ventilation scans intentionally deferred if perfusion scan and chest x-ray adequate for interpretation during COVID 19 epidemic. RADIOPHARMACEUTICALS:  3.72 mCi Tc-31m MAA IV COMPARISON:  Chest x-ray today FINDINGS: Multiple projection perfusion images demonstrate no focal peripheral wedge-shaped defects. Normal bilateral perfusion  is present. IMPRESSION: Pulmonary embolism absent. Electronically Signed   By: Elberta Fortis M.D.   On: 05/19/2020 16:03   DG Chest Port 1 View  Result Date: 05/19/2020 CLINICAL DATA:  History of pneumothorax. EXAM: PORTABLE CHEST 1 VIEW COMPARISON:  May 18, 2020 FINDINGS: Small left apical pneumothorax is unchanged compared prior exam. Small left pleural effusion is noted. The right lung is clear. Mediastinal contour and cardiac silhouette are normal. The bony structures are stable. IMPRESSION: Small left apical pneumothorax is unchanged compared prior exam. Electronically Signed   By: Sherian Rein M.D.   On: 05/19/2020 12:41   US ABDOMEN LIMITED RUQ  Result Date: 05/19/2020 CLINICAL DATA:  54 year old male with history of abnormal liver function tests.  EXAM: ULTRASOUND ABDOMEN LIMITED RIGHT UPPER QUADRANT COMPARISON:  No priors. FINDINGS: Gallbladder: Status post cholecystectomy. Common bile duct: Diameter: 3.4 mm in the porta hepatis. Liver: No focal lesion identified. Within normal limits in parenchymal echogenicity. Portal vein is patent on color Doppler imaging with normal direction of blood flow towards the liver. Other: None. IMPRESSION: 1. Status post cholecystectomy. 2. Otherwise, unremarkable right upper quadrant ultrasound. Electronically Signed   By: Trudie Reed M.D.   On: 05/19/2020 09:34     LOS: 4 days   Lanae Boast, MD [facility]  05/19/2020, 4:27 PM

## 2020-05-20 ENCOUNTER — Encounter (HOSPITAL_COMMUNITY): Payer: Self-pay | Admitting: Gastroenterology

## 2020-05-20 MED ORDER — PRO-STAT SUGAR FREE PO LIQD: 30.0000 mL | Freq: Every day | ORAL | 0 refills | Status: AC

## 2020-05-20 MED ORDER — PROMETHAZINE HCL 25 MG PO TABS: 25.0000 mg | ORAL_TABLET | Freq: Four times a day (QID) | ORAL | 0 refills | Status: DC | PRN

## 2020-05-20 MED ORDER — SUCRALFATE 1 G PO TABS
1.0000 g | ORAL_TABLET | Freq: Two times a day (BID) | ORAL | 0 refills | Status: DC
Start: 2020-05-20 — End: 2020-10-17

## 2020-05-20 MED ORDER — KATE FARMS STANDARD 1.4 PO LIQD: 325.0000 mL | Freq: Two times a day (BID) | ORAL | 0 refills | Status: AC

## 2020-05-20 MED ORDER — METOCLOPRAMIDE HCL 10 MG PO TABS
10.0000 mg | ORAL_TABLET | Freq: Three times a day (TID) | ORAL | 0 refills | Status: DC | PRN
Start: 2020-05-20 — End: 2020-10-06

## 2020-05-20 MED ORDER — OXYCODONE HCL 5 MG PO TABS: 5.0000 mg | ORAL_TABLET | Freq: Four times a day (QID) | ORAL | 0 refills | Status: DC | PRN

## 2020-05-20 MED ORDER — PANTOPRAZOLE SODIUM 40 MG PO TBEC: 40.0000 mg | DELAYED_RELEASE_TABLET | Freq: Two times a day (BID) | ORAL | 0 refills | Status: DC

## 2020-05-20 NOTE — Discharge Summary (Signed)
Physician Discharge Summary  IDEN STRIPLING ZTI:[id number] DOB: [birth year] DOA: 05/15/2020  PCP: Charlott Rakes, MD  Admit date: 05/15/2020 Discharge date: 05/20/2020  Admitted From: home Disposition:  home  Recommendations for Outpatient Follow-up:  1. Follow up with GI and CT surgery at [facility] Achilles Dunk in 5 days 2. Please obtain BMP/CBC in one week 3. Please follow up on the following pending results:  Home Health: yes  Equipment/Devices: none  Discharge Condition: Stable Code Status: FULL Diet recommendation:  Diet Order            Diet - low sodium heart healthy           Diet general           Diet regular Room service appropriate? Yes; Fluid consistency: Thin  Diet effective now                  Brief/Interim Summary:  54 y.o.malewith medical history significant forirritable bowel syndrome, hyperlipidemia, and hiatal hernia status post surgical repair at [facility] in [place] on 03/17/2020, now presenting to emergency department with abdominal pain, nausea, vomiting, chest pain, and shortness of breath. 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 He continues to have the symptoms and then developed chest pain and shortness of breath a couple weeks ago for which she was seen at [facility] last month and found to have a small left-sided pneumothorax that resolved without treatment. His abdominal pain, nausea, and vomiting began to worsen over the past few days and he has developed recurrent pain in the left chest, shortness of breath, and mild nonproductive cough.Abdominal pain is mainly in the left lower and mid abdomen without alleviating or exacerbating factors.He denies any diarrhea, melena, or hematochezia. He has been taking  Phenergan at home with some relief. He reports history of childhood asthma but has not used an inhaler in many years and denies any history of smoking or COPD.  ED Course:Upon arrival to the ED, patient is found to be afebrile, saturating well on room air, slightly tachypneic, and with stable blood pressure.Chemistry panel and CBC are unremarkable, troponin undetectable, and urinalysis with 20 ketones.CT of the chest/abdomen/pelvis is concerning for new/recurrent left pneumothorax, approximately 20%, stable left sixth and eighth rib fractures adjacent to seventh rib osteotomy, and mildly dilated mid to distal esophagus with air-fluid level that appears decreased from CT several days ago. Patient was given a liter of saline, multiple doses of morphine and Phenergan, and cardiothoracic surgery was consulted by the ED physician.Surgeon recommended a medical admission Patient was evaluated by IR and CT surgery and felt that he does not need chest tube at this time and advised outpatient follow-up patient had serial chest x-ray and CT chest and pneumothorax has remained stable and without any shortness of breath need for oxygen. Patient continued to complain of nausea left chest pain reproducible and underwent extensive evaluation. Patient underwent extensive work-up with EGD report as below basically with long segment Barrett esophagus, gastric ulcer and GI cleared the patient for discharge home on regular diet Carafate and Protonix.  Patient continued to receive IV opiates and Phenergan for pain control.  Patient reported he felt nonnarcotics lidocaine and other options not effective. Due to complaint of chest pain he had further evaluation with EKG and  serial troponin that were unremarkable.  He had episodes where he would get nauseous and chest pain but on exam he was very calm comfortable alert awake not in acute distress, heart rate in 60s to 70s no need for supplemental oxygen with saturation 98-  100%. Continued to keep him in-house, at this time patient is tolerating his Ensure and diet supplement and also eating his meal although not full, he is advised to continue on GI regimen, pain control in the setting of left 7 osteotomy rib along with 6-8 stable rib fracture which could be the chest pain etiology. He also had echocardiogram no acute finding, D-dimer negative and perfusion scan with no evidence of PE. At this time patient is tolerating diet although he does endorse some symptoms of nausea but controlled on Phenergan and patient remained hemodynamically stable consultants have signed off it was felt that no further inpatient work-up was warranted and he was discharged home in medically stable condition. Flomax discontinued as likely it was making him dizzy and he had a positive orthostatics 7/5 and this morning orthostatic vitals are negative.  Can follow-up with urology as needed for urine retention.  Discharge Diagnoses:  Principal Problem:   Pneumothorax Active Problems:   Nausea & vomiting   Unintentional weight loss  Pneumothorax recurrent, in the setting of left thoracotomy for hiatal hernia repair at [facility] last month, small to moderate-sized pneumothorax present-no evidence of tension, I discussed with Dr. Cliffton Asters who has reviewed the imaging and did not advise chest tube insertion at this time and has advised outpatient follow-up with patient's primary surgical team at [facility].  Left apical pneumothorax remains stable on multiple reimaging.  Complains of reproducible chest pain on palpation on the left side suspect this is in the setting of his surgery, seventh rib osteotomy with associated 6 and 8 rib fracture, post op status.  Continues to complain of pain add lidocaine patch on morphine IV add oxycodone p.o.  We did a D-dimer that was normal on 7/4 and today did ventilation/perfusion scan and perfusion scan with no PE. Had Gastrografin swallow eval- no acute findings  No leak and  underwent had EGD 7/4.  CT chest without contrast 7/4 no other acute findings.   Echocardiogram no acute finding, perfusion scan negative for PE.  Given his IV dye allergy will tred to avoid IV contrast.His serial troponin EKG unremarkable.  We will continue on the current GI regimen pain control lidocaine, I did prescribe him oxycodone for few days-after explaining risk benefits and alternatives.  Nausea & vomiting abdominal pain.Unclear etiology has IBS.Recent diaphragmatic  hernia surgery in May at [facility] suspect all related to his postop status.  Seen by GI underwent EGD that showed long segment Barrett's esophagus, biopsied, fundoplication was found, nonbleeding gastric ulcer with a clean ulcer base that was biopsied.  GI advised resume regular diet, follow-up with pathology results, continue sucralfate 1 g twice daily for 2 weeks.  CT abdomen and pelvis on admit ""mildly dilated mid to distal esophagus with air-fluid level and adjacent postoperative findings at the gastroesophageal junction.right upper quadrant ultrasound unremarkable , had cholecystectomy continue PPI, pain control as above, Carafate.  Dehydration with ketones in the urine continue IV hydration.not tolerating diet, resolved.  Orthostatic vitals are negative this morning.    Mildly abnormal LFTs downtrending, right upper quadrant ultrasound unremarkable history of previous cholecystectomy  Patient was instructed to follow-up with PCP in 1 week and with his GI doctor and CT surgery regarding his ongoing  nausea issue as well as regarding his pneumothorax and postop follow-up within this week at [facility] If patient has any shortness of breath recurrent or severe chest pain he needs to seek immediate medical attention. Consults:  GI  CT surgery  Subjective: SOME NAUSEA Ate his breakfast 50%-75% or so and has been drinking his ensure/protein well no vomiting  Discharge Exam: Vitals:   05/20/20 0444 05/20/20 1124  BP:  99/74 120/80  Pulse: 67 81  Resp: 18 20  Temp: 97.8 F (36.6 C) 98.2 F (36.8 C)  SpO2: 99% 100%   General: Pt is alert, awake, not in acute distress Cardiovascular: RRR, S1/S2 +, no rubs, no gallops Respiratory: CTA bilaterally, no wheezing, no rhonchi Abdominal: Soft, NT, ND, bowel sounds + Extremities: no edema, no cyanosis  Discharge Instructions  Discharge Instructions    Diet - low sodium heart healthy   Complete by: As directed    Diet general   Complete by: As directed    Discharge instructions   Complete by: As directed    Please call call MD or return to ER for similar or worsening recurring problem that brought you to hospital or if any fever,nausea/vomiting,abdominal pain, uncontrolled pain, chest pain,  shortness of breath or any other alarming symptoms.  You will need to follow-up with her CT surgery regarding pneumothorax within a week.  If you have worsening shortness of breath chest pain please notify your MD or return to the ED for evaluation.  Please follow-up your doctor as instructed in a week time and call the office for appointment.  Please avoid alcohol, smoking, or any other illicit substance and maintain healthy habits including taking your regular medications as prescribed.  You were cared for by a hospitalist during your hospital stay. If you have any questions about your discharge medications or the care you received while you were in the hospital after you are discharged, you can call the unit and ask to speak with the hospitalist on call if the hospitalist that took care of you is not available.  Once you are discharged, your primary care physician will handle any further medical issues. Please note that NO REFILLS for any discharge medications will be authorized once you are discharged, as it is imperative that you return to your primary care physician (or establish a relationship with a primary care physician if you do not have one) for your aftercare  needs so that they can reassess your need for medications and monitor your lab values   Discharge instructions   Complete by: As directed    Please call and follow up with your gi doctor and ct surgery at [facility] this week. Cont ensure, protein supplement.  Please call call MD or return to ER for similar or worsening recurring problem that brought you to hospital or if any fever,nausea/vomiting,abdominal pain, uncontrolled pain, chest pain,  shortness of breath or any other alarming symptoms.  Please follow-up your doctor as instructed in a week time and call the office for appointment.  Please avoid alcohol, smoking, or any other illicit substance and maintain healthy habits including taking your regular medications as prescribed.  You were cared for by a hospitalist during your hospital stay. If you have any questions about your discharge medications or the care you received while you were in the hospital after you are discharged, you can call the unit and ask to speak with the hospitalist on call if the hospitalist that took care of you is not  available.  Once you are discharged, your primary care physician will handle any further medical issues. Please note that NO REFILLS for any discharge medications will be authorized once you are discharged, as it is imperative that you return to your primary care physician (or establish a relationship with a primary care physician if you do not have one) for your aftercare needs so that they can reassess your need for medications and monitor your lab values.  You have been prescribed opiate for pain management given postop pain for short-term only- please use it only if you have severe pain and minimize the use given the risk of dependence, tolerance and other side effects associated  with it as discussed.   Increase activity slowly   Complete by: As directed    Increase activity slowly   Complete by: As directed      Allergies as of 05/20/2020       Reactions   Other Anaphylaxis   All seafood    Shellfish Allergy Anaphylaxis   Iodine Hives   Tetracyclines & Related Itching   Zofran [ondansetron Hcl] Itching, Rash      Medication List    TAKE these medications   aspirin 81 MG EC tablet Take 81 mg by mouth daily. Swallow whole.   feeding supplement (KATE FARMS STANDARD 1.4) Liqd liquid Take 325 mLs by mouth 2 (two) times daily between meals for 14 days.   feeding supplement (PRO-STAT SUGAR FREE 64) Liqd Take 30 mLs by mouth daily.   gabapentin 300 MG capsule Commonly known as: NEURONTIN Take 300 mg by mouth daily.   Ginkgo Biloba 40 MG Tabs Take 40 mg by mouth daily.   ipratropium 0.06 % nasal spray Commonly known as: ATROVENT USE TWO SPRAYS IN EACH NOSTRIL TWICE DAILY AS DIRECTED. What changed: See the new instructions.   Iron 325 (65 Fe) MG Tabs Take 325 mg by mouth daily.   IRON PO Take 65 mg by mouth daily.   metoCLOPramide 10 MG tablet Commonly known as: REGLAN Take 1 tablet (10 mg total) by mouth every 8 (eight) hours as needed for up to 15 doses for refractory nausea / vomiting (if not releived with phenergan).   mirtazapine 15 MG tablet Commonly known as: REMERON Take 15 mg by mouth at bedtime.   multivitamin with minerals Tabs tablet Take 1 tablet by mouth daily.   oxyCODONE 5 MG immediate release tablet Commonly known as: Oxy IR/ROXICODONE Take 1 tablet (5 mg total) by mouth every 6 (six) hours as needed for up to 10 doses for severe pain.   pantoprazole 40 MG tablet Commonly known as: PROTONIX Take 1 tablet (40 mg total) by mouth 2 (two) times daily.   promethazine 25 MG tablet Commonly known as: PHENERGAN Take 1 tablet (25 mg total) by mouth every 6 (six) hours as needed for up to 7 days for nausea or vomiting.   sucralfate 1 g tablet Commonly known as: Carafate Take 1 tablet (1 g total) by mouth 2 (two) times daily for 14 days.   VITAMIN B 12 PO Take 1 capsule by mouth daily.    VITAMIN D3 PO Take 250 mcg by mouth daily.   Zinc 50 MG Tabs Take 50 mg by mouth daily.       Follow-up Information    Charlott RakesHodges, Francisco, MD Follow up in 1 week(s).   Specialty: Family Medicine Contact information: [address] [place] [address] [phone]  Fuller Mandril, MD Follow up in 5 day(s).   Specialty: Surgery Why: pleae call his office Contact information: [address] [place] [address] [phone]        [facility] Follow up.   Specialty: Home Health Services Why: for Home Health Nurse Contact information: [address] [place] [address] [phone]              Allergies  Allergen Reactions  . Other Anaphylaxis    All seafood   . Shellfish Allergy Anaphylaxis  . Iodine Hives  . Tetracyclines & Related Itching  . Zofran [Ondansetron Hcl] Itching and Rash    The results of significant diagnostics from this hospitalization (including imaging, microbiology, ancillary and laboratory) are listed below for reference.    Microbiology: Recent Results (from the past 240 hour(s))  SARS Coronavirus 2 by RT PCR (hospital order, performed in [facility] hospital lab) Nasopharyngeal Nasopharyngeal Swab     Status: None   Collection Time: 05/15/20 11:34 PM   Specimen: Nasopharyngeal Swab  Result Value Ref Range Status   SARS Coronavirus 2 NEGATIVE NEGATIVE Final    Comment: (NOTE) SARS-CoV-2 target nucleic acids are NOT DETECTED.  The SARS-CoV-2 RNA is generally detectable in upper and lower respiratory specimens during the acute phase of infection. The lowest concentration of SARS-CoV-2 viral copies this assay can detect is 250 copies / mL. A negative result does not preclude SARS-CoV-2 infection and should not be used as the sole basis for treatment or other patient management decisions.  A negative result may occur with improper specimen collection / handling, submission of  specimen other than nasopharyngeal swab, presence of viral mutation(s) within the areas targeted by this assay, and inadequate number of viral copies (<250 copies / mL). A negative result must be combined with clinical observations, patient history, and epidemiological information.  Fact Sheet for Patients:   BoilerBrush.com.cy  Fact Sheet for Healthcare Providers: https://pope.com/  This test is not yet approved or  cleared by the [place] FDA and has been authorized for detection and/or diagnosis of SARS-CoV-2 by FDA under an Emergency Use Authorization (EUA).  This EUA will remain in effect (meaning this test can be used) for the duration of the COVID-19 declaration under Section 564(b)(1) of the Act, 21 U.S.C. section 360bbb-3(b)(1), unless the authorization is terminated or revoked sooner.  Performed at [facility], [address], [place], [place] [address]     Procedures/Studies: CT Abdomen Pelvis Wo Contrast  Addendum Date: 05/15/2020   ADDENDUM REPORT: 05/15/2020 20:09 ADDENDUM: The original report was by Dr. Gaylyn Rong. The following addendum is by Dr. Gaylyn Rong: Critical Value/emergent results were called by telephone at the time of interpretation on 05/15/2020 at 7:59 pm to provider Dr. Tilden Fossa , who verbally acknowledged these results. Electronically Signed   By: Gaylyn Rong M.D.   On: 05/15/2020 20:09   Result Date: 05/15/2020 CLINICAL DATA:  Abdominal pain, vomiting, constipation, fever. Recent diaphragmatic hernia repair. EXAM: CT CHEST, ABDOMEN AND PELVIS WITHOUT CONTRAST TECHNIQUE: Multidetector CT imaging of the chest, abdomen and pelvis was performed following the standard protocol without IV contrast. COMPARISON:  05/11/2020 exam from [facility] FINDINGS: CT CHEST FINDINGS Cardiovascular: Atherosclerotic calcification of the left subclavian artery and left  anterior descending coronary artery. Mild ascending thoracic aortic ectasia at 3.7 cm, without overt aneurysm. Mediastinum/Nodes: Mildly dilated mid to distal esophagus with air-fluid level and adjacent postoperative findings. Mildly complex appearance of the gastroesophageal  junction with anterior deviation of the esophagus along the postoperative findings at the gastroesophageal junction. There is previously a more dilated appearance of the distal esophagus which may have represented a hiatal hernia, I am less certain of a hiatal hernia on today's CT. Assessment of this region is complicated by considerable elevation of the left hemidiaphragm, with postoperative findings along the medial left hemidiaphragm for example on image 44/3. No appreciable pneumomediastinum. Lungs/Pleura: There is a new moderate left pneumothorax, about 20% of left hemithoracic volume similar postoperative findings along the left lung base. No shift of cardiac or mediastinal structures to the right. Musculoskeletal: Left seventh rib osteotomy. Fractures of the left sixth and eighth ribs adjacent to the site of osteotomy, with some early healing response but no bridging. CT ABDOMEN PELVIS FINDINGS Hepatobiliary: Prior cholecystectomy.  Otherwise unremarkable. Pancreas: Unremarkable Spleen: Unremarkable Adrenals/Urinary Tract: Unremarkable Stomach/Bowel: Postoperative findings along the proximal stomach with complex appearance at the gastroesophageal junction possibly from fundoplication. There is oral contrast medium in the distal colon, possibly left over from prior scans. Vascular/Lymphatic: Aortoiliac atherosclerotic vascular disease. Reproductive: Suspected moderate proximal retraction of the testes along the spermatic cords. Other: No supplemental non-categorized findings. Musculoskeletal: Grade 1 degenerative anterolisthesis at L4-5. IMPRESSION: 1. New moderate left pneumothorax, about 20% of left hemithoracic volume. Cause/origin of  this pneumothorax uncertain. 2. Stable fractures of the left sixth and eighth ribs adjacent to the site of the left seventh rib osteotomy, with some early healing response but no bony bridging. 3. Mildly dilated mid to distal esophagus with air-fluid level and adjacent postoperative findings at the gastroesophageal junction. The degree of distal esophageal dilatation is less striking than prior, given the complexity of the appearance of the distal esophagus and proximal stomach, it is difficult to completely exclude a small hiatal hernia although hernia is less obvious on the 05/11/2020 CT scan. 4. Elevated left hemidiaphragm with findings suggesting diaphragmatic repair. 5. Aortic atherosclerosis. Coronary atherosclerosis. 6. Suspected moderate proximal retraction of the testes along the spermatic cords. 7. Grade 1 degenerative anterolisthesis at L4-5. Aortic Atherosclerosis (ICD10-I70.0). Radiology assistant personnel have been notified to put me in telephone contact with the referring physician or the referring physician's clinical representative in order to discuss these findings. Once this communication is established I will issue an addendum to this report for documentation purposes. Electronically Signed: By: Gaylyn Rong M.D. On: 05/15/2020 19:57   DG Chest 2 View  Result Date: 05/18/2020 CLINICAL DATA:  Shortness of breath. EXAM: CHEST - 2 VIEW COMPARISON:  Chest x-ray from yesterday. FINDINGS: The heart size and mediastinal contours are within normal limits. Normal pulmonary vascularity. Unchanged small left hydropneumothorax with left basilar atelectasis/scarring. The right lung is clear. Unchanged left-sided rib fractures. IMPRESSION: 1. Unchanged small left hydropneumothorax. Electronically Signed   By: Obie Dredge M.D.   On: 05/18/2020 16:14   DG Chest 2 View  Result Date: 05/17/2020 CLINICAL DATA:  Status post esophageal surgery several days prior, pneumothorax. EXAM: CHEST - 2 VIEW  COMPARISON:  May 15, 2020 FINDINGS: Small to moderate left apical pneumothorax is stable compared to prior exam. Small left pleural effusion is identified. The right lung is clear. The mediastinal contour and cardiac silhouette are stable. Bony structures are stable. There is evidence of prior left lateral lower rib healing fracture. IMPRESSION: Persistent small to moderate left apical pneumothorax unchanged. Small left pleural effusion. Electronically Signed   By: Sherian Rein M.D.   On: 05/17/2020 08:16   DG Chest 2 View  Result Date: 05/15/2020 CLINICAL DATA:  Chest pain EXAM: CHEST - 2 VIEW COMPARISON:  05/02/2020, CT 05/15/2020, radiograph 04/30/2017 FINDINGS: Right lung is clear. Chronic pleural and parenchymal scarring at the left base. Small moderate left pneumothorax, best seen at the apex, probably grossly stable in size compared with the recent CT. No midline shift. Stable cardiomediastinal silhouette. Enteral contrast or radiopaque material in the colon. Left seventh rib resection. Left sixth and eighth healing rib fractures. IMPRESSION: Small to moderate left pneumothorax, probably grossly stable compared with CT performed earlier today. Chronic pleural and parenchymal scarring at the left base. Electronically Signed   By: Jasmine Pang M.D.   On: 05/15/2020 21:28   CT CHEST WO CONTRAST  Result Date: 05/18/2020 CLINICAL DATA:  Chest pain and shortness of breath. EXAM: CT CHEST WITHOUT CONTRAST TECHNIQUE: Multidetector CT imaging of the chest was performed following the standard protocol without IV contrast. COMPARISON:  Chest CT dated 05/15/2020 FINDINGS: Cardiovascular: The ascending aorta measures up to 3.7 cm. Normal heart size. There is a small pericardial effusion, unchanged. Mediastinum/Nodes: No enlarged mediastinal or axillary lymph nodes. Thyroid gland, trachea, and esophagus demonstrate no significant findings. Lungs/Pleura: The patient is status post a left lower lobe wedge resection  with elevation of the left hemidiaphragm. There is a moderate left pneumothorax which is not significantly changed since 05/15/2020. The lungs are clear and there is no pleural effusion. There is no right pneumothorax. Upper Abdomen: Postoperative changes are noted near the gastroesophageal junction, unchanged. Enteric contrast is seen the colon. Musculoskeletal: A right seventh rib osteotomy is noted. Chronic fractures of the sixth and eighth ribs without bony bridging are redemonstrated. IMPRESSION: Moderate left pneumothorax, not significantly changed since 05/15/2020. Aortic Atherosclerosis (ICD10-I70.0). Electronically Signed   By: Romona Curls M.D.   On: 05/18/2020 17:24   CT Chest Wo Contrast  Addendum Date: 05/15/2020   ADDENDUM REPORT: 05/15/2020 20:09 ADDENDUM: The original report was by Dr. Gaylyn Rong. The following addendum is by Dr. Gaylyn Rong: Critical Value/emergent results were called by telephone at the time of interpretation on 05/15/2020 at 7:59 pm to provider Dr. Tilden Fossa , who verbally acknowledged these results. Electronically Signed   By: Gaylyn Rong M.D.   On: 05/15/2020 20:09   Result Date: 05/15/2020 CLINICAL DATA:  Abdominal pain, vomiting, constipation, fever. Recent diaphragmatic hernia repair. EXAM: CT CHEST, ABDOMEN AND PELVIS WITHOUT CONTRAST TECHNIQUE: Multidetector CT imaging of the chest, abdomen and pelvis was performed following the standard protocol without IV contrast. COMPARISON:  05/11/2020 exam from [facility]. FINDINGS: CT CHEST FINDINGS Cardiovascular: Atherosclerotic calcification of the left subclavian artery and left anterior descending coronary artery. Mild ascending thoracic aortic ectasia at 3.7 cm, without overt aneurysm. Mediastinum/Nodes: Mildly dilated mid to distal esophagus with air-fluid level and adjacent postoperative findings. Mildly complex appearance of the gastroesophageal junction with anterior deviation of the  esophagus along the postoperative findings at the gastroesophageal junction. There is previously a more dilated appearance of the distal esophagus which may have represented a hiatal hernia, I am less certain of a hiatal hernia on today's CT. Assessment of this region is complicated by considerable elevation of the left hemidiaphragm, with postoperative findings along the medial left hemidiaphragm for example on image 44/3. No appreciable pneumomediastinum. Lungs/Pleura: There is a new moderate left pneumothorax, about 20% of left hemithoracic volume similar postoperative findings along the left lung base. No shift of cardiac or mediastinal structures to the right. Musculoskeletal: Left seventh rib osteotomy. Fractures of the left  sixth and eighth ribs adjacent to the site of osteotomy, with some early healing response but no bridging. CT ABDOMEN PELVIS FINDINGS Hepatobiliary: Prior cholecystectomy.  Otherwise unremarkable. Pancreas: Unremarkable Spleen: Unremarkable Adrenals/Urinary Tract: Unremarkable Stomach/Bowel: Postoperative findings along the proximal stomach with complex appearance at the gastroesophageal junction possibly from fundoplication. There is oral contrast medium in the distal colon, possibly left over from prior scans. Vascular/Lymphatic: Aortoiliac atherosclerotic vascular disease. Reproductive: Suspected moderate proximal retraction of the testes along the spermatic cords. Other: No supplemental non-categorized findings. Musculoskeletal: Grade 1 degenerative anterolisthesis at L4-5. IMPRESSION: 1. New moderate left pneumothorax, about 20% of left hemithoracic volume. Cause/origin of this pneumothorax uncertain. 2. Stable fractures of the left sixth and eighth ribs adjacent to the site of the left seventh rib osteotomy, with some early healing response but no bony bridging. 3. Mildly dilated mid to distal esophagus with air-fluid level and adjacent postoperative findings at the gastroesophageal  junction. The degree of distal esophageal dilatation is less striking than prior, given the complexity of the appearance of the distal esophagus and proximal stomach, it is difficult to completely exclude a small hiatal hernia although hernia is less obvious on the 05/11/2020 CT scan. 4. Elevated left hemidiaphragm with findings suggesting diaphragmatic repair. 5. Aortic atherosclerosis. Coronary atherosclerosis. 6. Suspected moderate proximal retraction of the testes along the spermatic cords. 7. Grade 1 degenerative anterolisthesis at L4-5. Aortic Atherosclerosis (ICD10-I70.0). Radiology assistant personnel have been notified to put me in telephone contact with the referring physician or the referring physician's clinical representative in order to discuss these findings. Once this communication is established I will issue an addendum to this report for documentation purposes. Electronically Signed: By: Gaylyn Rong M.D. On: 05/15/2020 19:57   NM Pulmonary Perfusion  Result Date: 05/19/2020 CLINICAL DATA:  Nonspecific chest pain and shortness of breath. Nausea and vomiting. EXAM: NUCLEAR MEDICINE PERFUSION LUNG SCAN TECHNIQUE: Perfusion images were obtained in multiple projections after intravenous injection of radiopharmaceutical. Ventilation scans intentionally deferred if perfusion scan and chest x-ray adequate for interpretation during COVID 19 epidemic. RADIOPHARMACEUTICALS:  3.72 mCi Tc-64m MAA IV COMPARISON:  Chest x-ray today FINDINGS: Multiple projection perfusion images demonstrate no focal peripheral wedge-shaped defects. Normal bilateral perfusion is present. IMPRESSION: Pulmonary embolism absent. Electronically Signed   By: Elberta Fortis M.D.   On: 05/19/2020 16:03   DG Chest Port 1 View  Result Date: 05/19/2020 CLINICAL DATA:  History of pneumothorax. EXAM: PORTABLE CHEST 1 VIEW COMPARISON:  May 18, 2020 FINDINGS: Small left apical pneumothorax is unchanged compared prior exam. Small left  pleural effusion is noted. The right lung is clear. Mediastinal contour and cardiac silhouette are normal. The bony structures are stable. IMPRESSION: Small left apical pneumothorax is unchanged compared prior exam. Electronically Signed   By: Sherian Rein M.D.   On: 05/19/2020 12:41   DG Chest Port 1 View  Result Date: 05/02/2020 CLINICAL DATA:  Fever, epigastric pain, shortness of breath EXAM: PORTABLE CHEST 1 VIEW COMPARISON:  04/21/2020 FINDINGS: There is small left pleural effusion with left base atelectasis. Previously seen left apical pneumothorax no longer visualized. Heart is normal size. Right lung is clear. IMPRESSION: Small left pleural effusion with left base atelectasis, slightly increased since prior study. Electronically Signed   By: Charlett Nose M.D.   On: 05/02/2020 00:49   ECHOCARDIOGRAM COMPLETE  Result Date: 05/19/2020    ECHOCARDIOGRAM REPORT   Patient Name:   ONEIL BEHNEY Date of Exam: 05/19/2020 Medical Rec #:  [id number]  Height:       68.0 in Accession #:    [id number]      Weight:       118.8 lb Date of Birth:  1966-10-29       BSA:          1.638 m Patient Age:    53 years        BP:           106/82 mmHg Patient Gender: M               HR:           78 bpm. Exam Location:  Inpatient Procedure: 2D Echo, Cardiac Doppler and Color Doppler Indications:    R07.9* Chest pain, unspecified  History:        Patient has no prior history of Echocardiogram examinations.                 Risk Factors:Dyslipidemia.  Sonographer:    Elmarie Shiley Dance Referring Phys: [id number] Larrell Rapozo  Sonographer Comments: Suboptimal subcostal window. IMPRESSIONS  1. Left ventricular ejection fraction, by estimation, is 55 to 60%. The left ventricle has normal function. The left ventricle has no regional wall motion abnormalities. Left ventricular diastolic parameters were normal.  2. Right ventricular systolic function is normal. The right ventricular size is normal. Tricuspid regurgitation signal is  inadequate for assessing PA pressure.  3. The mitral valve is grossly normal. Trivial mitral valve regurgitation.  4. The aortic valve is tricuspid. Aortic valve regurgitation is not visualized.  5. Unable to estimate CVP. FINDINGS  Left Ventricle: Left ventricular ejection fraction, by estimation, is 55 to 60%. The left ventricle has normal function. The left ventricle has no regional wall motion abnormalities. The left ventricular internal cavity size was normal in size. There is  no left ventricular hypertrophy. Left ventricular diastolic parameters were normal. Right Ventricle: The right ventricular size is normal. No increase in right ventricular wall thickness. Right ventricular systolic function is normal. Tricuspid regurgitation signal is inadequate for assessing PA pressure. Left Atrium: Left atrial size was normal in size. Right Atrium: Right atrial size was normal in size. Pericardium: There is no evidence of pericardial effusion. Presence of pericardial fat pad. Mitral Valve: The mitral valve is grossly normal. Trivial mitral valve regurgitation. Tricuspid Valve: The tricuspid valve is grossly normal. Tricuspid valve regurgitation is trivial. Aortic Valve: The aortic valve is tricuspid. Aortic valve regurgitation is not visualized. Pulmonic Valve: The pulmonic valve was grossly normal. Pulmonic valve regurgitation is trivial. Aorta: The aortic root is normal in size and structure. Venous: Unable to estimate CVP. The inferior vena cava was not well visualized. IAS/Shunts: The interatrial septum was not well visualized.  LEFT VENTRICLE PLAX 2D LVIDd:         3.60 cm  Diastology LVIDs:         2.10 cm  LV e' lateral:   12.60 cm/s LV PW:         0.70 cm  LV E/e' lateral: 6.6 LV IVS:        0.90 cm  LV e' medial:    9.79 cm/s LVOT diam:     2.10 cm  LV E/e' medial:  8.5 LV SV:         65 LV SV Index:   40 LVOT Area:     3.46 cm  RIGHT VENTRICLE RV Basal diam:  2.30 cm RV S prime:     11.70 cm/s TAPSE  (M-mode):  2.0 cm LEFT ATRIUM             Index       RIGHT ATRIUM          Index LA diam:        2.50 cm 1.53 cm/m  RA Area:     8.63 cm LA Vol (A2C):   28.6 ml 17.46 ml/m RA Volume:   15.20 ml 9.28 ml/m LA Vol (A4C):   16.7 ml 10.20 ml/m LA Biplane Vol: 23.8 ml 14.53 ml/m  AORTIC VALVE LVOT Vmax:   108.00 cm/s LVOT Vmean:  66.400 cm/s LVOT VTI:    0.189 m  AORTA Ao Root diam: 3.60 cm Ao Asc diam:  3.60 cm MITRAL VALVE MV Area (PHT): 3.77 cm    SHUNTS MV Decel Time: 201 msec    Systemic VTI:  0.19 m MV E velocity: 83.20 cm/s  Systemic Diam: 2.10 cm MV A velocity: 81.90 cm/s MV E/A ratio:  1.02 Nona Dell MD Electronically signed by Nona Dell MD Signature Date/Time: 05/19/2020/5:32:15 PM    Final    US ABDOMEN LIMITED RUQ  Result Date: 05/19/2020 CLINICAL DATA:  54 year old male with history of abnormal liver function tests. EXAM: ULTRASOUND ABDOMEN LIMITED RIGHT UPPER QUADRANT COMPARISON:  No priors. FINDINGS: Gallbladder: Status post cholecystectomy. Common bile duct: Diameter: 3.4 mm in the porta hepatis. Liver: No focal lesion identified. Within normal limits in parenchymal echogenicity. Portal vein is patent on color Doppler imaging with normal direction of blood flow towards the liver. Other: None. IMPRESSION: 1. Status post cholecystectomy. 2. Otherwise, unremarkable right upper quadrant ultrasound. Electronically Signed   By: Trudie Reed M.D.   On: 05/19/2020 09:34   DG ESOPHAGUS W SINGLE CM (SOL OR THIN BA)  Result Date: 05/17/2020 CLINICAL DATA:  54 year old male with dysphagia and prior hiatal hernia repair. EXAM: WATER SOLUBLE UPPER GI SERIES TECHNIQUE: Single-column upper GI series was performed using water soluble contrast. CONTRAST:  Water-soluble contrast, 60 cc COMPARISON:  Upper GI 05/05/2020 FLUOROSCOPY TIME:  Fluoroscopy Time:  1 minutes 30 seconds FINDINGS: Scout images acquired of the chest. Unchanged appearance of the left-sided hydropneumothorax, without significant  enlargement. Left-sided rib fractures again noted. Water-soluble limited/modified esophagram was then performed. Multiple obliquities performed with observation of the contrast bolus through the esophagus. Magnification spot images in multiple obliquities performed at the distal esophagus/GE junction, in the region of prior surgery. Motility of the upper and midesophagus unremarkable, with expected smooth featureless mucosa in the upper and mid esophagus. Surgical changes at the esophageal hiatus, with a similar patulous configuration of the distal esophagus just above the GE junction. Majority of the contrast moved quickly through the GE junction, however, GE junction was never visualized to significantly distend, at the level of the fundoplication. Similar appearance of persisting/recurrent hiatal hernia at the surgical site. No tablet was administered given findings on prior. No extraluminal contrast was identified. IMPRESSION: Scout images again demonstrate small left-sided hydropneumothorax, unchanged since the prior chest x-ray. Water-soluble/limited esophagram demonstrates no evidence of leak. Motility of the upper and mid esophagus within normal limits, with similar relatively slow clearance of the contrast bolus through the GE junction at the surgical site of reported fundoplication. Electronically Signed   By: Gilmer Mor D.O.   On: 05/17/2020 14:11    Labs: BNP (last 3 results) No results for input(s): BNP in the last 8760 hours. Basic Metabolic Panel: Recent Labs  Lab 05/15/20 1838 05/18/20 1643 05/19/20 0853  NA 140 138 138  K 4.0 3.8 4.4  CL 104 102 102  CO2 GLUCOSE 112* 103* 99  BUN CREATININE 1.14 0.69 0.82  CALCIUM 9.2 8.5* 8.9   Liver Function Tests: Recent Labs  Lab 05/15/20 1838 05/18/20 1643 05/19/20 0853  AST 26 64* 42*  ALT 19 158* 121*  ALKPHOS 64 116 102  BILITOT 1.0 0.9 0.7  PROT 7.2 6.1* 5.8*  ALBUMIN 4.1 3.7 3.4*   Recent Labs  Lab  05/15/20 1838  LIPASE 40   No results for input(s): AMMONIA in the last 168 hours. CBC: Recent Labs  Lab 05/15/20 1838 05/18/20 1643  WBC 5.9 4.1  HGB 15.0 13.1  HCT 44.9 39.1  MCV 94.9 95.6  PLT 223 178   Cardiac Enzymes: No results for input(s): CKTOTAL, CKMB, CKMBINDEX, TROPONINI in the last 168 hours. BNP: Invalid input(s): POCBNP CBG: No results for input(s): GLUCAP in the last 168 hours. D-Dimer Recent Labs    05/18/20 1643  DDIMER 0.43   Hgb A1c No results for input(s): HGBA1C in the last 72 hours. Lipid Profile No results for input(s): CHOL, HDL, LDLCALC, TRIG, CHOLHDL, LDLDIRECT in the last 72 hours. Thyroid function studies No results for input(s): TSH, T4TOTAL, T3FREE, THYROIDAB in the last 72 hours.  Invalid input(s): FREET3 Anemia work up No results for input(s): VITAMINB12, FOLATE, FERRITIN, TIBC, IRON, RETICCTPCT in the last 72 hours. Urinalysis    Component Value Date/Time   COLORURINE AMBER (A) 05/16/2020 1800   APPEARANCEUR CLEAR 05/16/2020 1800   LABSPEC 1.021 05/16/2020 1800   PHURINE 5.0 05/16/2020 1800   GLUCOSEU NEGATIVE 05/16/2020 1800   HGBUR NEGATIVE 05/16/2020 1800   BILIRUBINUR NEGATIVE 05/16/2020 1800   KETONESUR 20 (A) 05/16/2020 1800   PROTEINUR NEGATIVE 05/16/2020 1800   NITRITE NEGATIVE 05/16/2020 1800   LEUKOCYTESUR NEGATIVE 05/16/2020 1800   Sepsis Labs Invalid input(s): PROCALCITONIN,  WBC,  LACTICIDVEN Microbiology Recent Results (from the past 240 hour(s))  SARS Coronavirus 2 by RT PCR (hospital order, performed in [facility] hospital lab) Nasopharyngeal Nasopharyngeal Swab     Status: None   Collection Time: 05/15/20 11:34 PM   Specimen: Nasopharyngeal Swab  Result Value Ref Range Status   SARS Coronavirus 2 NEGATIVE NEGATIVE Final    Comment: (NOTE) SARS-CoV-2 target nucleic acids are NOT DETECTED.  The SARS-CoV-2 RNA is generally detectable in upper and lower respiratory specimens during the acute phase of  infection. The lowest concentration of SARS-CoV-2 viral copies this assay can detect is 250 copies / mL. A negative result does not preclude SARS-CoV-2 infection and should not be used as the sole basis for treatment or other patient management decisions.  A negative result may occur with improper specimen collection / handling, submission of specimen other than nasopharyngeal swab, presence of viral mutation(s) within the areas targeted by this assay, and inadequate number of viral copies (<250 copies / mL). A negative result must be combined with clinical observations, patient history, and epidemiological information.  Fact Sheet for Patients:   BoilerBrush.com.cy  Fact Sheet for Healthcare Providers: https://pope.com/  This test is not yet approved or  cleared by the [place] FDA and has been authorized for detection and/or diagnosis of SARS-CoV-2 by FDA under an Emergency Use Authorization (EUA).  This EUA will remain in effect (meaning this test can be used) for the duration of the COVID-19 declaration under Section 564(b)(1) of the Act, 21 U.S.C. section 360bbb-3(b)(1), unless the authorization is terminated or revoked sooner.  Performed at [facility], [address], [place], [place] [address]      Time coordinating discharge: 25  minutes  SIGNED: Lanae Boast, MD  [facility] 05/20/2020, 2:24 PM  If 7PM-7AM, please contact night-coverage [email or web address]

## 2020-05-20 NOTE — TOC Progression Note (Signed)
Transition of Care [facility]) - Progression Note    Patient Details  Name: Edwin Hall MRN: [id number] Date of Birth: 11-20-1965  Transition of Care [facility]) CM/SW Contact  Geni Bers, RN Phone Number: 05/20/2020, 10:36 AM  Clinical Narrative:    Pt discharging with [facility] for [facility].    Expected Discharge Plan: Home/Self Care Barriers to Discharge: No Barriers Identified  Expected Discharge Plan and Services Expected Discharge Plan: Home/Self Care       Living arrangements for the past 2 months: Single Family Home Expected Discharge Date: 05/20/20                                     Social Determinants of Health (SDOH) Interventions    Readmission Risk Interventions No flowsheet data found.

## 2020-05-21 ENCOUNTER — Other Ambulatory Visit: Payer: Self-pay

## 2020-05-21 DIAGNOSIS — K22719 Barrett's esophagus with dysplasia, unspecified: Secondary | ICD-10-CM | POA: Diagnosis not present

## 2020-05-21 DIAGNOSIS — Z7982 Long term (current) use of aspirin: Secondary | ICD-10-CM | POA: Diagnosis not present

## 2020-05-21 DIAGNOSIS — K3189 Other diseases of stomach and duodenum: Secondary | ICD-10-CM | POA: Diagnosis not present

## 2020-05-21 DIAGNOSIS — Z48815 Encounter for surgical aftercare following surgery on the digestive system: Secondary | ICD-10-CM | POA: Diagnosis not present

## 2020-05-21 DIAGNOSIS — K2100 Gastro-esophageal reflux disease with esophagitis, without bleeding: Secondary | ICD-10-CM | POA: Diagnosis not present

## 2020-05-21 DIAGNOSIS — K449 Diaphragmatic hernia without obstruction or gangrene: Secondary | ICD-10-CM | POA: Diagnosis not present

## 2020-05-21 LAB — SURGICAL PATHOLOGY

## 2020-05-22 ENCOUNTER — Other Ambulatory Visit: Payer: Self-pay | Admitting: Family Medicine

## 2020-05-23 ENCOUNTER — Other Ambulatory Visit: Payer: Self-pay | Admitting: *Deleted

## 2020-05-23 DIAGNOSIS — Z48815 Encounter for surgical aftercare following surgery on the digestive system: Secondary | ICD-10-CM | POA: Diagnosis not present

## 2020-05-23 DIAGNOSIS — K2100 Gastro-esophageal reflux disease with esophagitis, without bleeding: Secondary | ICD-10-CM | POA: Diagnosis not present

## 2020-05-23 DIAGNOSIS — Z7982 Long term (current) use of aspirin: Secondary | ICD-10-CM | POA: Diagnosis not present

## 2020-05-23 DIAGNOSIS — K449 Diaphragmatic hernia without obstruction or gangrene: Secondary | ICD-10-CM | POA: Diagnosis not present

## 2020-05-23 DIAGNOSIS — K22719 Barrett's esophagus with dysplasia, unspecified: Secondary | ICD-10-CM | POA: Diagnosis not present

## 2020-05-23 DIAGNOSIS — K3189 Other diseases of stomach and duodenum: Secondary | ICD-10-CM | POA: Diagnosis not present

## 2020-05-23 NOTE — Patient Outreach (Signed)
[facility]) Care Management  05/23/2020  Edwin Hall [birth year] [id number]   EMMI-GENERAL DISCHARGE-UNSUCCESSFUL RED ON EMMI ALERT Day #1 Date:05/22/2020 Red Alert Reason:QUESTIONS/CONCERNS  OUTREACH #1 RN attempted outreach however unsuccessful and unable to leave a message.   PLAN: Will attempted another outreach next week for pending services.  Elliot Cousin, RN Care Management Coordinator [facility] Main Office [phone]

## 2020-05-26 DIAGNOSIS — K2270 Barrett's esophagus without dysplasia: Secondary | ICD-10-CM | POA: Diagnosis not present

## 2020-05-26 DIAGNOSIS — R079 Chest pain, unspecified: Secondary | ICD-10-CM | POA: Diagnosis not present

## 2020-05-26 DIAGNOSIS — K222 Esophageal obstruction: Secondary | ICD-10-CM | POA: Diagnosis not present

## 2020-05-26 DIAGNOSIS — K3189 Other diseases of stomach and duodenum: Secondary | ICD-10-CM | POA: Diagnosis not present

## 2020-05-26 DIAGNOSIS — R933 Abnormal findings on diagnostic imaging of other parts of digestive tract: Secondary | ICD-10-CM | POA: Diagnosis not present

## 2020-05-26 DIAGNOSIS — R1314 Dysphagia, pharyngoesophageal phase: Secondary | ICD-10-CM | POA: Diagnosis not present

## 2020-05-26 DIAGNOSIS — K2970 Gastritis, unspecified, without bleeding: Secondary | ICD-10-CM | POA: Diagnosis not present

## 2020-05-26 DIAGNOSIS — Z452 Encounter for adjustment and management of vascular access device: Secondary | ICD-10-CM | POA: Diagnosis not present

## 2020-05-27 ENCOUNTER — Other Ambulatory Visit: Payer: Self-pay | Admitting: *Deleted

## 2020-05-27 DIAGNOSIS — K2100 Gastro-esophageal reflux disease with esophagitis, without bleeding: Secondary | ICD-10-CM | POA: Diagnosis not present

## 2020-05-27 DIAGNOSIS — K3189 Other diseases of stomach and duodenum: Secondary | ICD-10-CM | POA: Diagnosis not present

## 2020-05-27 DIAGNOSIS — K22719 Barrett's esophagus with dysplasia, unspecified: Secondary | ICD-10-CM | POA: Diagnosis not present

## 2020-05-27 DIAGNOSIS — K449 Diaphragmatic hernia without obstruction or gangrene: Secondary | ICD-10-CM | POA: Diagnosis not present

## 2020-05-27 DIAGNOSIS — Z7982 Long term (current) use of aspirin: Secondary | ICD-10-CM | POA: Diagnosis not present

## 2020-05-27 DIAGNOSIS — Z48815 Encounter for surgical aftercare following surgery on the digestive system: Secondary | ICD-10-CM | POA: Diagnosis not present

## 2020-05-27 NOTE — Patient Outreach (Signed)
[facility]) Care Management  05/27/2020  Edwin Hall 1966/03/27 [id number]   EMMI-GENERAL DISCHARGE-RESOLVED RED ON EMMI ALERT Day # 1 Date:05/22/2020 Red Alert Reason: QUESTIONS/CONCERNS  OUTREACH #1 RN spoke with pt today concerning the above emmi. Pt states he has spoken with his provider pending an appointment on Thursday this week for further resolution. No further needs from this RN case manager.  Plan: Will close with no further needs.  Elliot Cousin, RN Care Management Coordinator [facility] Main Office [phone]

## 2020-05-29 DIAGNOSIS — Z789 Other specified health status: Secondary | ICD-10-CM | POA: Diagnosis not present

## 2020-05-29 DIAGNOSIS — Z681 Body mass index (BMI) 19 or less, adult: Secondary | ICD-10-CM | POA: Diagnosis not present

## 2020-05-29 DIAGNOSIS — E440 Moderate protein-calorie malnutrition: Secondary | ICD-10-CM | POA: Diagnosis not present

## 2020-05-29 DIAGNOSIS — J939 Pneumothorax, unspecified: Secondary | ICD-10-CM | POA: Diagnosis not present

## 2020-05-29 DIAGNOSIS — Z7689 Persons encountering health services in other specified circumstances: Secondary | ICD-10-CM | POA: Diagnosis not present

## 2020-05-30 DIAGNOSIS — K2100 Gastro-esophageal reflux disease with esophagitis, without bleeding: Secondary | ICD-10-CM | POA: Diagnosis not present

## 2020-05-30 DIAGNOSIS — Z7982 Long term (current) use of aspirin: Secondary | ICD-10-CM | POA: Diagnosis not present

## 2020-05-30 DIAGNOSIS — K3189 Other diseases of stomach and duodenum: Secondary | ICD-10-CM | POA: Diagnosis not present

## 2020-05-30 DIAGNOSIS — K22719 Barrett's esophagus with dysplasia, unspecified: Secondary | ICD-10-CM | POA: Diagnosis not present

## 2020-05-30 DIAGNOSIS — Z20828 Contact with and (suspected) exposure to other viral communicable diseases: Secondary | ICD-10-CM | POA: Diagnosis not present

## 2020-05-30 DIAGNOSIS — Z48815 Encounter for surgical aftercare following surgery on the digestive system: Secondary | ICD-10-CM | POA: Diagnosis not present

## 2020-05-30 DIAGNOSIS — Z789 Other specified health status: Secondary | ICD-10-CM | POA: Diagnosis not present

## 2020-05-30 DIAGNOSIS — K449 Diaphragmatic hernia without obstruction or gangrene: Secondary | ICD-10-CM | POA: Diagnosis not present

## 2020-05-31 DIAGNOSIS — Z48813 Encounter for surgical aftercare following surgery on the respiratory system: Secondary | ICD-10-CM | POA: Diagnosis not present

## 2020-05-31 DIAGNOSIS — K449 Diaphragmatic hernia without obstruction or gangrene: Secondary | ICD-10-CM | POA: Diagnosis not present

## 2020-05-31 DIAGNOSIS — K3189 Other diseases of stomach and duodenum: Secondary | ICD-10-CM | POA: Diagnosis not present

## 2020-05-31 DIAGNOSIS — K22719 Barrett's esophagus with dysplasia, unspecified: Secondary | ICD-10-CM | POA: Diagnosis not present

## 2020-05-31 DIAGNOSIS — Z7982 Long term (current) use of aspirin: Secondary | ICD-10-CM | POA: Diagnosis not present

## 2020-06-02 DIAGNOSIS — Z8719 Personal history of other diseases of the digestive system: Secondary | ICD-10-CM | POA: Diagnosis not present

## 2020-06-02 DIAGNOSIS — Z789 Other specified health status: Secondary | ICD-10-CM | POA: Diagnosis not present

## 2020-06-02 DIAGNOSIS — Z9889 Other specified postprocedural states: Secondary | ICD-10-CM | POA: Diagnosis not present

## 2020-06-02 DIAGNOSIS — K219 Gastro-esophageal reflux disease without esophagitis: Secondary | ICD-10-CM | POA: Diagnosis not present

## 2020-06-02 DIAGNOSIS — E785 Hyperlipidemia, unspecified: Secondary | ICD-10-CM | POA: Diagnosis not present

## 2020-06-02 DIAGNOSIS — Z452 Encounter for adjustment and management of vascular access device: Secondary | ICD-10-CM | POA: Diagnosis not present

## 2020-06-02 DIAGNOSIS — J986 Disorders of diaphragm: Secondary | ICD-10-CM | POA: Diagnosis not present

## 2020-06-02 DIAGNOSIS — J948 Other specified pleural conditions: Secondary | ICD-10-CM | POA: Diagnosis not present

## 2020-06-02 DIAGNOSIS — Z7982 Long term (current) use of aspirin: Secondary | ICD-10-CM | POA: Diagnosis not present

## 2020-06-02 DIAGNOSIS — Z79899 Other long term (current) drug therapy: Secondary | ICD-10-CM | POA: Diagnosis not present

## 2020-06-02 DIAGNOSIS — I998 Other disorder of circulatory system: Secondary | ICD-10-CM | POA: Diagnosis not present

## 2020-06-02 DIAGNOSIS — I739 Peripheral vascular disease, unspecified: Secondary | ICD-10-CM | POA: Diagnosis not present

## 2020-06-02 DIAGNOSIS — E7800 Pure hypercholesterolemia, unspecified: Secondary | ICD-10-CM | POA: Diagnosis not present

## 2020-06-03 DIAGNOSIS — Z7982 Long term (current) use of aspirin: Secondary | ICD-10-CM | POA: Diagnosis not present

## 2020-06-03 DIAGNOSIS — K449 Diaphragmatic hernia without obstruction or gangrene: Secondary | ICD-10-CM | POA: Diagnosis not present

## 2020-06-03 DIAGNOSIS — K22719 Barrett's esophagus with dysplasia, unspecified: Secondary | ICD-10-CM | POA: Diagnosis not present

## 2020-06-03 DIAGNOSIS — K3189 Other diseases of stomach and duodenum: Secondary | ICD-10-CM | POA: Diagnosis not present

## 2020-06-03 DIAGNOSIS — Z48813 Encounter for surgical aftercare following surgery on the respiratory system: Secondary | ICD-10-CM | POA: Diagnosis not present

## 2020-06-05 DIAGNOSIS — K3189 Other diseases of stomach and duodenum: Secondary | ICD-10-CM | POA: Diagnosis not present

## 2020-06-05 DIAGNOSIS — Z7982 Long term (current) use of aspirin: Secondary | ICD-10-CM | POA: Diagnosis not present

## 2020-06-05 DIAGNOSIS — K22719 Barrett's esophagus with dysplasia, unspecified: Secondary | ICD-10-CM | POA: Diagnosis not present

## 2020-06-05 DIAGNOSIS — K449 Diaphragmatic hernia without obstruction or gangrene: Secondary | ICD-10-CM | POA: Diagnosis not present

## 2020-06-05 DIAGNOSIS — Z48813 Encounter for surgical aftercare following surgery on the respiratory system: Secondary | ICD-10-CM | POA: Diagnosis not present

## 2020-06-06 DIAGNOSIS — J939 Pneumothorax, unspecified: Secondary | ICD-10-CM | POA: Diagnosis not present

## 2020-06-06 DIAGNOSIS — R0602 Shortness of breath: Secondary | ICD-10-CM | POA: Diagnosis not present

## 2020-06-06 DIAGNOSIS — J9381 Chronic pneumothorax: Secondary | ICD-10-CM | POA: Diagnosis not present

## 2020-06-06 DIAGNOSIS — Z09 Encounter for follow-up examination after completed treatment for conditions other than malignant neoplasm: Secondary | ICD-10-CM | POA: Diagnosis not present

## 2020-06-10 DIAGNOSIS — R079 Chest pain, unspecified: Secondary | ICD-10-CM | POA: Diagnosis not present

## 2020-06-10 DIAGNOSIS — R0789 Other chest pain: Secondary | ICD-10-CM | POA: Diagnosis not present

## 2020-06-10 DIAGNOSIS — K449 Diaphragmatic hernia without obstruction or gangrene: Secondary | ICD-10-CM | POA: Diagnosis not present

## 2020-06-10 DIAGNOSIS — K3189 Other diseases of stomach and duodenum: Secondary | ICD-10-CM | POA: Diagnosis not present

## 2020-06-10 DIAGNOSIS — R112 Nausea with vomiting, unspecified: Secondary | ICD-10-CM | POA: Diagnosis not present

## 2020-06-10 DIAGNOSIS — K22719 Barrett's esophagus with dysplasia, unspecified: Secondary | ICD-10-CM | POA: Diagnosis not present

## 2020-06-10 DIAGNOSIS — J939 Pneumothorax, unspecified: Secondary | ICD-10-CM | POA: Diagnosis not present

## 2020-06-10 DIAGNOSIS — R1012 Left upper quadrant pain: Secondary | ICD-10-CM | POA: Diagnosis not present

## 2020-06-10 DIAGNOSIS — Z7982 Long term (current) use of aspirin: Secondary | ICD-10-CM | POA: Diagnosis not present

## 2020-06-10 DIAGNOSIS — I700 Atherosclerosis of aorta: Secondary | ICD-10-CM | POA: Diagnosis not present

## 2020-06-10 DIAGNOSIS — Z79899 Other long term (current) drug therapy: Secondary | ICD-10-CM | POA: Diagnosis not present

## 2020-06-10 DIAGNOSIS — Z48813 Encounter for surgical aftercare following surgery on the respiratory system: Secondary | ICD-10-CM | POA: Diagnosis not present

## 2020-06-12 DIAGNOSIS — Z7982 Long term (current) use of aspirin: Secondary | ICD-10-CM | POA: Diagnosis not present

## 2020-06-12 DIAGNOSIS — K3189 Other diseases of stomach and duodenum: Secondary | ICD-10-CM | POA: Diagnosis not present

## 2020-06-12 DIAGNOSIS — K22719 Barrett's esophagus with dysplasia, unspecified: Secondary | ICD-10-CM | POA: Diagnosis not present

## 2020-06-12 DIAGNOSIS — K449 Diaphragmatic hernia without obstruction or gangrene: Secondary | ICD-10-CM | POA: Diagnosis not present

## 2020-06-12 DIAGNOSIS — Z48813 Encounter for surgical aftercare following surgery on the respiratory system: Secondary | ICD-10-CM | POA: Diagnosis not present

## 2020-06-15 DIAGNOSIS — F321 Major depressive disorder, single episode, moderate: Secondary | ICD-10-CM | POA: Diagnosis not present

## 2020-06-15 DIAGNOSIS — J939 Pneumothorax, unspecified: Secondary | ICD-10-CM | POA: Diagnosis not present

## 2020-06-15 DIAGNOSIS — I700 Atherosclerosis of aorta: Secondary | ICD-10-CM | POA: Diagnosis not present

## 2020-06-17 DIAGNOSIS — Z48813 Encounter for surgical aftercare following surgery on the respiratory system: Secondary | ICD-10-CM | POA: Diagnosis not present

## 2020-06-17 DIAGNOSIS — K22719 Barrett's esophagus with dysplasia, unspecified: Secondary | ICD-10-CM | POA: Diagnosis not present

## 2020-06-17 DIAGNOSIS — K3189 Other diseases of stomach and duodenum: Secondary | ICD-10-CM | POA: Diagnosis not present

## 2020-06-17 DIAGNOSIS — Z7982 Long term (current) use of aspirin: Secondary | ICD-10-CM | POA: Diagnosis not present

## 2020-06-17 DIAGNOSIS — K449 Diaphragmatic hernia without obstruction or gangrene: Secondary | ICD-10-CM | POA: Diagnosis not present

## 2020-06-19 DIAGNOSIS — K5900 Constipation, unspecified: Secondary | ICD-10-CM | POA: Diagnosis not present

## 2020-06-19 DIAGNOSIS — S2242XD Multiple fractures of ribs, left side, subsequent encounter for fracture with routine healing: Secondary | ICD-10-CM | POA: Diagnosis not present

## 2020-06-19 DIAGNOSIS — J939 Pneumothorax, unspecified: Secondary | ICD-10-CM | POA: Diagnosis not present

## 2020-06-19 DIAGNOSIS — R1012 Left upper quadrant pain: Secondary | ICD-10-CM | POA: Diagnosis not present

## 2020-06-19 DIAGNOSIS — I700 Atherosclerosis of aorta: Secondary | ICD-10-CM | POA: Diagnosis not present

## 2020-06-19 DIAGNOSIS — K228 Other specified diseases of esophagus: Secondary | ICD-10-CM | POA: Diagnosis not present

## 2020-06-20 DIAGNOSIS — R0602 Shortness of breath: Secondary | ICD-10-CM | POA: Diagnosis not present

## 2020-06-20 DIAGNOSIS — K449 Diaphragmatic hernia without obstruction or gangrene: Secondary | ICD-10-CM | POA: Diagnosis not present

## 2020-06-25 DIAGNOSIS — K3189 Other diseases of stomach and duodenum: Secondary | ICD-10-CM | POA: Diagnosis not present

## 2020-06-25 DIAGNOSIS — Z7982 Long term (current) use of aspirin: Secondary | ICD-10-CM | POA: Diagnosis not present

## 2020-06-25 DIAGNOSIS — K22719 Barrett's esophagus with dysplasia, unspecified: Secondary | ICD-10-CM | POA: Diagnosis not present

## 2020-06-25 DIAGNOSIS — K449 Diaphragmatic hernia without obstruction or gangrene: Secondary | ICD-10-CM | POA: Diagnosis not present

## 2020-06-25 DIAGNOSIS — Z48813 Encounter for surgical aftercare following surgery on the respiratory system: Secondary | ICD-10-CM | POA: Diagnosis not present

## 2020-06-26 ENCOUNTER — Encounter (HOSPITAL_COMMUNITY): Payer: Self-pay | Admitting: Emergency Medicine

## 2020-06-26 ENCOUNTER — Emergency Department (HOSPITAL_COMMUNITY): Payer: Medicare Other

## 2020-06-26 ENCOUNTER — Observation Stay (HOSPITAL_COMMUNITY)
Admission: EM | Admit: 2020-06-26 | Discharge: 2020-06-26 | Disposition: A | Discharge status: Home / Self Care | Payer: Medicare Other | Attending: Internal Medicine | Admitting: Internal Medicine

## 2020-06-26 DIAGNOSIS — I10 Essential (primary) hypertension: Secondary | ICD-10-CM | POA: Insufficient documentation

## 2020-06-26 DIAGNOSIS — J939 Pneumothorax, unspecified: Secondary | ICD-10-CM | POA: Insufficient documentation

## 2020-06-26 DIAGNOSIS — Z20822 Contact with and (suspected) exposure to covid-19: Secondary | ICD-10-CM | POA: Insufficient documentation

## 2020-06-26 DIAGNOSIS — R112 Nausea with vomiting, unspecified: Principal | ICD-10-CM | POA: Insufficient documentation

## 2020-06-26 DIAGNOSIS — K589 Irritable bowel syndrome without diarrhea: Secondary | ICD-10-CM | POA: Insufficient documentation

## 2020-06-26 DIAGNOSIS — E785 Hyperlipidemia, unspecified: Secondary | ICD-10-CM | POA: Diagnosis not present

## 2020-06-26 DIAGNOSIS — Z9889 Other specified postprocedural states: Secondary | ICD-10-CM | POA: Diagnosis not present

## 2020-06-26 DIAGNOSIS — R634 Abnormal weight loss: Secondary | ICD-10-CM | POA: Insufficient documentation

## 2020-06-26 DIAGNOSIS — S2242XA Multiple fractures of ribs, left side, initial encounter for closed fracture: Secondary | ICD-10-CM | POA: Diagnosis not present

## 2020-06-26 DIAGNOSIS — R109 Unspecified abdominal pain: Secondary | ICD-10-CM | POA: Diagnosis not present

## 2020-06-26 DIAGNOSIS — D500 Iron deficiency anemia secondary to blood loss (chronic): Secondary | ICD-10-CM

## 2020-06-26 DIAGNOSIS — R0602 Shortness of breath: Secondary | ICD-10-CM | POA: Insufficient documentation

## 2020-06-26 DIAGNOSIS — Z7982 Long term (current) use of aspirin: Secondary | ICD-10-CM | POA: Diagnosis not present

## 2020-06-26 DIAGNOSIS — Z79899 Other long term (current) drug therapy: Secondary | ICD-10-CM | POA: Diagnosis not present

## 2020-06-26 DIAGNOSIS — D508 Other iron deficiency anemias: Secondary | ICD-10-CM | POA: Insufficient documentation

## 2020-06-26 DIAGNOSIS — R1110 Vomiting, unspecified: Secondary | ICD-10-CM | POA: Diagnosis present

## 2020-06-26 DIAGNOSIS — E43 Unspecified severe protein-calorie malnutrition: Secondary | ICD-10-CM

## 2020-06-26 DIAGNOSIS — J984 Other disorders of lung: Secondary | ICD-10-CM | POA: Diagnosis not present

## 2020-06-26 DIAGNOSIS — R1013 Epigastric pain: Secondary | ICD-10-CM | POA: Insufficient documentation

## 2020-06-26 DIAGNOSIS — K56609 Unspecified intestinal obstruction, unspecified as to partial versus complete obstruction: Secondary | ICD-10-CM | POA: Diagnosis not present

## 2020-06-26 DIAGNOSIS — I2510 Atherosclerotic heart disease of native coronary artery without angina pectoris: Secondary | ICD-10-CM | POA: Diagnosis not present

## 2020-06-26 DIAGNOSIS — I700 Atherosclerosis of aorta: Secondary | ICD-10-CM | POA: Diagnosis not present

## 2020-06-26 DIAGNOSIS — R079 Chest pain, unspecified: Secondary | ICD-10-CM | POA: Diagnosis not present

## 2020-06-26 DIAGNOSIS — E440 Moderate protein-calorie malnutrition: Secondary | ICD-10-CM

## 2020-06-26 LAB — CREATININE, SERUM
Creatinine, Ser: 0.72 mg/dL (ref 0.61–1.24)
GFR calc Af Amer: >60 mL/min (ref >60)
GFR calc non Af Amer: >60 mL/min (ref >60)

## 2020-06-26 LAB — BASIC METABOLIC PANEL
Anion gap: 8 (ref 5–15)
BUN: 16 mg/dL (ref 6–20)
CO2: 27 mmol/L (ref 22–32)
Calcium: 8.7 mg/dL — ABNORMAL LOW (ref 8.9–10.3)
Chloride: 104 mmol/L (ref 98–111)
Creatinine, Ser: 0.79 mg/dL (ref 0.61–1.24)
GFR calc Af Amer: >60 mL/min (ref >60)
GFR calc non Af Amer: >60 mL/min (ref >60)
Glucose, Bld: 102 mg/dL — ABNORMAL HIGH (ref 70–99)
Potassium: 3.7 mmol/L (ref 3.5–5.1)
Sodium: 139 mmol/L (ref 135–145)

## 2020-06-26 LAB — CBC
HCT: 39.9 % (ref 39.0–52.0)
Hemoglobin: 13.6 g/dL (ref 13.0–17.0)
MCH: 31.9 pg (ref 26.0–34.0)
MCHC: 34.1 g/dL (ref 30.0–36.0)
MCV: 93.7 fL (ref 80.0–100.0)
Platelets: 225 10*3/uL (ref 150–400)
RBC: 4.26 MIL/uL (ref 4.22–5.81)
RDW: 12.6 % (ref 11.5–15.5)
WBC: 5.8 10*3/uL (ref 4.0–10.5)
nRBC: 0 % (ref 0.0–0.2)

## 2020-06-26 LAB — HEPATIC FUNCTION PANEL
ALT: 33 U/L (ref 0–44)
AST: 21 U/L (ref 15–41)
Albumin: 3.6 g/dL (ref 3.5–5.0)
Alkaline Phosphatase: 79 U/L (ref 38–126)
Bilirubin, Direct: 0.1 mg/dL (ref 0.0–0.2)
Indirect Bilirubin: 0.6 mg/dL (ref 0.3–0.9)
Total Bilirubin: 0.7 mg/dL (ref 0.3–1.2)
Total Protein: 5.8 g/dL — ABNORMAL LOW (ref 6.5–8.1)

## 2020-06-26 LAB — TROPONIN I (HIGH SENSITIVITY)
Troponin I (High Sensitivity): 2 ng/L (ref <18)
Troponin I (High Sensitivity): 3 ng/L (ref <18)

## 2020-06-26 LAB — HIV ANTIBODY (ROUTINE TESTING W REFLEX): HIV Screen 4th Generation wRfx: NONREACTIVE

## 2020-06-26 LAB — SARS CORONAVIRUS 2 BY RT PCR (HOSPITAL ORDER, PERFORMED IN ~~LOC~~ HOSPITAL LAB): SARS Coronavirus 2: NEGATIVE

## 2020-06-26 LAB — LIPASE, BLOOD: Lipase: 38 U/L (ref 11–51)

## 2020-06-26 MED ORDER — DIPHENHYDRAMINE HCL 50 MG/ML IJ SOLN
25.0000 mg | Freq: Once | INTRAMUSCULAR | Status: AC
  Administered 2020-06-26: 25 mg via INTRAVENOUS
  Filled 2020-06-26: qty 1

## 2020-06-26 MED ORDER — SODIUM CHLORIDE 0.9 % IV SOLN: INTRAVENOUS | Status: DC

## 2020-06-26 MED ORDER — PROMETHAZINE HCL 25 MG/ML IJ SOLN
25.0000 mg | Freq: Once | INTRAMUSCULAR | Status: AC
  Administered 2020-06-26: 25 mg via INTRAVENOUS
  Filled 2020-06-26: qty 1

## 2020-06-26 MED ORDER — SODIUM CHLORIDE 0.9 % IV BOLUS
1000.0000 mL | Freq: Once | INTRAVENOUS | Status: AC
  Administered 2020-06-26: 1000 mL via INTRAVENOUS

## 2020-06-26 MED ORDER — PANTOPRAZOLE SODIUM 40 MG IV SOLR
40.0000 mg | Freq: Once | INTRAVENOUS | Status: AC
  Administered 2020-06-26: 40 mg via INTRAVENOUS
  Filled 2020-06-26: qty 40

## 2020-06-26 MED ORDER — TRAMADOL HCL 50 MG PO TABS: 50.0000 mg | ORAL_TABLET | Freq: Three times a day (TID) | ORAL | Status: DC | PRN

## 2020-06-26 MED ORDER — FENTANYL CITRATE (PF) 100 MCG/2ML IJ SOLN
50.0000 ug | Freq: Once | INTRAMUSCULAR | Status: AC
  Administered 2020-06-26: 50 ug via INTRAVENOUS
  Filled 2020-06-26: qty 2

## 2020-06-26 MED ORDER — ENOXAPARIN SODIUM 40 MG/0.4ML ~~LOC~~ SOLN: 40.0000 mg | SUBCUTANEOUS | Status: DC

## 2020-06-26 MED ORDER — MORPHINE SULFATE (PF) 2 MG/ML IV SOLN: 1.0000 mg | INTRAVENOUS | Status: DC | PRN

## 2020-06-26 MED ORDER — HALOPERIDOL LACTATE 5 MG/ML IJ SOLN
5.0000 mg | Freq: Once | INTRAMUSCULAR | Status: AC
  Administered 2020-06-26: 5 mg via INTRAVENOUS
  Filled 2020-06-26: qty 1

## 2020-06-26 MED ORDER — HEPARIN SOD (PORK) LOCK FLUSH 100 UNIT/ML IV SOLN: 500.0000 [IU] | Freq: Once | INTRAVENOUS | Status: DC

## 2020-06-26 MED ORDER — HYDROCODONE-ACETAMINOPHEN 5-325 MG PO TABS: 1.0000 | ORAL_TABLET | ORAL | Status: DC | PRN

## 2020-06-26 NOTE — Progress Notes (Signed)
Report received from [facility] in ED.

## 2020-06-26 NOTE — H&P (Addendum)
History and Physical    ROLLIN KOTOWSKI ONG:[id number] DOB: 1966/07/11 DOA: 06/26/2020  PCP: Charlott Rakes, MD   Chief Complain: Abdominal pain, nausea  HPI: Edwin Hall is a 54 y.o. male with somewhat complicated surgical history, he has a known medical history of IBS, severe protein caloric malnutrittion, iron deficiency anemia, HTN, and hyperlipidemia. Patient has had multiple abdominal surgeries in the past including appendectomy 1984, cholecystectomy 2008, ventral hernia repair 2010, gastrostomy tube placement 2016, pylorus plasty 2017, and most recently hiatal hernia surgery repair at [facility] in [place] on 03/17/2020. Patient presents back to our facility for similar complaints of intractable abdominal pain and nausea with poor p.o. intake with ongoing weight loss and worsening protein caloric malnutrition. Patient was admitted to our facility on 05/15/2020 and discharged 05/20/2020 with intractable abdominal pain nausea vomiting chest pain shortness of breath.  Patient had lengthy work-up at previous hospitalization given CT chest abdomen pelvis did show a left pneumothorax as well as multiple rib fractures dilated esophagus.  Patient did not require surgical intervention or chest tube for pneumothorax which is resolving on its own per report, EGD at that time showed long segment Barrett's esophagus gastric ulcer and was ultimately placed on Carafate and Protonix and discharged home with analgesia.  Patient has had extremely poor p.o. intake since his recurrent symptoms and has profound protein caloric malnutrition.  ED Course: In ED patient had repeat imaging showing resolving pneumothorax, ongoing rib fractures and postsurgical changes consistent with previous surgical history.  Patient's lab work is essentially unremarkable other than mild hypocalcemia and minimally elevated glucose at 102 but patient continues to fail p.o. challenge unable to tolerate p.o. safely and hospitalist was called for  IV fluids and symptom control.  Review of Systems: As per HPI nausea vomiting abdominal pain, denies diarrhea headache fevers chills chest pain or shortness of breath..   Assessment/Plan Principal Problem:   Intractable vomiting Active Problems:   Severe protein-calorie malnutrition Lily Kocher: less than 60% of standard weight) (HCC)   Pneumothorax   Unintentional weight loss   IBS (irritable bowel syndrome)   Dyslipidemia   HTN (hypertension)   Iron deficiency anemia due to dietary causes    Acute on chronic intractable abdominal pain nausea and vomiting, multifactorial Concurrent IBS, questionably in acute flare -Patient to be placed in observation for supportive care, Zofran and Phenergan -We will attempt to advance diet, initiate on clears and advance as tolerated -Minimal narcotics at this point given concern for frequent hospitalizations and elevated risk for polypharmacy (patient med rec history remarkable for gabapentin and oxycodone use) -Med rec pending review shows Reglan, Phenergan, Protonix, Linzess, Carafate -we will continue if confirmed by pharmacy -No clear indication on imaging/clinically to consult surgery or GI at this point although we will continue to monitor overnight for changes  Severe chronic protein caloric malnutrition in the setting of above -Continue clears as above, advance diet as tolerated -Dietary to reevaluate and assist with calorie counting and nutrition guidance -Patient has a right chest port placement for previous nutritional needs Estimated body mass index is 18.34 kg/m as calculated from the following:   Height as of this encounter: 5\' 8"  (1.727 m).   Weight as of this encounter: 54.7 kg.  Iron deficiency anemia, chronic -Currently appears stable, hemoglobin within normal limits  Hypertension -Pending med rec will resume home medications as indicated - only minimally elevated BP currently despite ongoing symptoms  Hyperlipidemia -Pending  med rec will resume home medications as indicated -  Follow lipid panel - none noted in the system No results found for: CHOL, TRIG, HDL, CHOLHDL, VLDL, LDLCALC, LDLDIRECT, LABVLDL   DVT prophylaxis: Lovenox Code Status: Full Family Communication: None present Status is: Observation  Dispo: The patient is from: Home              Anticipated d/c is to: Home              Anticipated d/c date is: 24 hours              Patient currently not medically stable for discharge given intractable nausea abdominal pain and poor p.o. intake with failure to thrive requiring IV fluids and nutrition until p.o. intake can be improved appropriately  Consultants:   None  Procedures:   None   Past Medical History:  Diagnosis Date  . Complication of anesthesia    nasuea and vomiting  . Food allergy   . Headache   . Hiatal hernia   . Hyperlipidemia   . IBS (irritable bowel syndrome)   . Iron deficiency anemia   . Urticaria     Past Surgical History:  Procedure Laterality Date  . ABDOMINAL SURGERY  12/2015   pylorus plasty  . APPENDECTOMY  1984  . BIOPSY  05/18/2020   Procedure: BIOPSY;  Surgeon: Kerin Salen, MD;  Location: [facility];  Service: Gastroenterology;;  . Ephriam Knuckles  . ESOPHAGOGASTRODUODENOSCOPY (EGD) WITH PROPOFOL N/A 05/18/2020   Procedure: ESOPHAGOGASTRODUODENOSCOPY (EGD) WITH PROPOFOL;  Surgeon: Kerin Salen, MD;  Location: [facility];  Service: Gastroenterology;  Laterality: N/A;  . GASTROSTOMY TUBE PLACEMENT    . HIATAL HERNIA REPAIR  07/2015  . TONSILLECTOMY  1980  . VENTRAL HERNIA REPAIR  2010     reports that he has never smoked. He has never used smokeless tobacco. He reports that he does not drink alcohol and does not use drugs.  Allergies  Allergen Reactions  . Other Anaphylaxis    All seafood   . Shellfish Allergy Anaphylaxis  . Iodine Hives  . Tetracyclines & Related Itching  . Zofran [Ondansetron Hcl] Itching and Rash    Family History    Problem Relation Age of Onset  . Parkinson's disease Father   . Heart disease Father   . Angioedema Mother   . Allergic rhinitis Neg Hx   . Asthma Neg Hx   . Eczema Neg Hx   . Immunodeficiency Neg Hx   . Urticaria Neg Hx     Prior to Admission medications   Medication Sig Start Date End Date Taking? Authorizing Provider  aspirin 81 MG EC tablet Take 81 mg by mouth daily. Swallow whole.   Yes [provider]  Cholecalciferol (VITAMIN D3 PO) Take 250 mcg by mouth daily.    Yes [provider]  Cyanocobalamin (VITAMIN B 12 PO) Take 1 capsule by mouth daily.   Yes [provider]  Ferrous Sulfate (IRON) 325 (65 Fe) MG TABS Take 325 mg by mouth daily.  05/12/20  Yes [provider]  gabapentin (NEURONTIN) 300 MG capsule Take 300 mg by mouth daily.  03/29/20  Yes [provider]  Ginkgo Biloba 40 MG TABS Take 40 mg by mouth daily.    Yes [provider]  ipratropium (ATROVENT) 0.06 % nasal spray USE TWO SPRAYS IN EACH NOSTRIL TWICE DAILY AS DIRECTED. Patient taking differently: Place 1 spray into both nostrils daily as needed (allergies).  05/22/20  Yes Marcelyn Bruins, MD  ketorolac (TORADOL) 10  MG tablet Take 10 mg by mouth every 6 (six) hours as needed for moderate pain or severe pain.  06/11/20  Yes [provider]  LINZESS 145 MCG CAPS capsule Take 145 mcg by mouth daily. 06/19/20  Yes [provider]  mirtazapine (REMERON) 15 MG tablet Take 15 mg by mouth at bedtime. 02/08/20  Yes [provider]  Multiple Vitamin (MULTIVITAMIN WITH MINERALS) TABS tablet Take 1 tablet by mouth daily.   Yes [provider]  pantoprazole (PROTONIX) 40 MG tablet Take 1 tablet (40 mg total) by mouth 2 (two) times daily. 05/20/20 06/26/20 Yes Lanae Boast, MD  promethazine (PHENERGAN) 25 MG tablet Take 1 tablet (25 mg total) by mouth every 6 (six) hours as needed for up to 7 days for nausea or vomiting. 05/20/20 06/26/20 Yes  Lanae Boast, MD  rosuvastatin (CRESTOR) 5 MG tablet Take 5 mg by mouth once a week. 05/29/20  Yes [provider]  Zinc 50 MG TABS Take 50 mg by mouth daily.    Yes [provider]  metoCLOPramide (REGLAN) 10 MG tablet Take 1 tablet (10 mg total) by mouth every 8 (eight) hours as needed for up to 15 doses for refractory nausea / vomiting (if not releived with phenergan). 05/20/20 05/27/20  Lanae Boast, MD  oxyCODONE (OXY IR/ROXICODONE) 5 MG immediate release tablet Take 1 tablet (5 mg total) by mouth every 6 (six) hours as needed for up to 10 doses for severe pain. 05/20/20   Lanae Boast, MD  sucralfate (CARAFATE) 1 g tablet Take 1 tablet (1 g total) by mouth 2 (two) times daily for 14 days. 05/20/20 06/03/20  Lanae Boast, MD  azelastine (ASTELIN) 0.1 % nasal spray 2 sprays per nostril twice a day for control of nasal drainage. Patient not taking: Reported on 05/02/2020 11/27/19 05/02/20  Marcelyn Bruins, MD    Physical Exam: Vitals:   06/26/20 1451 06/26/20 1706 06/26/20 1836  BP: (!) 133/105 (!) 144/94 (!) 144/97  Pulse: 97 84 81  Resp: (!) Temp: 98.2 F (36.8 C)  98.2 F (36.8 C)  TempSrc: Oral  Oral  SpO2: 100% 100% 100%  Weight:   54.7 kg  Height:    (1.727 m)    Constitutional: NAD, calm, comfortable Vitals:   06/26/20 1451 06/26/20 1706 06/26/20 1836  BP: (!) 133/105 (!) 144/94 (!) 144/97  Pulse: 97 84 81  Resp: (!) Temp: 98.2 F (36.8 C)  98.2 F (36.8 C)  TempSrc: Oral  Oral  SpO2: 100% 100% 100%  Weight:   54.7 kg  Height:    (1.727 m)   General:  Pleasantly resting in bed, No acute distress. HEENT:  Normocephalic atraumatic.  Sclerae nonicteric, noninjected.  Extraocular movements intact bilaterally. Neck:  Without mass or deformity.  Trachea is midline. Lungs:  Clear to auscultate bilaterally without rhonchi, wheeze, or rales. Heart:  Regular rate and rhythm.  Without murmurs, rubs, or gallops. Abdomen:  Soft,  nontender, nondistended.  Without guarding or rebound. Extremities: Without cyanosis, clubbing, edema, or obvious deformity. Vascular:  Dorsalis pedis and posterior tibial pulses palpable bilaterally. Skin:  Warm and dry, no erythema, no ulcerations.  Labs on Admission: I have personally reviewed following labs and imaging studies  CBC: Recent Labs  Lab 06/26/20 1454  WBC 5.8  HGB 13.6  HCT 39.9  MCV 93.7  PLT 225   Basic Metabolic Panel: Recent Labs  Lab 06/26/20 1454 06/26/20 1737  NA 139  --   K 3.7  --   CL 104  --   CO2 27  --   GLUCOSE 102*  --   BUN 16  --   CREATININE 0.79 0.72  CALCIUM 8.7*  --    GFR: Estimated Creatinine Clearance: 83.6 mL/min (by C-G formula based on SCr of 0.72 mg/dL). Liver Function Tests: Recent Labs  Lab 06/26/20 1546  AST 21  ALT 33  ALKPHOS 79  BILITOT 0.7  PROT 5.8*  ALBUMIN 3.6   Recent Labs  Lab 06/26/20 1546  LIPASE 38   No results for input(s): AMMONIA in the last 168 hours. Coagulation Profile: No results for input(s): INR, PROTIME in the last 168 hours. Cardiac Enzymes: No results for input(s): CKTOTAL, CKMB, CKMBINDEX, TROPONINI in the last 168 hours. BNP (last 3 results) No results for input(s): PROBNP in the last 8760 hours. HbA1C: No results for input(s): HGBA1C in the last 72 hours. CBG: No results for input(s): GLUCAP in the last 168 hours. Lipid Profile: No results for input(s): CHOL, HDL, LDLCALC, TRIG, CHOLHDL, LDLDIRECT in the last 72 hours. Thyroid Function Tests: No results for input(s): TSH, T4TOTAL, FREET4, T3FREE, THYROIDAB in the last 72 hours. Anemia Panel: No results for input(s): VITAMINB12, FOLATE, FERRITIN, TIBC, IRON, RETICCTPCT in the last 72 hours. Urine analysis:    Component Value Date/Time   COLORURINE AMBER (A) 05/16/2020 1800   APPEARANCEUR CLEAR 05/16/2020 1800   LABSPEC 1.021 05/16/2020 1800   PHURINE 5.0 05/16/2020 1800   GLUCOSEU NEGATIVE 05/16/2020 1800   HGBUR  NEGATIVE 05/16/2020 1800   BILIRUBINUR NEGATIVE 05/16/2020 1800   KETONESUR 20 (A) 05/16/2020 1800   PROTEINUR NEGATIVE 05/16/2020 1800   NITRITE NEGATIVE 05/16/2020 1800   LEUKOCYTESUR NEGATIVE 05/16/2020 1800    Radiological Exams on Admission: CT ABDOMEN PELVIS WO CONTRAST  Result Date: 06/26/2020 CLINICAL DATA:  Chest pain, bowel obstruction EXAM: CT CHEST, ABDOMEN AND PELVIS WITHOUT CONTRAST TECHNIQUE: Multidetector CT imaging of the chest, abdomen and pelvis was performed following the standard protocol without IV contrast. COMPARISON:  06/11/2020 FINDINGS: CT CHEST FINDINGS Cardiovascular: Mild coronary artery calcification. Global cardiac size within normal limits. No pericardial effusion. Central pulmonary arteries are of normal caliber. Thoracic aorta is unremarkable. Right subclavian chest port is in place with its tip at the superior cavoatrial junction. Mediastinum/Nodes: No pathologic thoracic adenopathy. Thyroid unremarkable. The esophagus is mildly patulous and demonstrates an air-fluid level suggesting either esophageal dysmotility or gastroesophageal reflux. Surgical changes of probable fundoplication are identified. Lungs/Pleura: Tiny left apical pneumothorax persists, but has decreased in size since prior examination. Partial left lower lobe resection has been performed with associated left-sided volume loss. Minimal left basilar scarring. Right lung is clear. No pneumothorax on the right. No pleural effusion. Central airways are widely patent. Musculoskeletal: Left seventh rib thoracotomy defect is identified. Subacute fractures of the left sixth and eighth ribs are noted laterally. No lytic or blastic bone lesions are seen. CT ABDOMEN PELVIS FINDINGS Hepatobiliary: Cholecystectomy has been performed. Liver unremarkable. No intra or extrahepatic biliary ductal dilation. Pancreas: Unremarkable Spleen: Unremarkable Adrenals/Urinary Tract: Adrenal glands are unremarkable. Kidneys are  normal. Bladder unremarkable. Stomach/Bowel: Surgical clips are seen in the region of the a gastric antrum. Stomach otherwise unremarkable. Small and large bowel are unremarkable. No evidence of obstruction or focal inflammation. Appendix absent. No free intraperitoneal gas or fluid. Vascular/Lymphatic: The abdominal vasculature is age-appropriate with minimal atherosclerotic calcification noted. No pathologic adenopathy within the abdomen and pelvis. Reproductive: Prostate  is unremarkable. Other: Rectum unremarkable. There is elevation of the left hemidiaphragm secondary to left-sided volume loss. Musculoskeletal: No lytic or blastic bone lesions are seen. IMPRESSION: 1. Tiny left apical pneumothorax, decreased in size since prior examination. 2. Subacute fractures of the left sixth and eighth ribs laterally. 3. Postsurgical changes of left lower lobe resection. 4. Mildly patulous esophagus with an air-fluid level suggesting either esophageal dysmotility or gastroesophageal reflux. 5. Normal examination of the small and large bowel. 6.  Aortic Atherosclerosis (ICD10-I70.0). Electronically Signed   By: Helyn Numbers MD   On: 06/26/2020 16:57   DG Chest 2 View  Result Date: 06/26/2020 CLINICAL DATA:  Chest pain, history of pneumothorax EXAM: CHEST - 2 VIEW COMPARISON:  06/19/2020 FINDINGS: Right chest wall port catheter is unchanged. There is no definite pneumothorax. Stable postsurgical changes and elevation of the left hemidiaphragm. No new consolidation or edema. Stable cardiomediastinal contours. IMPRESSION: No acute process in the chest. Electronically Signed   By: Guadlupe Spanish M.D.   On: 06/26/2020 15:41   CT Chest Wo Contrast  Result Date: 06/26/2020 CLINICAL DATA:  Chest pain, bowel obstruction EXAM: CT CHEST, ABDOMEN AND PELVIS WITHOUT CONTRAST TECHNIQUE: Multidetector CT imaging of the chest, abdomen and pelvis was performed following the standard protocol without IV contrast. COMPARISON:   06/11/2020 FINDINGS: CT CHEST FINDINGS Cardiovascular: Mild coronary artery calcification. Global cardiac size within normal limits. No pericardial effusion. Central pulmonary arteries are of normal caliber. Thoracic aorta is unremarkable. Right subclavian chest port is in place with its tip at the superior cavoatrial junction. Mediastinum/Nodes: No pathologic thoracic adenopathy. Thyroid unremarkable. The esophagus is mildly patulous and demonstrates an air-fluid level suggesting either esophageal dysmotility or gastroesophageal reflux. Surgical changes of probable fundoplication are identified. Lungs/Pleura: Tiny left apical pneumothorax persists, but has decreased in size since prior examination. Partial left lower lobe resection has been performed with associated left-sided volume loss. Minimal left basilar scarring. Right lung is clear. No pneumothorax on the right. No pleural effusion. Central airways are widely patent. Musculoskeletal: Left seventh rib thoracotomy defect is identified. Subacute fractures of the left sixth and eighth ribs are noted laterally. No lytic or blastic bone lesions are seen. CT ABDOMEN PELVIS FINDINGS Hepatobiliary: Cholecystectomy has been performed. Liver unremarkable. No intra or extrahepatic biliary ductal dilation. Pancreas: Unremarkable Spleen: Unremarkable Adrenals/Urinary Tract: Adrenal glands are unremarkable. Kidneys are normal. Bladder unremarkable. Stomach/Bowel: Surgical clips are seen in the region of the a gastric antrum. Stomach otherwise unremarkable. Small and large bowel are unremarkable. No evidence of obstruction or focal inflammation. Appendix absent. No free intraperitoneal gas or fluid. Vascular/Lymphatic: The abdominal vasculature is age-appropriate with minimal atherosclerotic calcification noted. No pathologic adenopathy within the abdomen and pelvis. Reproductive: Prostate is unremarkable. Other: Rectum unremarkable. There is elevation of the left  hemidiaphragm secondary to left-sided volume loss. Musculoskeletal: No lytic or blastic bone lesions are seen. IMPRESSION: 1. Tiny left apical pneumothorax, decreased in size since prior examination. 2. Subacute fractures of the left sixth and eighth ribs laterally. 3. Postsurgical changes of left lower lobe resection. 4. Mildly patulous esophagus with an air-fluid level suggesting either esophageal dysmotility or gastroesophageal reflux. 5. Normal examination of the small and large bowel. 6.  Aortic Atherosclerosis (ICD10-I70.0). Electronically Signed   By: Helyn Numbers MD   On: 06/26/2020 16:57    Azucena Fallen DO [facility] For contact please use secure messenger on Epic  If 7PM-7AM, please contact night-coverage located on [email or web address]   06/26/2020, 6:47 PM

## 2020-06-26 NOTE — ED Notes (Signed)
Report called to floor

## 2020-06-26 NOTE — ED Triage Notes (Addendum)
Pt reports pain in chest starting 2 days ago. Hx of pneumothorax. Breath sounds absent on left side.

## 2020-06-26 NOTE — ED Provider Notes (Signed)
[facility]-EMERGENCY DEPT Provider Note   CSN: [id number] Arrival date & time: 06/26/20  1442     History Chief Complaint  Patient presents with  . Chest Pain  . Shortness of Breath    Edwin Hall is a 54 y.o. male.  The history is provided by the patient.  Chest Pain Pain location:  Epigastric Pain quality: aching   Pain radiates to:  Does not radiate Pain severity:  Mild Onset quality:  Gradual Timing:  Constant Progression:  Worsening Chronicity:  Recurrent Context: eating and at rest   Relieved by:  Nothing Worsened by:  Nothing Associated symptoms: abdominal pain, nausea, shortness of breath and vomiting   Associated symptoms: no back pain, no cough, no fever and no palpitations   Shortness of Breath Associated symptoms: abdominal pain, chest pain and vomiting   Associated symptoms: no cough, no ear pain, no fever, no rash and no sore throat        Past Medical History:  Diagnosis Date  . Complication of anesthesia    nasuea and vomiting  . Food allergy   . Headache   . Hiatal hernia   . Hyperlipidemia   . IBS (irritable bowel syndrome)   . Iron deficiency anemia   . Urticaria     Patient Active Problem List   Diagnosis Date Noted  . Pneumothorax 05/15/2020  . Nausea & vomiting 05/15/2020  . Unintentional weight loss 05/15/2020    Past Surgical History:  Procedure Laterality Date  . ABDOMINAL SURGERY  12/2015   pylorus plasty  . APPENDECTOMY  1984  . BIOPSY  05/18/2020   Procedure: BIOPSY;  Surgeon: Kerin Salen, MD;  Location: [facility];  Service: Gastroenterology;;  . Ephriam Knuckles  . ESOPHAGOGASTRODUODENOSCOPY (EGD) WITH PROPOFOL N/A 05/18/2020   Procedure: ESOPHAGOGASTRODUODENOSCOPY (EGD) WITH PROPOFOL;  Surgeon: Kerin Salen, MD;  Location: [facility];  Service: Gastroenterology;  Laterality: N/A;  . GASTROSTOMY TUBE PLACEMENT    . HIATAL HERNIA REPAIR  07/2015  . TONSILLECTOMY  1980  . VENTRAL HERNIA  REPAIR  2010       Family History  Problem Relation Age of Onset  . Parkinson's disease Father   . Heart disease Father   . Angioedema Mother   . Allergic rhinitis Neg Hx   . Asthma Neg Hx   . Eczema Neg Hx   . Immunodeficiency Neg Hx   . Urticaria Neg Hx     Social History   Tobacco Use  . Smoking status: Never Smoker  . Smokeless tobacco: Never Used  Vaping Use  . Vaping Use: Never used  Substance Use Topics  . Alcohol use: No  . Drug use: No    Home Medications Prior to Admission medications   Medication Sig Start Date End Date Taking? Authorizing Provider  aspirin 81 MG EC tablet Take 81 mg by mouth daily. Swallow whole.   Yes [provider]  Cholecalciferol (VITAMIN D3 PO) Take 250 mcg by mouth daily.    Yes [provider]  Cyanocobalamin (VITAMIN B 12 PO) Take 1 capsule by mouth daily.   Yes [provider]  Ferrous Sulfate (IRON) 325 (65 Fe) MG TABS Take 325 mg by mouth daily.  05/12/20  Yes [provider]  gabapentin (NEURONTIN) 300 MG capsule Take 300 mg by mouth daily.  03/29/20  Yes [provider]  Ginkgo Biloba 40 MG TABS Take 40 mg by mouth daily.    Yes [provider]  ipratropium (ATROVENT) 0.06 % nasal spray USE TWO SPRAYS IN EACH NOSTRIL TWICE DAILY AS DIRECTED. Patient taking differently: Place 1 spray into both nostrils daily as needed (allergies).  05/22/20  Yes Padgett, Pilar GrammesShaylar Patricia, MD  ketorolac (TORADOL) 10 MG tablet Take 10 mg by mouth every 6 (six) hours as needed for moderate pain or severe pain.  06/11/20  Yes [provider]  LINZESS 145 MCG CAPS capsule Take 145 mcg by mouth daily. 06/19/20  Yes [provider]  mirtazapine (REMERON) 15 MG tablet Take 15 mg by mouth at bedtime. 02/08/20  Yes [provider]  Multiple Vitamin (MULTIVITAMIN WITH MINERALS) TABS tablet Take 1 tablet by mouth daily.   Yes [provider]  pantoprazole (PROTONIX) 40 MG tablet  Take 1 tablet (40 mg total) by mouth 2 (two) times daily. 05/20/20 06/26/20 Yes Lanae BoastKc, Ramesh, MD  promethazine (PHENERGAN) 25 MG tablet Take 1 tablet (25 mg total) by mouth every 6 (six) hours as needed for up to 7 days for nausea or vomiting. 05/20/20 06/26/20 Yes Lanae BoastKc, Ramesh, MD  rosuvastatin (CRESTOR) 5 MG tablet Take 5 mg by mouth once a week. 05/29/20  Yes [provider]  Zinc 50 MG TABS Take 50 mg by mouth daily.    Yes [provider]  metoCLOPramide (REGLAN) 10 MG tablet Take 1 tablet (10 mg total) by mouth every 8 (eight) hours as needed for up to 15 doses for refractory nausea / vomiting (if not releived with phenergan). 05/20/20 05/27/20  Lanae BoastKc, Ramesh, MD  oxyCODONE (OXY IR/ROXICODONE) 5 MG immediate release tablet Take 1 tablet (5 mg total) by mouth every 6 (six) hours as needed for up to 10 doses for severe pain. 05/20/20   Lanae BoastKc, Ramesh, MD  sucralfate (CARAFATE) 1 g tablet Take 1 tablet (1 g total) by mouth 2 (two) times daily for 14 days. 05/20/20 06/03/20  Lanae BoastKc, Ramesh, MD  azelastine (ASTELIN) 0.1 % nasal spray 2 sprays per nostril twice a day for control of nasal drainage. Patient not taking: Reported on 05/02/2020 11/27/19 05/02/20  Marcelyn BruinsPadgett, Shaylar Patricia, MD    Allergies    Other, Shellfish allergy, Iodine, Tetracyclines & related, and Zofran [ondansetron hcl]  Review of Systems   Review of Systems  Constitutional: Negative for chills and fever.  HENT: Negative for ear pain and sore throat.   Eyes: Negative for pain and visual disturbance.  Respiratory: Positive for shortness of breath. Negative for cough.   Cardiovascular: Positive for chest pain. Negative for palpitations.  Gastrointestinal: Positive for abdominal pain, nausea and vomiting.  Genitourinary: Negative for dysuria and hematuria.  Musculoskeletal: Negative for arthralgias and back pain.  Skin: Negative for color change and rash.  Neurological: Negative for seizures and syncope.  All other systems reviewed and  are negative.   Physical Exam Updated Vital Signs BP (!) 144/94 (BP Location: Left Arm)   Pulse 84   Temp 98.2 F (36.8 C) (Oral)   Resp 16   SpO2 100%   Physical Exam Vitals and nursing note reviewed.  Constitutional:      General: He is not in acute distress.    Appearance: He is well-developed. He is ill-appearing.  HENT:     Head: Normocephalic and atraumatic.     Comments: Dry mucous membranes Eyes:     Conjunctiva/sclera: Conjunctivae normal.  Cardiovascular:     Rate and Rhythm: Normal rate and regular rhythm.     Heart sounds: Normal heart sounds. No murmur heard.  Pulmonary:     Effort: Pulmonary effort is normal. No respiratory distress.     Breath sounds: Normal breath sounds. No decreased breath sounds, wheezing, rhonchi or rales.  Abdominal:     Palpations: Abdomen is soft.     Tenderness: There is abdominal tenderness.  Musculoskeletal:     Cervical back: Neck supple.     Right lower leg: No edema.     Left lower leg: No edema.  Skin:    General: Skin is warm and dry.     Capillary Refill: Capillary refill takes less than 2 seconds.  Neurological:     Mental Status: He is alert.     ED Results / Procedures / Treatments   Labs (all labs ordered are listed, but only abnormal results are displayed) Labs Reviewed  BASIC METABOLIC PANEL - Abnormal; Notable for the following components:      Result Value   Glucose, Bld 102 (*)    Calcium 8.7 (*)    All other components within normal limits  SARS CORONAVIRUS 2 BY RT PCR (HOSPITAL ORDER, PERFORMED IN [facility])  CBC  HEPATIC FUNCTION PANEL  LIPASE, BLOOD  URINALYSIS, ROUTINE W REFLEX MICROSCOPIC  TROPONIN I (HIGH SENSITIVITY)  TROPONIN I (HIGH SENSITIVITY)    EKG EKG Interpretation  Date/Time:  Thursday June 26 2020 14:59:43 EDT Ventricular Rate:  99 PR Interval:    QRS Duration: 83 QT Interval:  336 QTC Calculation: 432 R Axis:   9 Text Interpretation: Sinus rhythm Right  atrial enlargement 12 Lead; Mason-Likar Confirmed by Virgina Norfolk [phone]) on 06/26/2020 3:45:15 PM   Radiology CT ABDOMEN PELVIS WO CONTRAST  Result Date: 06/26/2020 CLINICAL DATA:  Chest pain, bowel obstruction EXAM: CT CHEST, ABDOMEN AND PELVIS WITHOUT CONTRAST TECHNIQUE: Multidetector CT imaging of the chest, abdomen and pelvis was performed following the standard protocol without IV contrast. COMPARISON:  06/11/2020 FINDINGS: CT CHEST FINDINGS Cardiovascular: Mild coronary artery calcification. Global cardiac size within normal limits. No pericardial effusion. Central pulmonary arteries are of normal caliber. Thoracic aorta is unremarkable. Right subclavian chest port is in place with its tip at the superior cavoatrial junction. Mediastinum/Nodes: No pathologic thoracic adenopathy. Thyroid unremarkable. The esophagus is mildly patulous and demonstrates an air-fluid level suggesting either esophageal dysmotility or gastroesophageal reflux. Surgical changes of probable fundoplication are identified. Lungs/Pleura: Tiny left apical pneumothorax persists, but has decreased in size since prior examination. Partial left lower lobe resection has been performed with associated left-sided volume loss. Minimal left basilar scarring. Right lung is clear. No pneumothorax on the right. No pleural effusion. Central airways are widely patent. Musculoskeletal: Left seventh rib thoracotomy defect is identified. Subacute fractures of the left sixth and eighth ribs are noted laterally. No lytic or blastic bone lesions are seen. CT ABDOMEN PELVIS FINDINGS Hepatobiliary: Cholecystectomy has been performed. Liver unremarkable. No intra or extrahepatic biliary ductal dilation. Pancreas: Unremarkable Spleen: Unremarkable Adrenals/Urinary Tract: Adrenal glands are unremarkable. Kidneys are normal. Bladder unremarkable. Stomach/Bowel: Surgical clips are seen in the region of the a gastric antrum. Stomach otherwise unremarkable.  Small and large bowel are unremarkable. No evidence of obstruction or focal inflammation. Appendix absent. No free intraperitoneal gas or fluid. Vascular/Lymphatic: The abdominal vasculature is age-appropriate with minimal atherosclerotic calcification noted. No pathologic adenopathy within the abdomen and pelvis. Reproductive: Prostate is unremarkable. Other: Rectum unremarkable. There is elevation of the left hemidiaphragm secondary to left-sided volume loss. Musculoskeletal: No lytic or blastic bone lesions are seen. IMPRESSION: 1. Tiny left apical pneumothorax,  decreased in size since prior examination. 2. Subacute fractures of the left sixth and eighth ribs laterally. 3. Postsurgical changes of left lower lobe resection. 4. Mildly patulous esophagus with an air-fluid level suggesting either esophageal dysmotility or gastroesophageal reflux. 5. Normal examination of the small and large bowel. 6.  Aortic Atherosclerosis (ICD10-I70.0). Electronically Signed   By: Helyn Numbers MD   On: 06/26/2020 16:57   DG Chest 2 View  Result Date: 06/26/2020 CLINICAL DATA:  Chest pain, history of pneumothorax EXAM: CHEST - 2 VIEW COMPARISON:  06/19/2020 FINDINGS: Right chest wall port catheter is unchanged. There is no definite pneumothorax. Stable postsurgical changes and elevation of the left hemidiaphragm. No new consolidation or edema. Stable cardiomediastinal contours. IMPRESSION: No acute process in the chest. Electronically Signed   By: Guadlupe Spanish M.D.   On: 06/26/2020 15:41   CT Chest Wo Contrast  Result Date: 06/26/2020 CLINICAL DATA:  Chest pain, bowel obstruction EXAM: CT CHEST, ABDOMEN AND PELVIS WITHOUT CONTRAST TECHNIQUE: Multidetector CT imaging of the chest, abdomen and pelvis was performed following the standard protocol without IV contrast. COMPARISON:  06/11/2020 FINDINGS: CT CHEST FINDINGS Cardiovascular: Mild coronary artery calcification. Global cardiac size within normal limits. No  pericardial effusion. Central pulmonary arteries are of normal caliber. Thoracic aorta is unremarkable. Right subclavian chest port is in place with its tip at the superior cavoatrial junction. Mediastinum/Nodes: No pathologic thoracic adenopathy. Thyroid unremarkable. The esophagus is mildly patulous and demonstrates an air-fluid level suggesting either esophageal dysmotility or gastroesophageal reflux. Surgical changes of probable fundoplication are identified. Lungs/Pleura: Tiny left apical pneumothorax persists, but has decreased in size since prior examination. Partial left lower lobe resection has been performed with associated left-sided volume loss. Minimal left basilar scarring. Right lung is clear. No pneumothorax on the right. No pleural effusion. Central airways are widely patent. Musculoskeletal: Left seventh rib thoracotomy defect is identified. Subacute fractures of the left sixth and eighth ribs are noted laterally. No lytic or blastic bone lesions are seen. CT ABDOMEN PELVIS FINDINGS Hepatobiliary: Cholecystectomy has been performed. Liver unremarkable. No intra or extrahepatic biliary ductal dilation. Pancreas: Unremarkable Spleen: Unremarkable Adrenals/Urinary Tract: Adrenal glands are unremarkable. Kidneys are normal. Bladder unremarkable. Stomach/Bowel: Surgical clips are seen in the region of the a gastric antrum. Stomach otherwise unremarkable. Small and large bowel are unremarkable. No evidence of obstruction or focal inflammation. Appendix absent. No free intraperitoneal gas or fluid. Vascular/Lymphatic: The abdominal vasculature is age-appropriate with minimal atherosclerotic calcification noted. No pathologic adenopathy within the abdomen and pelvis. Reproductive: Prostate is unremarkable. Other: Rectum unremarkable. There is elevation of the left hemidiaphragm secondary to left-sided volume loss. Musculoskeletal: No lytic or blastic bone lesions are seen. IMPRESSION: 1. Tiny left apical  pneumothorax, decreased in size since prior examination. 2. Subacute fractures of the left sixth and eighth ribs laterally. 3. Postsurgical changes of left lower lobe resection. 4. Mildly patulous esophagus with an air-fluid level suggesting either esophageal dysmotility or gastroesophageal reflux. 5. Normal examination of the small and large bowel. 6.  Aortic Atherosclerosis (ICD10-I70.0). Electronically Signed   By: Helyn Numbers MD   On: 06/26/2020 16:57    Procedures Procedures (including critical care time)  Medications Ordered in ED Medications  pantoprazole (PROTONIX) injection 40 mg (has no administration in time range)  promethazine (PHENERGAN) injection 25 mg (has no administration in time range)  haloperidol lactate (HALDOL) injection 5 mg (5 mg Intravenous Given 06/26/20 1649)  sodium chloride 0.9 % bolus 1,000 mL (1,000 mLs Intravenous New  Bag/Given 06/26/20 1703)  diphenhydrAMINE (BENADRYL) injection 25 mg (25 mg Intravenous Given 06/26/20 1647)  fentaNYL (SUBLIMAZE) injection 50 mcg (50 mcg Intravenous Given 06/26/20 1709)    ED Course  I have reviewed the triage vital signs and the nursing notes.  Pertinent labs & imaging results that were available during my care of the patient were reviewed by me and considered in my medical decision making (see chart for details).    MDM Rules/Calculators/A&P                          Edwin Hall is a 54 year old male with history of hiatal hernia status post repair, IBS who presents to the ED with abdominal pain, chest pain.  Patient with worsening symptoms after surgical repair of hiatal hernia several weeks ago.  Was complicated by have a pneumothorax afterwards however he has felt intermittent abdominal pain with nausea and vomiting since.  Has not been able to eat or drink anything for the last 3 days.  Having mostly epigastric abdominal pain.  Chest x-ray shows no pneumothorax.  No significant anemia, electrolyte abnormality, kidney  injury.  Troponin normal.  EKG shows sinus rhythm.  CT scan of the chest, abdomen, pelvis was ordered to evaluate for infectious process including bowel obstruction.   Overall CT imaging showed tiny left apical pneumothorax which is actually decreased in size since his prior CT scan.  This is overall improved.  He does however have air-fluid level within the esophagus suggesting either had some dysmotility or reflux which I think is more why patient is having severe nausea and vomiting.  Despite Haldol, Benadryl, fentanyl, fluids still very symptomatic.  Will admit for further nausea and vomiting control.  We will now give some Phenergan Protonix as well.  This chart was dictated using voice recognition software.  Despite best efforts to proofread,  errors can occur which can change the documentation meaning.    Final Clinical Impression(s) / ED Diagnoses Final diagnoses:  Nausea and vomiting, intractability of vomiting not specified, unspecified vomiting type  Abdominal pain, unspecified abdominal location    Rx / DC Orders ED Discharge Orders    None       Virgina Norfolk, DO 06/26/20 1725

## 2020-06-26 NOTE — Progress Notes (Signed)
Patient arrived on floor and requested to be discharged. Per pt he has an elderly mother who has dementia and the person staying with her had to leave. Dr. Natale Milch notified. Patient signed out AMA, risks reviewed with pt and pt verbalized an understanding of leaving. PAC was de-accessed.

## 2020-06-27 NOTE — Discharge Summary (Signed)
Discharge Summary    Edwin Hall ZOX:[id number] DOB: 05/27/1966 DOA: 06/26/2020  PCP: Edwin Rakes, MD   Chief Complain: Abdominal pain, nausea  HPI: Edwin Hall is a 54 y.o. male with somewhat complicated surgical history, he has a known medical history of IBS, severe protein caloric malnutrittion, iron deficiency anemia, HTN, and hyperlipidemia. Patient has had multiple abdominal surgeries in the past including appendectomy 1984, cholecystectomy 2008, ventral hernia repair 2010, gastrostomy tube placement 2016, pylorus plasty 2017, and most recently hiatal hernia surgery repair at [facility] in [place] on 03/17/2020. Patient presents back to our facility for similar complaints of intractable abdominal pain and nausea with poor p.o. intake with ongoing weight loss and worsening protein caloric malnutrition. Patient was admitted to our facility on 05/15/2020 and discharged 05/20/2020 with intractable abdominal pain nausea vomiting chest pain shortness of breath.  Patient had lengthy work-up at previous hospitalization given CT chest abdomen pelvis did show a left pneumothorax as well as multiple rib fractures dilated esophagus.  Patient did not require surgical intervention or chest tube for pneumothorax which is resolving on its own per report, EGD at that time showed long segment Barrett's esophagus gastric ulcer and was ultimately placed on Carafate and Protonix and discharged home with analgesia.  Patient has had extremely poor p.o. intake since his recurrent symptoms and has profound protein caloric malnutrition. In ED patient had repeat imaging showing resolving pneumothorax, ongoing rib fractures and postsurgical changes consistent with previous surgical history.  Patient's lab work is essentially unremarkable other than mild hypocalcemia and minimally elevated glucose at 102 but patient continues to fail p.o. challenge unable to tolerate p.o. safely and hospitalist was called for IV fluids and  symptom control.  Patient left AMA overnight on 06/26/20   Assessment/Plan Principal Problem:   Intractable vomiting Active Problems:   Severe protein-calorie malnutrition Edwin Hall: less than 60% of standard weight) (HCC)   Pneumothorax   Unintentional weight loss   IBS (irritable bowel syndrome)   Dyslipidemia   HTN (hypertension)   Iron deficiency anemia due to dietary causes    Acute on chronic intractable abdominal pain nausea and vomiting, multifactorial Concurrent IBS, questionably in acute flare -Patient to be placed in observation for supportive care, Zofran and Phenergan -We will attempt to advance diet, initiate on clears and advance as tolerated -Minimal narcotics at this point given concern for frequent hospitalizations and elevated risk for polypharmacy (patient med rec history remarkable for gabapentin and oxycodone use) -Med rec pending review shows Reglan, Phenergan, Protonix, Linzess, Carafate -we will continue if confirmed by pharmacy -No clear indication on imaging/clinically to consult surgery or GI at this point although we will continue to monitor overnight for changes  Severe chronic protein caloric malnutrition in the setting of above -Continue clears as above, advance diet as tolerated -Dietary to reevaluate and assist with calorie counting and nutrition guidance -Patient has a right chest port placement for previous nutritional needs Estimated body mass index is 18.34 kg/m as calculated from the following:   Height as of this encounter:  (1.727 m).   Weight as of this encounter: 54.7 kg.  Iron deficiency anemia, chronic -Currently appears stable, hemoglobin within normal limits  Hypertension -Pending med rec will resume home medications as indicated - only minimally elevated BP currently despite ongoing symptoms  Hyperlipidemia -Pending med rec will resume home medications as indicated - Follow lipid panel - none noted in the system No results  found for: CHOL, TRIG, HDL, CHOLHDL, VLDL, LDLCALC,  LDLDIRECT, LABVLDL   DVT prophylaxis: Lovenox Code Status: Full Family Communication: None present Status is: Observation  Dispo: The patient is from: Home              Anticipated d/c is to: Home              Anticipated d/c date is: 24 hours              Patient currently not medically stable for discharge given intractable nausea abdominal pain and poor p.o. intake with failure to thrive requiring IV fluids and nutrition until p.o. intake can be improved appropriately  Consultants:   None  Procedures:   None   Past Medical History:  Diagnosis Date  . Complication of anesthesia    nasuea and vomiting  . Food allergy   . Headache   . Hiatal hernia   . Hyperlipidemia   . IBS (irritable bowel syndrome)   . Iron deficiency anemia   . Urticaria     Past Surgical History:  Procedure Laterality Date  . ABDOMINAL SURGERY  12/2015   pylorus plasty  . APPENDECTOMY  1984  . BIOPSY  05/18/2020   Procedure: BIOPSY;  Surgeon: Edwin Hall, Arya, MD;  Location: [facility];  Service: Gastroenterology;;  . Edwin KnucklesHOLECYSTECTOMY  2008  . ESOPHAGOGASTRODUODENOSCOPY (EGD) WITH PROPOFOL N/A 05/18/2020   Procedure: ESOPHAGOGASTRODUODENOSCOPY (EGD) WITH PROPOFOL;  Surgeon: Edwin Hall, Arya, MD;  Location: [facility];  Service: Gastroenterology;  Laterality: N/A;  . GASTROSTOMY TUBE PLACEMENT    . HIATAL HERNIA REPAIR  07/2015  . TONSILLECTOMY  1980  . VENTRAL HERNIA REPAIR  2010     reports that he has never smoked. He has never used smokeless tobacco. He reports that he does not drink alcohol and does not use drugs.  Allergies  Allergen Reactions  . Other Anaphylaxis    All seafood   . Shellfish Allergy Anaphylaxis  . Iodine Hives  . Tetracyclines & Related Itching  . Zofran [Ondansetron Hcl] Itching and Rash    Family History  Problem Relation Age of Onset  . Parkinson's disease Father   . Heart disease Father   . Angioedema Mother     . Allergic rhinitis Neg Hx   . Asthma Neg Hx   . Eczema Neg Hx   . Immunodeficiency Neg Hx   . Urticaria Neg Hx     Prior to Admission medications   Medication Sig Start Date End Date Taking? Authorizing Provider  aspirin 81 MG EC tablet Take 81 mg by mouth daily. Swallow whole.   Yes [provider]  Cholecalciferol (VITAMIN D3 PO) Take 250 mcg by mouth daily.    Yes [provider]  Cyanocobalamin (VITAMIN B 12 PO) Take 1 capsule by mouth daily.   Yes [provider]  Ferrous Sulfate (IRON) 325 (65 Fe) MG TABS Take 325 mg by mouth daily.  05/12/20  Yes [provider]  gabapentin (NEURONTIN) 300 MG capsule Take 300 mg by mouth daily.  03/29/20  Yes [provider]  Ginkgo Biloba 40 MG TABS Take 40 mg by mouth daily.    Yes [provider]  ipratropium (ATROVENT) 0.06 % nasal spray USE TWO SPRAYS IN EACH NOSTRIL TWICE DAILY AS DIRECTED. Patient taking differently: Place 1 spray into both nostrils daily as needed (allergies).  05/22/20  Yes Padgett, Pilar GrammesShaylar Patricia, MD  ketorolac (TORADOL) 10 MG tablet Take 10 mg by mouth every 6 (six) hours as needed for moderate pain or severe pain.  06/11/20  Yes [provider]  LINZESS 145 MCG CAPS capsule Take 145 mcg by mouth daily. 06/19/20  Yes [provider]  mirtazapine (REMERON) 15 MG tablet Take 15 mg by mouth at bedtime. 02/08/20  Yes [provider]  Multiple Vitamin (MULTIVITAMIN WITH MINERALS) TABS tablet Take 1 tablet by mouth daily.   Yes [provider]  pantoprazole (PROTONIX) 40 MG tablet Take 1 tablet (40 mg total) by mouth 2 (two) times daily. 05/20/20 06/26/20 Yes Lanae Boast, MD  promethazine (PHENERGAN) 25 MG tablet Take 1 tablet (25 mg total) by mouth every 6 (six) hours as needed for up to 7 days for nausea or vomiting. 05/20/20 06/26/20 Yes Lanae Boast, MD  rosuvastatin (CRESTOR) 5 MG tablet Take 5 mg by mouth once a week. 05/29/20  Yes [provider]  Zinc 50 MG TABS Take 50 mg by mouth daily.    Yes [provider]  metoCLOPramide (REGLAN) 10 MG tablet Take 1 tablet (10 mg total) by mouth every 8 (eight) hours as needed for up to 15 doses for refractory nausea / vomiting (if not releived with phenergan). 05/20/20 05/27/20  Lanae Boast, MD  oxyCODONE (OXY IR/ROXICODONE) 5 MG immediate release tablet Take 1 tablet (5 mg total) by mouth every 6 (six) hours as needed for up to 10 doses for severe pain. 05/20/20   Lanae Boast, MD  sucralfate (CARAFATE) 1 g tablet Take 1 tablet (1 g total) by mouth 2 (two) times daily for 14 days. 05/20/20 06/03/20  Lanae Boast, MD  azelastine (ASTELIN) 0.1 % nasal spray 2 sprays per nostril twice a day for control of nasal drainage. Patient not taking: Reported on 05/02/2020 11/27/19 05/02/20  Marcelyn Bruins, MD    Physical Exam: Vitals:   06/26/20 1451 06/26/20 1706 06/26/20 1836  BP: (!) 133/105 (!) 144/94 (!) 144/97  Pulse: 97 84 81  Resp: (!) 24 16 16   Temp: 98.2 F (36.8 C)  98.2 F (36.8 C)  TempSrc: Oral  Oral  SpO2: 100% 100% 100%  Weight:   54.7 kg  Height:   5\' 8"  (1.727 m)    Constitutional: NAD, calm, comfortable Vitals:   06/26/20 1451 06/26/20 1706 06/26/20 1836  BP: (!) 133/105 (!) 144/94 (!) 144/97  Pulse: 97 84 81  Resp: (!) 24 16 16   Temp: 98.2 F (36.8 C)  98.2 F (36.8 C)  TempSrc: Oral  Oral  SpO2: 100% 100% 100%  Weight:   54.7 kg  Height:   5\' 8"  (1.727 m)   General:  Pleasantly resting in bed, No acute distress. HEENT:  Normocephalic atraumatic.  Sclerae nonicteric, noninjected.  Extraocular movements intact bilaterally. Neck:  Without mass or deformity.  Trachea is midline. Lungs:  Clear to auscultate bilaterally without rhonchi, wheeze, or rales. Heart:  Regular rate and rhythm.  Without murmurs, rubs, or gallops. Abdomen:  Soft, nontender, nondistended.  Without guarding or rebound. Extremities: Without cyanosis, clubbing, edema, or obvious  deformity. Vascular:  Dorsalis pedis and posterior tibial pulses palpable bilaterally. Skin:  Warm and dry, no erythema, no ulcerations.  Labs on Admission: I have personally reviewed following labs and imaging studies  CBC: Recent Labs  Lab 06/26/20 1454  WBC 5.8  HGB 13.6  HCT 39.9  MCV 93.7  PLT 225   Basic Metabolic Panel: Recent Labs  Lab 06/26/20 1454 06/26/20 1737  NA 139  --   K 3.7  --   CL 104  --   CO2  27  --   GLUCOSE 102*  --   BUN 16  --   CREATININE 0.79 0.72  CALCIUM 8.7*  --    GFR: Estimated Creatinine Clearance: 83.6 mL/min (by C-G formula based on SCr of 0.72 mg/dL). Liver Function Tests: Recent Labs  Lab 06/26/20 1546  AST 21  ALT 33  ALKPHOS 79  BILITOT 0.7  PROT 5.8*  ALBUMIN 3.6   Recent Labs  Lab 06/26/20 1546  LIPASE 38   No results for input(s): AMMONIA in the last 168 hours. Coagulation Profile: No results for input(s): INR, PROTIME in the last 168 hours. Cardiac Enzymes: No results for input(s): CKTOTAL, CKMB, CKMBINDEX, TROPONINI in the last 168 hours. BNP (last 3 results) No results for input(s): PROBNP in the last 8760 hours. HbA1C: No results for input(s): HGBA1C in the last 72 hours. CBG: No results for input(s): GLUCAP in the last 168 hours. Lipid Profile: No results for input(s): CHOL, HDL, LDLCALC, TRIG, CHOLHDL, LDLDIRECT in the last 72 hours. Thyroid Function Tests: No results for input(s): TSH, T4TOTAL, FREET4, T3FREE, THYROIDAB in the last 72 hours. Anemia Panel: No results for input(s): VITAMINB12, FOLATE, FERRITIN, TIBC, IRON, RETICCTPCT in the last 72 hours. Urine analysis:    Component Value Date/Time   COLORURINE AMBER (A) 05/16/2020 1800   APPEARANCEUR CLEAR 05/16/2020 1800   LABSPEC 1.021 05/16/2020 1800   PHURINE 5.0 05/16/2020 1800   GLUCOSEU NEGATIVE 05/16/2020 1800   HGBUR NEGATIVE 05/16/2020 1800   BILIRUBINUR NEGATIVE 05/16/2020 1800   KETONESUR 20 (A) 05/16/2020 1800   PROTEINUR NEGATIVE  05/16/2020 1800   NITRITE NEGATIVE 05/16/2020 1800   LEUKOCYTESUR NEGATIVE 05/16/2020 1800    Radiological Exams on Admission: CT ABDOMEN PELVIS WO CONTRAST  Result Date: 06/26/2020 CLINICAL DATA:  Chest pain, bowel obstruction EXAM: CT CHEST, ABDOMEN AND PELVIS WITHOUT CONTRAST TECHNIQUE: Multidetector CT imaging of the chest, abdomen and pelvis was performed following the standard protocol without IV contrast. COMPARISON:  06/11/2020 FINDINGS: CT CHEST FINDINGS Cardiovascular: Mild coronary artery calcification. Global cardiac size within normal limits. No pericardial effusion. Central pulmonary arteries are of normal caliber. Thoracic aorta is unremarkable. Right subclavian chest port is in place with its tip at the superior cavoatrial junction. Mediastinum/Nodes: No pathologic thoracic adenopathy. Thyroid unremarkable. The esophagus is mildly patulous and demonstrates an air-fluid level suggesting either esophageal dysmotility or gastroesophageal reflux. Surgical changes of probable fundoplication are identified. Lungs/Pleura: Tiny left apical pneumothorax persists, but has decreased in size since prior examination. Partial left lower lobe resection has been performed with associated left-sided volume loss. Minimal left basilar scarring. Right lung is clear. No pneumothorax on the right. No pleural effusion. Central airways are widely patent. Musculoskeletal: Left seventh rib thoracotomy defect is identified. Subacute fractures of the left sixth and eighth ribs are noted laterally. No lytic or blastic bone lesions are seen. CT ABDOMEN PELVIS FINDINGS Hepatobiliary: Cholecystectomy has been performed. Liver unremarkable. No intra or extrahepatic biliary ductal dilation. Pancreas: Unremarkable Spleen: Unremarkable Adrenals/Urinary Tract: Adrenal glands are unremarkable. Kidneys are normal. Bladder unremarkable. Stomach/Bowel: Surgical clips are seen in the region of the a gastric antrum. Stomach otherwise  unremarkable. Small and large bowel are unremarkable. No evidence of obstruction or focal inflammation. Appendix absent. No free intraperitoneal gas or fluid. Vascular/Lymphatic: The abdominal vasculature is age-appropriate with minimal atherosclerotic calcification noted. No pathologic adenopathy within the abdomen and pelvis. Reproductive: Prostate is unremarkable. Other: Rectum unremarkable. There is elevation of the left hemidiaphragm secondary to left-sided volume loss. Musculoskeletal: No  lytic or blastic bone lesions are seen. IMPRESSION: 1. Tiny left apical pneumothorax, decreased in size since prior examination. 2. Subacute fractures of the left sixth and eighth ribs laterally. 3. Postsurgical changes of left lower lobe resection. 4. Mildly patulous esophagus with an air-fluid level suggesting either esophageal dysmotility or gastroesophageal reflux. 5. Normal examination of the small and large bowel. 6.  Aortic Atherosclerosis (ICD10-I70.0). Electronically Signed   By: Helyn Numbers MD   On: 06/26/2020 16:57   DG Chest 2 View  Result Date: 06/26/2020 CLINICAL DATA:  Chest pain, history of pneumothorax EXAM: CHEST - 2 VIEW COMPARISON:  06/19/2020 FINDINGS: Right chest wall port catheter is unchanged. There is no definite pneumothorax. Stable postsurgical changes and elevation of the left hemidiaphragm. No new consolidation or edema. Stable cardiomediastinal contours. IMPRESSION: No acute process in the chest. Electronically Signed   By: Guadlupe Spanish M.D.   On: 06/26/2020 15:41   CT Chest Wo Contrast  Result Date: 06/26/2020 CLINICAL DATA:  Chest pain, bowel obstruction EXAM: CT CHEST, ABDOMEN AND PELVIS WITHOUT CONTRAST TECHNIQUE: Multidetector CT imaging of the chest, abdomen and pelvis was performed following the standard protocol without IV contrast. COMPARISON:  06/11/2020 FINDINGS: CT CHEST FINDINGS Cardiovascular: Mild coronary artery calcification. Global cardiac size within normal limits.  No pericardial effusion. Central pulmonary arteries are of normal caliber. Thoracic aorta is unremarkable. Right subclavian chest port is in place with its tip at the superior cavoatrial junction. Mediastinum/Nodes: No pathologic thoracic adenopathy. Thyroid unremarkable. The esophagus is mildly patulous and demonstrates an air-fluid level suggesting either esophageal dysmotility or gastroesophageal reflux. Surgical changes of probable fundoplication are identified. Lungs/Pleura: Tiny left apical pneumothorax persists, but has decreased in size since prior examination. Partial left lower lobe resection has been performed with associated left-sided volume loss. Minimal left basilar scarring. Right lung is clear. No pneumothorax on the right. No pleural effusion. Central airways are widely patent. Musculoskeletal: Left seventh rib thoracotomy defect is identified. Subacute fractures of the left sixth and eighth ribs are noted laterally. No lytic or blastic bone lesions are seen. CT ABDOMEN PELVIS FINDINGS Hepatobiliary: Cholecystectomy has been performed. Liver unremarkable. No intra or extrahepatic biliary ductal dilation. Pancreas: Unremarkable Spleen: Unremarkable Adrenals/Urinary Tract: Adrenal glands are unremarkable. Kidneys are normal. Bladder unremarkable. Stomach/Bowel: Surgical clips are seen in the region of the a gastric antrum. Stomach otherwise unremarkable. Small and large bowel are unremarkable. No evidence of obstruction or focal inflammation. Appendix absent. No free intraperitoneal gas or fluid. Vascular/Lymphatic: The abdominal vasculature is age-appropriate with minimal atherosclerotic calcification noted. No pathologic adenopathy within the abdomen and pelvis. Reproductive: Prostate is unremarkable. Other: Rectum unremarkable. There is elevation of the left hemidiaphragm secondary to left-sided volume loss. Musculoskeletal: No lytic or blastic bone lesions are seen. IMPRESSION: 1. Tiny left  apical pneumothorax, decreased in size since prior examination. 2. Subacute fractures of the left sixth and eighth ribs laterally. 3. Postsurgical changes of left lower lobe resection. 4. Mildly patulous esophagus with an air-fluid level suggesting either esophageal dysmotility or gastroesophageal reflux. 5. Normal examination of the small and large bowel. 6.  Aortic Atherosclerosis (ICD10-I70.0). Electronically Signed   By: Helyn Numbers MD   On: 06/26/2020 16:57    Azucena Fallen DO [facility] For contact please use secure messenger on Epic  If 7PM-7AM, please contact night-coverage located on [email or web address]   06/27/2020, 2:09 PM

## 2020-06-29 DIAGNOSIS — R0989 Other specified symptoms and signs involving the circulatory and respiratory systems: Secondary | ICD-10-CM | POA: Diagnosis not present

## 2020-06-29 DIAGNOSIS — K6289 Other specified diseases of anus and rectum: Secondary | ICD-10-CM | POA: Diagnosis not present

## 2020-06-29 DIAGNOSIS — R55 Syncope and collapse: Secondary | ICD-10-CM | POA: Diagnosis not present

## 2020-06-29 DIAGNOSIS — K5641 Fecal impaction: Secondary | ICD-10-CM | POA: Diagnosis not present

## 2020-06-29 DIAGNOSIS — I700 Atherosclerosis of aorta: Secondary | ICD-10-CM | POA: Diagnosis not present

## 2020-06-29 DIAGNOSIS — I10 Essential (primary) hypertension: Secondary | ICD-10-CM | POA: Diagnosis not present

## 2020-06-29 DIAGNOSIS — I959 Hypotension, unspecified: Secondary | ICD-10-CM | POA: Diagnosis not present

## 2020-06-29 DIAGNOSIS — K6389 Other specified diseases of intestine: Secondary | ICD-10-CM | POA: Diagnosis not present

## 2020-06-29 DIAGNOSIS — K3189 Other diseases of stomach and duodenum: Secondary | ICD-10-CM | POA: Diagnosis not present

## 2020-06-29 DIAGNOSIS — R079 Chest pain, unspecified: Secondary | ICD-10-CM | POA: Diagnosis not present

## 2020-06-30 DIAGNOSIS — Z7982 Long term (current) use of aspirin: Secondary | ICD-10-CM | POA: Diagnosis not present

## 2020-06-30 DIAGNOSIS — K22719 Barrett's esophagus with dysplasia, unspecified: Secondary | ICD-10-CM | POA: Diagnosis not present

## 2020-06-30 DIAGNOSIS — Z48813 Encounter for surgical aftercare following surgery on the respiratory system: Secondary | ICD-10-CM | POA: Diagnosis not present

## 2020-06-30 DIAGNOSIS — K3189 Other diseases of stomach and duodenum: Secondary | ICD-10-CM | POA: Diagnosis not present

## 2020-06-30 DIAGNOSIS — K449 Diaphragmatic hernia without obstruction or gangrene: Secondary | ICD-10-CM | POA: Diagnosis not present

## 2020-07-01 DIAGNOSIS — R1010 Upper abdominal pain, unspecified: Secondary | ICD-10-CM | POA: Diagnosis not present

## 2020-07-01 DIAGNOSIS — Z79899 Other long term (current) drug therapy: Secondary | ICD-10-CM | POA: Diagnosis not present

## 2020-07-01 DIAGNOSIS — K6389 Other specified diseases of intestine: Secondary | ICD-10-CM | POA: Diagnosis not present

## 2020-07-01 DIAGNOSIS — Z7982 Long term (current) use of aspirin: Secondary | ICD-10-CM | POA: Diagnosis not present

## 2020-07-01 DIAGNOSIS — K529 Noninfective gastroenteritis and colitis, unspecified: Secondary | ICD-10-CM | POA: Diagnosis not present

## 2020-07-01 DIAGNOSIS — Z8719 Personal history of other diseases of the digestive system: Secondary | ICD-10-CM | POA: Diagnosis not present

## 2020-07-01 DIAGNOSIS — R195 Other fecal abnormalities: Secondary | ICD-10-CM | POA: Diagnosis not present

## 2020-07-02 DIAGNOSIS — Z48813 Encounter for surgical aftercare following surgery on the respiratory system: Secondary | ICD-10-CM | POA: Diagnosis not present

## 2020-07-02 DIAGNOSIS — K22719 Barrett's esophagus with dysplasia, unspecified: Secondary | ICD-10-CM | POA: Diagnosis not present

## 2020-07-02 DIAGNOSIS — Z7982 Long term (current) use of aspirin: Secondary | ICD-10-CM | POA: Diagnosis not present

## 2020-07-02 DIAGNOSIS — K449 Diaphragmatic hernia without obstruction or gangrene: Secondary | ICD-10-CM | POA: Diagnosis not present

## 2020-07-02 DIAGNOSIS — K3189 Other diseases of stomach and duodenum: Secondary | ICD-10-CM | POA: Diagnosis not present

## 2020-07-03 DIAGNOSIS — Z1339 Encounter for screening examination for other mental health and behavioral disorders: Secondary | ICD-10-CM | POA: Diagnosis not present

## 2020-07-03 DIAGNOSIS — R636 Underweight: Secondary | ICD-10-CM | POA: Diagnosis not present

## 2020-07-03 DIAGNOSIS — J9383 Other pneumothorax: Secondary | ICD-10-CM | POA: Diagnosis not present

## 2020-07-03 DIAGNOSIS — K5904 Chronic idiopathic constipation: Secondary | ICD-10-CM | POA: Diagnosis not present

## 2020-07-03 DIAGNOSIS — Z1331 Encounter for screening for depression: Secondary | ICD-10-CM | POA: Diagnosis not present

## 2020-07-03 DIAGNOSIS — Z139 Encounter for screening, unspecified: Secondary | ICD-10-CM | POA: Diagnosis not present

## 2020-07-03 DIAGNOSIS — Z7689 Persons encountering health services in other specified circumstances: Secondary | ICD-10-CM | POA: Diagnosis not present

## 2020-07-03 DIAGNOSIS — Z681 Body mass index (BMI) 19 or less, adult: Secondary | ICD-10-CM | POA: Diagnosis not present

## 2020-07-03 DIAGNOSIS — Z0000 Encounter for general adult medical examination without abnormal findings: Secondary | ICD-10-CM | POA: Diagnosis not present

## 2020-07-04 DIAGNOSIS — Z7982 Long term (current) use of aspirin: Secondary | ICD-10-CM | POA: Diagnosis not present

## 2020-07-04 DIAGNOSIS — K22719 Barrett's esophagus with dysplasia, unspecified: Secondary | ICD-10-CM | POA: Diagnosis not present

## 2020-07-04 DIAGNOSIS — K3189 Other diseases of stomach and duodenum: Secondary | ICD-10-CM | POA: Diagnosis not present

## 2020-07-04 DIAGNOSIS — K449 Diaphragmatic hernia without obstruction or gangrene: Secondary | ICD-10-CM | POA: Diagnosis not present

## 2020-07-04 DIAGNOSIS — Z48813 Encounter for surgical aftercare following surgery on the respiratory system: Secondary | ICD-10-CM | POA: Diagnosis not present

## 2020-07-08 DIAGNOSIS — R1310 Dysphagia, unspecified: Secondary | ICD-10-CM | POA: Diagnosis not present

## 2020-07-08 DIAGNOSIS — K921 Melena: Secondary | ICD-10-CM | POA: Diagnosis not present

## 2020-07-15 DIAGNOSIS — I700 Atherosclerosis of aorta: Secondary | ICD-10-CM | POA: Diagnosis not present

## 2020-07-15 DIAGNOSIS — F321 Major depressive disorder, single episode, moderate: Secondary | ICD-10-CM | POA: Diagnosis not present

## 2020-07-15 DIAGNOSIS — J939 Pneumothorax, unspecified: Secondary | ICD-10-CM | POA: Diagnosis not present

## 2020-07-18 DIAGNOSIS — D509 Iron deficiency anemia, unspecified: Secondary | ICD-10-CM | POA: Diagnosis not present

## 2020-07-18 DIAGNOSIS — E46 Unspecified protein-calorie malnutrition: Secondary | ICD-10-CM | POA: Diagnosis not present

## 2020-07-18 DIAGNOSIS — Z125 Encounter for screening for malignant neoplasm of prostate: Secondary | ICD-10-CM | POA: Diagnosis not present

## 2020-07-18 DIAGNOSIS — G8929 Other chronic pain: Secondary | ICD-10-CM | POA: Diagnosis not present

## 2020-07-30 ENCOUNTER — Telehealth: Payer: Self-pay | Admitting: Cardiology

## 2020-07-30 NOTE — Telephone Encounter (Signed)
Michae is calling requesting to speak with a nurse to see if Dr. Bing Matter does a certain type of procedure before having a referral sent over to be seen. Please advise.

## 2020-07-30 NOTE — Telephone Encounter (Signed)
Attempted to call patient back no answer will continue efforts.

## 2020-08-01 DIAGNOSIS — R1310 Dysphagia, unspecified: Secondary | ICD-10-CM | POA: Diagnosis not present

## 2020-08-01 DIAGNOSIS — K625 Hemorrhage of anus and rectum: Secondary | ICD-10-CM | POA: Diagnosis not present

## 2020-08-01 DIAGNOSIS — K2970 Gastritis, unspecified, without bleeding: Secondary | ICD-10-CM | POA: Diagnosis not present

## 2020-08-01 DIAGNOSIS — K921 Melena: Secondary | ICD-10-CM | POA: Diagnosis not present

## 2020-08-01 DIAGNOSIS — K2270 Barrett's esophagus without dysplasia: Secondary | ICD-10-CM | POA: Diagnosis not present

## 2020-08-05 DIAGNOSIS — R05 Cough: Secondary | ICD-10-CM | POA: Diagnosis not present

## 2020-08-05 DIAGNOSIS — J200 Acute bronchitis due to Mycoplasma pneumoniae: Secondary | ICD-10-CM | POA: Diagnosis not present

## 2020-08-05 DIAGNOSIS — R0981 Nasal congestion: Secondary | ICD-10-CM | POA: Diagnosis not present

## 2020-08-05 DIAGNOSIS — Z20828 Contact with and (suspected) exposure to other viral communicable diseases: Secondary | ICD-10-CM | POA: Diagnosis not present

## 2020-08-07 DIAGNOSIS — F321 Major depressive disorder, single episode, moderate: Secondary | ICD-10-CM | POA: Diagnosis not present

## 2020-08-07 DIAGNOSIS — R110 Nausea: Secondary | ICD-10-CM | POA: Diagnosis not present

## 2020-08-07 DIAGNOSIS — E46 Unspecified protein-calorie malnutrition: Secondary | ICD-10-CM | POA: Diagnosis not present

## 2020-08-07 DIAGNOSIS — K2270 Barrett's esophagus without dysplasia: Secondary | ICD-10-CM | POA: Diagnosis not present

## 2020-08-15 DIAGNOSIS — F321 Major depressive disorder, single episode, moderate: Secondary | ICD-10-CM | POA: Diagnosis not present

## 2020-08-15 DIAGNOSIS — K2270 Barrett's esophagus without dysplasia: Secondary | ICD-10-CM | POA: Diagnosis not present

## 2020-08-15 DIAGNOSIS — I700 Atherosclerosis of aorta: Secondary | ICD-10-CM | POA: Diagnosis not present

## 2020-08-18 NOTE — Telephone Encounter (Signed)
Called patient back he is asking if Dr. Bing Matter does pleurodesis in the hospital. I informed him not that I know of, will check with Dr. Bing Matter to be sure.

## 2020-08-21 NOTE — Telephone Encounter (Signed)
I think Dr. Irving Burton was doing this procedure

## 2020-08-21 NOTE — Telephone Encounter (Signed)
Informed patient of Dr. Vanetta Shawl note.

## 2020-08-22 DIAGNOSIS — Z452 Encounter for adjustment and management of vascular access device: Secondary | ICD-10-CM | POA: Diagnosis not present

## 2020-08-22 DIAGNOSIS — Z789 Other specified health status: Secondary | ICD-10-CM | POA: Diagnosis not present

## 2020-08-28 DIAGNOSIS — J939 Pneumothorax, unspecified: Secondary | ICD-10-CM | POA: Diagnosis not present

## 2020-08-28 DIAGNOSIS — K449 Diaphragmatic hernia without obstruction or gangrene: Secondary | ICD-10-CM | POA: Diagnosis not present

## 2020-09-01 DIAGNOSIS — J309 Allergic rhinitis, unspecified: Secondary | ICD-10-CM | POA: Diagnosis not present

## 2020-09-01 DIAGNOSIS — Z20828 Contact with and (suspected) exposure to other viral communicable diseases: Secondary | ICD-10-CM | POA: Diagnosis not present

## 2020-09-02 DIAGNOSIS — Z681 Body mass index (BMI) 19 or less, adult: Secondary | ICD-10-CM | POA: Diagnosis not present

## 2020-09-02 DIAGNOSIS — N529 Male erectile dysfunction, unspecified: Secondary | ICD-10-CM | POA: Diagnosis not present

## 2020-09-02 DIAGNOSIS — Z95828 Presence of other vascular implants and grafts: Secondary | ICD-10-CM | POA: Diagnosis not present

## 2020-09-04 DIAGNOSIS — J309 Allergic rhinitis, unspecified: Secondary | ICD-10-CM | POA: Diagnosis not present

## 2020-09-04 DIAGNOSIS — E46 Unspecified protein-calorie malnutrition: Secondary | ICD-10-CM | POA: Diagnosis not present

## 2020-09-04 DIAGNOSIS — Z6820 Body mass index (BMI) 20.0-20.9, adult: Secondary | ICD-10-CM | POA: Diagnosis not present

## 2020-09-14 DIAGNOSIS — R233 Spontaneous ecchymoses: Secondary | ICD-10-CM | POA: Diagnosis not present

## 2020-09-14 DIAGNOSIS — Z95828 Presence of other vascular implants and grafts: Secondary | ICD-10-CM | POA: Diagnosis not present

## 2020-09-14 DIAGNOSIS — S20211A Contusion of right front wall of thorax, initial encounter: Secondary | ICD-10-CM | POA: Diagnosis not present

## 2020-09-15 DIAGNOSIS — F321 Major depressive disorder, single episode, moderate: Secondary | ICD-10-CM | POA: Diagnosis not present

## 2020-09-15 DIAGNOSIS — I700 Atherosclerosis of aorta: Secondary | ICD-10-CM | POA: Diagnosis not present

## 2020-09-15 DIAGNOSIS — E46 Unspecified protein-calorie malnutrition: Secondary | ICD-10-CM | POA: Diagnosis not present

## 2020-09-15 DIAGNOSIS — K2270 Barrett's esophagus without dysplasia: Secondary | ICD-10-CM | POA: Diagnosis not present

## 2020-09-16 DIAGNOSIS — Z20828 Contact with and (suspected) exposure to other viral communicable diseases: Secondary | ICD-10-CM | POA: Diagnosis not present

## 2020-09-16 DIAGNOSIS — T85618A Breakdown (mechanical) of other specified internal prosthetic devices, implants and grafts, initial encounter: Secondary | ICD-10-CM | POA: Diagnosis not present

## 2020-09-16 DIAGNOSIS — Z452 Encounter for adjustment and management of vascular access device: Secondary | ICD-10-CM | POA: Diagnosis not present

## 2020-09-16 DIAGNOSIS — T829XXA Unspecified complication of cardiac and vascular prosthetic device, implant and graft, initial encounter: Secondary | ICD-10-CM | POA: Diagnosis not present

## 2020-09-17 DIAGNOSIS — Z7982 Long term (current) use of aspirin: Secondary | ICD-10-CM | POA: Diagnosis not present

## 2020-09-17 DIAGNOSIS — I872 Venous insufficiency (chronic) (peripheral): Secondary | ICD-10-CM | POA: Diagnosis not present

## 2020-09-17 DIAGNOSIS — Z452 Encounter for adjustment and management of vascular access device: Secondary | ICD-10-CM | POA: Diagnosis not present

## 2020-09-17 DIAGNOSIS — T82514A Breakdown (mechanical) of infusion catheter, initial encounter: Secondary | ICD-10-CM | POA: Diagnosis not present

## 2020-09-17 DIAGNOSIS — K219 Gastro-esophageal reflux disease without esophagitis: Secondary | ICD-10-CM | POA: Diagnosis not present

## 2020-09-17 DIAGNOSIS — J984 Other disorders of lung: Secondary | ICD-10-CM | POA: Diagnosis not present

## 2020-09-17 DIAGNOSIS — Z789 Other specified health status: Secondary | ICD-10-CM | POA: Diagnosis not present

## 2020-09-17 DIAGNOSIS — Z79899 Other long term (current) drug therapy: Secondary | ICD-10-CM | POA: Diagnosis not present

## 2020-09-17 DIAGNOSIS — T148XXA Other injury of unspecified body region, initial encounter: Secondary | ICD-10-CM | POA: Diagnosis not present

## 2020-09-17 DIAGNOSIS — T829XXA Unspecified complication of cardiac and vascular prosthetic device, implant and graft, initial encounter: Secondary | ICD-10-CM | POA: Diagnosis not present

## 2020-09-19 DIAGNOSIS — Z09 Encounter for follow-up examination after completed treatment for conditions other than malignant neoplasm: Secondary | ICD-10-CM | POA: Diagnosis not present

## 2020-09-19 DIAGNOSIS — Z789 Other specified health status: Secondary | ICD-10-CM | POA: Diagnosis not present

## 2020-09-19 DIAGNOSIS — Z452 Encounter for adjustment and management of vascular access device: Secondary | ICD-10-CM | POA: Diagnosis not present

## 2020-09-24 DIAGNOSIS — R112 Nausea with vomiting, unspecified: Secondary | ICD-10-CM | POA: Diagnosis not present

## 2020-09-24 DIAGNOSIS — Z881 Allergy status to other antibiotic agents status: Secondary | ICD-10-CM | POA: Diagnosis not present

## 2020-09-24 DIAGNOSIS — K222 Esophageal obstruction: Secondary | ICD-10-CM | POA: Diagnosis not present

## 2020-09-24 DIAGNOSIS — K2270 Barrett's esophagus without dysplasia: Secondary | ICD-10-CM | POA: Diagnosis present

## 2020-09-24 DIAGNOSIS — Z4682 Encounter for fitting and adjustment of non-vascular catheter: Secondary | ICD-10-CM | POA: Diagnosis not present

## 2020-09-24 DIAGNOSIS — Z9889 Other specified postprocedural states: Secondary | ICD-10-CM | POA: Diagnosis not present

## 2020-09-24 DIAGNOSIS — Z91018 Allergy to other foods: Secondary | ICD-10-CM | POA: Diagnosis not present

## 2020-09-24 DIAGNOSIS — Z888 Allergy status to other drugs, medicaments and biological substances status: Secondary | ICD-10-CM | POA: Diagnosis not present

## 2020-09-24 DIAGNOSIS — K219 Gastro-esophageal reflux disease without esophagitis: Secondary | ICD-10-CM | POA: Diagnosis present

## 2020-09-24 DIAGNOSIS — J90 Pleural effusion, not elsewhere classified: Secondary | ICD-10-CM | POA: Diagnosis not present

## 2020-09-24 DIAGNOSIS — R1310 Dysphagia, unspecified: Secondary | ICD-10-CM | POA: Diagnosis not present

## 2020-09-24 DIAGNOSIS — Z87892 Personal history of anaphylaxis: Secondary | ICD-10-CM | POA: Diagnosis not present

## 2020-09-24 DIAGNOSIS — J9383 Other pneumothorax: Secondary | ICD-10-CM | POA: Diagnosis not present

## 2020-09-24 DIAGNOSIS — R110 Nausea: Secondary | ICD-10-CM | POA: Diagnosis present

## 2020-09-24 DIAGNOSIS — G8912 Acute post-thoracotomy pain: Secondary | ICD-10-CM | POA: Diagnosis not present

## 2020-09-24 DIAGNOSIS — J939 Pneumothorax, unspecified: Secondary | ICD-10-CM | POA: Diagnosis not present

## 2020-09-24 DIAGNOSIS — G8918 Other acute postprocedural pain: Secondary | ICD-10-CM | POA: Diagnosis not present

## 2020-09-24 DIAGNOSIS — Z9049 Acquired absence of other specified parts of digestive tract: Secondary | ICD-10-CM | POA: Diagnosis not present

## 2020-09-24 DIAGNOSIS — K3184 Gastroparesis: Secondary | ICD-10-CM | POA: Diagnosis present

## 2020-09-24 DIAGNOSIS — Z01810 Encounter for preprocedural cardiovascular examination: Secondary | ICD-10-CM | POA: Diagnosis not present

## 2020-09-24 DIAGNOSIS — R911 Solitary pulmonary nodule: Secondary | ICD-10-CM | POA: Diagnosis not present

## 2020-09-24 DIAGNOSIS — Z79899 Other long term (current) drug therapy: Secondary | ICD-10-CM | POA: Diagnosis not present

## 2020-09-24 DIAGNOSIS — R0789 Other chest pain: Secondary | ICD-10-CM | POA: Diagnosis not present

## 2020-09-24 DIAGNOSIS — Z91013 Allergy to seafood: Secondary | ICD-10-CM | POA: Diagnosis not present

## 2020-09-24 DIAGNOSIS — Z823 Family history of stroke: Secondary | ICD-10-CM | POA: Diagnosis not present

## 2020-09-24 DIAGNOSIS — Z01818 Encounter for other preprocedural examination: Secondary | ICD-10-CM | POA: Diagnosis not present

## 2020-09-24 DIAGNOSIS — Z791 Long term (current) use of non-steroidal anti-inflammatories (NSAID): Secondary | ICD-10-CM | POA: Diagnosis not present

## 2020-09-28 DIAGNOSIS — J939 Pneumothorax, unspecified: Secondary | ICD-10-CM | POA: Diagnosis not present

## 2020-09-28 DIAGNOSIS — I2510 Atherosclerotic heart disease of native coronary artery without angina pectoris: Secondary | ICD-10-CM | POA: Diagnosis not present

## 2020-09-28 DIAGNOSIS — J984 Other disorders of lung: Secondary | ICD-10-CM | POA: Diagnosis not present

## 2020-09-28 DIAGNOSIS — R0602 Shortness of breath: Secondary | ICD-10-CM | POA: Diagnosis not present

## 2020-09-28 DIAGNOSIS — K224 Dyskinesia of esophagus: Secondary | ICD-10-CM | POA: Diagnosis not present

## 2020-09-28 DIAGNOSIS — Z902 Acquired absence of lung [part of]: Secondary | ICD-10-CM | POA: Diagnosis not present

## 2020-09-28 DIAGNOSIS — R042 Hemoptysis: Secondary | ICD-10-CM | POA: Diagnosis not present

## 2020-09-28 DIAGNOSIS — R21 Rash and other nonspecific skin eruption: Secondary | ICD-10-CM | POA: Diagnosis not present

## 2020-10-01 DIAGNOSIS — R071 Chest pain on breathing: Secondary | ICD-10-CM | POA: Diagnosis not present

## 2020-10-01 DIAGNOSIS — R042 Hemoptysis: Secondary | ICD-10-CM | POA: Diagnosis not present

## 2020-10-01 DIAGNOSIS — G8918 Other acute postprocedural pain: Secondary | ICD-10-CM | POA: Diagnosis not present

## 2020-10-03 ENCOUNTER — Encounter (HOSPITAL_COMMUNITY): Payer: Self-pay

## 2020-10-03 ENCOUNTER — Emergency Department (HOSPITAL_COMMUNITY): Payer: Medicare Other

## 2020-10-03 ENCOUNTER — Inpatient Hospital Stay (HOSPITAL_COMMUNITY)
Admission: EM | Admit: 2020-10-03 | Discharge: 2020-10-06 | DRG: 199 | Disposition: A | Discharge status: Home / Self Care | Payer: Medicare Other | Attending: Internal Medicine | Admitting: Internal Medicine

## 2020-10-03 ENCOUNTER — Other Ambulatory Visit: Payer: Self-pay

## 2020-10-03 DIAGNOSIS — R042 Hemoptysis: Secondary | ICD-10-CM | POA: Diagnosis not present

## 2020-10-03 DIAGNOSIS — R079 Chest pain, unspecified: Secondary | ICD-10-CM

## 2020-10-03 DIAGNOSIS — R109 Unspecified abdominal pain: Secondary | ICD-10-CM | POA: Diagnosis not present

## 2020-10-03 DIAGNOSIS — J939 Pneumothorax, unspecified: Secondary | ICD-10-CM | POA: Diagnosis not present

## 2020-10-03 DIAGNOSIS — Z681 Body mass index (BMI) 19 or less, adult: Secondary | ICD-10-CM

## 2020-10-03 DIAGNOSIS — E785 Hyperlipidemia, unspecified: Secondary | ICD-10-CM | POA: Diagnosis present

## 2020-10-03 DIAGNOSIS — I700 Atherosclerosis of aorta: Secondary | ICD-10-CM | POA: Diagnosis not present

## 2020-10-03 DIAGNOSIS — J984 Other disorders of lung: Secondary | ICD-10-CM | POA: Diagnosis not present

## 2020-10-03 DIAGNOSIS — Z91041 Radiographic dye allergy status: Secondary | ICD-10-CM

## 2020-10-03 DIAGNOSIS — R0602 Shortness of breath: Secondary | ICD-10-CM | POA: Diagnosis present

## 2020-10-03 DIAGNOSIS — Z888 Allergy status to other drugs, medicaments and biological substances status: Secondary | ICD-10-CM

## 2020-10-03 DIAGNOSIS — K589 Irritable bowel syndrome without diarrhea: Secondary | ICD-10-CM | POA: Diagnosis present

## 2020-10-03 DIAGNOSIS — Z931 Gastrostomy status: Secondary | ICD-10-CM

## 2020-10-03 DIAGNOSIS — Z20822 Contact with and (suspected) exposure to covid-19: Secondary | ICD-10-CM | POA: Diagnosis present

## 2020-10-03 DIAGNOSIS — K449 Diaphragmatic hernia without obstruction or gangrene: Secondary | ICD-10-CM | POA: Diagnosis not present

## 2020-10-03 DIAGNOSIS — Z9889 Other specified postprocedural states: Secondary | ICD-10-CM | POA: Diagnosis not present

## 2020-10-03 DIAGNOSIS — E43 Unspecified severe protein-calorie malnutrition: Secondary | ICD-10-CM | POA: Diagnosis not present

## 2020-10-03 DIAGNOSIS — R112 Nausea with vomiting, unspecified: Secondary | ICD-10-CM | POA: Diagnosis present

## 2020-10-03 DIAGNOSIS — J90 Pleural effusion, not elsewhere classified: Secondary | ICD-10-CM | POA: Diagnosis present

## 2020-10-03 DIAGNOSIS — R627 Adult failure to thrive: Secondary | ICD-10-CM | POA: Diagnosis present

## 2020-10-03 DIAGNOSIS — D500 Iron deficiency anemia secondary to blood loss (chronic): Secondary | ICD-10-CM | POA: Diagnosis present

## 2020-10-03 DIAGNOSIS — Z91013 Allergy to seafood: Secondary | ICD-10-CM

## 2020-10-03 DIAGNOSIS — Z79899 Other long term (current) drug therapy: Secondary | ICD-10-CM

## 2020-10-03 DIAGNOSIS — Z8249 Family history of ischemic heart disease and other diseases of the circulatory system: Secondary | ICD-10-CM

## 2020-10-03 DIAGNOSIS — R197 Diarrhea, unspecified: Secondary | ICD-10-CM | POA: Diagnosis present

## 2020-10-03 DIAGNOSIS — Z7982 Long term (current) use of aspirin: Secondary | ICD-10-CM

## 2020-10-03 DIAGNOSIS — K219 Gastro-esophageal reflux disease without esophagitis: Secondary | ICD-10-CM | POA: Diagnosis present

## 2020-10-03 DIAGNOSIS — I313 Pericardial effusion (noninflammatory): Secondary | ICD-10-CM | POA: Diagnosis not present

## 2020-10-03 DIAGNOSIS — Z9049 Acquired absence of other specified parts of digestive tract: Secondary | ICD-10-CM

## 2020-10-03 DIAGNOSIS — I10 Essential (primary) hypertension: Secondary | ICD-10-CM | POA: Diagnosis present

## 2020-10-03 DIAGNOSIS — E440 Moderate protein-calorie malnutrition: Secondary | ICD-10-CM | POA: Diagnosis present

## 2020-10-03 DIAGNOSIS — E876 Hypokalemia: Secondary | ICD-10-CM | POA: Diagnosis not present

## 2020-10-03 LAB — BASIC METABOLIC PANEL
Anion gap: 12 (ref 5–15)
BUN: 12 mg/dL (ref 6–20)
CO2: 24 mmol/L (ref 22–32)
Calcium: 8.9 mg/dL (ref 8.9–10.3)
Chloride: 100 mmol/L (ref 98–111)
Creatinine, Ser: 0.71 mg/dL (ref 0.61–1.24)
GFR, Estimated: >60 mL/min (ref >60)
Glucose, Bld: 125 mg/dL — ABNORMAL HIGH (ref 70–99)
Potassium: 3.8 mmol/L (ref 3.5–5.1)
Sodium: 136 mmol/L (ref 135–145)

## 2020-10-03 LAB — PROTIME-INR
INR: 1 (ref 0.8–1.2)
Prothrombin Time: 12.9 seconds (ref 11.4–15.2)

## 2020-10-03 LAB — CBC
HCT: 37 % — ABNORMAL LOW (ref 39.0–52.0)
Hemoglobin: 12.6 g/dL — ABNORMAL LOW (ref 13.0–17.0)
MCH: 32.1 pg (ref 26.0–34.0)
MCHC: 34.1 g/dL (ref 30.0–36.0)
MCV: 94.1 fL (ref 80.0–100.0)
Platelets: 283 10*3/uL (ref 150–400)
RBC: 3.93 MIL/uL — ABNORMAL LOW (ref 4.22–5.81)
RDW: 12.2 % (ref 11.5–15.5)
WBC: 5.6 10*3/uL (ref 4.0–10.5)
nRBC: 0 % (ref 0.0–0.2)

## 2020-10-03 LAB — TROPONIN I (HIGH SENSITIVITY)
Troponin I (High Sensitivity): 7 ng/L (ref <18)
Troponin I (High Sensitivity): 4 ng/L (ref <18)

## 2020-10-03 MED ORDER — SODIUM CHLORIDE 0.9 % IV BOLUS
1000.0000 mL | Freq: Once | INTRAVENOUS | Status: AC
  Administered 2020-10-03: 1000 mL via INTRAVENOUS

## 2020-10-03 MED ORDER — HYDROMORPHONE HCL 1 MG/ML IJ SOLN
1.0000 mg | Freq: Once | INTRAMUSCULAR | Status: AC
  Administered 2020-10-03: 1 mg via INTRAVENOUS
  Filled 2020-10-03: qty 1

## 2020-10-03 MED ORDER — FENTANYL CITRATE (PF) 100 MCG/2ML IJ SOLN
50.0000 ug | Freq: Once | INTRAMUSCULAR | Status: AC
  Administered 2020-10-03: 50 ug via INTRAVENOUS
  Filled 2020-10-03: qty 2

## 2020-10-03 MED ORDER — PROMETHAZINE HCL 25 MG/ML IJ SOLN
12.5000 mg | Freq: Once | INTRAMUSCULAR | Status: AC
  Administered 2020-10-03: 12.5 mg via INTRAVENOUS
  Filled 2020-10-03: qty 1

## 2020-10-03 MED ORDER — DIPHENHYDRAMINE HCL 50 MG/ML IJ SOLN
25.0000 mg | Freq: Once | INTRAMUSCULAR | Status: AC
  Administered 2020-10-03: 25 mg via INTRAVENOUS
  Filled 2020-10-03: qty 1

## 2020-10-03 MED ORDER — METOCLOPRAMIDE HCL 5 MG/ML IJ SOLN
5.0000 mg | Freq: Once | INTRAMUSCULAR | Status: AC
  Administered 2020-10-03: 5 mg via INTRAVENOUS
  Filled 2020-10-03: qty 2

## 2020-10-03 NOTE — ED Provider Notes (Signed)
[facility]-EMERGENCY DEPT Provider Note   CSN: [id number] Arrival date & time: 10/03/20  1725     History Chief Complaint  Patient presents with  . Chest Pain  . hemoptysis  . Diarrhea  . Shortness of Breath    Edwin Hall is a 54 y.o. male.  Presents to ER with concern for hemoptysis, chest pain, abdominal pain, nausea vomiting and diarrhea.  He reports that he has had a couple episodes of coughing up blood, quarter size.  States that the other symptoms have been relatively constant throughout the day.  Pain is worse in his chest, sharp, stabbing pain.  Pressure.  No alleviating or aggravating factors.  Has not been able to eat or drink anything, states that diarrhea is loose, watery.  Recent pleurodesis a week ago.   IBS, severe protein caloric malnutrittion, iron deficiency anemia, HTN, and hyperlipidemia. Patient has had multiple abdominal surgeries in the past including appendectomy 1984, cholecystectomy 2008, ventral hernia repair 2010, gastrostomy tube placement 2016, pylorus plasty 2017, and most recently hiatal hernia surgery repair at [facility] in [place] on 5/3/202  HPI     Past Medical History:  Diagnosis Date  . Complication of anesthesia    nasuea and vomiting  . Food allergy   . Headache   . Hiatal hernia   . Hyperlipidemia   . IBS (irritable bowel syndrome)   . Iron deficiency anemia   . Urticaria     Patient Active Problem List   Diagnosis Date Noted  . FTT (failure to thrive) in adult 10/04/2020  . Intractable nausea and vomiting 10/04/2020  . Intractable vomiting 06/26/2020  . Severe protein-calorie malnutrition Lily Kocher(Gomez: less than 60% of standard weight) (HCC) 06/26/2020  . IBS (irritable bowel syndrome) 06/26/2020  . Dyslipidemia 06/26/2020  . HTN (hypertension) 06/26/2020  . Iron deficiency anemia due to dietary causes 06/26/2020  . Pneumothorax 05/15/2020  . Nausea & vomiting 05/15/2020  . Unintentional weight loss  05/15/2020    Past Surgical History:  Procedure Laterality Date  . ABDOMINAL SURGERY  12/2015   pylorus plasty  . APPENDECTOMY  1984  . BIOPSY  05/18/2020   Procedure: BIOPSY;  Surgeon: Kerin SalenKarki, Arya, MD;  Location: [facility];  Service: Gastroenterology;;  . Ephriam KnucklesHOLECYSTECTOMY  2008  . ESOPHAGOGASTRODUODENOSCOPY (EGD) WITH PROPOFOL N/A 05/18/2020   Procedure: ESOPHAGOGASTRODUODENOSCOPY (EGD) WITH PROPOFOL;  Surgeon: Kerin SalenKarki, Arya, MD;  Location: [facility];  Service: Gastroenterology;  Laterality: N/A;  . GASTROSTOMY TUBE PLACEMENT    . HIATAL HERNIA REPAIR  07/2015  . TALC PLEURODESIS    . TONSILLECTOMY  1980  . VENTRAL HERNIA REPAIR  2010       Family History  Problem Relation Age of Onset  . Parkinson's disease Father   . Heart disease Father   . Angioedema Mother   . Allergic rhinitis Neg Hx   . Asthma Neg Hx   . Eczema Neg Hx   . Immunodeficiency Neg Hx   . Urticaria Neg Hx     Social History   Tobacco Use  . Smoking status: Never Smoker  . Smokeless tobacco: Never Used  Vaping Use  . Vaping Use: Never used  Substance Use Topics  . Alcohol use: No  . Drug use: No    Home Medications Prior to Admission medications   Medication Sig Start Date End Date Taking? Authorizing Provider  aspirin 81 MG EC tablet Take 81 mg by mouth daily. Swallow whole.   Yes [provider]  Cholecalciferol (  VITAMIN D3 PO) Take 250 mcg by mouth daily.    Yes [provider]  Cyanocobalamin (VITAMIN B 12 PO) Take 1 capsule by mouth daily.   Yes [provider]  Ferrous Sulfate (IRON) 325 (65 Fe) MG TABS Take 325 mg by mouth daily.  05/12/20  Yes [provider]  gabapentin (NEURONTIN) 300 MG capsule Take 300 mg by mouth daily.  03/29/20  Yes [provider]  Ginkgo Biloba 40 MG TABS Take 40 mg by mouth daily.    Yes [provider]  ipratropium (ATROVENT) 0.06 % nasal spray USE TWO SPRAYS IN EACH NOSTRIL TWICE DAILY AS DIRECTED. Patient  taking differently: Place 1 spray into both nostrils daily as needed (allergies).  05/22/20  Yes Padgett, Pilar Grammes, MD  LINZESS 290 MCG CAPS capsule Take 290 mcg by mouth daily as needed (abdominal cramping).  07/03/20  Yes [provider]  mirtazapine (REMERON) 15 MG tablet Take 15 mg by mouth at bedtime. 02/08/20  Yes [provider]  Multiple Vitamin (MULTIVITAMIN WITH MINERALS) TABS tablet Take 1 tablet by mouth daily.   Yes [provider]  pantoprazole (PROTONIX) 40 MG tablet Take 1 tablet (40 mg total) by mouth 2 (two) times daily. 05/20/20 10/03/20 Yes Lanae Boast, MD  promethazine (PHENERGAN) 25 MG tablet Take 1 tablet (25 mg total) by mouth every 6 (six) hours as needed for up to 7 days for nausea or vomiting. 05/20/20 10/03/20 Yes Lanae Boast, MD  rosuvastatin (CRESTOR) 5 MG tablet Take 5 mg by mouth once a week. 05/29/20  Yes [provider]  traZODone (DESYREL) 50 MG tablet Take 50 mg by mouth at bedtime as needed for sleep.  07/31/20  Yes [provider]  Zinc 50 MG TABS Take 50 mg by mouth daily.    Yes [provider]  metoCLOPramide (REGLAN) 10 MG tablet Take 1 tablet (10 mg total) by mouth every 8 (eight) hours as needed for up to 15 doses for refractory nausea / vomiting (if not releived with phenergan). 05/20/20 05/27/20  Lanae Boast, MD  oxyCODONE (OXY IR/ROXICODONE) 5 MG immediate release tablet Take 1 tablet (5 mg total) by mouth every 6 (six) hours as needed for up to 10 doses for severe pain. 05/20/20   Lanae Boast, MD  sucralfate (CARAFATE) 1 g tablet Take 1 tablet (1 g total) by mouth 2 (two) times daily for 14 days. 05/20/20 06/03/20  Lanae Boast, MD  azelastine (ASTELIN) 0.1 % nasal spray 2 sprays per nostril twice a day for control of nasal drainage. Patient not taking: Reported on 05/02/2020 11/27/19 05/02/20  Marcelyn Bruins, MD    Allergies    Contrast media [iodinated diagnostic agents], Other, Shellfish allergy, Iodine,  Tetracyclines & related, and Zofran [ondansetron hcl]  Review of Systems   Review of Systems  Constitutional: Negative for chills and fever.  HENT: Negative for ear pain and sore throat.   Eyes: Negative for pain and visual disturbance.  Respiratory: Positive for chest tightness and shortness of breath. Negative for cough.   Cardiovascular: Positive for chest pain. Negative for palpitations.  Gastrointestinal: Negative for abdominal pain and vomiting.  Genitourinary: Negative for dysuria and hematuria.  Musculoskeletal: Negative for arthralgias and back pain.  Skin: Negative for color change and rash.  Neurological: Negative for seizures and syncope.  All other systems reviewed and are negative.   Physical Exam Updated Vital Signs BP 109/79 (BP Location: Left Arm)   Pulse 87   Temp  98.2 F (36.8 C) (Oral)   Resp (!) 26   Ht 5\' 8"  (1.727 m)   Wt 56.2 kg   SpO2 96%   BMI 18.85 kg/m   Physical Exam Vitals and nursing note reviewed.  Constitutional:      Appearance: He is well-developed.  HENT:     Head: Normocephalic and atraumatic.  Eyes:     Conjunctiva/sclera: Conjunctivae normal.  Cardiovascular:     Rate and Rhythm: Normal rate and regular rhythm.     Heart sounds: No murmur heard.   Pulmonary:     Effort: Pulmonary effort is normal. No respiratory distress.     Breath sounds: Normal breath sounds.  Abdominal:     Palpations: Abdomen is soft.     Tenderness: There is no abdominal tenderness.  Musculoskeletal:     Cervical back: Neck supple.     Right lower leg: No edema.     Left lower leg: No edema.  Skin:    General: Skin is warm and dry.  Neurological:     General: No focal deficit present.     Mental Status: He is alert.     ED Results / Procedures / Treatments   Labs (all labs ordered are listed, but only abnormal results are displayed) Labs Reviewed  BASIC METABOLIC PANEL - Abnormal; Notable for the following components:      Result Value    Glucose, Bld 125 (*)    All other components within normal limits  CBC - Abnormal; Notable for the following components:   RBC 3.93 (*)    Hemoglobin 12.6 (*)    HCT 37.0 (*)    All other components within normal limits  RESP PANEL BY RT-PCR (FLU A&B, COVID) [id number]  PROTIME-INR  TROPONIN I (HIGH SENSITIVITY)  TROPONIN I (HIGH SENSITIVITY)    EKG EKG Interpretation  Date/Time:  Friday October 03 2020 17:37:31 EST Ventricular Rate:  110 PR Interval:    QRS Duration: 102 QT Interval:  327 QTC Calculation: 443 R Axis:   18 Text Interpretation: Sinus tachycardia Biatrial enlargement Low voltage, precordial leads 12 Lead; Mason-Likar Confirmed by [id number] (Marianna Fuss) on 10/03/2020 6:18:06 PM   Radiology DG Chest 2 View  Result Date: 10/03/2020 CLINICAL DATA:  Pleurodesis September 24, 2020. Hemoptysis beginning 2 days ago. EXAM: CHEST - 2 VIEW COMPARISON:  October 01, 2020 FINDINGS: The right Port-A-Cath is in good position. No pneumothorax. The right lung is clear. A pleural effusion is again identified on the left. Masslike opacity in the periphery of the left mid lung is stable. The cardiomediastinal silhouette is stable. No other acute abnormalities. IMPRESSION: No interval changes. The masslike opacity in the periphery of the left upper lung is stable. Stable left effusion and elevation left hemidiaphragm. Electronically Signed   By: October 03, 2020 III M.D   On: 10/03/2020 19:35    Procedures Procedures (including critical care time)  Medications Ordered in ED Medications  sodium chloride 0.9 % bolus 1,000 mL (0 mLs Intravenous Stopped 10/03/20 2133)  promethazine (PHENERGAN) injection 12.5 mg (12.5 mg Intravenous Given 10/03/20 1925)  fentaNYL (SUBLIMAZE) injection 50 mcg (50 mcg Intravenous Given 10/03/20 1924)  HYDROmorphone (DILAUDID) injection 1 mg (1 mg Intravenous Given 10/03/20 2045)  metoCLOPramide (REGLAN) injection 5 mg (5 mg Intravenous Given 10/03/20 2242)    HYDROmorphone (DILAUDID) injection 1 mg (1 mg Intravenous Given 10/03/20 2255)  diphenhydrAMINE (BENADRYL) injection 25 mg (25 mg Intravenous Given 10/03/20 2305)    ED Course  I  have reviewed the triage vital signs and the nursing notes.  Pertinent labs & imaging results that were available during my care of the patient were reviewed by me and considered in my medical decision making (see chart for details).  Clinical Course as of Oct 04 48  Caleen Essex Oct 03, 2020  1916 [phone]   [RD]  2215 [phone]   [RD]    Clinical Course User Index [RD] Milagros Loll, MD   MDM Rules/Calculators/A&P                         54 year old male presents to ER with concern for chest pain, shortness of breath, abdominal pain, nausea vomiting, diarrhea.  On exam patient was somewhat uncomfortable but he was in no acute distress and had normal vital signs.  For any hemoptysis, suspect this was related to recent procedure, describes relatively small volume.  No ongoing episodes in ER.  Patient reports that he had discussed with his CT surgeon and was told this was an expected finding postoperatively.  Attempted to contact our CT surgeon on call but was busy in OR.  Given he has no ongoing hemoptysis and is well-appearing and CT scan of his chest was negative, low suspicion for acute complication from this procedure.  CT scan of his abdomen pelvis was obtained due to his nausea and vomiting and diarrhea.  This was also negative for acute pathology.  His lab work was grossly within normal limits.  EKG nonischemic, troponin within normal limits.  Doubt ACS.  No tachypnea, no tachycardia or hypoxia, doubt PE.  Throughout this extensive work-up, patient continued to have what he reported was severe symptoms.  Due to difficulty in controlling symptoms, believe patient would benefit from admission for further management of symptoms and observation.  Consult to hospitalist service, Dr. Hanley Hays will admit.  Final  Clinical Impression(s) / ED Diagnoses Final diagnoses:  Intractable abdominal pain  Intractable vomiting with nausea, unspecified vomiting type  Chest pain, unspecified type  Hemoptysis    Rx / DC Orders ED Discharge Orders    None       Milagros Loll, MD 10/04/20 [phone]

## 2020-10-03 NOTE — ED Notes (Signed)
Pt to CT

## 2020-10-03 NOTE — ED Notes (Signed)
Pt has a port and request to wait for blood to be drawn from port

## 2020-10-03 NOTE — ED Triage Notes (Signed)
Patient reports that he had a pleurodesis on 09/24/20. Patient states he began having hemoptysis 2 days ago along with chest pain, SOB, and diarrhea.

## 2020-10-04 DIAGNOSIS — R112 Nausea with vomiting, unspecified: Secondary | ICD-10-CM

## 2020-10-04 DIAGNOSIS — R627 Adult failure to thrive: Secondary | ICD-10-CM | POA: Diagnosis present

## 2020-10-04 LAB — COMPREHENSIVE METABOLIC PANEL
ALT: 29 U/L (ref 0–44)
AST: 20 U/L (ref 15–41)
Albumin: 2.6 g/dL — ABNORMAL LOW (ref 3.5–5.0)
Alkaline Phosphatase: 59 U/L (ref 38–126)
Anion gap: 10 (ref 5–15)
BUN: 10 mg/dL (ref 6–20)
CO2: 23 mmol/L (ref 22–32)
Calcium: 7.9 mg/dL — ABNORMAL LOW (ref 8.9–10.3)
Chloride: 103 mmol/L (ref 98–111)
Creatinine, Ser: 0.73 mg/dL (ref 0.61–1.24)
GFR, Estimated: >60 mL/min (ref >60)
Glucose, Bld: 778 mg/dL (ref 70–99)
Potassium: 3.6 mmol/L (ref 3.5–5.1)
Sodium: 136 mmol/L (ref 135–145)
Total Bilirubin: 0.6 mg/dL (ref 0.3–1.2)
Total Protein: 4.6 g/dL — ABNORMAL LOW (ref 6.5–8.1)

## 2020-10-04 LAB — RAPID URINE DRUG SCREEN, HOSP PERFORMED
Amphetamines: NOT DETECTED
Barbiturates: NOT DETECTED
Benzodiazepines: NOT DETECTED
Cocaine: NOT DETECTED
Opiates: POSITIVE — AB
Tetrahydrocannabinol: NOT DETECTED

## 2020-10-04 LAB — CBC
HCT: 33.9 % — ABNORMAL LOW (ref 39.0–52.0)
HCT: 30 % — ABNORMAL LOW (ref 39.0–52.0)
Hemoglobin: 11.4 g/dL — ABNORMAL LOW (ref 13.0–17.0)
Hemoglobin: 9.9 g/dL — ABNORMAL LOW (ref 13.0–17.0)
MCH: 31.8 pg (ref 26.0–34.0)
MCH: 31.6 pg (ref 26.0–34.0)
MCHC: 33.6 g/dL (ref 30.0–36.0)
MCHC: 33 g/dL (ref 30.0–36.0)
MCV: 94.7 fL (ref 80.0–100.0)
MCV: 95.8 fL (ref 80.0–100.0)
Platelets: 257 10*3/uL (ref 150–400)
Platelets: 263 10*3/uL (ref 150–400)
RBC: 3.58 MIL/uL — ABNORMAL LOW (ref 4.22–5.81)
RBC: 3.13 MIL/uL — ABNORMAL LOW (ref 4.22–5.81)
RDW: 12.1 % (ref 11.5–15.5)
RDW: 12.3 % (ref 11.5–15.5)
WBC: 4.5 10*3/uL (ref 4.0–10.5)
WBC: 4.2 10*3/uL (ref 4.0–10.5)
nRBC: 0 % (ref 0.0–0.2)
nRBC: 0 % (ref 0.0–0.2)

## 2020-10-04 LAB — BASIC METABOLIC PANEL
Anion gap: 7 (ref 5–15)
BUN: 11 mg/dL (ref 6–20)
CO2: 22 mmol/L (ref 22–32)
Calcium: 7.7 mg/dL — ABNORMAL LOW (ref 8.9–10.3)
Chloride: 106 mmol/L (ref 98–111)
Creatinine, Ser: 0.73 mg/dL (ref 0.61–1.24)
GFR, Estimated: >60 mL/min (ref >60)
Glucose, Bld: 216 mg/dL — ABNORMAL HIGH (ref 70–99)
Potassium: 3.4 mmol/L — ABNORMAL LOW (ref 3.5–5.1)
Sodium: 135 mmol/L (ref 135–145)

## 2020-10-04 LAB — CREATININE, SERUM
Creatinine, Ser: 0.7 mg/dL (ref 0.61–1.24)
GFR, Estimated: >60 mL/min (ref >60)

## 2020-10-04 LAB — CBG MONITORING, ED: Glucose-Capillary: 84 mg/dL (ref 70–99)

## 2020-10-04 LAB — RESP PANEL BY RT-PCR (FLU A&B, COVID) ARPGX2
Influenza A by PCR: NEGATIVE
Influenza B by PCR: NEGATIVE
SARS Coronavirus 2 by RT PCR: NEGATIVE

## 2020-10-04 MED ORDER — KETOROLAC TROMETHAMINE 30 MG/ML IJ SOLN
15.0000 mg | Freq: Four times a day (QID) | INTRAMUSCULAR | Status: DC | PRN
  Administered 2020-10-04 – 2020-10-06 (×4): 15 mg via INTRAVENOUS
  Filled 2020-10-04 (×4): qty 1

## 2020-10-04 MED ORDER — PANTOPRAZOLE SODIUM 40 MG PO TBEC
40.0000 mg | DELAYED_RELEASE_TABLET | Freq: Two times a day (BID) | ORAL | Status: DC
  Administered 2020-10-04 – 2020-10-06 (×6): 40 mg via ORAL
  Filled 2020-10-04 (×6): qty 1

## 2020-10-04 MED ORDER — PROMETHAZINE HCL 25 MG PO TABS
25.0000 mg | ORAL_TABLET | Freq: Four times a day (QID) | ORAL | Status: DC | PRN
  Administered 2020-10-04: 25 mg via ORAL
  Filled 2020-10-04: qty 1

## 2020-10-04 MED ORDER — FERROUS SULFATE 325 (65 FE) MG PO TABS
325.0000 mg | ORAL_TABLET | Freq: Every day | ORAL | Status: DC
  Administered 2020-10-04 – 2020-10-06 (×3): 325 mg via ORAL
  Filled 2020-10-04 (×3): qty 1

## 2020-10-04 MED ORDER — TRAZODONE HCL 50 MG PO TABS
50.0000 mg | ORAL_TABLET | Freq: Every evening | ORAL | Status: DC | PRN
  Administered 2020-10-04: 50 mg via ORAL
  Filled 2020-10-04: qty 1

## 2020-10-04 MED ORDER — LINACLOTIDE 145 MCG PO CAPS
290.0000 ug | ORAL_CAPSULE | Freq: Every day | ORAL | Status: DC | PRN
  Filled 2020-10-04: qty 2

## 2020-10-04 MED ORDER — MIRTAZAPINE 15 MG PO TABS
15.0000 mg | ORAL_TABLET | Freq: Every day | ORAL | Status: DC
  Administered 2020-10-04 – 2020-10-05 (×3): 15 mg via ORAL
  Filled 2020-10-04 (×2): qty 1
  Filled 2020-10-04: qty 2

## 2020-10-04 MED ORDER — ENOXAPARIN SODIUM 40 MG/0.4ML ~~LOC~~ SOLN
40.0000 mg | SUBCUTANEOUS | Status: DC
  Administered 2020-10-04 – 2020-10-06 (×3): 40 mg via SUBCUTANEOUS
  Filled 2020-10-04 (×3): qty 0.4

## 2020-10-04 MED ORDER — VITAMIN B-12 100 MCG PO TABS
ORAL_TABLET | Freq: Every day | ORAL | Status: DC
  Administered 2020-10-04 – 2020-10-06 (×3): 100 ug via ORAL
  Filled 2020-10-04 (×3): qty 1

## 2020-10-04 MED ORDER — GABAPENTIN 300 MG PO CAPS
300.0000 mg | ORAL_CAPSULE | Freq: Every day | ORAL | Status: DC
  Administered 2020-10-04 – 2020-10-06 (×3): 300 mg via ORAL
  Filled 2020-10-04 (×3): qty 1

## 2020-10-04 MED ORDER — POTASSIUM CHLORIDE CRYS ER 20 MEQ PO TBCR
40.0000 meq | EXTENDED_RELEASE_TABLET | Freq: Once | ORAL | Status: AC
  Administered 2020-10-04: 40 meq via ORAL
  Filled 2020-10-04: qty 2

## 2020-10-04 MED ORDER — DIPHENHYDRAMINE-ZINC ACETATE 2-0.1 % EX CREA
TOPICAL_CREAM | Freq: Two times a day (BID) | CUTANEOUS | Status: DC | PRN
  Filled 2020-10-04: qty 28

## 2020-10-04 MED ORDER — ROSUVASTATIN CALCIUM 5 MG PO TABS
5.0000 mg | ORAL_TABLET | ORAL | Status: DC
  Administered 2020-10-04: 5 mg via ORAL
  Filled 2020-10-04 (×2): qty 1

## 2020-10-04 MED ORDER — DIPHENHYDRAMINE HCL 50 MG/ML IJ SOLN
25.0000 mg | Freq: Once | INTRAMUSCULAR | Status: AC
  Administered 2020-10-04: 25 mg via INTRAVENOUS
  Filled 2020-10-04: qty 1

## 2020-10-04 MED ORDER — MORPHINE SULFATE (PF) 2 MG/ML IV SOLN
0.5000 mg | Freq: Four times a day (QID) | INTRAVENOUS | Status: DC | PRN
  Administered 2020-10-04 – 2020-10-06 (×9): 1 mg via INTRAVENOUS
  Filled 2020-10-04 (×9): qty 1

## 2020-10-04 MED ORDER — IPRATROPIUM BROMIDE 0.06 % NA SOLN: 1.0000 | Freq: Every day | NASAL | Status: DC | PRN

## 2020-10-04 MED ORDER — ZINC SULFATE 220 (50 ZN) MG PO CAPS
220.0000 mg | ORAL_CAPSULE | Freq: Every day | ORAL | Status: DC
  Administered 2020-10-04 – 2020-10-06 (×3): 220 mg via ORAL
  Filled 2020-10-04 (×3): qty 1

## 2020-10-04 MED ORDER — HYDROMORPHONE HCL 1 MG/ML IJ SOLN
1.0000 mg | Freq: Once | INTRAMUSCULAR | Status: AC
  Administered 2020-10-04: 1 mg via INTRAVENOUS
  Filled 2020-10-04: qty 1

## 2020-10-04 MED ORDER — ADULT MULTIVITAMIN W/MINERALS CH
1.0000 | ORAL_TABLET | Freq: Every day | ORAL | Status: DC
  Administered 2020-10-04 – 2020-10-06 (×3): 1 via ORAL
  Filled 2020-10-04 (×3): qty 1

## 2020-10-04 MED ORDER — DIPHENHYDRAMINE HCL 25 MG PO CAPS
25.0000 mg | ORAL_CAPSULE | Freq: Three times a day (TID) | ORAL | Status: DC | PRN
  Administered 2020-10-04: 25 mg via ORAL
  Filled 2020-10-04: qty 1

## 2020-10-04 MED ORDER — METOCLOPRAMIDE HCL 10 MG PO TABS
10.0000 mg | ORAL_TABLET | Freq: Three times a day (TID) | ORAL | Status: DC | PRN
  Administered 2020-10-04: 10 mg via ORAL
  Filled 2020-10-04: qty 1

## 2020-10-04 MED ORDER — SUCRALFATE 1 G PO TABS
1.0000 g | ORAL_TABLET | Freq: Two times a day (BID) | ORAL | Status: DC
  Administered 2020-10-04 – 2020-10-06 (×6): 1 g via ORAL
  Filled 2020-10-04 (×6): qty 1

## 2020-10-04 MED ORDER — KETOROLAC TROMETHAMINE 30 MG/ML IJ SOLN
30.0000 mg | Freq: Four times a day (QID) | INTRAMUSCULAR | Status: DC | PRN
  Administered 2020-10-04: 30 mg via INTRAVENOUS
  Filled 2020-10-04: qty 1

## 2020-10-04 MED ORDER — CHLORHEXIDINE GLUCONATE CLOTH 2 % EX PADS: 6.0000 | MEDICATED_PAD | Freq: Every day | CUTANEOUS | Status: DC

## 2020-10-04 MED ORDER — PROMETHAZINE HCL 25 MG/ML IJ SOLN
6.2500 mg | Freq: Four times a day (QID) | INTRAMUSCULAR | Status: DC | PRN
  Administered 2020-10-04 – 2020-10-06 (×8): 6.25 mg via INTRAVENOUS
  Filled 2020-10-04 (×8): qty 1

## 2020-10-04 MED ORDER — DIPHENHYDRAMINE HCL 25 MG PO CAPS
25.0000 mg | ORAL_CAPSULE | Freq: Four times a day (QID) | ORAL | Status: DC | PRN
  Filled 2020-10-04: qty 1

## 2020-10-04 MED ORDER — OXYCODONE HCL 5 MG PO TABS
5.0000 mg | ORAL_TABLET | Freq: Four times a day (QID) | ORAL | Status: DC | PRN
  Administered 2020-10-04 – 2020-10-06 (×2): 5 mg via ORAL
  Filled 2020-10-04 (×2): qty 1

## 2020-10-04 MED ORDER — CHLORHEXIDINE GLUCONATE CLOTH 2 % EX PADS
6.0000 | MEDICATED_PAD | Freq: Every day | CUTANEOUS | Status: DC
  Administered 2020-10-05 – 2020-10-06 (×2): 6 via TOPICAL

## 2020-10-04 MED ORDER — DEXTROSE IN LACTATED RINGERS 5 % IV SOLN: INTRAVENOUS | Status: DC

## 2020-10-04 MED ORDER — GINKGO BILOBA 40 MG PO TABS: 40.0000 mg | ORAL_TABLET | Freq: Every day | ORAL | Status: DC

## 2020-10-04 MED ORDER — ASPIRIN EC 81 MG PO TBEC
81.0000 mg | DELAYED_RELEASE_TABLET | Freq: Every day | ORAL | Status: DC
  Administered 2020-10-04 – 2020-10-06 (×3): 81 mg via ORAL
  Filled 2020-10-04 (×3): qty 1

## 2020-10-04 MED ORDER — VITAMIN D3 25 MCG (1000 UNIT) PO TABS
250.0000 ug | ORAL_TABLET | Freq: Every day | ORAL | Status: DC
  Administered 2020-10-04 – 2020-10-06 (×3): 10000 [IU] via ORAL
  Filled 2020-10-04 (×4): qty 10

## 2020-10-04 MED ORDER — PROMETHAZINE HCL 25 MG PO TABS
12.5000 mg | ORAL_TABLET | Freq: Four times a day (QID) | ORAL | Status: DC | PRN
  Administered 2020-10-04: 12.5 mg via ORAL
  Filled 2020-10-04: qty 1

## 2020-10-04 NOTE — ED Notes (Signed)
Pt called out complaining of pain.

## 2020-10-04 NOTE — ED Notes (Signed)
Report given to RN Jo.

## 2020-10-04 NOTE — ED Notes (Signed)
Patient continually calling out for pain, nausea, and medication for itching. Reminded patient that his medications are q.6h and informed patient of his next dose times. Patient currently in NAD.

## 2020-10-04 NOTE — ED Notes (Signed)
Pt requesting additional medication for nausea.

## 2020-10-04 NOTE — ED Notes (Signed)
Pt states he is in pain. Gave pain meds per MAR. Pt watching tv and talking on phone. Respirations even and unlabored.

## 2020-10-04 NOTE — H&P (Signed)
History and Physical   Edwin Hall TKW:[id number] DOB: 09/23/1966 DOA: 10/03/2020  Referring MD/NP/PA: Dr Stevie Kern  PCP: Edwin Greenspan, MD   Outpatient Specialists: In [place], Cardiothoracic Surgery   Patient coming from: Home  Chief Complaint: Intractable nausea vomiting   HPI: Edwin Hall is a 54 y.o. male with medical history significant of severe hiatal hernia status post repair, multiple pneumothoraxes at least 5 times, multiple surgeries with recent delivery DC about a week ago in [place] where he follows up with his thoracic surgeon hyperlipidemia, hypertension, GERD, who presented to the ER here after cutting his CT surgeon in [place] to complain about episodes of hemoptysis following the pleurodesis.  He was told that the hemoptysis is expected after such a procedure.  Patient however has been having significant nausea vomiting.  He has been unable to keep anything down.  Associated with that is pain that is not resolving.  Patient has failed to thrive at home.  He has had multiple surgeries and has a Port-A-Cath in place.  Attempt to control his symptoms in the ER with pain management as well as nausea vomiting management has failed.  Recommendation is to admit the patient for intractable nausea vomiting for observation and symptom control.  All his surgery and GI as well as other specialists are in [place]..  ED Course: Temperature 98.2 blood pressure 157/136 pulse 118 respirate of 30 oxygen sat 96% room air.  White count 5.6 hemoglobin 12.6 platelet 283.  Chemistry largely within normal.  CT abdomen pelvis and CT chest without contrast did not show any acute findings.  Patient being admitted for observation and symptom control  Review of Systems: As per HPI otherwise 10 point review of systems negative.    Past Medical History:  Diagnosis Date  . Complication of anesthesia    nasuea and vomiting  . Food allergy   . Headache   . Hiatal hernia   . Hyperlipidemia    . IBS (irritable bowel syndrome)   . Iron deficiency anemia   . Urticaria     Past Surgical History:  Procedure Laterality Date  . ABDOMINAL SURGERY  12/2015   pylorus plasty  . APPENDECTOMY  1984  . BIOPSY  05/18/2020   Procedure: BIOPSY;  Surgeon: Edwin Salen, MD;  Location: [facility];  Service: Gastroenterology;;  . Edwin Hall  . ESOPHAGOGASTRODUODENOSCOPY (EGD) WITH PROPOFOL N/A 05/18/2020   Procedure: ESOPHAGOGASTRODUODENOSCOPY (EGD) WITH PROPOFOL;  Surgeon: Edwin Salen, MD;  Location: [facility];  Service: Gastroenterology;  Laterality: N/A;  . GASTROSTOMY TUBE PLACEMENT    . HIATAL HERNIA REPAIR  07/2015  . TALC PLEURODESIS    . TONSILLECTOMY  1980  . VENTRAL HERNIA REPAIR  2010     reports that he has never smoked. He has never used smokeless tobacco. He reports that he does not drink alcohol and does not use drugs.  Allergies  Allergen Reactions  . Contrast Media [Iodinated Diagnostic Agents] Hives  . Other Anaphylaxis    All seafood   . Shellfish Allergy Anaphylaxis  . Iodine Hives  . Tetracyclines & Related Itching  . Zofran [Ondansetron Hcl] Itching and Rash    Family History  Problem Relation Age of Onset  . Parkinson's disease Father   . Heart disease Father   . Angioedema Mother   . Allergic rhinitis Neg Hx   . Asthma Neg Hx   . Eczema Neg Hx   . Immunodeficiency Neg Hx   . Urticaria Neg Hx  Prior to Admission medications   Medication Sig Start Date End Date Taking? Authorizing Provider  aspirin 81 MG EC tablet Take 81 mg by mouth daily. Swallow whole.   Yes [provider]  Cholecalciferol (VITAMIN D3 PO) Take 250 mcg by mouth daily.    Yes [provider]  Cyanocobalamin (VITAMIN B 12 PO) Take 1 capsule by mouth daily.   Yes [provider]  Ferrous Sulfate (IRON) 325 (65 Fe) MG TABS Take 325 mg by mouth daily.  05/12/20  Yes [provider]  gabapentin (NEURONTIN) 300 MG capsule Take 300 mg by  mouth daily.  03/29/20  Yes [provider]  Ginkgo Biloba 40 MG TABS Take 40 mg by mouth daily.    Yes [provider]  ipratropium (ATROVENT) 0.06 % nasal spray USE TWO SPRAYS IN EACH NOSTRIL TWICE DAILY AS DIRECTED. Patient taking differently: Place 1 spray into both nostrils daily as needed (allergies).  05/22/20  Yes Hall, Edwin Grammes, MD  LINZESS 290 MCG CAPS capsule Take 290 mcg by mouth daily as needed (abdominal cramping).  07/03/20  Yes [provider]  mirtazapine (REMERON) 15 MG tablet Take 15 mg by mouth at bedtime. 02/08/20  Yes [provider]  Multiple Vitamin (MULTIVITAMIN WITH MINERALS) TABS tablet Take 1 tablet by mouth daily.   Yes [provider]  pantoprazole (PROTONIX) 40 MG tablet Take 1 tablet (40 mg total) by mouth 2 (two) times daily. 05/20/20 10/03/20 Yes Edwin Boast, MD  promethazine (PHENERGAN) 25 MG tablet Take 1 tablet (25 mg total) by mouth every 6 (six) hours as needed for up to 7 days for nausea or vomiting. 05/20/20 10/03/20 Yes Edwin Boast, MD  rosuvastatin (CRESTOR) 5 MG tablet Take 5 mg by mouth once a week. 05/29/20  Yes [provider]  traZODone (DESYREL) 50 MG tablet Take 50 mg by mouth at bedtime as needed for sleep.  07/31/20  Yes [provider]  Zinc 50 MG TABS Take 50 mg by mouth daily.    Yes [provider]  metoCLOPramide (REGLAN) 10 MG tablet Take 1 tablet (10 mg total) by mouth every 8 (eight) hours as needed for up to 15 doses for refractory nausea / vomiting (if not releived with phenergan). 05/20/20 05/27/20  Edwin Boast, MD  oxyCODONE (OXY IR/ROXICODONE) 5 MG immediate release tablet Take 1 tablet (5 mg total) by mouth every 6 (six) hours as needed for up to 10 doses for severe pain. 05/20/20   Edwin Boast, MD  sucralfate (CARAFATE) 1 g tablet Take 1 tablet (1 g total) by mouth 2 (two) times daily for 14 days. 05/20/20 06/03/20  Edwin Boast, MD  azelastine (ASTELIN) 0.1 % nasal spray 2  sprays per nostril twice a day for control of nasal drainage. Patient not taking: Reported on 05/02/2020 11/27/19 05/02/20  Edwin Bruins, MD    Physical Exam: Vitals:   10/03/20 2300 10/03/20 2309 10/03/20 2330 10/04/20 0030  BP: (!) 157/136 126/89 130/87 109/79  Pulse: 91 92 89 87  Resp: 19 17 (!) 24 (!) 26  Temp:      TempSrc:      SpO2: 98% 99% 99% 96%  Weight:      Height:          Constitutional: Acutely ill looking no distress Vitals:   10/03/20 2300 10/03/20 2309 10/03/20 2330 10/04/20 0030  BP: (!) 157/136 126/89 130/87 109/79  Pulse: 91 92 89 87  Resp: 19 17 (!) 24 (!)  26  Temp:      TempSrc:      SpO2: 98% 99% 99% 96%  Weight:      Height:       Eyes: PERRL, lids and conjunctivae normal ENMT: Mucous membranes are dry. Posterior pharynx clear of any exudate or lesions.Normal dentition.  Neck: normal, supple, no masses, no thyromegaly Respiratory: Port-A-Cath in place clear to auscultation bilaterally, no wheezing, no crackles. Normal respiratory effort. No accessory muscle use.  Cardiovascular: Regular rate and rhythm, no murmurs / rubs / gallops. No extremity edema. 2+ pedal pulses. No carotid bruits.  Abdomen: Mild diffuse tenderness, no masses palpated. No hepatosplenomegaly. Bowel sounds positive.  Musculoskeletal: no clubbing / cyanosis. No joint deformity upper and lower extremities. Good ROM, no contractures. Normal muscle tone.  Skin: no rashes, lesions, ulcers. No induration Neurologic: CN 2-12 grossly intact. Sensation intact, DTR normal. Strength 5/5 in all 4.  Psychiatric: Normal judgment and insight. Alert and oriented x 3. Normal mood.     Labs on Admission: I have personally reviewed following labs and imaging studies  CBC: Recent Labs  Lab 10/03/20 1845  WBC 5.6  HGB 12.6*  HCT 37.0*  MCV 94.1  PLT 283   Basic Metabolic Panel: Recent Labs  Lab 10/03/20 1845  NA 136  K 3.8  CL 100  CO2 24  GLUCOSE 125*  BUN 12    CREATININE 0.71  CALCIUM 8.9   GFR: Estimated Creatinine Clearance: 84.9 mL/min (by C-G formula based on SCr of 0.71 mg/dL). Liver Function Tests: No results for input(s): AST, ALT, ALKPHOS, BILITOT, PROT, ALBUMIN in the last 168 hours. No results for input(s): LIPASE, AMYLASE in the last 168 hours. No results for input(s): AMMONIA in the last 168 hours. Coagulation Profile: Recent Labs  Lab 10/03/20 1838  INR 1.0   Cardiac Enzymes: No results for input(s): CKTOTAL, CKMB, CKMBINDEX, TROPONINI in the last 168 hours. BNP (last 3 results) No results for input(s): PROBNP in the last 8760 hours. HbA1C: No results for input(s): HGBA1C in the last 72 hours. CBG: No results for input(s): GLUCAP in the last 168 hours. Lipid Profile: No results for input(s): CHOL, HDL, LDLCALC, TRIG, CHOLHDL, LDLDIRECT in the last 72 hours. Thyroid Function medical conditions.  Just discharged from the hospital.  Per patient he was still having vomiting at the time the discharge and last week.  He went home and continued to have the symptoms.  We will admit him for symptom control.  Depending on how he does: No results for input(s): TSH, T4TOTAL, FREET4, T3FREE, THYROIDAB in the last 72 hours. Anemia Panel: No results for input(s): VITAMINB12, FOLATE, FERRITIN, TIBC, IRON, RETICCTPCT in the last 72 hours. Urine analysis:    Component Value Date/Time   COLORURINE AMBER (A) 05/16/2020 1800   APPEARANCEUR CLEAR 05/16/2020 1800   LABSPEC 1.021 05/16/2020 1800   PHURINE 5.0 05/16/2020 1800   GLUCOSEU NEGATIVE 05/16/2020 1800   HGBUR NEGATIVE 05/16/2020 1800   BILIRUBINUR NEGATIVE 05/16/2020 1800   KETONESUR 20 (A) 05/16/2020 1800   PROTEINUR NEGATIVE 05/16/2020 1800   NITRITE NEGATIVE 05/16/2020 1800   LEUKOCYTESUR NEGATIVE 05/16/2020 1800   Sepsis Labs: @LABRCNTIP (procalcitonin:4,lacticidven:4) )No results found for this or any previous visit (from the past 240 hour(s)).   Radiological Exams on  Admission: DG Chest 2 View  Result Date: 10/03/2020 CLINICAL DATA:  Pleurodesis September 24, 2020. Hemoptysis beginning 2 days ago. EXAM: CHEST - 2 VIEW COMPARISON:  October 01, 2020 FINDINGS: The right Port-A-Cath is  in good position. No pneumothorax. The right lung is clear. A pleural effusion is again identified on the left. Masslike opacity in the periphery of the left mid lung is stable. The cardiomediastinal silhouette is stable. No other acute abnormalities. IMPRESSION: No interval changes. The masslike opacity in the periphery of the left upper lung is stable. Stable left effusion and elevation left hemidiaphragm. Electronically Signed   By: Gerome Sam III M.D   On: 10/03/2020 19:35    EKG: Independently reviewed.  Sinus tachycardia  Assessment/Plan Principal Problem:   Intractable nausea and vomiting Active Problems:   Severe protein-calorie malnutrition Lily Kocher: less than 60% of standard weight) (HCC)   HTN (hypertension)   FTT (failure to thrive) in adult     #1 intractable nausea and vomiting: Appears recurrent.  Most likely related to patient's recent surgery.  Patient will be admitted for symptoms control mainly.  Hydrate and monitor.  #2  Intractable pain: Appears chronic.  Patient is on narcotics at home.  I will use Toradol here.  Admitted clear to the patient that our role here is for symptoms control once he is better he should follow-up with his CT surgeon for all his other care.  #3 failure to thrive in an adult: Multifactorial.  Continue with dietary and symptoms control.  #4 essential hypertension: Continue home regimen.  #5 severe protein calorie malnutrition: Control symptoms and encourage oral intake once able to keep food down.   DVT prophylaxis: Lovenox Code Status: Full code Family Communication: No family at bedside Disposition Plan: Home Consults called: None Admission status: Observation  Severity of Illness: The appropriate patient status  for this patient is OBSERVATION. Observation status is judged to be reasonable and necessary in order to provide the required intensity of service to ensure the patient's safety. The patient's presenting symptoms, physical exam findings, and initial radiographic and laboratory data in the context of their medical condition is felt to place them at decreased risk for further clinical deterioration. Furthermore, it is anticipated that the patient will be medically stable for discharge from the hospital within 2 midnights of admission. The following factors support the patient status of observation.   " The patient's presenting symptoms include nausea with vomiting. " The physical exam findings include cachexia. " The initial radiographic and laboratory data are mostly within normal.     [facility] MD [facility] Pager [phone]  If 7PM-7AM, please contact night-coverage [email or web address] Password [facility]  10/04/2020, 12:44 AM

## 2020-10-04 NOTE — ED Notes (Signed)
Pt states he wants to be discharged if MD is not going to order dilaudid. States he has meds at home that can help him. MD made aware. Respirations even and unlabored.

## 2020-10-04 NOTE — ED Notes (Signed)
Date and time results received: 10/04/20 6:15 AM  Test: glucose Critical Value: 778  Name of Provider Notified: Kathlen Mody, MD  Orders Received? Or Actions Taken?: Orders Received - See Orders for details

## 2020-10-04 NOTE — Plan of Care (Signed)
  Problem: Education: Goal: Knowledge of General Education information will improve Description: Including pain rating scale, medication(s)/side effects and non-pharmacologic comfort measures Outcome: Progressing   Problem: Nutrition: Goal: Adequate nutrition will be maintained Outcome: Progressing   

## 2020-10-04 NOTE — ED Notes (Signed)
Pt requesting more pain medicine and said the last dose didn't touch his pain.

## 2020-10-04 NOTE — ED Notes (Signed)
Pt complaining of pain and nausea. Doctor was notified.

## 2020-10-04 NOTE — Progress Notes (Signed)
Edwin Hall is a 54 y.o. male with medical history significant of severe hiatal hernia status post repair, multiple pneumothoraxes at least 5 times, multiple surgeries with recent delivery DC about a week ago in [place] where he follows up with his thoracic surgeon hyperlipidemia, hypertension, GERD, who presented to the ER here for persistent nausea and vomiting.    He was started on symptomatic management with IV fluds, IV phenergan .  Pt was started on clear liquid diet and advance diet as tolerated.    Pt seen and examined at bedside.   Kathlen Mody, MD

## 2020-10-04 NOTE — Progress Notes (Signed)
PHARMACIST - PHYSICIAN ORDER COMMUNICATION  CONCERNING: P&T Medication Policy on Herbal Medications  DESCRIPTION:  This patient's order for:  Gingo Biloba  has been noted.  This product(s) is classified as an "herbal" or natural product. Due to a lack of definitive safety studies or FDA approval, nonstandard manufacturing practices, plus the potential risk of unknown drug-drug interactions while on inpatient medications, the Pharmacy and Therapeutics Committee does not permit the use of "herbal" or natural products of this type within [facility].   ACTION TAKEN: The pharmacy department is unable to verify this order at this time and your patient has been informed of this safety policy. Please reevaluate patient's clinical condition at discharge and address if the herbal or natural product(s) should be resumed at that time.   Arley Phenix RPh 10/04/2020, 1:12 AM

## 2020-10-05 ENCOUNTER — Observation Stay (HOSPITAL_COMMUNITY): Payer: Medicare Other

## 2020-10-05 DIAGNOSIS — R079 Chest pain, unspecified: Secondary | ICD-10-CM | POA: Diagnosis not present

## 2020-10-05 DIAGNOSIS — R112 Nausea with vomiting, unspecified: Secondary | ICD-10-CM | POA: Diagnosis not present

## 2020-10-05 DIAGNOSIS — R042 Hemoptysis: Secondary | ICD-10-CM

## 2020-10-05 LAB — HEMOGLOBIN AND HEMATOCRIT, BLOOD
HCT: 32.3 % — ABNORMAL LOW (ref 39.0–52.0)
Hemoglobin: 10.6 g/dL — ABNORMAL LOW (ref 13.0–17.0)

## 2020-10-05 MED ORDER — DIPHENHYDRAMINE HCL 50 MG/ML IJ SOLN
25.0000 mg | Freq: Once | INTRAMUSCULAR | Status: AC
  Administered 2020-10-05: 25 mg via INTRAVENOUS
  Filled 2020-10-05: qty 1

## 2020-10-05 MED ORDER — SODIUM CHLORIDE 0.9 % IV SOLN: INTRAVENOUS | Status: DC

## 2020-10-05 MED ORDER — SODIUM CHLORIDE 0.9 % IV SOLN
1.0000 g | Freq: Every day | INTRAVENOUS | Status: DC
  Administered 2020-10-05: 1 g via INTRAVENOUS
  Filled 2020-10-05: qty 10
  Filled 2020-10-05: qty 1

## 2020-10-05 MED ORDER — DIPHENHYDRAMINE HCL 25 MG PO CAPS
25.0000 mg | ORAL_CAPSULE | Freq: Four times a day (QID) | ORAL | Status: DC | PRN
  Administered 2020-10-05 – 2020-10-06 (×2): 25 mg via ORAL
  Filled 2020-10-05 (×2): qty 1

## 2020-10-05 MED ORDER — SODIUM CHLORIDE 0.9 % IV SOLN
500.0000 mg | Freq: Every day | INTRAVENOUS | Status: DC
  Administered 2020-10-05: 500 mg via INTRAVENOUS
  Filled 2020-10-05 (×2): qty 500

## 2020-10-05 MED ORDER — TAMSULOSIN HCL 0.4 MG PO CAPS
0.4000 mg | ORAL_CAPSULE | Freq: Every day | ORAL | Status: DC
  Administered 2020-10-05: 0.4 mg via ORAL
  Filled 2020-10-05: qty 1

## 2020-10-05 MED ORDER — LEVOFLOXACIN 500 MG PO TABS: 500.0000 mg | ORAL_TABLET | Freq: Every day | ORAL | Status: DC

## 2020-10-05 NOTE — Progress Notes (Addendum)
PROGRESS NOTE    JENE ORAVEC  XFG:[id number] DOB: 1966-01-21 DOA: 10/03/2020 PCP: Abner Greenspan, MD    Chief Complaint  Patient presents with  . Chest Pain  . hemoptysis  . Diarrhea  . Shortness of Breath    Brief Narrative:  54 year old malewith medical history significant ofsevere hiatal hernia status post repair, multiple episodes of pneumothorax,at least 5 times, multiple surgeries in [place] where he follows up with his thoracic surgeon,  hyperlipidemia, hypertension, GERD, who presented to the ER  for persistent nausea and vomiting.  Pt seen and examined. He reports still nauseated, able to tolerate soft diet without vomiting, he reports persistent chest pain on inspiration.  EKG shows sinus tachycardia.  He does not appear to be in distress. But constantly requesting for IV pain meds, iv anti emetics, benadryl .     Assessment & Plan:   Principal Problem:   Intractable nausea and vomiting Active Problems:   Severe protein-calorie malnutrition Lily Kocher: less than 60% of standard weight) (HCC)   HTN (hypertension)   FTT (failure to thrive) in adult   Multiple episodes of pneumothorax with chest pain on inspiration and occasional hemoptysis: Pain control with oxy, IV morphine and IV toradol for breakthrough pain.  CT chest shows Interval decrease in the size of the left upper lobe airspace opacity and fluid collection in the left upper lobe as well as decrease in the size of the fluid and air in the left pleural space compared to prior CT. But on exam this afternoon, pt reports recurrent chest pain, though his vs were wnl.     Nausea, vomiting:  Improving. No vomiting.  Resume gentle hydration. Prn anti emetics. He is able to tolerate soft diet without vomiting, but persistently nauseated.  UDS is positive for opiates.    Hypokalemia:  Replaced.     Severe protein calorie malnutrition:  Dietary consulted.    Failure to thrive:  Dietary consult and  PT evaluation ordered.    GERD Stable.    Hemoptysis - intermittent, none today.   Anemia of blood loss from hemoptysis vs from anemia of hemodilution.  On admission his hemoglobin around 12.6 dropped to 11.4 and 9.9 yesterday. rpeat hemoglobin tonight.  Transfuse to keep hemoglobin greater than 7.    DVT prophylaxis: (scd's Code Status: (Full Code) Family Communication: none at bedside.  Disposition:   Status is: Observation  The patient will require care spanning > 2 midnights and should be moved to inpatient because: Ongoing diagnostic testing needed not appropriate for outpatient work up and IV treatments appropriate due to intensity of illness or inability to take PO  Dispo: The patient is from: Home              Anticipated d/c is to: Home              Anticipated d/c date is: 1 day              Patient currently is medically stable to d/c.       Consultants:   None.    Procedures: none.    Antimicrobials: none.    Subjective: Pt reports nauseated, no vomiting. And left sided chest pain on inspiration.   Objective: Vitals:   10/04/20 1928 10/04/20 2254 10/05/20 0316 10/05/20 1341  BP: 98/69 95/79 103/78 113/79  Pulse: 75 72 66 79  Resp: 16 15 15 12   Temp: 98.3 F (36.8 C) 98.4 F (36.9 C) 98.2 F (36.8 C) 98.6  F (37 C)  TempSrc: Oral Oral Oral Oral  SpO2: 100% 97% 97% 96%  Weight:      Height:        Intake/Output Summary (Last 24 hours) at 10/05/2020 1625 Last data filed at 10/05/2020 0216 Gross per 24 hour  Intake 1302.07 ml  Output --  Net 1302.07 ml   Filed Weights   10/03/20 1747 10/04/20 1500  Weight: 56.2 kg 51.6 kg    Examination:  General exam: Appears calm and comfortable  Respiratory system: diminished air entry on the left side.  Cardiovascular system: S1 & S2 heard, RRR.  Gastrointestinal system: Abdomen is nondistended, soft and nontender.  Normal bowel sounds heard. Central nervous system: Alert and oriented. No  focal neurological deficits. Extremities: No pedal edema.  Skin: No rashes seen.  Psychiatry: Mood is appropriate.     Data Reviewed: I have personally reviewed following labs and imaging studies  CBC: Recent Labs  Lab 10/03/20 1845 10/04/20 0120 10/04/20 0516  WBC 5.6 4.5 4.2  HGB 12.6* 11.4* 9.9*  HCT 37.0* 33.9* 30.0*  MCV 94.1 94.7 95.8  PLT 283 257 263    Basic Metabolic Panel: Recent Labs  Lab 10/03/20 1845 10/04/20 0120 10/04/20 0516 10/04/20 1020  NA 136  --  136 135  K 3.8  --  3.6 3.4*  CL 100  --  103 106  CO2 24  --  23 22  GLUCOSE 125*  --  778* 216*  BUN 12  --  10 11  CREATININE 0.71 0.70 0.73 0.73  CALCIUM 8.9  --  7.9* 7.7*    GFR: Estimated Creatinine Clearance: 77.9 mL/min (by C-G formula based on SCr of 0.73 mg/dL).  Liver Function Tests: Recent Labs  Lab 10/04/20 0516  AST 20  ALT 29  ALKPHOS 59  BILITOT 0.6  PROT 4.6*  ALBUMIN 2.6*    CBG: Recent Labs  Lab 10/04/20 0701  GLUCAP 84     Recent Results (from the past 240 hour(s))  Resp Panel by RT-PCR (Flu A&B, Covid) Nasopharyngeal Swab     Status: None   Collection Time: 10/03/20 11:35 PM   Specimen: Nasopharyngeal Swab; Nasopharyngeal(NP) swabs in vial transport medium  Result Value Ref Range Status   SARS Coronavirus 2 by RT PCR NEGATIVE NEGATIVE Final    Comment: (NOTE) SARS-CoV-2 target nucleic acids are NOT DETECTED.  The SARS-CoV-2 RNA is generally detectable in upper respiratory specimens during the acute phase of infection. The lowest concentration of SARS-CoV-2 viral copies this assay can detect is 138 copies/mL. A negative result does not preclude SARS-Cov-2 infection and should not be used as the sole basis for treatment or other patient management decisions. A negative result may occur with  improper specimen collection/handling, submission of specimen other than nasopharyngeal swab, presence of viral mutation(s) within the areas targeted by this assay,  and inadequate number of viral copies(<138 copies/mL). A negative result must be combined with clinical observations, patient history, and epidemiological information. The expected result is Negative.  Fact Sheet for Patients:  BloggerCourse.comhttps://www.fda.gov/media/152166/download  Fact Sheet for Healthcare Providers:  SeriousBroker.ithttps://www.fda.gov/media/152162/download  This test is no t yet approved or cleared by the [place] FDA and  has been authorized for detection and/or diagnosis of SARS-CoV-2 by FDA under an Emergency Use Authorization (EUA). This EUA will remain  in effect (meaning this test can be used) for the duration of the COVID-19 declaration under Section 564(b)(1) of the Act, 21 U.S.C.section 360bbb-3(b)(1), unless the authorization is  terminated  or revoked sooner.       Influenza A by PCR NEGATIVE NEGATIVE Final   Influenza B by PCR NEGATIVE NEGATIVE Final    Comment: (NOTE) The Xpert Xpress SARS-CoV-2/FLU/RSV plus assay is intended as an aid in the diagnosis of influenza from Nasopharyngeal swab specimens and should not be used as a sole basis for treatment. Nasal washings and aspirates are unacceptable for Xpert Xpress SARS-CoV-2/FLU/RSV testing.  Fact Sheet for Patients: BloggerCourse.com  Fact Sheet for Healthcare Providers: SeriousBroker.it  This test is not yet approved or cleared by the [place] FDA and has been authorized for detection and/or diagnosis of SARS-CoV-2 by FDA under an Emergency Use Authorization (EUA). This EUA will remain in effect (meaning this test can be used) for the duration of the COVID-19 declaration under Section 564(b)(1) of the Act, 21 U.S.C. section 360bbb-3(b)(1), unless the authorization is terminated or revoked.  Performed at [facility], [address] [place], [place] [address]          Radiology Studies: CT ABDOMEN PELVIS WO CONTRAST  Result  Date: 10/03/2020 CLINICAL DATA:  54 year old male with chest pain and shortness of breath. Hemoptysis. Recent pleurodesis. EXAM: CT CHEST, ABDOMEN AND PELVIS WITHOUT CONTRAST TECHNIQUE: Multidetector CT imaging of the chest, abdomen and pelvis was performed following the standard protocol without IV contrast. COMPARISON:  Chest CT dated 09/29/2020 and CT abdomen pelvis dated 06/26/2020. FINDINGS: Evaluation of this exam is limited in the absence of intravenous contrast. CT CHEST FINDINGS Cardiovascular: There is no cardiomegaly. Small pericardial effusion measuring 6 mm in thickness anterior to the heart. The thoracic aorta is unremarkable. The central pulmonary arteries are grossly unremarkable on this noncontrast CT. Mediastinum/Nodes: No hilar or mediastinal adenopathy. There is a small hiatal hernia. Fluid noted within the esophagus, likely reflux. No mediastinal fluid collection. Right-sided Port-A-Cath with tip at the cavoatrial junction. Lungs/Pleura: There is patchy area of ground-glass density in the left upper lobe and lingula, improved since the prior CT. Lobulated high attenuating intraparenchymal fluid in the left upper lobe appears slightly smaller measuring approximately 3.4 x 1.7 cm in greatest axial dimensions (previously 5.0 x 2.1 cm). Interval decrease in the left pleural effusion and fluid along the left fissure. Decrease in the size of left apical and left lateral pleural air. The right lung is clear. The central airways are patent. There is overall volume loss related to prior resection of the basal segment of the left lower lobe. There is associated eventration of the left hemidiaphragm. Musculoskeletal: Left seventh rib thoracotomy defect. No acute osseous pathology. CT ABDOMEN PELVIS FINDINGS No intra-abdominal free air or free fluid. Hepatobiliary: The liver is unremarkable. No intrahepatic biliary dilatation. Cholecystectomy. Pancreas: Unremarkable. No pancreatic ductal dilatation or  surrounding inflammatory changes. Spleen: Normal in size without focal abnormality. Adrenals/Urinary Tract: The adrenal glands unremarkable. The kidneys, visualized ureters, and urinary bladder appear unremarkable. Stomach/Bowel: Postsurgical changes of the stomach. There is no bowel obstruction or active inflammation. Appendectomy. Vascular/Lymphatic: Mild aortoiliac atherosclerotic disease. The IVC is unremarkable. No portal venous gas. There is no adenopathy. Reproductive: The prostate and seminal vesicles are grossly unremarkable. No pelvic mass. Other: Midline vertical anterior pelvic wall incisional scar. Musculoskeletal: No acute or significant osseous findings. IMPRESSION: 1. Interval decrease in the size of the left upper lobe airspace opacity and fluid collection in the left upper lobe as well as decrease in the size of the fluid and air in the left pleural space compared to prior CT. 2.  No acute intra-abdominal or pelvic pathology. 3. Aortic Atherosclerosis (ICD10-I70.0). Electronically Signed   By: Elgie Collard M.D.   On: 10/03/2020 22:05   DG Chest 2 View  Result Date: 10/03/2020 CLINICAL DATA:  Pleurodesis September 24, 2020. Hemoptysis beginning 2 days ago. EXAM: CHEST - 2 VIEW COMPARISON:  October 01, 2020 FINDINGS: The right Port-A-Cath is in good position. No pneumothorax. The right lung is clear. A pleural effusion is again identified on the left. Masslike opacity in the periphery of the left mid lung is stable. The cardiomediastinal silhouette is stable. No other acute abnormalities. IMPRESSION: No interval changes. The masslike opacity in the periphery of the left upper lung is stable. Stable left effusion and elevation left hemidiaphragm. Electronically Signed   By: Gerome Sam III M.D   On: 10/03/2020 19:35   CT Chest Wo Contrast  Result Date: 10/03/2020 CLINICAL DATA:  54 year old male with chest pain and shortness of breath. Hemoptysis. Recent pleurodesis. EXAM: CT CHEST,  ABDOMEN AND PELVIS WITHOUT CONTRAST TECHNIQUE: Multidetector CT imaging of the chest, abdomen and pelvis was performed following the standard protocol without IV contrast. COMPARISON:  Chest CT dated 09/29/2020 and CT abdomen pelvis dated 06/26/2020. FINDINGS: Evaluation of this exam is limited in the absence of intravenous contrast. CT CHEST FINDINGS Cardiovascular: There is no cardiomegaly. Small pericardial effusion measuring 6 mm in thickness anterior to the heart. The thoracic aorta is unremarkable. The central pulmonary arteries are grossly unremarkable on this noncontrast CT. Mediastinum/Nodes: No hilar or mediastinal adenopathy. There is a small hiatal hernia. Fluid noted within the esophagus, likely reflux. No mediastinal fluid collection. Right-sided Port-A-Cath with tip at the cavoatrial junction. Lungs/Pleura: There is patchy area of ground-glass density in the left upper lobe and lingula, improved since the prior CT. Lobulated high attenuating intraparenchymal fluid in the left upper lobe appears slightly smaller measuring approximately 3.4 x 1.7 cm in greatest axial dimensions (previously 5.0 x 2.1 cm). Interval decrease in the left pleural effusion and fluid along the left fissure. Decrease in the size of left apical and left lateral pleural air. The right lung is clear. The central airways are patent. There is overall volume loss related to prior resection of the basal segment of the left lower lobe. There is associated eventration of the left hemidiaphragm. Musculoskeletal: Left seventh rib thoracotomy defect. No acute osseous pathology. CT ABDOMEN PELVIS FINDINGS No intra-abdominal free air or free fluid. Hepatobiliary: The liver is unremarkable. No intrahepatic biliary dilatation. Cholecystectomy. Pancreas: Unremarkable. No pancreatic ductal dilatation or surrounding inflammatory changes. Spleen: Normal in size without focal abnormality. Adrenals/Urinary Tract: The adrenal glands unremarkable. The  kidneys, visualized ureters, and urinary bladder appear unremarkable. Stomach/Bowel: Postsurgical changes of the stomach. There is no bowel obstruction or active inflammation. Appendectomy. Vascular/Lymphatic: Mild aortoiliac atherosclerotic disease. The IVC is unremarkable. No portal venous gas. There is no adenopathy. Reproductive: The prostate and seminal vesicles are grossly unremarkable. No pelvic mass. Other: Midline vertical anterior pelvic wall incisional scar. Musculoskeletal: No acute or significant osseous findings. IMPRESSION: 1. Interval decrease in the size of the left upper lobe airspace opacity and fluid collection in the left upper lobe as well as decrease in the size of the fluid and air in the left pleural space compared to prior CT. 2. No acute intra-abdominal or pelvic pathology. 3. Aortic Atherosclerosis (ICD10-I70.0). Electronically Signed   By: Elgie Collard M.D.   On: 10/03/2020 22:05        Scheduled Meds: . aspirin EC  81 mg  Oral Daily  . Chlorhexidine Gluconate Cloth  6 each Topical Daily  . cholecalciferol  250 mcg Oral Daily  . enoxaparin (LOVENOX) injection  40 mg Subcutaneous Q24H  . ferrous sulfate  325 mg Oral Daily  . gabapentin  300 mg Oral Daily  . mirtazapine  15 mg Oral QHS  . multivitamin with minerals  1 tablet Oral Daily  . pantoprazole  40 mg Oral BID  . rosuvastatin  5 mg Oral Weekly  . sucralfate  1 g Oral BID  . tamsulosin  0.4 mg Oral QPC supper  . vitamin B-12   Oral Daily  . zinc sulfate  220 mg Oral Daily   Continuous Infusions:   LOS: 0 days        Kathlen Mody, MD [facility]   To contact the attending provider between 7A-7P or the covering provider during after hours 7P-7A, please log into the web site [email or web address] and access using universal  password for that web site. If you do not have the password, please call the hospital operator.  10/05/2020, 4:25 PM

## 2020-10-05 NOTE — Progress Notes (Signed)
Patient would like to be restarted on his flomax today. Please speak with patient about it this am. Thanks! Priscille Kluver

## 2020-10-06 DIAGNOSIS — R627 Adult failure to thrive: Secondary | ICD-10-CM

## 2020-10-06 DIAGNOSIS — J90 Pleural effusion, not elsewhere classified: Secondary | ICD-10-CM | POA: Diagnosis present

## 2020-10-06 DIAGNOSIS — D500 Iron deficiency anemia secondary to blood loss (chronic): Secondary | ICD-10-CM | POA: Diagnosis present

## 2020-10-06 DIAGNOSIS — R042 Hemoptysis: Secondary | ICD-10-CM | POA: Diagnosis present

## 2020-10-06 DIAGNOSIS — Z91041 Radiographic dye allergy status: Secondary | ICD-10-CM | POA: Diagnosis not present

## 2020-10-06 DIAGNOSIS — Z681 Body mass index (BMI) 19 or less, adult: Secondary | ICD-10-CM | POA: Diagnosis not present

## 2020-10-06 DIAGNOSIS — R112 Nausea with vomiting, unspecified: Secondary | ICD-10-CM | POA: Diagnosis not present

## 2020-10-06 DIAGNOSIS — E43 Unspecified severe protein-calorie malnutrition: Secondary | ICD-10-CM | POA: Diagnosis present

## 2020-10-06 DIAGNOSIS — K589 Irritable bowel syndrome without diarrhea: Secondary | ICD-10-CM | POA: Diagnosis present

## 2020-10-06 DIAGNOSIS — E785 Hyperlipidemia, unspecified: Secondary | ICD-10-CM | POA: Diagnosis present

## 2020-10-06 DIAGNOSIS — R0602 Shortness of breath: Secondary | ICD-10-CM | POA: Diagnosis present

## 2020-10-06 DIAGNOSIS — J939 Pneumothorax, unspecified: Secondary | ICD-10-CM | POA: Diagnosis present

## 2020-10-06 DIAGNOSIS — Z8249 Family history of ischemic heart disease and other diseases of the circulatory system: Secondary | ICD-10-CM | POA: Diagnosis not present

## 2020-10-06 DIAGNOSIS — Z9049 Acquired absence of other specified parts of digestive tract: Secondary | ICD-10-CM | POA: Diagnosis not present

## 2020-10-06 DIAGNOSIS — K219 Gastro-esophageal reflux disease without esophagitis: Secondary | ICD-10-CM | POA: Diagnosis present

## 2020-10-06 DIAGNOSIS — Z888 Allergy status to other drugs, medicaments and biological substances status: Secondary | ICD-10-CM | POA: Diagnosis not present

## 2020-10-06 DIAGNOSIS — E876 Hypokalemia: Secondary | ICD-10-CM | POA: Diagnosis not present

## 2020-10-06 DIAGNOSIS — R109 Unspecified abdominal pain: Secondary | ICD-10-CM | POA: Diagnosis present

## 2020-10-06 DIAGNOSIS — Z20822 Contact with and (suspected) exposure to covid-19: Secondary | ICD-10-CM | POA: Diagnosis present

## 2020-10-06 DIAGNOSIS — Z7982 Long term (current) use of aspirin: Secondary | ICD-10-CM | POA: Diagnosis not present

## 2020-10-06 DIAGNOSIS — Z931 Gastrostomy status: Secondary | ICD-10-CM | POA: Diagnosis not present

## 2020-10-06 DIAGNOSIS — R197 Diarrhea, unspecified: Secondary | ICD-10-CM | POA: Diagnosis present

## 2020-10-06 DIAGNOSIS — I10 Essential (primary) hypertension: Secondary | ICD-10-CM | POA: Diagnosis present

## 2020-10-06 DIAGNOSIS — Z79899 Other long term (current) drug therapy: Secondary | ICD-10-CM | POA: Diagnosis not present

## 2020-10-06 LAB — COMPREHENSIVE METABOLIC PANEL
ALT: 26 U/L (ref 0–44)
AST: 21 U/L (ref 15–41)
Albumin: 2.8 g/dL — ABNORMAL LOW (ref 3.5–5.0)
Alkaline Phosphatase: 58 U/L (ref 38–126)
Anion gap: 9 (ref 5–15)
BUN: 7 mg/dL (ref 6–20)
CO2: 24 mmol/L (ref 22–32)
Calcium: 8 mg/dL — ABNORMAL LOW (ref 8.9–10.3)
Chloride: 106 mmol/L (ref 98–111)
Creatinine, Ser: 0.81 mg/dL (ref 0.61–1.24)
GFR, Estimated: >60 mL/min (ref >60)
Glucose, Bld: 128 mg/dL — ABNORMAL HIGH (ref 70–99)
Potassium: 3.7 mmol/L (ref 3.5–5.1)
Sodium: 139 mmol/L (ref 135–145)
Total Bilirubin: 0.5 mg/dL (ref 0.3–1.2)
Total Protein: 5.2 g/dL — ABNORMAL LOW (ref 6.5–8.1)

## 2020-10-06 LAB — CBC
HCT: 32.2 % — ABNORMAL LOW (ref 39.0–52.0)
Hemoglobin: 10.5 g/dL — ABNORMAL LOW (ref 13.0–17.0)
MCH: 31.8 pg (ref 26.0–34.0)
MCHC: 32.6 g/dL (ref 30.0–36.0)
MCV: 97.6 fL (ref 80.0–100.0)
Platelets: 238 10*3/uL (ref 150–400)
RBC: 3.3 MIL/uL — ABNORMAL LOW (ref 4.22–5.81)
RDW: 12.5 % (ref 11.5–15.5)
WBC: 3.5 10*3/uL — ABNORMAL LOW (ref 4.0–10.5)
nRBC: 0 % (ref 0.0–0.2)

## 2020-10-06 MED ORDER — AMOXICILLIN-POT CLAVULANATE 875-125 MG PO TABS: 1.0000 | ORAL_TABLET | Freq: Two times a day (BID) | ORAL | 0 refills | Status: AC

## 2020-10-06 MED ORDER — HEPARIN SOD (PORK) LOCK FLUSH 100 UNIT/ML IV SOLN
500.0000 [IU] | INTRAVENOUS | Status: DC | PRN
  Filled 2020-10-06: qty 5

## 2020-10-06 NOTE — Evaluation (Signed)
Physical Therapy Evaluation Patient Details Name: Edwin Hall MRN: [id number] DOB: October 17, 1966 Today's Date: 10/06/2020   History of Present Illness  54yo male with recent history of multiple surgeries followed by multiple pneumothoraxes, who received pleurodesis about one week ago and has been having hemoptysis and nausea/vomiting since surgery. PMH HA, hx hernia s/p surgical repair, HLD, IBS, pleurodesis  Clinical Impression   Patient received in bed, pleasant and reports he is moving well, cooperative with therapy. Able to mobilize on an independent- modified independent basis with IV pole, antalgic due to history of recent surgeries but steady and safe. Reports this is generally his baseline and he has no concerns with mobility. Politely declines up to chair, and was left in bed with all needs met. PT signing off- thank you for the opportunity to participate in his care!     Follow Up Recommendations No PT follow up    Equipment Recommendations  None recommended by PT    Recommendations for Other Services       Precautions / Restrictions Precautions Precautions: Other (comment) Precaution Comments: recent hernia surgery, recent pleurodesis with incision L chest wall Restrictions Weight Bearing Restrictions: No      Mobility  Bed Mobility Overal bed mobility: Independent             General bed mobility comments: with HOB elevated    Transfers Overall transfer level: Independent Equipment used: None             General transfer comment: distant S for safety, no physical assist given or safety deficit noted  Ambulation/Gait Ambulation/Gait assistance: Modified independent (Device/Increase time) Gait Distance (Feet): 200 Feet Assistive device: IV Pole Gait Pattern/deviations: WFL(Within Functional Limits);Step-through pattern Gait velocity: decreased   General Gait Details: slow but steady with IV pole, no need for guarding or physical  assistance  Stairs            Wheelchair Mobility    Modified Rankin (Stroke Patients Only)       Balance Overall balance assessment: No apparent balance deficits (not formally assessed)                                           Pertinent Vitals/Pain Pain Assessment: 0-10 Pain Score: 3  Pain Location: incision site Pain Descriptors / Indicators: Sore;Discomfort Pain Intervention(s): Limited activity within patient's tolerance;Monitored during session    Home Living Family/patient expects to be discharged to:: Private residence Living Arrangements: Alone Available Help at Discharge: Other (Comment) (sister lives in area) Type of Home: Apartment Home Access: Level entry     Home Layout: One level Home Equipment: None      Prior Function Level of Independence: Independent         Comments: works as an advanced CMT on an ambulance     Journalist, newspaper        Extremity/Trunk Assessment   Upper Extremity Assessment Upper Extremity Assessment: Overall WFL for tasks assessed    Lower Extremity Assessment Lower Extremity Assessment: Overall WFL for tasks assessed    Cervical / Trunk Assessment Cervical / Trunk Assessment: Normal  Communication   Communication: No difficulties  Cognition Arousal/Alertness: Awake/alert Behavior During Therapy: WFL for tasks assessed/performed Overall Cognitive Status: Within Functional Limits for tasks assessed  General Comments: A&Ox4, very pleasant and cooperative      General Comments      Exercises     Assessment/Plan    PT Assessment Patent does not need any further PT services  PT Problem List         PT Treatment Interventions      PT Goals (Current goals can be found in the Care Plan section)  Acute Rehab PT Goals Patient Stated Goal: less pain, go home PT Goal Formulation: With patient Time For Goal Achievement: 10/20/20 Potential  to Achieve Goals: Good    Frequency     Barriers to discharge        Co-evaluation               AM-PAC PT "6 Clicks" Mobility  Outcome Measure Help needed turning from your back to your side while in a flat bed without using bedrails?: None Help needed moving from lying on your back to sitting on the side of a flat bed without using bedrails?: None Help needed moving to and from a bed to a chair (including a wheelchair)?: None Help needed standing up from a chair using your arms (e.g., wheelchair or bedside chair)?: None Help needed to walk in hospital room?: None Help needed climbing 3-5 steps with a railing? : A Little 6 Click Score: 23    End of Session   Activity Tolerance: Patient tolerated treatment well Patient left: in bed;with call bell/phone within reach (refused up to chair) Nurse Communication: Mobility status PT Visit Diagnosis: Pain Pain - Right/Left: Left Pain - part of body:  (chest at incision site)    Time: 7556-2392 PT Time Calculation (min) (ACUTE ONLY): 11 min   Charges:   PT Evaluation $PT Eval Moderate Complexity: 1 Mod          Edwin Hall, DPT, PN1   Supplemental Physical Therapist [place]    Pager [phone] Acute Rehab Office [phone]

## 2020-10-07 NOTE — Discharge Summary (Signed)
Physician Discharge Summary  Edwin Hall WJX:[id number] DOB: 1965-12-15 DOA: 10/03/2020  PCP: Abner Greenspan, MD  Admit date: 10/03/2020 Discharge date: 10/06/2020  Admitted From: Home Disposition:  Home.   Recommendations for Outpatient Follow-up:  1. Follow up with PCP in 1-2 weeks 2. Please obtain BMP/CBC in one week Please follow up with cardiothoracic surgery on Wednesday.   Discharge Condition:stable.  CODE STATUS: FULL Diet recommendation: Heart Healthy   Brief/Interim Summary: 54 year old malewith medical history significant ofsevere hiatal hernia status post repair, multiple episodes of pneumothorax,at least 5 times, multiple surgeries in [place] where he follows up with his thoracic surgeon,  hyperlipidemia, hypertension, GERD, who presented to the ER for persistent nausea and vomiting.  Unclear etiology of his nausea, vomiting, but resolved with symptomatic management.   Discharge Diagnoses:  Principal Problem:   Intractable nausea and vomiting Active Problems:   Nausea & vomiting   Severe protein-calorie malnutrition Lily Kocher: less than 60% of standard weight) (HCC)   HTN (hypertension)   FTT (failure to thrive) in adult  Multiple episodes of pneumothorax with chest pain on inspiration and occasional hemoptysis: Pain control with oxy, IV morphine and IV toradol for breakthrough pain.  CT chest shows Interval decrease in the size of the left upper lobe airspace opacity and fluid collection in the left upper lobe as well as decrease in the size of the fluid and air in the left pleural space compared to prior CT. He was empirically started on antibiotics for possible early pneumonia and discharged on oral antibiotics to complete the course.     Nausea, vomiting:  Improving. No vomiting.  Resume gentle hydration. Prn anti emetics. He is able to tolerate soft diet without vomiting, but slightly nauseated.  UDS is positive for opiates.    Hypokalemia:   Replaced.     Severe protein calorie malnutrition:  Dietary consulted.    Failure to thrive:  Dietary consult and PT evaluation ordered. No follow up needed.    GERD Stable.    Hemoptysis - intermittent, none in the last 48 hours.   anemia of hemodilution.  On admission his hemoglobin around 12.6 dropped to 11.4 and stabilized at 10. Probably from hemodilution.  Transfuse to keep hemoglobin greater than 7.     Discharge Instructions  Discharge Instructions    Diet - low sodium heart healthy   Complete by: As directed    Discharge instructions   Complete by: As directed    Please follow up with cardiothoracic surgery at [facility] on Wednesday.   Discharge wound care:   Complete by: As directed    Wound care as per your CT SURGERY recommendations.     Allergies as of 10/06/2020      Reactions   Contrast Media [iodinated Diagnostic Agents] Hives   Other Anaphylaxis   All seafood    Shellfish Allergy Anaphylaxis   Reglan [metoclopramide] Hives   Severe itching all over   Iodine Hives   Tetracyclines & Related Itching   Zofran [ondansetron Hcl] Itching, Rash      Medication List    STOP taking these medications   metoCLOPramide 10 MG tablet Commonly known as: REGLAN     TAKE these medications   amoxicillin-clavulanate 875-125 MG tablet Commonly known as: Augmentin Take 1 tablet by mouth every 12 (twelve) hours for 6 days.   aspirin 81 MG EC tablet Take 81 mg by mouth daily. Swallow whole.   gabapentin 300 MG capsule Commonly known as: NEURONTIN Take 300  mg by mouth daily.   Ginkgo Biloba 40 MG Tabs Take 40 mg by mouth daily.   ipratropium 0.06 % nasal spray Commonly known as: ATROVENT USE TWO SPRAYS IN EACH NOSTRIL TWICE DAILY AS DIRECTED. What changed: See the new instructions.   Iron 325 (65 Fe) MG Tabs Take 325 mg by mouth daily.   Linzess 290 MCG Caps capsule Generic drug: linaclotide Take 290 mcg by mouth daily as needed  (abdominal cramping).   mirtazapine 15 MG tablet Commonly known as: REMERON Take 15 mg by mouth at bedtime.   multivitamin with minerals Tabs tablet Take 1 tablet by mouth daily.   oxyCODONE 5 MG immediate release tablet Commonly known as: Oxy IR/ROXICODONE Take 1 tablet (5 mg total) by mouth every 6 (six) hours as needed for up to 10 doses for severe pain.   pantoprazole 40 MG tablet Commonly known as: PROTONIX Take 1 tablet (40 mg total) by mouth 2 (two) times daily.   promethazine 25 MG tablet Commonly known as: PHENERGAN Take 1 tablet (25 mg total) by mouth every 6 (six) hours as needed for up to 7 days for nausea or vomiting.   rosuvastatin 5 MG tablet Commonly known as: CRESTOR Take 5 mg by mouth once a week.   sucralfate 1 g tablet Commonly known as: Carafate Take 1 tablet (1 g total) by mouth 2 (two) times daily for 14 days.   traZODone 50 MG tablet Commonly known as: DESYREL Take 50 mg by mouth at bedtime as needed for sleep.   VITAMIN B 12 PO Take 1 capsule by mouth daily.   VITAMIN D3 PO Take 250 mcg by mouth daily.   Zinc 50 MG Tabs Take 50 mg by mouth daily.            Discharge Care Instructions  (From admission, onward)         Start     Ordered   10/06/20 0000  Discharge wound care:       Comments: Wound care as per your CT SURGERY recommendations.   10/06/20 1417          Allergies  Allergen Reactions  . Contrast Media [Iodinated Diagnostic Agents] Hives  . Other Anaphylaxis    All seafood   . Shellfish Allergy Anaphylaxis  . Reglan [Metoclopramide] Hives    Severe itching all over  . Iodine Hives  . Tetracyclines & Related Itching  . Zofran [Ondansetron Hcl] Itching and Rash    Consultations: None.   Procedures/Studies: CT ABDOMEN PELVIS WO CONTRAST  Result Date: 10/03/2020 CLINICAL DATA:  54 year old male with chest pain and shortness of breath. Hemoptysis. Recent pleurodesis. EXAM: CT CHEST, ABDOMEN AND PELVIS  WITHOUT CONTRAST TECHNIQUE: Multidetector CT imaging of the chest, abdomen and pelvis was performed following the standard protocol without IV contrast. COMPARISON:  Chest CT dated 09/29/2020 and CT abdomen pelvis dated 06/26/2020. FINDINGS: Evaluation of this exam is limited in the absence of intravenous contrast. CT CHEST FINDINGS Cardiovascular: There is no cardiomegaly. Small pericardial effusion measuring 6 mm in thickness anterior to the heart. The thoracic aorta is unremarkable. The central pulmonary arteries are grossly unremarkable on this noncontrast CT. Mediastinum/Nodes: No hilar or mediastinal adenopathy. There is a small hiatal hernia. Fluid noted within the esophagus, likely reflux. No mediastinal fluid collection. Right-sided Port-A-Cath with tip at the cavoatrial junction. Lungs/Pleura: There is patchy area of ground-glass density in the left upper lobe and lingula, improved since the prior CT. Lobulated high attenuating intraparenchymal fluid  in the left upper lobe appears slightly smaller measuring approximately 3.4 x 1.7 cm in greatest axial dimensions (previously 5.0 x 2.1 cm). Interval decrease in the left pleural effusion and fluid along the left fissure. Decrease in the size of left apical and left lateral pleural air. The right lung is clear. The central airways are patent. There is overall volume loss related to prior resection of the basal segment of the left lower lobe. There is associated eventration of the left hemidiaphragm. Musculoskeletal: Left seventh rib thoracotomy defect. No acute osseous pathology. CT ABDOMEN PELVIS FINDINGS No intra-abdominal free air or free fluid. Hepatobiliary: The liver is unremarkable. No intrahepatic biliary dilatation. Cholecystectomy. Pancreas: Unremarkable. No pancreatic ductal dilatation or surrounding inflammatory changes. Spleen: Normal in size without focal abnormality. Adrenals/Urinary Tract: The adrenal glands unremarkable. The kidneys,  visualized ureters, and urinary bladder appear unremarkable. Stomach/Bowel: Postsurgical changes of the stomach. There is no bowel obstruction or active inflammation. Appendectomy. Vascular/Lymphatic: Mild aortoiliac atherosclerotic disease. The IVC is unremarkable. No portal venous gas. There is no adenopathy. Reproductive: The prostate and seminal vesicles are grossly unremarkable. No pelvic mass. Other: Midline vertical anterior pelvic wall incisional scar. Musculoskeletal: No acute or significant osseous findings. IMPRESSION: 1. Interval decrease in the size of the left upper lobe airspace opacity and fluid collection in the left upper lobe as well as decrease in the size of the fluid and air in the left pleural space compared to prior CT. 2. No acute intra-abdominal or pelvic pathology. 3. Aortic Atherosclerosis (ICD10-I70.0). Electronically Signed   By: Elgie CollardArash  Radparvar M.D.   On: 10/03/2020 22:05   DG Chest 2 View  Result Date: 10/03/2020 CLINICAL DATA:  Pleurodesis September 24, 2020. Hemoptysis beginning 2 days ago. EXAM: CHEST - 2 VIEW COMPARISON:  October 01, 2020 FINDINGS: The right Port-A-Cath is in good position. No pneumothorax. The right lung is clear. A pleural effusion is again identified on the left. Masslike opacity in the periphery of the left mid lung is stable. The cardiomediastinal silhouette is stable. No other acute abnormalities. IMPRESSION: No interval changes. The masslike opacity in the periphery of the left upper lung is stable. Stable left effusion and elevation left hemidiaphragm. Electronically Signed   By: Gerome Samavid  Williams III M.D   On: 10/03/2020 19:35   CT Chest Wo Contrast  Result Date: 10/03/2020 CLINICAL DATA:  54 year old male with chest pain and shortness of breath. Hemoptysis. Recent pleurodesis. EXAM: CT CHEST, ABDOMEN AND PELVIS WITHOUT CONTRAST TECHNIQUE: Multidetector CT imaging of the chest, abdomen and pelvis was performed following the standard protocol  without IV contrast. COMPARISON:  Chest CT dated 09/29/2020 and CT abdomen pelvis dated 06/26/2020. FINDINGS: Evaluation of this exam is limited in the absence of intravenous contrast. CT CHEST FINDINGS Cardiovascular: There is no cardiomegaly. Small pericardial effusion measuring 6 mm in thickness anterior to the heart. The thoracic aorta is unremarkable. The central pulmonary arteries are grossly unremarkable on this noncontrast CT. Mediastinum/Nodes: No hilar or mediastinal adenopathy. There is a small hiatal hernia. Fluid noted within the esophagus, likely reflux. No mediastinal fluid collection. Right-sided Port-A-Cath with tip at the cavoatrial junction. Lungs/Pleura: There is patchy area of ground-glass density in the left upper lobe and lingula, improved since the prior CT. Lobulated high attenuating intraparenchymal fluid in the left upper lobe appears slightly smaller measuring approximately 3.4 x 1.7 cm in greatest axial dimensions (previously 5.0 x 2.1 cm). Interval decrease in the left pleural effusion and fluid along the left fissure. Decrease in the  size of left apical and left lateral pleural air. The right lung is clear. The central airways are patent. There is overall volume loss related to prior resection of the basal segment of the left lower lobe. There is associated eventration of the left hemidiaphragm. Musculoskeletal: Left seventh rib thoracotomy defect. No acute osseous pathology. CT ABDOMEN PELVIS FINDINGS No intra-abdominal free air or free fluid. Hepatobiliary: The liver is unremarkable. No intrahepatic biliary dilatation. Cholecystectomy. Pancreas: Unremarkable. No pancreatic ductal dilatation or surrounding inflammatory changes. Spleen: Normal in size without focal abnormality. Adrenals/Urinary Tract: The adrenal glands unremarkable. The kidneys, visualized ureters, and urinary bladder appear unremarkable. Stomach/Bowel: Postsurgical changes of the stomach. There is no bowel  obstruction or active inflammation. Appendectomy. Vascular/Lymphatic: Mild aortoiliac atherosclerotic disease. The IVC is unremarkable. No portal venous gas. There is no adenopathy. Reproductive: The prostate and seminal vesicles are grossly unremarkable. No pelvic mass. Other: Midline vertical anterior pelvic wall incisional scar. Musculoskeletal: No acute or significant osseous findings. IMPRESSION: 1. Interval decrease in the size of the left upper lobe airspace opacity and fluid collection in the left upper lobe as well as decrease in the size of the fluid and air in the left pleural space compared to prior CT. 2. No acute intra-abdominal or pelvic pathology. 3. Aortic Atherosclerosis (ICD10-I70.0). Electronically Signed   By: Elgie Collard M.D.   On: 10/03/2020 22:05   DG CHEST PORT 1 VIEW  Result Date: 10/05/2020 CLINICAL DATA:  Left-sided chest pain. EXAM: PORTABLE CHEST 1 VIEW COMPARISON:  CT scan and chest x-ray October 03, 2020 FINDINGS: The masslike opacity in the left lateral mid lung appears to be similar in size although it is less distinct. There may be increased ground-glass surrounding this opacity. Elevation left hemidiaphragm again identified. Blunting left costophrenic angle again identified. The cardiomediastinal silhouette is stable. The right Port-A-Cath is stable. The right lung remains clear. No pneumothorax. IMPRESSION: 1. The masslike opacity in the left peripheral mid lung is less well-defined but overall appears stable in size. There appears to be increasing ground-glass around this masslike opacity partially obscuring it. The surrounding ground-glass could be infectious or inflammatory. Recommend clinical correlation and follow-up to resolution. Electronically Signed   By: Gerome Sam III M.D   On: 10/05/2020 16:49       Subjective:  No vomiting, no hemoptysis, slightly nauseated.  Discharge Exam: Vitals:   10/06/20 0420 10/06/20 1409  BP: 118/83 113/77  Pulse:  73 69  Resp: 16 20  Temp: 97.8 F (36.6 C) 98.2 F (36.8 C)  SpO2: 98% 99%   Vitals:   10/05/20 1341 10/05/20 2049 10/06/20 0420 10/06/20 1409  BP: 113/79 130/87 118/83 113/77  Pulse: 79 (!) 101 73 69  Resp: Temp: 98.6 F (37 C) 98.2 F (36.8 C) 97.8 F (36.6 C) 98.2 F (36.8 C)  TempSrc: Oral Oral Oral Oral  SpO2: 96% 98% 98% 99%  Weight: 55.3 kg     Height:        General: Pt is alert, awake, not in acute distress Cardiovascular: RRR, S1/S2 +, no rubs, no gallops Respiratory: CTA bilaterally, no wheezing, no rhonchi Abdominal: Soft, NT, ND, bowel sounds + Extremities: no edema, no cyanosis    The results of significant diagnostics from this hospitalization (including imaging, microbiology, ancillary and laboratory) are listed below for reference.     Microbiology: Recent Results (from the past 240 hour(s))  Resp Panel by RT-PCR (Flu A&B, Covid) Nasopharyngeal Swab  Status: None   Collection Time: 10/03/20 11:35 PM   Specimen: Nasopharyngeal Swab; Nasopharyngeal(NP) swabs in vial transport medium  Result Value Ref Range Status   SARS Coronavirus 2 by RT PCR NEGATIVE NEGATIVE Final    Comment: (NOTE) SARS-CoV-2 target nucleic acids are NOT DETECTED.  The SARS-CoV-2 RNA is generally detectable in upper respiratory specimens during the acute phase of infection. The lowest concentration of SARS-CoV-2 viral copies this assay can detect is 138 copies/mL. A negative result does not preclude SARS-Cov-2 infection and should not be used as the sole basis for treatment or other patient management decisions. A negative result may occur with  improper specimen collection/handling, submission of specimen other than nasopharyngeal swab, presence of viral mutation(s) within the areas targeted by this assay, and inadequate number of viral copies(<138 copies/mL). A negative result must be combined with clinical observations, patient history, and  epidemiological information. The expected result is Negative.  Fact Sheet for Patients:  BloggerCourse.com  Fact Sheet for Healthcare Providers:  SeriousBroker.it  This test is no t yet approved or cleared by the [place] FDA and  has been authorized for detection and/or diagnosis of SARS-CoV-2 by FDA under an Emergency Use Authorization (EUA). This EUA will remain  in effect (meaning this test can be used) for the duration of the COVID-19 declaration under Section 564(b)(1) of the Act, 21 U.S.C.section 360bbb-3(b)(1), unless the authorization is terminated  or revoked sooner.       Influenza A by PCR NEGATIVE NEGATIVE Final   Influenza B by PCR NEGATIVE NEGATIVE Final    Comment: (NOTE) The Xpert Xpress SARS-CoV-2/FLU/RSV plus assay is intended as an aid in the diagnosis of influenza from Nasopharyngeal swab specimens and should not be used as a sole basis for treatment. Nasal washings and aspirates are unacceptable for Xpert Xpress SARS-CoV-2/FLU/RSV testing.  Fact Sheet for Patients: BloggerCourse.com  Fact Sheet for Healthcare Providers: SeriousBroker.it  This test is not yet approved or cleared by the [place] FDA and has been authorized for detection and/or diagnosis of SARS-CoV-2 by FDA under an Emergency Use Authorization (EUA). This EUA will remain in effect (meaning this test can be used) for the duration of the COVID-19 declaration under Section 564(b)(1) of the Act, 21 U.S.C. section 360bbb-3(b)(1), unless the authorization is terminated or revoked.  Performed at [facility], [address], [place], [place] [address]      Labs: BNP (last 3 results) No results for input(s): BNP in the last 8760 hours. Basic Metabolic Panel: Recent Labs  Lab 10/03/20 1845 10/04/20 0120 10/04/20 0516 10/04/20 1020 10/06/20 0543  NA 136  --   136 135 139  K 3.8  --  3.6 3.4* 3.7  CL 100  --  103 106 106  CO2 24  --  GLUCOSE 125*  --  778* 216* 128*  BUN 12  --  CREATININE 0.71 0.70 0.73 0.73 0.81  CALCIUM 8.9  --  7.9* 7.7* 8.0*   Liver Function Tests: Recent Labs  Lab 10/04/20 0516 10/06/20 0543  AST 20 21  ALT 29 26  ALKPHOS 59 58  BILITOT 0.6 0.5  PROT 4.6* 5.2*  ALBUMIN 2.6* 2.8*   No results for input(s): LIPASE, AMYLASE in the last 168 hours. No results for input(s): AMMONIA in the last 168 hours. CBC: Recent Labs  Lab 10/03/20 1845 10/04/20 0120 10/04/20 0516 10/05/20 1814 10/06/20 0543  WBC 5.6 4.5 4.2  --  3.5*  HGB 12.6* 11.4* 9.9* 10.6* 10.5*  HCT 37.0* 33.9* 30.0* 32.3* 32.2*  MCV 94.1 94.7 95.8  --  97.6  PLT 283 257 263  --  238   Cardiac Enzymes: No results for input(s): CKTOTAL, CKMB, CKMBINDEX, TROPONINI in the last 168 hours. BNP: Invalid input(s): POCBNP CBG: Recent Labs  Lab 10/04/20 0701  GLUCAP 84   D-Dimer No results for input(s): DDIMER in the last 72 hours. Hgb A1c No results for input(s): HGBA1C in the last 72 hours. Lipid Profile No results for input(s): CHOL, HDL, LDLCALC, TRIG, CHOLHDL, LDLDIRECT in the last 72 hours. Thyroid function studies No results for input(s): TSH, T4TOTAL, T3FREE, THYROIDAB in the last 72 hours.  Invalid input(s): FREET3 Anemia work up No results for input(s): VITAMINB12, FOLATE, FERRITIN, TIBC, IRON, RETICCTPCT in the last 72 hours. Urinalysis    Component Value Date/Time   COLORURINE AMBER (A) 05/16/2020 1800   APPEARANCEUR CLEAR 05/16/2020 1800   LABSPEC 1.021 05/16/2020 1800   PHURINE 5.0 05/16/2020 1800   GLUCOSEU NEGATIVE 05/16/2020 1800   HGBUR NEGATIVE 05/16/2020 1800   BILIRUBINUR NEGATIVE 05/16/2020 1800   KETONESUR 20 (A) 05/16/2020 1800   PROTEINUR NEGATIVE 05/16/2020 1800   NITRITE NEGATIVE 05/16/2020 1800   LEUKOCYTESUR NEGATIVE 05/16/2020 1800   Sepsis Labs Invalid input(s): PROCALCITONIN,   WBC,  LACTICIDVEN Microbiology Recent Results (from the past 240 hour(s))  Resp Panel by RT-PCR (Flu A&B, Covid) Nasopharyngeal Swab     Status: None   Collection Time: 10/03/20 11:35 PM   Specimen: Nasopharyngeal Swab; Nasopharyngeal(NP) swabs in vial transport medium  Result Value Ref Range Status   SARS Coronavirus 2 by RT PCR NEGATIVE NEGATIVE Final    Comment: (NOTE) SARS-CoV-2 target nucleic acids are NOT DETECTED.  The SARS-CoV-2 RNA is generally detectable in upper respiratory specimens during the acute phase of infection. The lowest concentration of SARS-CoV-2 viral copies this assay can detect is 138 copies/mL. A negative result does not preclude SARS-Cov-2 infection and should not be used as the sole basis for treatment or other patient management decisions. A negative result may occur with  improper specimen collection/handling, submission of specimen other than nasopharyngeal swab, presence of viral mutation(s) within the areas targeted by this assay, and inadequate number of viral copies(<138 copies/mL). A negative result must be combined with clinical observations, patient history, and epidemiological information. The expected result is Negative.  Fact Sheet for Patients:  BloggerCourse.com  Fact Sheet for Healthcare Providers:  SeriousBroker.it  This test is no t yet approved or cleared by the [place] FDA and  has been authorized for detection and/or diagnosis of SARS-CoV-2 by FDA under an Emergency Use Authorization (EUA). This EUA will remain  in effect (meaning this test can be used) for the duration of the COVID-19 declaration under Section 564(b)(1) of the Act, 21 U.S.C.section 360bbb-3(b)(1), unless the authorization is terminated  or revoked sooner.       Influenza A by PCR NEGATIVE NEGATIVE Final   Influenza B by PCR NEGATIVE NEGATIVE Final    Comment: (NOTE) The Xpert Xpress SARS-CoV-2/FLU/RSV  plus assay is intended as an aid in the diagnosis of influenza from Nasopharyngeal swab specimens and should not be used as a sole basis for treatment. Nasal washings and aspirates are unacceptable for Xpert Xpress SARS-CoV-2/FLU/RSV testing.  Fact Sheet for Patients: BloggerCourse.com  Fact Sheet for Healthcare Providers: SeriousBroker.it  This test is not yet approved or cleared by the [place] and has been authorized for  detection and/or diagnosis of SARS-CoV-2 by FDA under an Emergency Use Authorization (EUA). This EUA will remain in effect (meaning this test can be used) for the duration of the COVID-19 declaration under Section 564(b)(1) of the Act, 21 U.S.C. section 360bbb-3(b)(1), unless the authorization is terminated or revoked.  Performed at [facility], [address] [place], [place] [address]      Time coordinating discharge: 35 minutes.   SIGNED:   Kathlen Mody, MD  [facility]

## 2020-10-08 ENCOUNTER — Encounter (HOSPITAL_COMMUNITY): Payer: Self-pay

## 2020-10-08 ENCOUNTER — Other Ambulatory Visit: Payer: Self-pay

## 2020-10-08 ENCOUNTER — Emergency Department (HOSPITAL_COMMUNITY)
Admission: EM | Admit: 2020-10-08 | Discharge: 2020-10-09 | Disposition: A | Discharge status: Home / Self Care | Payer: Medicare Other | Attending: Emergency Medicine | Admitting: Emergency Medicine

## 2020-10-08 ENCOUNTER — Emergency Department (HOSPITAL_COMMUNITY): Payer: Medicare Other

## 2020-10-08 DIAGNOSIS — R0789 Other chest pain: Secondary | ICD-10-CM | POA: Insufficient documentation

## 2020-10-08 DIAGNOSIS — Z79899 Other long term (current) drug therapy: Secondary | ICD-10-CM | POA: Insufficient documentation

## 2020-10-08 DIAGNOSIS — I10 Essential (primary) hypertension: Secondary | ICD-10-CM | POA: Insufficient documentation

## 2020-10-08 DIAGNOSIS — R197 Diarrhea, unspecified: Secondary | ICD-10-CM | POA: Diagnosis not present

## 2020-10-08 DIAGNOSIS — R1110 Vomiting, unspecified: Secondary | ICD-10-CM

## 2020-10-08 DIAGNOSIS — Z7982 Long term (current) use of aspirin: Secondary | ICD-10-CM | POA: Diagnosis not present

## 2020-10-08 DIAGNOSIS — K920 Hematemesis: Secondary | ICD-10-CM | POA: Diagnosis not present

## 2020-10-08 LAB — CBC WITH DIFFERENTIAL/PLATELET
Abs Immature Granulocytes: 0.02 10*3/uL (ref 0.00–0.07)
Basophils Absolute: 0 10*3/uL (ref 0.0–0.1)
Basophils Relative: 1 %
Eosinophils Absolute: 0 10*3/uL (ref 0.0–0.5)
Eosinophils Relative: 1 %
HCT: 35.2 % — ABNORMAL LOW (ref 39.0–52.0)
Hemoglobin: 12 g/dL — ABNORMAL LOW (ref 13.0–17.0)
Immature Granulocytes: 0 %
Lymphocytes Relative: 26 %
Lymphs Abs: 1.7 10*3/uL (ref 0.7–4.0)
MCH: 31.9 pg (ref 26.0–34.0)
MCHC: 34.1 g/dL (ref 30.0–36.0)
MCV: 93.6 fL (ref 80.0–100.0)
Monocytes Absolute: 0.5 10*3/uL (ref 0.1–1.0)
Monocytes Relative: 7 %
Neutro Abs: 4.2 10*3/uL (ref 1.7–7.7)
Neutrophils Relative %: 65 %
Platelets: 329 10*3/uL (ref 150–400)
RBC: 3.76 MIL/uL — ABNORMAL LOW (ref 4.22–5.81)
RDW: 12 % (ref 11.5–15.5)
WBC: 6.4 10*3/uL (ref 4.0–10.5)
nRBC: 0 % (ref 0.0–0.2)

## 2020-10-08 LAB — TYPE AND SCREEN
ABO/RH(D): A POS
Antibody Screen: NEGATIVE

## 2020-10-08 LAB — COMPREHENSIVE METABOLIC PANEL
ALT: 21 U/L (ref 0–44)
AST: 18 U/L (ref 15–41)
Albumin: 3.5 g/dL (ref 3.5–5.0)
Alkaline Phosphatase: 70 U/L (ref 38–126)
Anion gap: 10 (ref 5–15)
BUN: 10 mg/dL (ref 6–20)
CO2: 24 mmol/L (ref 22–32)
Calcium: 8.6 mg/dL — ABNORMAL LOW (ref 8.9–10.3)
Chloride: 101 mmol/L (ref 98–111)
Creatinine, Ser: 0.66 mg/dL (ref 0.61–1.24)
GFR, Estimated: >60 mL/min (ref >60)
Glucose, Bld: 104 mg/dL — ABNORMAL HIGH (ref 70–99)
Potassium: 3.5 mmol/L (ref 3.5–5.1)
Sodium: 135 mmol/L (ref 135–145)
Total Bilirubin: 0.8 mg/dL (ref 0.3–1.2)
Total Protein: 6.6 g/dL (ref 6.5–8.1)

## 2020-10-08 LAB — URINALYSIS, ROUTINE W REFLEX MICROSCOPIC
Bilirubin Urine: NEGATIVE
Glucose, UA: NEGATIVE mg/dL
Hgb urine dipstick: NEGATIVE
Ketones, ur: 5 mg/dL — AB
Leukocytes,Ua: NEGATIVE
Nitrite: NEGATIVE
Protein, ur: NEGATIVE mg/dL
Specific Gravity, Urine: 1.01 (ref 1.005–1.030)
pH: 6 (ref 5.0–8.0)

## 2020-10-08 LAB — ABO/RH: ABO/RH(D): A POS

## 2020-10-08 LAB — PROTIME-INR
INR: 1 (ref 0.8–1.2)
Prothrombin Time: 12.9 seconds (ref 11.4–15.2)

## 2020-10-08 LAB — POC OCCULT BLOOD, ED: Fecal Occult Bld: NEGATIVE

## 2020-10-08 LAB — LIPASE, BLOOD: Lipase: 30 U/L (ref 11–51)

## 2020-10-08 MED ORDER — LACTATED RINGERS IV BOLUS
1000.0000 mL | Freq: Once | INTRAVENOUS | Status: AC
  Administered 2020-10-08: 1000 mL via INTRAVENOUS

## 2020-10-08 MED ORDER — DIPHENHYDRAMINE HCL 50 MG/ML IJ SOLN
25.0000 mg | Freq: Once | INTRAMUSCULAR | Status: AC
  Administered 2020-10-08: 25 mg via INTRAVENOUS
  Filled 2020-10-08: qty 1

## 2020-10-08 MED ORDER — PROMETHAZINE HCL 25 MG/ML IJ SOLN
25.0000 mg | Freq: Once | INTRAMUSCULAR | Status: AC
  Administered 2020-10-08: 25 mg via INTRAVENOUS
  Filled 2020-10-08: qty 1

## 2020-10-08 MED ORDER — HEPARIN SOD (PORK) LOCK FLUSH 100 UNIT/ML IV SOLN
500.0000 [IU] | Freq: Once | INTRAVENOUS | Status: AC
  Administered 2020-10-08: 500 [IU]
  Filled 2020-10-08: qty 5

## 2020-10-08 MED ORDER — FENTANYL CITRATE (PF) 100 MCG/2ML IJ SOLN
100.0000 ug | Freq: Once | INTRAMUSCULAR | Status: AC
  Administered 2020-10-08: 100 ug via INTRAVENOUS
  Filled 2020-10-08: qty 2

## 2020-10-08 MED ORDER — DROPERIDOL 2.5 MG/ML IJ SOLN
2.5000 mg | Freq: Once | INTRAMUSCULAR | Status: AC
  Administered 2020-10-08: 2.5 mg via INTRAVENOUS
  Filled 2020-10-08: qty 2

## 2020-10-08 NOTE — ED Notes (Signed)
Pt given a gingerale

## 2020-10-08 NOTE — ED Triage Notes (Signed)
Pt arrived via walk in, c/o vomitting bright red and dark blood as well as blood in diarrhea x1 week. States recent pleurodesis 11/10, been having these problems since. States increasing fatigue as well.

## 2020-10-08 NOTE — Discharge Instructions (Signed)
If you develop worsening, continued, or recurrent abdominal pain, uncontrolled vomiting, fever, chest or back pain, or any other new/concerning symptoms then return to the ER for evaluation.  

## 2020-10-08 NOTE — ED Provider Notes (Signed)
[facility]-EMERGENCY DEPT Provider Note   CSN: [id number] Arrival date & time: 10/08/20  1736     History Chief Complaint  Patient presents with  . Hematemesis  . Diarrhea    Edwin Hall is a 54 y.o. male.  HPI 54 year old male presents with vomiting and diarrhea.  Started after he left the hospital a couple days ago.  Has been having numerous episodes of both, has had dark stools and blood in his emesis.  He states is dark red.  No fevers or abdominal pain.  Is having some chest pressure/heaviness.  Feels similar to when he was admitted a few days ago. Feels very weak.   Past Medical History:  Diagnosis Date  . Complication of anesthesia    nasuea and vomiting  . Food allergy   . Headache   . Hiatal hernia   . Hyperlipidemia   . IBS (irritable bowel syndrome)   . Iron deficiency anemia   . Urticaria     Patient Active Problem List   Diagnosis Date Noted  . FTT (failure to thrive) in adult 10/04/2020  . Intractable nausea and vomiting 10/04/2020  . Intractable vomiting 06/26/2020  . Severe protein-calorie malnutrition Lily Kocher(Gomez: less than 60% of standard weight) (HCC) 06/26/2020  . IBS (irritable bowel syndrome) 06/26/2020  . Dyslipidemia 06/26/2020  . HTN (hypertension) 06/26/2020  . Iron deficiency anemia due to dietary causes 06/26/2020  . Pneumothorax 05/15/2020  . Nausea & vomiting 05/15/2020  . Unintentional weight loss 05/15/2020    Past Surgical History:  Procedure Laterality Date  . ABDOMINAL SURGERY  12/2015   pylorus plasty  . APPENDECTOMY  1984  . BIOPSY  05/18/2020   Procedure: BIOPSY;  Surgeon: Kerin SalenKarki, Arya, MD;  Location: [facility];  Service: Gastroenterology;;  . Ephriam KnucklesHOLECYSTECTOMY  2008  . ESOPHAGOGASTRODUODENOSCOPY (EGD) WITH PROPOFOL N/A 05/18/2020   Procedure: ESOPHAGOGASTRODUODENOSCOPY (EGD) WITH PROPOFOL;  Surgeon: Kerin SalenKarki, Arya, MD;  Location: [facility];  Service: Gastroenterology;  Laterality: N/A;  . GASTROSTOMY  TUBE PLACEMENT    . HIATAL HERNIA REPAIR  07/2015  . TALC PLEURODESIS    . TONSILLECTOMY  1980  . VENTRAL HERNIA REPAIR  2010       Family History  Problem Relation Age of Onset  . Parkinson's disease Father   . Heart disease Father   . Angioedema Mother   . Allergic rhinitis Neg Hx   . Asthma Neg Hx   . Eczema Neg Hx   . Immunodeficiency Neg Hx   . Urticaria Neg Hx     Social History   Tobacco Use  . Smoking status: Never Smoker  . Smokeless tobacco: Never Used  Vaping Use  . Vaping Use: Never used  Substance Use Topics  . Alcohol use: No  . Drug use: No    Home Medications Prior to Admission medications   Medication Sig Start Date End Date Taking? Authorizing Provider  amoxicillin-clavulanate (AUGMENTIN) 875-125 MG tablet Take 1 tablet by mouth every 12 (twelve) hours for 6 days. 10/06/20 10/12/20 Yes Kathlen ModyAkula, Vijaya, MD  aspirin 81 MG EC tablet Take 81 mg by mouth daily. Swallow whole.   Yes [provider]  Cholecalciferol (VITAMIN D3 PO) Take 250 mcg by mouth daily.    Yes [provider]  Cyanocobalamin (VITAMIN B 12 PO) Take 1 capsule by mouth daily.   Yes [provider]  Ferrous Sulfate (IRON) 325 (65 Fe) MG TABS Take 325 mg by mouth daily.  05/12/20  Yes [provider]  gabapentin (NEURONTIN) 300 MG capsule Take 300 mg by mouth daily.  03/29/20  Yes [provider]  Ginkgo Biloba 40 MG TABS Take 40 mg by mouth daily.    Yes [provider]  ipratropium (ATROVENT) 0.06 % nasal spray USE TWO SPRAYS IN EACH NOSTRIL TWICE DAILY AS DIRECTED. Patient taking differently: Place 1 spray into both nostrils daily as needed (allergies).  05/22/20  Yes Padgett, Pilar Grammes, MD  LINZESS 290 MCG CAPS capsule Take 290 mcg by mouth daily as needed (abdominal cramping).  07/03/20  Yes [provider]  mirtazapine (REMERON) 15 MG tablet Take 15 mg by mouth at bedtime. 02/08/20  Yes [provider]  Multiple  Vitamin (MULTIVITAMIN WITH MINERALS) TABS tablet Take 1 tablet by mouth daily.   Yes [provider]  rosuvastatin (CRESTOR) 5 MG tablet Take 5 mg by mouth once a week. 05/29/20  Yes [provider]  traZODone (DESYREL) 50 MG tablet Take 50 mg by mouth at bedtime as needed for sleep.  07/31/20  Yes [provider]  Zinc 50 MG TABS Take 50 mg by mouth daily.    Yes [provider]  oxyCODONE (OXY IR/ROXICODONE) 5 MG immediate release tablet Take 1 tablet (5 mg total) by mouth every 6 (six) hours as needed for up to 10 doses for severe pain. 05/20/20   Lanae Boast, MD  pantoprazole (PROTONIX) 40 MG tablet Take 1 tablet (40 mg total) by mouth 2 (two) times daily. Patient not taking: Reported on 10/08/2020 05/20/20 10/03/20  Lanae Boast, MD  promethazine (PHENERGAN) 25 MG tablet Take 1 tablet (25 mg total) by mouth every 6 (six) hours as needed for up to 7 days for nausea or vomiting. Patient not taking: Reported on 10/08/2020 05/20/20 10/03/20  Lanae Boast, MD  sucralfate (CARAFATE) 1 g tablet Take 1 tablet (1 g total) by mouth 2 (two) times daily for 14 days. Patient not taking: Reported on 10/08/2020 05/20/20 06/03/20  Lanae Boast, MD  azelastine (ASTELIN) 0.1 % nasal spray 2 sprays per nostril twice a day for control of nasal drainage. Patient not taking: Reported on 05/02/2020 11/27/19 05/02/20  Marcelyn Bruins, MD    Allergies    Contrast media [iodinated diagnostic agents], Other, Shellfish allergy, Reglan [metoclopramide], Iodine, Tetracyclines & related, and Zofran [ondansetron hcl]  Review of Systems   Review of Systems  Constitutional: Negative for fever.  Cardiovascular: Positive for chest pain.  Gastrointestinal: Positive for diarrhea and vomiting. Negative for abdominal pain.  Neurological: Positive for weakness.  All other systems reviewed and are negative.   Physical Exam Updated Vital Signs BP (!) 125/99   Pulse 68   Temp 98 F (36.7 C) (Oral)    Resp 14   SpO2 100%   Physical Exam Vitals and nursing note reviewed.  Constitutional:      Appearance: He is well-developed. He is not ill-appearing or diaphoretic.  HENT:     Head: Normocephalic and atraumatic.     Right Ear: External ear normal.     Left Ear: External ear normal.     Nose: Nose normal.  Eyes:     General:        Right eye: No discharge.        Left eye: No discharge.  Cardiovascular:     Rate and Rhythm: Normal rate and regular rhythm.     Heart sounds: Normal heart sounds.  Pulmonary:     Effort: Pulmonary effort is normal.  Breath sounds: Examination of the left-lower field reveals decreased breath sounds. Decreased breath sounds present.  Abdominal:     General: There is no distension.     Palpations: Abdomen is soft.     Tenderness: There is no abdominal tenderness.  Genitourinary:    Comments: No gross blood on DRE. Brown stool without melena. Musculoskeletal:     Cervical back: Neck supple.  Skin:    General: Skin is warm and dry.  Neurological:     Mental Status: He is alert.  Psychiatric:        Mood and Affect: Mood is not anxious.     ED Results / Procedures / Treatments   Labs (all labs ordered are listed, but only abnormal results are displayed) Labs Reviewed  COMPREHENSIVE METABOLIC PANEL - Abnormal; Notable for the following components:      Result Value   Glucose, Bld 104 (*)    Calcium 8.6 (*)    All other components within normal limits  CBC WITH DIFFERENTIAL/PLATELET - Abnormal; Notable for the following components:   RBC 3.76 (*)    Hemoglobin 12.0 (*)    HCT 35.2 (*)    All other components within normal limits  URINALYSIS, ROUTINE W REFLEX MICROSCOPIC - Abnormal; Notable for the following components:   Ketones, ur 5 (*)    All other components within normal limits  LIPASE, BLOOD  PROTIME-INR  POC OCCULT BLOOD, ED  TYPE AND SCREEN  ABO/RH    EKG EKG Interpretation  Date/Time:  Wednesday October 08 2020  20:39:41 EST Ventricular Rate:  73 PR Interval:    QRS Duration: 88 QT Interval:  396 QTC Calculation: 437 R Axis:   33 Text Interpretation: Sinus rhythm Abnormal R-wave progression, early transition Confirmed by Pricilla Loveless [phone]) on 10/08/2020 9:42:01 PM   Radiology DG Chest Portable 1 View  Result Date: 10/08/2020 CLINICAL DATA:  Chest pressure EXAM: PORTABLE CHEST 1 VIEW COMPARISON:  10/05/2020, CT chest 10/03/2020, 06/26/2020 FINDINGS: Right-sided central venous port tip over the distal SVC. History of partial left lower lobe lobectomy. Stable pleural and parenchymal opacity at the left base. A nodular opacity in the periphery of the left upper lung remains, there is decreased surrounding ground-glass opacity. Cardiomediastinal silhouette is stable. IMPRESSION: 1. Ovoid nodular masslike opacity in the left upper lung with interval decrease in surrounding ground-glass opacity. 2. Otherwise similar appearance of left pleural and parenchymal opacity at the left base with surgical changes. Electronically Signed   By: Jasmine Pang M.D.   On: 10/08/2020 19:49    Procedures Procedures (including critical care time)  Medications Ordered in ED Medications  heparin lock flush 100 unit/mL (has no administration in time range)  lactated ringers bolus 1,000 mL (0 mLs Intravenous Stopped 10/08/20 2336)  promethazine (PHENERGAN) injection 25 mg (25 mg Intravenous Given 10/08/20 2002)  lactated ringers bolus 1,000 mL (0 mLs Intravenous Stopped 10/08/20 2120)  fentaNYL (SUBLIMAZE) injection 100 mcg (100 mcg Intravenous Given 10/08/20 2126)  droperidol (INAPSINE) 2.5 MG/ML injection 2.5 mg (2.5 mg Intravenous Given 10/08/20 2213)  diphenhydrAMINE (BENADRYL) injection 25 mg (25 mg Intravenous Given 10/08/20 2222)  diphenhydrAMINE (BENADRYL) injection 25 mg (25 mg Intravenous Given 10/08/20 2249)    ED Course  I have reviewed the triage vital signs and the nursing notes.  Pertinent labs &  imaging results that were available during my care of the patient were reviewed by me and considered in my medical decision making (see chart for details).  MDM Rules/Calculators/A&P                          Patient's labs have been reviewed.  Mild ketones in the urine.  He was given IV fluids.  His electrolytes are benign.  His hemoglobin is improved since admission and there was no melena or gross blood on exam.  My suspicion is he has not having true hematemesis.  I do not think he needs admission at this time.  He is feeling better and tolerating p.o.  He states this is the exact same as when he was admitted a couple days ago where he had more work-up.  He appears stable for discharge home. Final Clinical Impression(s) / ED Diagnoses Final diagnoses:  Vomiting and diarrhea    Rx / DC Orders ED Discharge Orders    None       Pricilla Loveless, MD 10/08/20 2341

## 2020-10-13 ENCOUNTER — Encounter: Payer: Self-pay | Admitting: *Deleted

## 2020-10-13 ENCOUNTER — Other Ambulatory Visit: Payer: Self-pay | Admitting: *Deleted

## 2020-10-13 DIAGNOSIS — J90 Pleural effusion, not elsewhere classified: Secondary | ICD-10-CM | POA: Diagnosis not present

## 2020-10-13 DIAGNOSIS — Z09 Encounter for follow-up examination after completed treatment for conditions other than malignant neoplasm: Secondary | ICD-10-CM | POA: Diagnosis not present

## 2020-10-13 DIAGNOSIS — J9383 Other pneumothorax: Secondary | ICD-10-CM | POA: Diagnosis not present

## 2020-10-13 IMAGING — CT CT ABD-PELV W/O CM
2 of 4 series · 13 of 36 positions shown, 16 images · non-contrast
Comparison: 06/11/2020

CLINICAL DATA: Chest pain, bowel obstruction

EXAM:
CT CHEST, ABDOMEN AND PELVIS WITHOUT CONTRAST
TECHNIQUE: Multidetector CT imaging of the chest, abdomen and pelvis was
performed following the standard protocol without IV contrast.

[Series 2: cap w/o · axial · non-contrast · 0.67mm/px · z∈[+825,+1360]mm · 10 of 129 slices shown, 13 images]
[im 11/129  mediastinal]
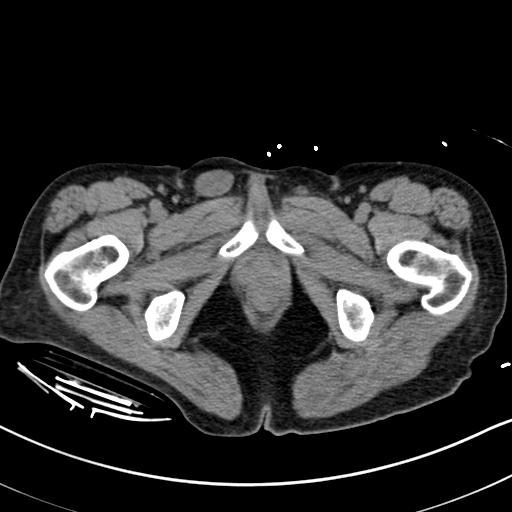
[im 11/129  lung]
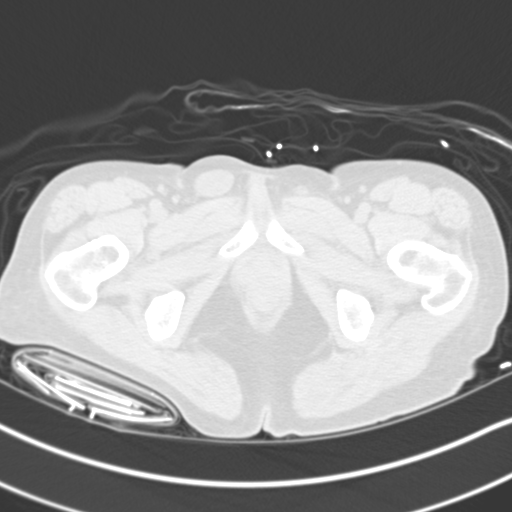
[im 22/129  lung]
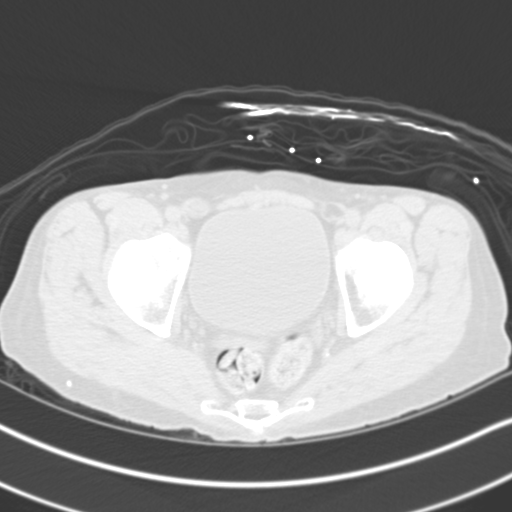
[im 33/129  lung]
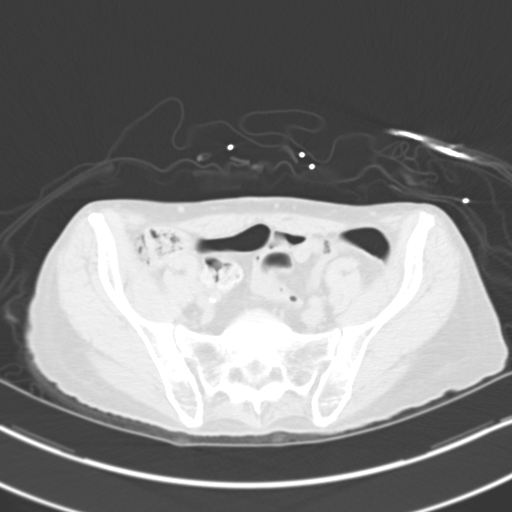
[im 43/129  lung]
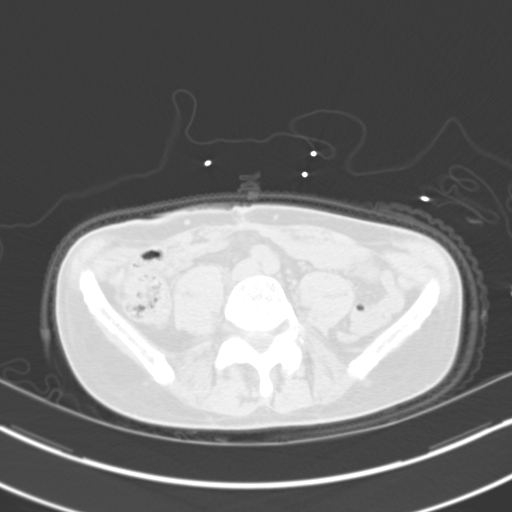
[im 54/129  mediastinal]
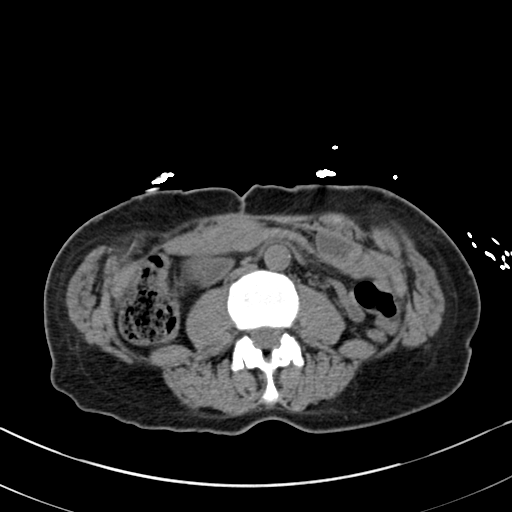
[im 54/129  lung]
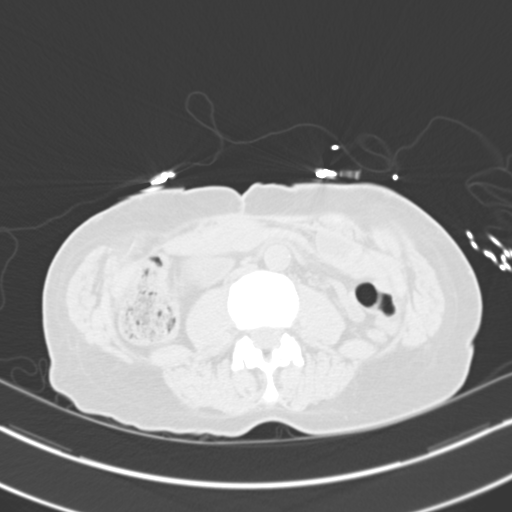
[im 75/129  lung]
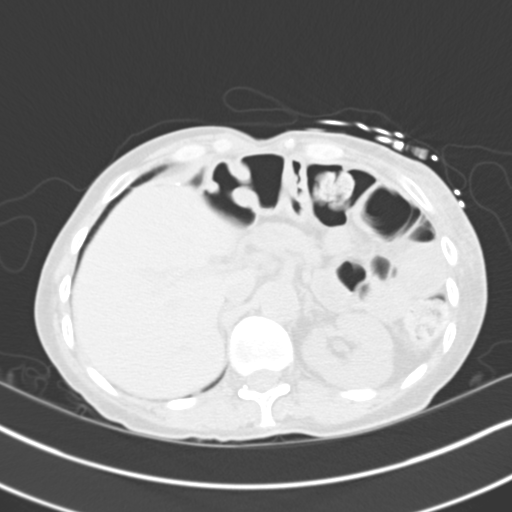
[im 86/129  lung]
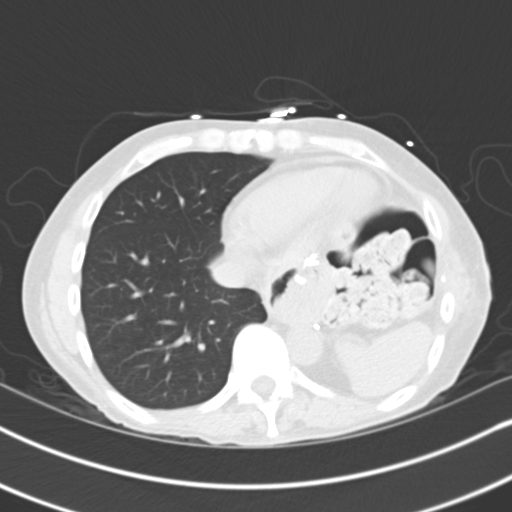
[im 97/129  lung]
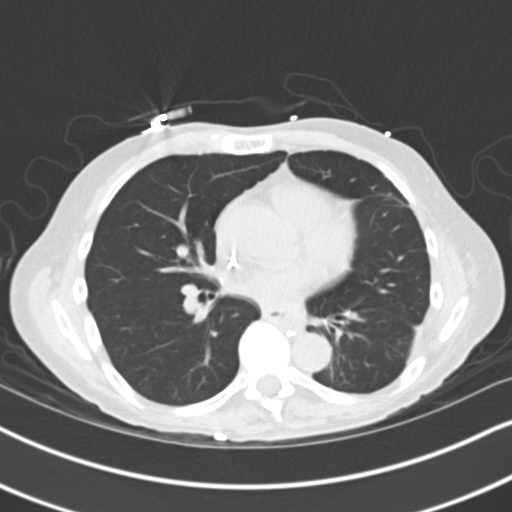
[im 107/129  mediastinal]
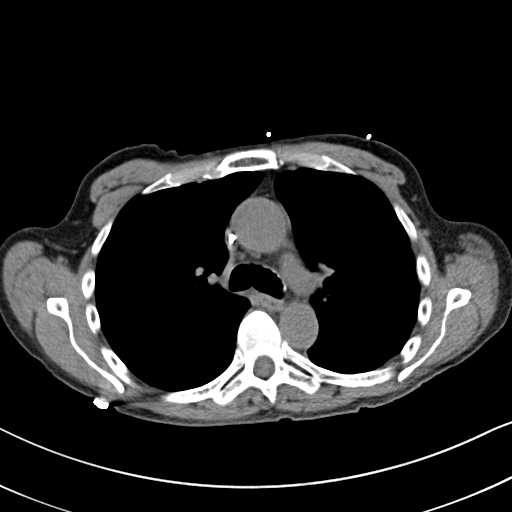
[im 107/129  lung]
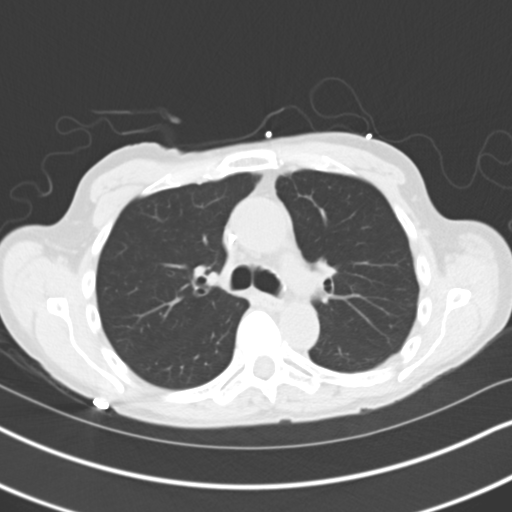
[im 118/129  lung]
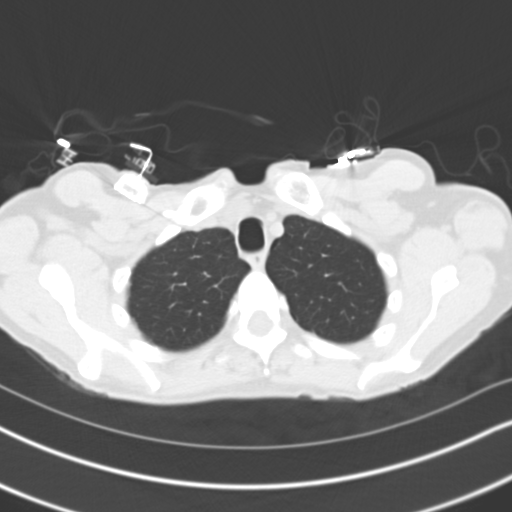

[Series 3: coronals · coronal · 0.73mm/px · 3 of 116 slices shown]
[im 24/116  lung]
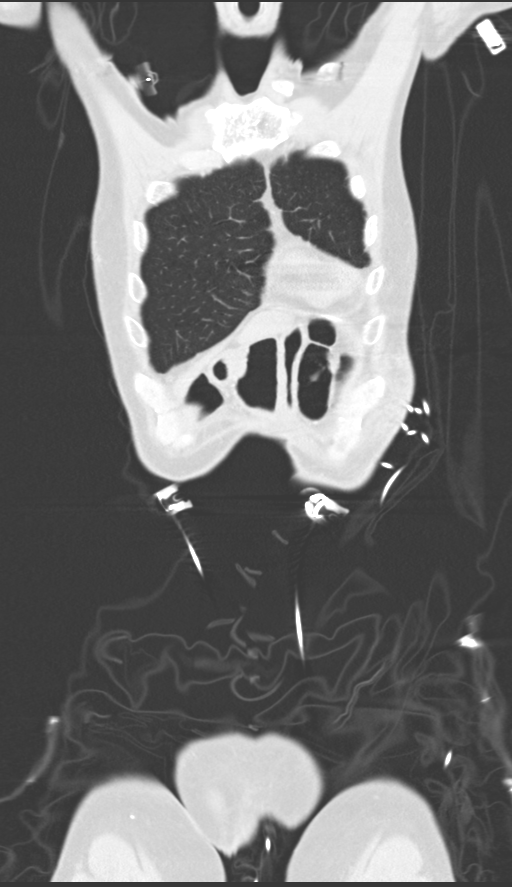
[im 47/116  lung]
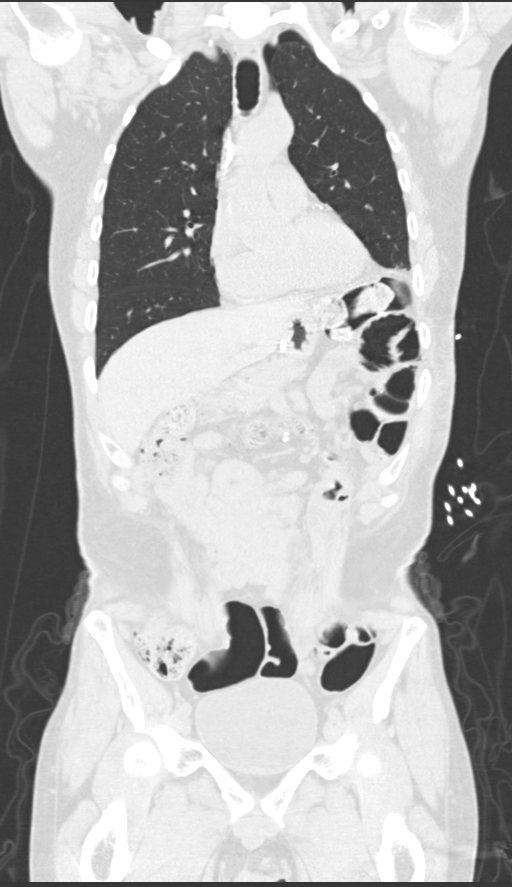
[im 70/116  lung]
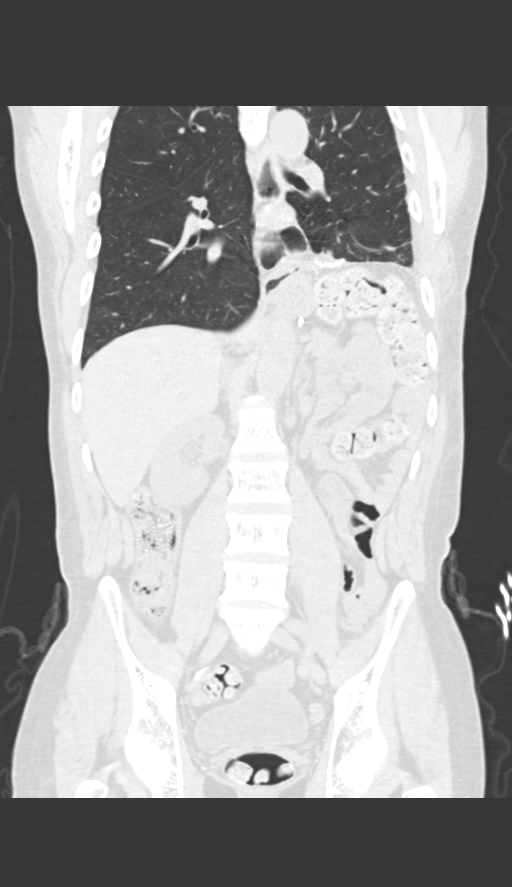

[13 of 36 positions shown; findings below may reference images not displayed]

FINDINGS: CT CHEST FINDINGS

Cardiovascular: Mild coronary artery calcification. Global cardiac
size within normal limits. No pericardial effusion. Central
pulmonary arteries are of normal caliber. Thoracic aorta is
unremarkable. Right subclavian chest port is in place with its tip
at the superior cavoatrial junction.

Mediastinum/Nodes: No pathologic thoracic adenopathy. Thyroid
unremarkable. The esophagus is mildly patulous and demonstrates an
air-fluid level suggesting either esophageal dysmotility or
gastroesophageal reflux. Surgical changes of probable fundoplication
are identified.

Lungs/Pleura: Tiny left apical pneumothorax persists, but has
decreased in size since prior examination. Partial left lower lobe
resection has been performed with associated left-sided volume loss.
Minimal left basilar scarring. Right lung is clear. No pneumothorax
on the right. No pleural effusion. Central airways are widely
patent.

Musculoskeletal: Left seventh rib thoracotomy defect is identified.
Subacute fractures of the left sixth and eighth ribs are noted
laterally. No lytic or blastic bone lesions are seen.

CT ABDOMEN PELVIS FINDINGS

Hepatobiliary: Cholecystectomy has been performed. Liver
unremarkable. No intra or extrahepatic biliary ductal dilation.

Pancreas: Unremarkable

Spleen: Unremarkable

Adrenals/Urinary Tract: Adrenal glands are unremarkable. Kidneys are
normal. Bladder unremarkable.

Stomach/Bowel: Surgical clips are seen in the region of the a
gastric antrum. Stomach otherwise unremarkable. Small and large
bowel are unremarkable. No evidence of obstruction or focal
inflammation. Appendix absent. No free intraperitoneal gas or fluid.

Vascular/Lymphatic: The abdominal vasculature is age-appropriate
with minimal atherosclerotic calcification noted. No pathologic
adenopathy within the abdomen and pelvis.

Reproductive: Prostate is unremarkable.

Other: Rectum unremarkable. There is elevation of the left
hemidiaphragm secondary to left-sided volume loss.

Musculoskeletal: No lytic or blastic bone lesions are seen.
IMPRESSION: 1. Tiny left apical pneumothorax, decreased in size since prior
examination.
2. Subacute fractures of the left sixth and eighth ribs laterally.
3. Postsurgical changes of left lower lobe resection.
4. Mildly patulous esophagus with an air-fluid level suggesting
either esophageal dysmotility or gastroesophageal reflux.
5. Normal examination of the small and large bowel.
6.  Aortic Atherosclerosis ([id number]).

## 2020-10-13 NOTE — Patient Outreach (Signed)
[facility]) Care Management  10/13/2020  Edwin Hall 07/30/1966 [id number]   RED ON EMMI ALERT Day # 1 Date: 11/24 Red Alert Reason: Other Questions or problems   Outreach attempt #1, successful.  Identity verified.  This care manager introduced self and stated purpose of call.  [facility] care management services explained.    Social: Patient report living alone, denies having any close friends/family he depends on for help.  State he is self sufficient, able to perform all ADL/IADL's independently.  Report he has been having issues with nausea/vomiting since his most recent surgery for pleurodesis, procedure was performed in [place].  He has been having issues with pain control, feels this may be the cause of his nausea.    Conditions: Per chart, has history of HTN, recurrent pneumothorax, dyslipidemia, and IBS.  However, denies having history of IBS.  Medications: Reviewed with member, state he is compliant.  Epic list state he is no longer taking Protonix and Promethazine, but member report he is still taking.  Appointments: Currently driving to [facility] to follow up with cardiothoracic surgeon.  Had last procedure for pleurodesis on 11/10.  Denies needing help with transportation.  This care manager inquired about above noted alert, he state he does not remember this call, denies providing this response.  Other than pain control (taking Gabapentin and Oxycodone for relief) denies any urgent concerns.  Hoping MD today can offer suggestions for better pain control.  Denies any nausea/vomiting today.  Encouraged to contact this care manager with questions.  Plan: RN CM will follow up with member within the next week.  Goals Addressed            This Visit's Progress   . [facility] - Make and Keep All Appointments       Follow Up Date 12/24   - call to cancel if needed - keep a calendar with appointment dates    Why is this important?    Part of staying healthy  is seeing the doctor for follow-up care.   If you forget your appointments, there are some things you can do to stay on track.    Notes:     . [facility] - [facility] My Quality of Life       Follow Up Date 12/04/2020   - discuss my treatment options with the doctor or nurse - make shared treatment decisions with doctor - name a health care proxy (decision maker)    Why is this important?    Having a long-term illness can be scary.   It can also be stressful for you and your caregiver.   These steps may help.    Notes:   11/29 - Discussed pain control      Kemper Durie, RN, MSN [facility] Care Management  [facility] Manager [phone]

## 2020-10-14 DIAGNOSIS — E876 Hypokalemia: Secondary | ICD-10-CM | POA: Diagnosis not present

## 2020-10-14 DIAGNOSIS — Z7689 Persons encountering health services in other specified circumstances: Secondary | ICD-10-CM | POA: Diagnosis not present

## 2020-10-14 DIAGNOSIS — G8918 Other acute postprocedural pain: Secondary | ICD-10-CM | POA: Diagnosis not present

## 2020-10-14 DIAGNOSIS — D649 Anemia, unspecified: Secondary | ICD-10-CM | POA: Diagnosis not present

## 2020-10-15 DIAGNOSIS — F321 Major depressive disorder, single episode, moderate: Secondary | ICD-10-CM | POA: Diagnosis not present

## 2020-10-15 DIAGNOSIS — I700 Atherosclerosis of aorta: Secondary | ICD-10-CM | POA: Diagnosis not present

## 2020-10-15 DIAGNOSIS — K2270 Barrett's esophagus without dysplasia: Secondary | ICD-10-CM | POA: Diagnosis not present

## 2020-10-17 ENCOUNTER — Other Ambulatory Visit: Payer: Self-pay

## 2020-10-17 ENCOUNTER — Encounter (HOSPITAL_COMMUNITY): Payer: Self-pay

## 2020-10-17 ENCOUNTER — Emergency Department (HOSPITAL_COMMUNITY)
Admission: EM | Admit: 2020-10-17 | Discharge: 2020-10-18 | Disposition: A | Discharge status: Home / Self Care | Payer: Medicare Other | Attending: Emergency Medicine | Admitting: Emergency Medicine

## 2020-10-17 ENCOUNTER — Emergency Department (HOSPITAL_COMMUNITY): Payer: Medicare Other

## 2020-10-17 DIAGNOSIS — R079 Chest pain, unspecified: Secondary | ICD-10-CM | POA: Diagnosis not present

## 2020-10-17 DIAGNOSIS — R112 Nausea with vomiting, unspecified: Secondary | ICD-10-CM | POA: Insufficient documentation

## 2020-10-17 DIAGNOSIS — Z79899 Other long term (current) drug therapy: Secondary | ICD-10-CM | POA: Diagnosis not present

## 2020-10-17 DIAGNOSIS — R1110 Vomiting, unspecified: Secondary | ICD-10-CM | POA: Diagnosis not present

## 2020-10-17 DIAGNOSIS — R0789 Other chest pain: Secondary | ICD-10-CM

## 2020-10-17 DIAGNOSIS — R109 Unspecified abdominal pain: Secondary | ICD-10-CM | POA: Insufficient documentation

## 2020-10-17 DIAGNOSIS — I10 Essential (primary) hypertension: Secondary | ICD-10-CM | POA: Insufficient documentation

## 2020-10-17 DIAGNOSIS — R0602 Shortness of breath: Secondary | ICD-10-CM | POA: Diagnosis not present

## 2020-10-17 DIAGNOSIS — K449 Diaphragmatic hernia without obstruction or gangrene: Secondary | ICD-10-CM | POA: Diagnosis not present

## 2020-10-17 DIAGNOSIS — R000 Tachycardia, unspecified: Secondary | ICD-10-CM | POA: Insufficient documentation

## 2020-10-17 DIAGNOSIS — R042 Hemoptysis: Secondary | ICD-10-CM | POA: Diagnosis not present

## 2020-10-17 DIAGNOSIS — R059 Cough, unspecified: Secondary | ICD-10-CM | POA: Diagnosis not present

## 2020-10-17 DIAGNOSIS — Z7982 Long term (current) use of aspirin: Secondary | ICD-10-CM | POA: Diagnosis not present

## 2020-10-17 LAB — COMPREHENSIVE METABOLIC PANEL
ALT: 18 U/L (ref 0–44)
AST: 21 U/L (ref 15–41)
Albumin: 3.7 g/dL (ref 3.5–5.0)
Alkaline Phosphatase: 66 U/L (ref 38–126)
Anion gap: 11 (ref 5–15)
BUN: 15 mg/dL (ref 6–20)
CO2: 24 mmol/L (ref 22–32)
Calcium: 8.9 mg/dL (ref 8.9–10.3)
Chloride: 101 mmol/L (ref 98–111)
Creatinine, Ser: 0.75 mg/dL (ref 0.61–1.24)
GFR, Estimated: >60 mL/min (ref >60)
Glucose, Bld: 99 mg/dL (ref 70–99)
Potassium: 3.8 mmol/L (ref 3.5–5.1)
Sodium: 136 mmol/L (ref 135–145)
Total Bilirubin: 0.6 mg/dL (ref 0.3–1.2)
Total Protein: 6.7 g/dL (ref 6.5–8.1)

## 2020-10-17 LAB — CBC
HCT: 38.9 % — ABNORMAL LOW (ref 39.0–52.0)
Hemoglobin: 13 g/dL (ref 13.0–17.0)
MCH: 31.4 pg (ref 26.0–34.0)
MCHC: 33.4 g/dL (ref 30.0–36.0)
MCV: 94 fL (ref 80.0–100.0)
Platelets: 254 10*3/uL (ref 150–400)
RBC: 4.14 MIL/uL — ABNORMAL LOW (ref 4.22–5.81)
RDW: 12.1 % (ref 11.5–15.5)
WBC: 3.7 10*3/uL — ABNORMAL LOW (ref 4.0–10.5)
nRBC: 0 % (ref 0.0–0.2)

## 2020-10-17 LAB — MAGNESIUM: Magnesium: 1.7 mg/dL (ref 1.7–2.4)

## 2020-10-17 LAB — LIPASE, BLOOD: Lipase: 27 U/L (ref 11–51)

## 2020-10-17 LAB — TROPONIN I (HIGH SENSITIVITY): Troponin I (High Sensitivity): 5 ng/L (ref <18)

## 2020-10-17 MED ORDER — PROMETHAZINE HCL 25 MG/ML IJ SOLN
12.5000 mg | Freq: Once | INTRAMUSCULAR | Status: AC
  Administered 2020-10-17: 12.5 mg via INTRAVENOUS
  Filled 2020-10-17: qty 1

## 2020-10-17 MED ORDER — HYDROMORPHONE HCL 1 MG/ML IJ SOLN
1.0000 mg | Freq: Once | INTRAMUSCULAR | Status: AC
  Administered 2020-10-17: 1 mg via INTRAVENOUS
  Filled 2020-10-17: qty 1

## 2020-10-17 MED ORDER — MORPHINE SULFATE (PF) 4 MG/ML IV SOLN
4.0000 mg | Freq: Once | INTRAVENOUS | Status: AC
  Administered 2020-10-17: 4 mg via INTRAVENOUS
  Filled 2020-10-17: qty 1

## 2020-10-17 MED ORDER — SUCRALFATE 1 G PO TABS: 1.0000 g | ORAL_TABLET | Freq: Three times a day (TID) | ORAL | 0 refills | Status: AC

## 2020-10-17 MED ORDER — LACTATED RINGERS IV SOLN: INTRAVENOUS | Status: DC

## 2020-10-17 MED ORDER — SUCRALFATE 1 G PO TABS
1.0000 g | ORAL_TABLET | Freq: Once | ORAL | Status: AC
  Administered 2020-10-17: 1 g via ORAL
  Filled 2020-10-17: qty 1

## 2020-10-17 MED ORDER — DIPHENHYDRAMINE HCL 50 MG/ML IJ SOLN
25.0000 mg | Freq: Once | INTRAMUSCULAR | Status: AC
  Administered 2020-10-17: 25 mg via INTRAVENOUS
  Filled 2020-10-17: qty 1

## 2020-10-17 MED ORDER — LACTATED RINGERS IV BOLUS
1000.0000 mL | Freq: Once | INTRAVENOUS | Status: AC
  Administered 2020-10-17: 1000 mL via INTRAVENOUS

## 2020-10-17 MED ORDER — HEPARIN SOD (PORK) LOCK FLUSH 100 UNIT/ML IV SOLN
500.0000 [IU] | Freq: Once | INTRAVENOUS | Status: AC
  Administered 2020-10-18: 500 [IU]
  Filled 2020-10-17: qty 5

## 2020-10-17 NOTE — ED Notes (Signed)
Per rees MD, no repeat troponin needed

## 2020-10-17 NOTE — ED Notes (Signed)
Pt refused PO challenge, stated he felt too nauseous. Informed provider.

## 2020-10-17 NOTE — ED Triage Notes (Signed)
Pt reports recently having a pleurodesis procedure. Pt states he has been having chest pain and SHOB since then, but the pain has recently become worse. Pt also endorses a new onset of vomiting and coughing up blood.

## 2020-10-17 NOTE — ED Provider Notes (Signed)
[facility]-EMERGENCY DEPT Provider Note   CSN: [id number] Arrival date & time: 10/17/20  1538     History Chief Complaint  Patient presents with  . Chest Pain  . Emesis    Edwin Hall is a 54 y.o. male.  The history is provided by the patient and medical records.  Chest Pain Associated symptoms: vomiting   Emesis  Edwin Hall is a 54 y.o. male who presents to the Emergency Department complaining of chest pain and vomiting. He presents the emergency department complaining of severe central and left sided chest pain that radiates to his back. He had a pleura thesis performed in [place] on November 10 due to history of recurrent pneumothorax following abdominal surgery. He states he has experienced pain on the left side of his chest since the surgery but this acutely worsened one week ago. It is described as sharp and constant in nature. Over the last week he is also experienced numerous episodes of emesis and vomits every time he attempts to eat or drink. Several days ago he had hemoptysis, this is now resolved. He does have some mild associated shortness of breath. His emesis appears like whenever he attempts to eat or drink. No fevers, abdominal pain, leg swelling or pain. He has been taking Phenergan at home for nausea as needed as well as oxycodone for pain with no significant improvement in his symptoms. He has lost 10 pounds in the last week.    Past Medical History:  Diagnosis Date  . Complication of anesthesia    nasuea and vomiting  . Food allergy   . Headache   . Hiatal hernia   . Hyperlipidemia   . IBS (irritable bowel syndrome)   . Iron deficiency anemia   . Urticaria     Patient Active Problem List   Diagnosis Date Noted  . FTT (failure to thrive) in adult 10/04/2020  . Intractable nausea and vomiting 10/04/2020  . Intractable vomiting 06/26/2020  . Severe protein-calorie malnutrition Lily Kocher: less than 60% of standard weight) (HCC)  06/26/2020  . IBS (irritable bowel syndrome) 06/26/2020  . Dyslipidemia 06/26/2020  . HTN (hypertension) 06/26/2020  . Iron deficiency anemia due to dietary causes 06/26/2020  . Pneumothorax 05/15/2020  . Nausea & vomiting 05/15/2020  . Unintentional weight loss 05/15/2020    Past Surgical History:  Procedure Laterality Date  . ABDOMINAL SURGERY  12/2015   pylorus plasty  . APPENDECTOMY  1984  . BIOPSY  05/18/2020   Procedure: BIOPSY;  Surgeon: Kerin Salen, MD;  Location: [facility];  Service: Gastroenterology;;  . Ephriam Knuckles  . ESOPHAGOGASTRODUODENOSCOPY (EGD) WITH PROPOFOL N/A 05/18/2020   Procedure: ESOPHAGOGASTRODUODENOSCOPY (EGD) WITH PROPOFOL;  Surgeon: Kerin Salen, MD;  Location: [facility];  Service: Gastroenterology;  Laterality: N/A;  . GASTROSTOMY TUBE PLACEMENT    . HIATAL HERNIA REPAIR  07/2015  . TALC PLEURODESIS    . TONSILLECTOMY  1980  . VENTRAL HERNIA REPAIR  2010       Family History  Problem Relation Age of Onset  . Parkinson's disease Father   . Heart disease Father   . Angioedema Mother   . Allergic rhinitis Neg Hx   . Asthma Neg Hx   . Eczema Neg Hx   . Immunodeficiency Neg Hx   . Urticaria Neg Hx     Social History   Tobacco Use  . Smoking status: Never Smoker  . Smokeless tobacco: Never Used  Vaping Use  . Vaping Use:  Never used  Substance Use Topics  . Alcohol use: No  . Drug use: No    Home Medications Prior to Admission medications   Medication Sig Start Date End Date Taking? Authorizing Provider  acetaminophen (TYLENOL) 500 MG tablet Take 1,000 mg by mouth every 6 (six) hours as needed for moderate pain.   Yes [provider]  aspirin 81 MG EC tablet Take 81 mg by mouth daily. Swallow whole.   Yes [provider]  Cholecalciferol (VITAMIN D3 PO) Take 250 mcg by mouth daily.    Yes [provider]  Cyanocobalamin (VITAMIN B 12 PO) Take 1 capsule by mouth daily.   Yes [provider]    cyclobenzaprine (FLEXERIL) 10 MG tablet Take 10 mg by mouth 3 (three) times daily as needed for muscle spasms.   Yes [provider]  Ferrous Sulfate (IRON) 325 (65 Fe) MG TABS Take 325 mg by mouth every other day.  05/12/20  Yes [provider]  gabapentin (NEURONTIN) 300 MG capsule Take 300 mg by mouth daily.  03/29/20  Yes [provider]  Ginkgo Biloba 40 MG TABS Take 40 mg by mouth daily.    Yes [provider]  ipratropium (ATROVENT) 0.06 % nasal spray USE TWO SPRAYS IN EACH NOSTRIL TWICE DAILY AS DIRECTED. Patient taking differently: Place 1 spray into both nostrils daily as needed for rhinitis.  05/22/20  Yes Padgett, Pilar Grammes, MD  LINZESS 290 MCG CAPS capsule Take 290 mcg by mouth daily as needed (abdominal cramping).  07/03/20  Yes [provider]  mirtazapine (REMERON) 15 MG tablet Take 15 mg by mouth at bedtime. 02/08/20  Yes [provider]  Multiple Vitamin (MULTIVITAMIN WITH MINERALS) TABS tablet Take 1 tablet by mouth daily.   Yes [provider]  oxyCODONE (OXY IR/ROXICODONE) 5 MG immediate release tablet Take 1 tablet (5 mg total) by mouth every 6 (six) hours as needed for up to 10 doses for severe pain. 05/20/20  Yes Lanae Boast, MD  pantoprazole (PROTONIX) 40 MG tablet Take 1 tablet (40 mg total) by mouth 2 (two) times daily. 05/20/20 10/17/20 Yes Lanae Boast, MD  promethazine (PHENERGAN) 25 MG tablet Take 1 tablet (25 mg total) by mouth every 6 (six) hours as needed for up to 7 days for nausea or vomiting. Patient taking differently: Take 25 mg by mouth every 6 (six) hours as needed for nausea or vomiting. Nausea 05/20/20 10/17/20 Yes Lanae Boast, MD  rosuvastatin (CRESTOR) 5 MG tablet Take 5 mg by mouth once a week. Sundays 05/29/20  Yes [provider]  tamsulosin (FLOMAX) 0.4 MG CAPS capsule Take 0.4 mg by mouth daily.   Yes [provider]  traZODone (DESYREL) 50 MG tablet Take 50 mg by mouth at bedtime  as needed for sleep.  07/31/20  Yes [provider]  Zinc 50 MG TABS Take 50 mg by mouth daily.    Yes [provider]  amoxicillin-clavulanate (AUGMENTIN) 875-125 MG tablet Take 1 tablet by mouth 2 (two) times daily. Start date : 10/06/20.    [provider]  sucralfate (CARAFATE) 1 g tablet Take 1 tablet (1 g total) by mouth 4 (four) times daily -  with meals and at bedtime. 10/17/20   Tilden Fossa, MD  azelastine (ASTELIN) 0.1 % nasal spray 2 sprays per nostril twice a day for control of nasal drainage. Patient not taking: Reported on 05/02/2020 11/27/19 05/02/20  Marcelyn Bruins, MD    Allergies  Contrast media [iodinated diagnostic agents], Other, Shellfish allergy, Reglan [metoclopramide], Iodine, Tetracyclines & related, and Zofran [ondansetron hcl]  Review of Systems   Review of Systems  Cardiovascular: Positive for chest pain.  Gastrointestinal: Positive for vomiting.  All other systems reviewed and are negative.   Physical Exam Updated Vital Signs BP 114/86   Pulse 85   Temp 98.5 F (36.9 C) (Oral)   Resp 17   SpO2 98%   Physical Exam Vitals and nursing note reviewed.  Constitutional:      Appearance: He is well-developed.  HENT:     Head: Normocephalic and atraumatic.  Cardiovascular:     Rate and Rhythm: Regular rhythm. Tachycardia present.     Heart sounds: No murmur heard.   Pulmonary:     Effort: Pulmonary effort is normal. No respiratory distress.     Breath sounds: Normal breath sounds.  Chest:     Chest wall: No tenderness.  Abdominal:     Palpations: Abdomen is soft.     Tenderness: There is no abdominal tenderness. There is no guarding or rebound.  Musculoskeletal:        General: No swelling or tenderness.  Skin:    General: Skin is warm and dry.  Neurological:     Mental Status: He is alert and oriented to person, place, and time.  Psychiatric:        Behavior: Behavior normal.     ED Results /  Procedures / Treatments   Labs (all labs ordered are listed, but only abnormal results are displayed) Labs Reviewed  CBC - Abnormal; Notable for the following components:      Result Value   WBC 3.7 (*)    RBC 4.14 (*)    HCT 38.9 (*)    All other components within normal limits  COMPREHENSIVE METABOLIC PANEL  LIPASE, BLOOD  MAGNESIUM  TROPONIN I (HIGH SENSITIVITY)    EKG EKG Interpretation  Date/Time:  Friday October 17 2020 16:00:58 EST Ventricular Rate:  113 PR Interval:    QRS Duration: 92 QT Interval:  320 QTC Calculation: 439 R Axis:   45 Text Interpretation: Sinus tachycardia Right atrial enlargement 12 Lead; Mason-Likar Confirmed by Tilden Fossa [phone]) on 10/17/2020 4:23:22 PM   Radiology CT Abdomen Pelvis Wo Contrast  Result Date: 10/17/2020 CLINICAL DATA:  Chest pain and shortness of breath. New onset vomiting and coughing. EXAM: CT ABDOMEN AND PELVIS WITHOUT CONTRAST TECHNIQUE: Multidetector CT imaging of the abdomen and pelvis was performed following the standard protocol without IV contrast. COMPARISON:  October 03, 2020 FINDINGS: Lower chest: There are postsurgical changes at the left lung base as before.The heart size is normal. Hepatobiliary: The liver is normal. Status post cholecystectomy.There is no biliary ductal dilation. Pancreas: Normal contours without ductal dilatation. No peripancreatic fluid collection. Spleen: Unremarkable. Adrenals/Urinary Tract: --Adrenal glands: Unremarkable. --Right kidney/ureter: No hydronephrosis or radiopaque kidney stones. --Left kidney/ureter: No hydronephrosis or radiopaque kidney stones. --Urinary bladder: The urinary bladder is moderately distended. Stomach/Bowel: --Stomach/Duodenum: There is a large hiatal hernia. --Small bowel: Unremarkable. --Colon: Unremarkable. --Appendix: Normal. Vascular/Lymphatic: Atherosclerotic calcification is present within the non-aneurysmal abdominal aorta, without hemodynamically significant  stenosis. --No retroperitoneal lymphadenopathy. --No mesenteric lymphadenopathy. --No pelvic or inguinal lymphadenopathy. Reproductive: Unremarkable Other: No ascites or free air. The abdominal wall is normal. Musculoskeletal. No acute displaced fractures. IMPRESSION: 1. No acute abdominopelvic abnormality. 2. Large hiatal hernia. Aortic Atherosclerosis (ICD10-I70.0). Electronically Signed   By: Katherine Mantle M.D.   On: 10/17/2020 21:57   DG  Chest 2 View  Result Date: 10/17/2020 CLINICAL DATA:  Recent pleurodesis, chest pain and shortness of breath, hemoptysis EXAM: CHEST - 2 VIEW COMPARISON:  10/08/2020, 10/03/2020, 09/29/2020 FINDINGS: Frontal and lateral views of the chest demonstrates stable right chest wall port. The cardiac silhouette is unremarkable. Stable volume loss left hemithorax with elevation of the left hemidiaphragm. Left pleural thickening unchanged. No evidence of pneumothorax. The rounded area of fluid within the left upper lobe seen previously is not significantly changed. The surrounding ground-glass attenuation within the left upper lobe has near completely resolved in the interim. Right chest is clear. IMPRESSION: 1. Stable postsurgical changes left hemithorax, with volume loss and pleural thickening. 2. Stable left upper lobe intraparenchymal fluid collection, with resolution of the surrounding ground-glass airspace disease seen previously. Electronically Signed   By: Sharlet SalinaMichael  Brown M.D.   On: 10/17/2020 16:39    Procedures Procedures (including critical care time)  Medications Ordered in ED Medications  lactated ringers infusion ( Intravenous New Bag/Given (Non-Interop) 10/17/20 1945)  morphine 4 MG/ML injection 4 mg (4 mg Intravenous Given 10/17/20 1710)  promethazine (PHENERGAN) injection 12.5 mg (12.5 mg Intravenous Given 10/17/20 1719)  lactated ringers bolus 1,000 mL (0 mLs Intravenous Stopped 10/17/20 1902)  morphine 4 MG/ML injection 4 mg (4 mg Intravenous Given  10/17/20 1857)  promethazine (PHENERGAN) injection 12.5 mg (12.5 mg Intravenous Given 10/17/20 1942)  diphenhydrAMINE (BENADRYL) injection 25 mg (25 mg Intravenous Given 10/17/20 2107)  HYDROmorphone (DILAUDID) injection 1 mg (1 mg Intravenous Given 10/17/20 2149)  HYDROmorphone (DILAUDID) injection 1 mg (1 mg Intravenous Given 10/17/20 2310)  sucralfate (CARAFATE) tablet 1 g (1 g Oral Given 10/17/20 2312)    ED Course  I have reviewed the triage vital signs and the nursing notes.  Pertinent labs & imaging results that were available during my care of the patient were reviewed by me and considered in my medical decision making (see chart for details).    MDM Rules/Calculators/A&P                         patient here for evaluation of progressive chest pain following pleura thesis performed on November 10 with one week of nausea and vomiting. He has experienced weight loss. He did have transient hemoptysis several days ago but this is now resolved. Patient tachycardic on ED arrival. Heart rate improved after fluids and medication administration. Labs are essentially within normal limits with normal electrolytes. Presentation is not consistent with ACS, PE, dissection. His pain and nausea are improved on reassessment. CT scan without any evidence of obstruction or incarceration. Discussed with patient findings of studies. Discussed home symptom management, liquid diet, outpatient surgery G.I. follow-up.    He states that he has antiemetics available at home.  Final Clinical Impression(s) / ED Diagnoses Final diagnoses:  Atypical chest pain  Non-intractable vomiting with nausea, unspecified vomiting type    Rx / DC Orders ED Discharge Orders         Ordered    sucralfate (CARAFATE) 1 g tablet  3 times daily with meals & bedtime        10/17/20 2329           Tilden Fossaees, Cherisa Brucker, MD 10/17/20 2349

## 2020-10-18 DIAGNOSIS — R0789 Other chest pain: Secondary | ICD-10-CM | POA: Diagnosis not present

## 2020-10-20 ENCOUNTER — Other Ambulatory Visit: Payer: Self-pay | Admitting: *Deleted

## 2020-10-20 NOTE — Patient Outreach (Signed)
[facility]) Care Management  10/20/2020  Edwin Hall 05-30-1966 [id number]   Call placed to member to follow up on appointment with cardiothoracic surgeon.  He report he is still having pain issues, taking Tylenol with minimal relief.  No nausea/vomiting today.  He will follow up with the office within the next couple weeks, state they are considering a nerve block to decrease pain.  Denies needs from this care manager at this time, agrees to follow up within the next month.  Meanwhile he will continue to take Tylenol for pain relief, will contact MD if pain remain uncontrolled.  Advised to contact this care manager with questions.  Goals Addressed            This Visit's Progress   . [facility] - Make and Keep All Appointments   On track    Follow Up Date 12/24   - call to cancel if needed - keep a calendar with appointment dates    Why is this important?    Part of staying healthy is seeing the doctor for follow-up care.   If you forget your appointments, there are some things you can do to stay on track.    Notes:     . [facility] - [facility] My Quality of Life   On track    Follow Up Date 12/04/2020   - discuss my treatment options with the doctor or nurse - make shared treatment decisions with doctor - name a health care proxy (decision maker)    Why is this important?    Having a long-term illness can be scary.   It can also be stressful for you and your caregiver.   These steps may help.    Notes:   11/29 - Discussed pain control      Kemper Durie, RN, MSN [facility] Care Management  [facility] Manager [phone]

## 2020-10-27 DIAGNOSIS — K2270 Barrett's esophagus without dysplasia: Secondary | ICD-10-CM | POA: Diagnosis not present

## 2020-10-27 DIAGNOSIS — Z7982 Long term (current) use of aspirin: Secondary | ICD-10-CM | POA: Diagnosis not present

## 2020-10-27 DIAGNOSIS — Z9889 Other specified postprocedural states: Secondary | ICD-10-CM | POA: Diagnosis not present

## 2020-10-27 DIAGNOSIS — Q399 Congenital malformation of esophagus, unspecified: Secondary | ICD-10-CM | POA: Diagnosis not present

## 2020-10-27 DIAGNOSIS — Z79899 Other long term (current) drug therapy: Secondary | ICD-10-CM | POA: Diagnosis not present

## 2020-10-27 DIAGNOSIS — Z791 Long term (current) use of non-steroidal anti-inflammatories (NSAID): Secondary | ICD-10-CM | POA: Diagnosis not present

## 2020-10-27 DIAGNOSIS — R1310 Dysphagia, unspecified: Secondary | ICD-10-CM | POA: Diagnosis not present

## 2020-10-27 DIAGNOSIS — K219 Gastro-esophageal reflux disease without esophagitis: Secondary | ICD-10-CM | POA: Diagnosis not present

## 2020-11-04 DIAGNOSIS — K219 Gastro-esophageal reflux disease without esophagitis: Secondary | ICD-10-CM | POA: Diagnosis not present

## 2020-11-06 DIAGNOSIS — K2270 Barrett's esophagus without dysplasia: Secondary | ICD-10-CM | POA: Diagnosis not present

## 2020-11-06 DIAGNOSIS — Z9889 Other specified postprocedural states: Secondary | ICD-10-CM | POA: Diagnosis not present

## 2020-11-06 DIAGNOSIS — R1310 Dysphagia, unspecified: Secondary | ICD-10-CM | POA: Diagnosis not present

## 2020-11-17 ENCOUNTER — Other Ambulatory Visit: Payer: Self-pay | Admitting: *Deleted

## 2020-11-17 NOTE — Patient Outreach (Signed)
[facility]) Care Management  11/17/2020  Edwin Hall 17-May-1966 [id number]   Outgoing call placed to member, state this is not a good time to talk and request to call this care manager back later.  Will await call back, if no call back will follow up within the next 3-4 business days.  Kemper Durie, [place], MSN [facility] Care Management  [facility] Manager [phone]

## 2020-11-20 DIAGNOSIS — Z681 Body mass index (BMI) 19 or less, adult: Secondary | ICD-10-CM | POA: Diagnosis not present

## 2020-11-20 DIAGNOSIS — R1012 Left upper quadrant pain: Secondary | ICD-10-CM | POA: Diagnosis not present

## 2020-11-20 DIAGNOSIS — M62838 Other muscle spasm: Secondary | ICD-10-CM | POA: Diagnosis not present

## 2020-11-21 ENCOUNTER — Other Ambulatory Visit: Payer: Self-pay | Admitting: *Deleted

## 2020-11-21 DIAGNOSIS — Z789 Other specified health status: Secondary | ICD-10-CM | POA: Diagnosis not present

## 2020-11-21 DIAGNOSIS — Z452 Encounter for adjustment and management of vascular access device: Secondary | ICD-10-CM | POA: Diagnosis not present

## 2020-11-21 NOTE — Patient Outreach (Signed)
[facility]) Care Management  11/21/2020  NEWMAN WAREN [birth year] [id number]   Outreach attempt #2, member state he is unable to talk and will call this care manager back.  Will await call back, if no call back will send outreach letter and follow up within the next 3-4 business days.  Kemper Durie, [place], MSN [facility] Care Management  [facility] Manager [phone]

## 2020-11-26 ENCOUNTER — Other Ambulatory Visit: Payer: Self-pay | Admitting: *Deleted

## 2020-11-26 NOTE — Patient Outreach (Signed)
[facility]) Care Management  11/26/2020  Edwin Hall May 11, 1966 [id number]   Outreach attempt #3, successful.  He report he is still having issues abdominal pain, no nausea/vomiting.  State he may have another hernia.  He will have a CT scan today and will follow up with surgeon in [place].  Discussed ongoing follow up and involvement with [facility] as this care manager has had a difficult time reaching member and completing assessments.  State he is unsure, unaware of what his needs will be until after follow up.  Agrees to have this care manager reach out within the next month.  Goals Addressed            This Visit's Progress   . [facility] - Make and Keep All Appointments   On track    Follow Up Date 12/24   - call to cancel if needed - keep a calendar with appointment dates    Why is this important?    Part of staying healthy is seeing the doctor for follow-up care.   If you forget your appointments, there are some things you can do to stay on track.    Notes:     . [facility] - [facility] My Quality of Life   On track    Follow Up Date 12/04/2020   - discuss my treatment options with the doctor or nurse - make shared treatment decisions with doctor - name a health care proxy (decision maker)    Why is this important?    Having a long-term illness can be scary.   It can also be stressful for you and your caregiver.   These steps may help.    Notes:   11/29 - Discussed pain control      Kemper Durie, RN, MSN [facility] Care Management  [facility] Manager [phone]

## 2020-12-04 DIAGNOSIS — R1012 Left upper quadrant pain: Secondary | ICD-10-CM | POA: Diagnosis not present

## 2020-12-08 DIAGNOSIS — R1310 Dysphagia, unspecified: Secondary | ICD-10-CM | POA: Diagnosis not present

## 2020-12-08 DIAGNOSIS — K219 Gastro-esophageal reflux disease without esophagitis: Secondary | ICD-10-CM | POA: Diagnosis not present

## 2020-12-08 DIAGNOSIS — Z9889 Other specified postprocedural states: Secondary | ICD-10-CM | POA: Diagnosis not present

## 2020-12-08 DIAGNOSIS — K2270 Barrett's esophagus without dysplasia: Secondary | ICD-10-CM | POA: Diagnosis not present

## 2020-12-08 DIAGNOSIS — K222 Esophageal obstruction: Secondary | ICD-10-CM | POA: Diagnosis not present

## 2020-12-15 DIAGNOSIS — J309 Allergic rhinitis, unspecified: Secondary | ICD-10-CM | POA: Diagnosis not present

## 2020-12-16 DIAGNOSIS — E876 Hypokalemia: Secondary | ICD-10-CM | POA: Diagnosis not present

## 2020-12-16 DIAGNOSIS — J309 Allergic rhinitis, unspecified: Secondary | ICD-10-CM | POA: Diagnosis not present

## 2020-12-16 DIAGNOSIS — K2270 Barrett's esophagus without dysplasia: Secondary | ICD-10-CM | POA: Diagnosis not present

## 2020-12-16 DIAGNOSIS — F321 Major depressive disorder, single episode, moderate: Secondary | ICD-10-CM | POA: Diagnosis not present

## 2020-12-16 DIAGNOSIS — I700 Atherosclerosis of aorta: Secondary | ICD-10-CM | POA: Diagnosis not present

## 2020-12-19 DIAGNOSIS — J310 Chronic rhinitis: Secondary | ICD-10-CM | POA: Diagnosis not present

## 2020-12-19 DIAGNOSIS — Z6820 Body mass index (BMI) 20.0-20.9, adult: Secondary | ICD-10-CM | POA: Diagnosis not present

## 2020-12-19 DIAGNOSIS — M7989 Other specified soft tissue disorders: Secondary | ICD-10-CM | POA: Diagnosis not present

## 2020-12-19 DIAGNOSIS — Z111 Encounter for screening for respiratory tuberculosis: Secondary | ICD-10-CM | POA: Diagnosis not present

## 2020-12-24 ENCOUNTER — Other Ambulatory Visit: Payer: Self-pay | Admitting: *Deleted

## 2020-12-24 NOTE — Patient Outreach (Signed)
[facility]) Care Management  12/24/2020  Edwin Hall Jun 04, 1966 [id number]   Outgoing call to member, state he is on his way out the door, does not have time to talk and will call back later if he has time.  Will await call back, if no call back will follow up within the next 3-4 business days.  Kemper Durie, [place], MSN [facility] Care Management  [facility] Manager [phone]

## 2020-12-29 ENCOUNTER — Other Ambulatory Visit: Payer: Self-pay | Admitting: *Deleted

## 2020-12-29 NOTE — Patient Outreach (Signed)
[facility] ([facility]) Care Management  12/29/2020  Edwin Hall 06/09/1966 [id number]   Outreach attempt #2, successful.  Member report he is doing well, nausea/vomiting are no longer an issue, pain controlled.  He does not have a hernia as he expected, will have follow up with GI in [place] tomorrow.  At this time, he denies any ongoing concerns, will close case as goals have been met.  Will notify PCP of case closure.  Goals Addressed            This Visit's Progress   . COMPLETED: [facility] - Make and Keep All Appointments       Follow Up Date 12/24   - call to cancel if needed - keep a calendar with appointment dates    Why is this important?    Part of staying healthy is seeing the doctor for follow-up care.   If you forget your appointments, there are some things you can do to stay on track.    Notes:     . COMPLETED: [facility] - [facility] My Quality of Life       Follow Up Date 12/04/2020   - discuss my treatment options with the doctor or nurse - make shared treatment decisions with doctor - name a health care proxy (decision maker)    Why is this important?    Having a long-term illness can be scary.   It can also be stressful for you and your caregiver.   These steps may help.    Notes:   11/29 - Discussed pain control         [place], RN, MSN [facility] Care Management  [facility] Care Manager [phone]  

## 2020-12-30 DIAGNOSIS — R21 Rash and other nonspecific skin eruption: Secondary | ICD-10-CM | POA: Diagnosis not present

## 2020-12-30 DIAGNOSIS — K224 Dyskinesia of esophagus: Secondary | ICD-10-CM | POA: Diagnosis not present

## 2020-12-30 DIAGNOSIS — R1310 Dysphagia, unspecified: Secondary | ICD-10-CM | POA: Diagnosis not present

## 2020-12-30 DIAGNOSIS — I781 Nevus, non-neoplastic: Secondary | ICD-10-CM | POA: Diagnosis not present

## 2020-12-30 DIAGNOSIS — K589 Irritable bowel syndrome without diarrhea: Secondary | ICD-10-CM | POA: Diagnosis not present

## 2020-12-30 DIAGNOSIS — R110 Nausea: Secondary | ICD-10-CM | POA: Diagnosis not present

## 2021-01-01 DIAGNOSIS — Z119 Encounter for screening for infectious and parasitic diseases, unspecified: Secondary | ICD-10-CM | POA: Diagnosis not present

## 2021-01-01 DIAGNOSIS — K5909 Other constipation: Secondary | ICD-10-CM | POA: Diagnosis not present

## 2021-01-01 DIAGNOSIS — Z6820 Body mass index (BMI) 20.0-20.9, adult: Secondary | ICD-10-CM | POA: Diagnosis not present

## 2021-01-01 DIAGNOSIS — Z111 Encounter for screening for respiratory tuberculosis: Secondary | ICD-10-CM | POA: Diagnosis not present

## 2021-01-01 DIAGNOSIS — Z789 Other specified health status: Secondary | ICD-10-CM | POA: Diagnosis not present

## 2021-01-07 DIAGNOSIS — Z119 Encounter for screening for infectious and parasitic diseases, unspecified: Secondary | ICD-10-CM | POA: Diagnosis not present

## 2021-01-13 DIAGNOSIS — K2270 Barrett's esophagus without dysplasia: Secondary | ICD-10-CM | POA: Diagnosis not present

## 2021-01-13 DIAGNOSIS — Z119 Encounter for screening for infectious and parasitic diseases, unspecified: Secondary | ICD-10-CM | POA: Diagnosis not present

## 2021-01-13 DIAGNOSIS — I700 Atherosclerosis of aorta: Secondary | ICD-10-CM | POA: Diagnosis not present

## 2021-01-13 DIAGNOSIS — E876 Hypokalemia: Secondary | ICD-10-CM | POA: Diagnosis not present

## 2021-01-13 DIAGNOSIS — F321 Major depressive disorder, single episode, moderate: Secondary | ICD-10-CM | POA: Diagnosis not present

## 2021-01-14 DIAGNOSIS — R079 Chest pain, unspecified: Secondary | ICD-10-CM | POA: Diagnosis not present

## 2021-01-14 DIAGNOSIS — R109 Unspecified abdominal pain: Secondary | ICD-10-CM | POA: Diagnosis not present

## 2021-01-14 DIAGNOSIS — K4090 Unilateral inguinal hernia, without obstruction or gangrene, not specified as recurrent: Secondary | ICD-10-CM | POA: Diagnosis not present

## 2021-01-14 DIAGNOSIS — K449 Diaphragmatic hernia without obstruction or gangrene: Secondary | ICD-10-CM | POA: Diagnosis not present

## 2021-01-14 DIAGNOSIS — R112 Nausea with vomiting, unspecified: Secondary | ICD-10-CM | POA: Diagnosis not present

## 2021-01-14 DIAGNOSIS — N400 Enlarged prostate without lower urinary tract symptoms: Secondary | ICD-10-CM | POA: Diagnosis not present

## 2021-01-14 DIAGNOSIS — R1084 Generalized abdominal pain: Secondary | ICD-10-CM | POA: Diagnosis not present

## 2021-01-14 DIAGNOSIS — R197 Diarrhea, unspecified: Secondary | ICD-10-CM | POA: Diagnosis not present

## 2021-01-20 DIAGNOSIS — I700 Atherosclerosis of aorta: Secondary | ICD-10-CM | POA: Diagnosis not present

## 2021-01-20 DIAGNOSIS — K56609 Unspecified intestinal obstruction, unspecified as to partial versus complete obstruction: Secondary | ICD-10-CM | POA: Diagnosis not present

## 2021-01-20 DIAGNOSIS — R109 Unspecified abdominal pain: Secondary | ICD-10-CM | POA: Diagnosis not present

## 2021-01-20 DIAGNOSIS — Z8719 Personal history of other diseases of the digestive system: Secondary | ICD-10-CM | POA: Diagnosis not present

## 2021-01-20 DIAGNOSIS — R1032 Left lower quadrant pain: Secondary | ICD-10-CM | POA: Diagnosis not present

## 2021-01-20 DIAGNOSIS — N3289 Other specified disorders of bladder: Secondary | ICD-10-CM | POA: Diagnosis not present

## 2021-02-09 DIAGNOSIS — K2289 Other specified disease of esophagus: Secondary | ICD-10-CM | POA: Diagnosis not present

## 2021-02-09 DIAGNOSIS — K449 Diaphragmatic hernia without obstruction or gangrene: Secondary | ICD-10-CM | POA: Diagnosis not present

## 2021-02-09 DIAGNOSIS — K219 Gastro-esophageal reflux disease without esophagitis: Secondary | ICD-10-CM | POA: Diagnosis not present

## 2021-02-09 DIAGNOSIS — R1310 Dysphagia, unspecified: Secondary | ICD-10-CM | POA: Diagnosis not present

## 2021-02-09 DIAGNOSIS — K224 Dyskinesia of esophagus: Secondary | ICD-10-CM | POA: Diagnosis not present

## 2021-02-13 DIAGNOSIS — F329 Major depressive disorder, single episode, unspecified: Secondary | ICD-10-CM | POA: Diagnosis not present

## 2021-02-13 DIAGNOSIS — E785 Hyperlipidemia, unspecified: Secondary | ICD-10-CM | POA: Diagnosis not present

## 2021-03-04 DIAGNOSIS — Z23 Encounter for immunization: Secondary | ICD-10-CM | POA: Diagnosis not present

## 2021-03-04 DIAGNOSIS — Z789 Other specified health status: Secondary | ICD-10-CM | POA: Diagnosis not present

## 2021-03-04 DIAGNOSIS — Z452 Encounter for adjustment and management of vascular access device: Secondary | ICD-10-CM | POA: Diagnosis not present

## 2021-03-05 DIAGNOSIS — R509 Fever, unspecified: Secondary | ICD-10-CM | POA: Diagnosis not present

## 2021-03-05 DIAGNOSIS — T50B95A Adverse effect of other viral vaccines, initial encounter: Secondary | ICD-10-CM | POA: Diagnosis not present

## 2021-03-05 DIAGNOSIS — Z6821 Body mass index (BMI) 21.0-21.9, adult: Secondary | ICD-10-CM | POA: Diagnosis not present

## 2021-03-09 DIAGNOSIS — Z1152 Encounter for screening for COVID-19: Secondary | ICD-10-CM | POA: Diagnosis not present

## 2021-03-09 DIAGNOSIS — Z20822 Contact with and (suspected) exposure to covid-19: Secondary | ICD-10-CM | POA: Diagnosis not present

## 2021-03-09 DIAGNOSIS — R5381 Other malaise: Secondary | ICD-10-CM | POA: Diagnosis not present

## 2021-03-09 DIAGNOSIS — R0602 Shortness of breath: Secondary | ICD-10-CM | POA: Diagnosis not present

## 2021-03-09 DIAGNOSIS — J029 Acute pharyngitis, unspecified: Secondary | ICD-10-CM | POA: Diagnosis not present

## 2021-03-10 ENCOUNTER — Emergency Department (HOSPITAL_COMMUNITY): Payer: Medicare Other

## 2021-03-10 ENCOUNTER — Other Ambulatory Visit: Payer: Self-pay

## 2021-03-10 ENCOUNTER — Encounter (HOSPITAL_COMMUNITY): Payer: Self-pay

## 2021-03-10 ENCOUNTER — Emergency Department (HOSPITAL_COMMUNITY)
Admission: EM | Admit: 2021-03-10 | Discharge: 2021-03-10 | Disposition: A | Discharge status: Home / Self Care | Payer: Medicare Other | Attending: Emergency Medicine | Admitting: Emergency Medicine

## 2021-03-10 DIAGNOSIS — R140 Abdominal distension (gaseous): Secondary | ICD-10-CM | POA: Diagnosis not present

## 2021-03-10 DIAGNOSIS — R509 Fever, unspecified: Secondary | ICD-10-CM | POA: Diagnosis not present

## 2021-03-10 DIAGNOSIS — R519 Headache, unspecified: Secondary | ICD-10-CM | POA: Diagnosis not present

## 2021-03-10 DIAGNOSIS — E860 Dehydration: Secondary | ICD-10-CM

## 2021-03-10 DIAGNOSIS — R109 Unspecified abdominal pain: Secondary | ICD-10-CM | POA: Diagnosis not present

## 2021-03-10 DIAGNOSIS — R000 Tachycardia, unspecified: Secondary | ICD-10-CM | POA: Insufficient documentation

## 2021-03-10 DIAGNOSIS — R1110 Vomiting, unspecified: Secondary | ICD-10-CM | POA: Insufficient documentation

## 2021-03-10 DIAGNOSIS — R197 Diarrhea, unspecified: Secondary | ICD-10-CM | POA: Diagnosis not present

## 2021-03-10 DIAGNOSIS — Z7982 Long term (current) use of aspirin: Secondary | ICD-10-CM | POA: Diagnosis not present

## 2021-03-10 DIAGNOSIS — Z20822 Contact with and (suspected) exposure to covid-19: Secondary | ICD-10-CM | POA: Insufficient documentation

## 2021-03-10 DIAGNOSIS — R10816 Epigastric abdominal tenderness: Secondary | ICD-10-CM | POA: Insufficient documentation

## 2021-03-10 DIAGNOSIS — R112 Nausea with vomiting, unspecified: Secondary | ICD-10-CM | POA: Diagnosis not present

## 2021-03-10 DIAGNOSIS — K529 Noninfective gastroenteritis and colitis, unspecified: Secondary | ICD-10-CM | POA: Diagnosis not present

## 2021-03-10 DIAGNOSIS — R0602 Shortness of breath: Secondary | ICD-10-CM | POA: Diagnosis not present

## 2021-03-10 DIAGNOSIS — I10 Essential (primary) hypertension: Secondary | ICD-10-CM | POA: Diagnosis not present

## 2021-03-10 DIAGNOSIS — R059 Cough, unspecified: Secondary | ICD-10-CM | POA: Diagnosis not present

## 2021-03-10 LAB — CBC WITH DIFFERENTIAL/PLATELET
Abs Immature Granulocytes: 0.04 10*3/uL (ref 0.00–0.07)
Basophils Absolute: 0.1 10*3/uL (ref 0.0–0.1)
Basophils Relative: 1 %
Eosinophils Absolute: 0.1 10*3/uL (ref 0.0–0.5)
Eosinophils Relative: 1 %
HCT: 43.2 % (ref 39.0–52.0)
Hemoglobin: 14.5 g/dL (ref 13.0–17.0)
Immature Granulocytes: 0 %
Lymphocytes Relative: 21 %
Lymphs Abs: 2 10*3/uL (ref 0.7–4.0)
MCH: 31.5 pg (ref 26.0–34.0)
MCHC: 33.6 g/dL (ref 30.0–36.0)
MCV: 93.9 fL (ref 80.0–100.0)
Monocytes Absolute: 0.6 10*3/uL (ref 0.1–1.0)
Monocytes Relative: 6 %
Neutro Abs: 6.6 10*3/uL (ref 1.7–7.7)
Neutrophils Relative %: 71 %
Platelets: 223 10*3/uL (ref 150–400)
RBC: 4.6 MIL/uL (ref 4.22–5.81)
RDW: 12.4 % (ref 11.5–15.5)
WBC: 9.2 10*3/uL (ref 4.0–10.5)
nRBC: 0 % (ref 0.0–0.2)

## 2021-03-10 LAB — COMPREHENSIVE METABOLIC PANEL
ALT: 22 U/L (ref 0–44)
AST: 27 U/L (ref 15–41)
Albumin: 3.9 g/dL (ref 3.5–5.0)
Alkaline Phosphatase: 73 U/L (ref 38–126)
Anion gap: 11 (ref 5–15)
BUN: 17 mg/dL (ref 6–20)
CO2: 26 mmol/L (ref 22–32)
Calcium: 9.4 mg/dL (ref 8.9–10.3)
Chloride: 105 mmol/L (ref 98–111)
Creatinine, Ser: 1 mg/dL (ref 0.61–1.24)
GFR, Estimated: >60 mL/min (ref >60)
Glucose, Bld: 97 mg/dL (ref 70–99)
Potassium: 4.3 mmol/L (ref 3.5–5.1)
Sodium: 142 mmol/L (ref 135–145)
Total Bilirubin: 0.8 mg/dL (ref 0.3–1.2)
Total Protein: 7.4 g/dL (ref 6.5–8.1)

## 2021-03-10 LAB — URINALYSIS, ROUTINE W REFLEX MICROSCOPIC
Bilirubin Urine: NEGATIVE
Glucose, UA: NEGATIVE mg/dL
Hgb urine dipstick: NEGATIVE
Ketones, ur: 5 mg/dL — AB
Leukocytes,Ua: NEGATIVE
Nitrite: NEGATIVE
Protein, ur: NEGATIVE mg/dL
Specific Gravity, Urine: 1.009 (ref 1.005–1.030)
pH: 6 (ref 5.0–8.0)

## 2021-03-10 LAB — TROPONIN I (HIGH SENSITIVITY): Troponin I (High Sensitivity): 4 ng/L (ref <18)

## 2021-03-10 LAB — LIPASE, BLOOD: Lipase: 27 U/L (ref 11–51)

## 2021-03-10 LAB — RESP PANEL BY RT-PCR (FLU A&B, COVID) ARPGX2
Influenza A by PCR: NEGATIVE
Influenza B by PCR: NEGATIVE
SARS Coronavirus 2 by RT PCR: NEGATIVE

## 2021-03-10 LAB — LACTIC ACID, PLASMA: Lactic Acid, Venous: 0.9 mmol/L (ref 0.5–1.9)

## 2021-03-10 LAB — CK: Total CK: 240 U/L (ref 49–397)

## 2021-03-10 MED ORDER — SODIUM CHLORIDE 0.9 % IV BOLUS
1000.0000 mL | Freq: Once | INTRAVENOUS | Status: AC
  Administered 2021-03-10: 1000 mL via INTRAVENOUS

## 2021-03-10 MED ORDER — SODIUM CHLORIDE 0.9 % IV SOLN
25.0000 mg | Freq: Four times a day (QID) | INTRAVENOUS | Status: DC | PRN
  Administered 2021-03-10: 25 mg via INTRAVENOUS
  Filled 2021-03-10: qty 25

## 2021-03-10 MED ORDER — HEPARIN SOD (PORK) LOCK FLUSH 100 UNIT/ML IV SOLN
500.0000 [IU] | Freq: Once | INTRAVENOUS | Status: AC
  Administered 2021-03-10: 500 [IU]
  Filled 2021-03-10: qty 5

## 2021-03-10 MED ORDER — HYDROCODONE-HOMATROPINE 5-1.5 MG/5ML PO SYRP
5.0000 mL | ORAL_SOLUTION | Freq: Four times a day (QID) | ORAL | 0 refills | Status: DC | PRN
Start: 2021-03-10 — End: 2022-07-14

## 2021-03-10 MED ORDER — HYDROCODONE-HOMATROPINE 5-1.5 MG/5ML PO SYRP
5.0000 mL | ORAL_SOLUTION | Freq: Once | ORAL | Status: AC
  Administered 2021-03-10: 5 mL via ORAL
  Filled 2021-03-10: qty 5

## 2021-03-10 MED ORDER — FENTANYL CITRATE (PF) 100 MCG/2ML IJ SOLN
50.0000 ug | Freq: Once | INTRAMUSCULAR | Status: AC
  Administered 2021-03-10: 50 ug via INTRAVENOUS
  Filled 2021-03-10: qty 2

## 2021-03-10 NOTE — ED Triage Notes (Signed)
Per patient covid booster Wednesday and Thursday started having cough, N/V/D, headache, SHOB, fever. Went to MD yesterday flu and strep test was negative.

## 2021-03-10 NOTE — ED Provider Notes (Signed)
[facility]-EMERGENCY DEPT Provider Note   CSN: [id number] Arrival date & time: 03/10/21  1603     History Chief Complaint  Patient presents with  . Vomiting  . Diarrhea  . Headache    Edwin DoverWilliam E Newcombe is a 55 y.o. male hx HTN, HL, here presenting with diarrhea and headaches and fevers.  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  Patient states that he was feeling fine on Thursday and starting Friday he has persistent fevers.  He states that the fevers of 101-102.  Patient also has some headaches and abdominal pain.  He also has some vomiting and diarrhea as well.  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  He did go to urgent care had a negative flu and COVID test. However he was still running fevers and has persistent vomiting and headache so came here for further evaluation.  Patient has a history of IBS  The history is provided by the patient.       Past Medical History:  Diagnosis Date  . Complication of anesthesia    nasuea and vomiting  . Food allergy   . Headache   . Hiatal hernia   . Hyperlipidemia   . IBS (irritable bowel syndrome)   . Iron deficiency anemia   . Urticaria     Patient Active Problem List   Diagnosis Date Noted  . FTT (failure to thrive) in adult 10/04/2020  . Intractable nausea and vomiting 10/04/2020  . Intractable vomiting 06/26/2020  . Severe protein-calorie malnutrition Lily Kocher(Gomez: less than 60% of standard weight) (HCC) 06/26/2020  . IBS (irritable bowel syndrome) 06/26/2020  . Dyslipidemia 06/26/2020  . HTN (hypertension) 06/26/2020  . Iron deficiency anemia due to dietary causes 06/26/2020  . Pneumothorax 05/15/2020  . Nausea & vomiting 05/15/2020  . Unintentional weight loss 05/15/2020    Past Surgical History:  Procedure Laterality Date  . ABDOMINAL SURGERY  12/2015   pylorus plasty  . APPENDECTOMY  1984  . BIOPSY  05/18/2020   Procedure: BIOPSY;   Surgeon: Kerin SalenKarki, Arya, MD;  Location: [facility];  Service: Gastroenterology;;  . Ephriam KnucklesHOLECYSTECTOMY  2008  . ESOPHAGOGASTRODUODENOSCOPY (EGD) WITH PROPOFOL N/A 05/18/2020   Procedure: ESOPHAGOGASTRODUODENOSCOPY (EGD) WITH PROPOFOL;  Surgeon: Kerin SalenKarki, Arya, MD;  Location: [facility];  Service: Gastroenterology;  Laterality: N/A;  . GASTROSTOMY TUBE PLACEMENT    . HIATAL HERNIA REPAIR  07/2015  . TALC PLEURODESIS    . TONSILLECTOMY  1980  . VENTRAL HERNIA REPAIR  2010       Family History  Problem Relation Age of Onset  . Parkinson's disease Father   . Heart disease Father   . Angioedema Mother   . Allergic rhinitis Neg Hx   . Asthma Neg Hx   . Eczema Neg Hx   . Immunodeficiency Neg Hx   . Urticaria Neg Hx     Social History   Tobacco Use  . Smoking status: Never Smoker  . Smokeless tobacco: Never Used  Vaping Use  . Vaping Use: Never used  Substance Use Topics  . Alcohol use: No  . Drug use: No    Home Medications Prior to Admission medications   Medication Sig Start Date End Date Taking? Authorizing Provider  acetaminophen (TYLENOL) 500 MG tablet Take 1,000 mg by mouth every 6 (six) hours as needed for moderate pain.    [provider]  amoxicillin-clavulanate (AUGMENTIN) 875-125 MG tablet  Take 1 tablet by mouth 2 (two) times daily. Start date : 10/06/20.    [provider]  aspirin 81 MG EC tablet Take 81 mg by mouth daily. Swallow whole.    [provider]  Cholecalciferol (VITAMIN D3 PO) Take 250 mcg by mouth daily.     [provider]  Cyanocobalamin (VITAMIN B 12 PO) Take 1 capsule by mouth daily.    [provider]  cyclobenzaprine (FLEXERIL) 10 MG tablet Take 10 mg by mouth 3 (three) times daily as needed for muscle spasms.    [provider]  Ferrous Sulfate (IRON) 325 (65 Fe) MG TABS Take 325 mg by mouth every other day.  05/12/20   [provider]  gabapentin (NEURONTIN) 300 MG capsule Take 300 mg by  mouth daily.  03/29/20   [provider]  Ginkgo Biloba 40 MG TABS Take 40 mg by mouth daily.     [provider]  ipratropium (ATROVENT) 0.06 % nasal spray USE TWO SPRAYS IN EACH NOSTRIL TWICE DAILY AS DIRECTED. Patient taking differently: Place 1 spray into both nostrils daily as needed for rhinitis.  05/22/20   Marcelyn Bruins, MD  LINZESS 290 MCG CAPS capsule Take 290 mcg by mouth daily as needed (abdominal cramping).  07/03/20   [provider]  mirtazapine (REMERON) 15 MG tablet Take 15 mg by mouth at bedtime. 02/08/20   [provider]  Multiple Vitamin (MULTIVITAMIN WITH MINERALS) TABS tablet Take 1 tablet by mouth daily.    [provider]  oxyCODONE (OXY IR/ROXICODONE) 5 MG immediate release tablet Take 1 tablet (5 mg total) by mouth every 6 (six) hours as needed for up to 10 doses for severe pain. 05/20/20   Lanae Boast, MD  pantoprazole (PROTONIX) 40 MG tablet Take 1 tablet (40 mg total) by mouth 2 (two) times daily. 05/20/20 10/17/20  Lanae Boast, MD  promethazine (PHENERGAN) 25 MG tablet Take 1 tablet (25 mg total) by mouth every 6 (six) hours as needed for up to 7 days for nausea or vomiting. Patient taking differently: Take 25 mg by mouth every 6 (six) hours as needed for nausea or vomiting. Nausea 05/20/20 10/17/20  Lanae Boast, MD  rosuvastatin (CRESTOR) 5 MG tablet Take 5 mg by mouth once a week. Sundays 05/29/20   [provider]  sucralfate (CARAFATE) 1 g tablet Take 1 tablet (1 g total) by mouth 4 (four) times daily -  with meals and at bedtime. 10/17/20   Tilden Fossa, MD  tamsulosin (FLOMAX) 0.4 MG CAPS capsule Take 0.4 mg by mouth daily.    [provider]  traZODone (DESYREL) 50 MG tablet Take 50 mg by mouth at bedtime as needed for sleep.  07/31/20   [provider]  Zinc 50 MG TABS Take 50 mg by mouth daily.     [provider]  azelastine (ASTELIN) 0.1 % nasal spray 2 sprays per nostril twice a day  for control of nasal drainage. Patient not taking: Reported on 05/02/2020 11/27/19 05/02/20  Marcelyn Bruins, MD    Allergies    Contrast media [iodinated diagnostic agents], Other, Shellfish allergy, Reglan [metoclopramide], Iodine, Tetracyclines & related, and Zofran [ondansetron hcl]  Review of Systems   Review of Systems  Gastrointestinal: Positive for abdominal pain, diarrhea and vomiting.  Neurological: Positive for headaches.  All other systems reviewed and are negative.   Physical Exam Updated Vital Signs BP (!) 119/102   Pulse 77   Temp 98  F (36.7 C) (Oral)   Resp (!) 24   Ht 5\' 8"  (1.727 m)   Wt 60.8 kg   SpO2 99%   BMI 20.37 kg/m   Physical Exam Vitals and nursing note reviewed.  Constitutional:      Comments: Uncomfortable, vomiting  HENT:     Head: Normocephalic and atraumatic.     Mouth/Throat:     Pharynx: Oropharynx is clear.  Eyes:     Extraocular Movements: Extraocular movements intact.     Pupils: Pupils are equal, round, and reactive to light.  Neck:     Comments: No meningeal sign Cardiovascular:     Rate and Rhythm: Regular rhythm. Tachycardia present.     Heart sounds: Normal heart sounds.  Pulmonary:     Effort: Pulmonary effort is normal.     Breath sounds: Normal breath sounds.  Abdominal:     General: Bowel sounds are normal.     Palpations: Abdomen is soft.     Comments: Mild epigastric tenderness  Musculoskeletal:        General: Normal range of motion.     Cervical back: Normal range of motion and neck supple.  Neurological:     Mental Status: He is alert and oriented to person, place, and time.     Comments: Cranial nerves II to XII intact and patient has normal strength throughout  Psychiatric:        Mood and Affect: Mood normal.        Behavior: Behavior normal.     ED Results / Procedures / Treatments   Labs (all labs ordered are listed, but only abnormal results are displayed) Labs Reviewed  URINALYSIS,  ROUTINE W REFLEX MICROSCOPIC - Abnormal; Notable for the following components:      Result Value   Ketones, ur 5 (*)    All other components within normal limits  RESP PANEL BY RT-PCR (FLU A&B, COVID) ARPGX2  CULTURE, BLOOD (ROUTINE X 2)  CULTURE, BLOOD (ROUTINE X 2)  CBC WITH DIFFERENTIAL/PLATELET  COMPREHENSIVE METABOLIC PANEL  LIPASE, BLOOD  LACTIC ACID, PLASMA  CK  TROPONIN I (HIGH SENSITIVITY)    EKG None  Radiology CT ABDOMEN PELVIS WO CONTRAST  Result Date: 03/10/2021 CLINICAL DATA:  Abdominal pain and distension. Nausea and vomiting. Diarrhea. Fever. EXAM: CT ABDOMEN AND PELVIS WITHOUT CONTRAST TECHNIQUE: Multidetector CT imaging of the abdomen and pelvis was performed following the standard protocol without IV contrast. COMPARISON:  01/20/2021 from [facility] FINDINGS: Lower chest: No acute findings. Elevation of left hemidiaphragm again noted. Hepatobiliary: No mass visualized on this unenhanced exam. Prior cholecystectomy. No evidence of biliary obstruction. Pancreas: No mass or inflammatory process visualized on this unenhanced exam. Spleen:  Within normal limits in size. Adrenals/Urinary tract: No evidence of urolithiasis or hydronephrosis. Unremarkable unopacified urinary bladder. Stomach/Bowel: Surgical clips again seen in area of the gastric fundus, presumably due to previous fundoplication. No evidence of obstruction, inflammatory process, or abnormal fluid collections. Vascular/Lymphatic: No pathologically enlarged lymph nodes identified. No evidence of abdominal aortic aneurysm. Aortic atherosclerotic calcification noted. Reproductive:  No mass or other significant abnormality. Other:  None. Musculoskeletal:  No suspicious bone lesions identified. IMPRESSION: No acute findings in the abdomen or pelvis. Aortic Atherosclerosis (ICD10-I70.0). Electronically Signed   By: [facility] M.D.   On: 03/10/2021 19:06   CT Head Wo Contrast  Result Date: 03/10/2021 CLINICAL  DATA:  Headache.  Nausea and vomiting. EXAM: CT HEAD WITHOUT CONTRAST TECHNIQUE: Contiguous axial images were obtained from the  base of the skull through the vertex without intravenous contrast. COMPARISON:  06/24/2017 from [facility] FINDINGS: Brain: No evidence of acute infarction, hemorrhage, hydrocephalus, extra-axial collection, or mass lesion/mass effect. Vascular:  No hyperdense vessel or other acute findings. Skull: No evidence of fracture or other significant bone abnormality. Sinuses/Orbits:  No acute findings. Other: None. IMPRESSION: Negative noncontrast head CT. Electronically Signed   By: Danae Orleans M.D.   On: 03/10/2021 18:58   DG Chest Port 1 View  Result Date: 03/10/2021 CLINICAL DATA:  Shortness of breath.  Cough.  Fever. EXAM: PORTABLE CHEST 1 VIEW COMPARISON:  01/14/2021 and 10/01/2020 FINDINGS: Heart size remains normal. Left-sided power port remains in appropriate position. Elevation of left hemidiaphragm and left basilar scarring remains stable. No evidence of pulmonary infiltrate or edema. No evidence of pleural effusion. Multiple surgical clips are seen in the lower mediastinum and epigastric region. IMPRESSION: Stable exam. No active disease. Electronically Signed   By: Danae Orleans M.D.   On: 03/10/2021 19:14    Procedures Procedures   Medications Ordered in ED Medications  promethazine (PHENERGAN) 25 mg in sodium chloride 0.9 % 50 mL IVPB (0 mg Intravenous Stopped 03/10/21 1949)  sodium chloride 0.9 % bolus 1,000 mL (0 mLs Intravenous Stopped 03/10/21 1949)  fentaNYL (SUBLIMAZE) injection 50 mcg (50 mcg Intravenous Given 03/10/21 1838)  HYDROcodone-homatropine (HYCODAN) 5-1.5 MG/5ML syrup 5 mL (5 mLs Oral Given 03/10/21 1949)    ED Course  I have reviewed the triage vital signs and the nursing notes.  Pertinent labs & imaging results that were available during my care of the patient were reviewed by me and considered in my medical decision making (see chart for  details).    MDM Rules/Calculators/A&P                         AXTEN PASCUCCI is a 55 y.o. male who presented with headache and vomiting and fevers.  Patient did receive his COVID booster 6 days ago.  Has been running fevers for the last 4 days.  I wonder if he has gastroenteritis or small bowel obstruction.  He has no meningeal signs to suggest meningitis.  Plan to get CBC CMP and lactate and cultures and urinalysis and chest x-ray and CT head and CT abdomen pelvis.  8:37 PM Patient's WBC is nl. Lactate nl. Flu and COVID negative.  CT head and CT abdomen pelvis unremarkable.  CK level is normal.  Patient felt better after IV fluids.  Patient has Phenergan at home.  He request cough medicine so will prescribe Hycodan as needed.  I think likely viral gastroenteritis.  Final Clinical Impression(s) / ED Diagnoses Final diagnoses:  None    Rx / DC Orders ED Discharge Orders    None       Charlynne Pander, MD 03/10/21 2039

## 2021-03-10 NOTE — Discharge Instructions (Signed)
You likely have a stomach virus.  Your labs and CT scans were unremarkable.  Take Tylenol and ibuprofen for fever.  Take Hycodan for cough  Stay hydrated.  Continue taking your Phenergan as prescribed by your doctor  See your doctor next week.  Return to ER if you have worse cough, trouble breathing, vomiting, dehydration, fever for a week.

## 2021-03-10 NOTE — ED Triage Notes (Signed)
Emergency Medicine Provider Triage Evaluation Note  Edwin Hall , a 55 y.o. male  was evaluated in triage.  Pt complains of nausea, vomiting, diarrhea, headache, shortness of breath and nighttime fevers.  Patient reports getting the COVID booster on Wednesday, and started to develop headache with body aches and fevers subsequently several days after.  He continued to have the symptoms with development of nausea, vomiting, diarrhea.  He also reports a 9 out of 10 headache.  No blurry vision.  No neck stiffness.  Seen at urgent care yesterday and had negative flu and strep test.  Continues to feel poorly with poor p.o. intake.  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  No hematemesis or blood in his stool.  Underlies abdominal pain  Review of Systems  Positive: As above Negative: As above  Physical Exam  BP (!) 129/95   Pulse 85   Temp 98.2 F (36.8 C) (Oral)   Resp 18   Ht 5\' 8"  (1.727 m)   Wt 60.8 kg   SpO2 100%   BMI 20.37 kg/m  Gen:   Awake, no distress   HEENT:  Atraumatic  Resp:  Normal effort  Cardiac:  Normal rate  Abd:   Generalized nonfocal abdominal tenderness MSK:   Moves extremities without difficulty  Neuro:  Speech clear   Medical Decision Making  Medically screening exam initiated at 4:21 PM.  Appropriate orders placed.  was informed that the remainder of the evaluation will be completed by another provider, this initial triage assessment does not replace that evaluation, and the importance of remaining in the ED until their evaluation is complete.  Clinical Impression  Stable further evaluation by provider   [name], PA-C 03/10/21 1623

## 2021-03-13 DIAGNOSIS — J069 Acute upper respiratory infection, unspecified: Secondary | ICD-10-CM | POA: Diagnosis not present

## 2021-03-13 DIAGNOSIS — Z6820 Body mass index (BMI) 20.0-20.9, adult: Secondary | ICD-10-CM | POA: Diagnosis not present

## 2021-03-13 DIAGNOSIS — R059 Cough, unspecified: Secondary | ICD-10-CM | POA: Diagnosis not present

## 2021-03-15 DIAGNOSIS — F329 Major depressive disorder, single episode, unspecified: Secondary | ICD-10-CM | POA: Diagnosis not present

## 2021-03-15 DIAGNOSIS — E785 Hyperlipidemia, unspecified: Secondary | ICD-10-CM | POA: Diagnosis not present

## 2021-03-15 LAB — CULTURE, BLOOD (ROUTINE X 2)
Culture: NO GROWTH
Culture: NO GROWTH
Special Requests: ADEQUATE
Special Requests: ADEQUATE

## 2021-03-30 DIAGNOSIS — R051 Acute cough: Secondary | ICD-10-CM | POA: Diagnosis not present

## 2021-03-30 DIAGNOSIS — R634 Abnormal weight loss: Secondary | ICD-10-CM | POA: Diagnosis not present

## 2021-04-06 DIAGNOSIS — Z452 Encounter for adjustment and management of vascular access device: Secondary | ICD-10-CM | POA: Diagnosis not present

## 2021-04-06 DIAGNOSIS — Z789 Other specified health status: Secondary | ICD-10-CM | POA: Diagnosis not present

## 2021-04-07 DIAGNOSIS — J4540 Moderate persistent asthma, uncomplicated: Secondary | ICD-10-CM | POA: Diagnosis not present

## 2021-04-07 DIAGNOSIS — R051 Acute cough: Secondary | ICD-10-CM | POA: Diagnosis not present

## 2021-04-07 DIAGNOSIS — E559 Vitamin D deficiency, unspecified: Secondary | ICD-10-CM | POA: Diagnosis not present

## 2021-04-15 DIAGNOSIS — E7800 Pure hypercholesterolemia, unspecified: Secondary | ICD-10-CM | POA: Diagnosis not present

## 2021-04-15 DIAGNOSIS — J4520 Mild intermittent asthma, uncomplicated: Secondary | ICD-10-CM | POA: Diagnosis not present

## 2021-04-15 DIAGNOSIS — F329 Major depressive disorder, single episode, unspecified: Secondary | ICD-10-CM | POA: Diagnosis not present

## 2021-04-15 DIAGNOSIS — K2270 Barrett's esophagus without dysplasia: Secondary | ICD-10-CM | POA: Diagnosis not present

## 2021-04-15 DIAGNOSIS — E785 Hyperlipidemia, unspecified: Secondary | ICD-10-CM | POA: Diagnosis not present

## 2021-04-15 DIAGNOSIS — J45909 Unspecified asthma, uncomplicated: Secondary | ICD-10-CM | POA: Diagnosis not present

## 2021-04-15 DIAGNOSIS — R1319 Other dysphagia: Secondary | ICD-10-CM | POA: Diagnosis not present

## 2021-04-15 DIAGNOSIS — R059 Cough, unspecified: Secondary | ICD-10-CM | POA: Diagnosis not present

## 2021-04-15 DIAGNOSIS — R0602 Shortness of breath: Secondary | ICD-10-CM | POA: Diagnosis not present

## 2021-04-16 DIAGNOSIS — R0602 Shortness of breath: Secondary | ICD-10-CM | POA: Diagnosis not present

## 2021-04-16 DIAGNOSIS — R7989 Other specified abnormal findings of blood chemistry: Secondary | ICD-10-CM | POA: Diagnosis not present

## 2021-04-17 DIAGNOSIS — R051 Acute cough: Secondary | ICD-10-CM | POA: Diagnosis not present

## 2021-04-17 DIAGNOSIS — J4540 Moderate persistent asthma, uncomplicated: Secondary | ICD-10-CM | POA: Diagnosis not present

## 2021-04-23 DIAGNOSIS — R0602 Shortness of breath: Secondary | ICD-10-CM | POA: Diagnosis not present

## 2021-04-24 DIAGNOSIS — I2699 Other pulmonary embolism without acute cor pulmonale: Secondary | ICD-10-CM | POA: Diagnosis not present

## 2021-04-24 DIAGNOSIS — J4540 Moderate persistent asthma, uncomplicated: Secondary | ICD-10-CM | POA: Diagnosis not present

## 2021-04-24 DIAGNOSIS — R051 Acute cough: Secondary | ICD-10-CM | POA: Diagnosis not present

## 2021-04-24 DIAGNOSIS — R0602 Shortness of breath: Secondary | ICD-10-CM | POA: Diagnosis not present

## 2021-05-01 DIAGNOSIS — R1310 Dysphagia, unspecified: Secondary | ICD-10-CM | POA: Diagnosis not present

## 2021-05-04 DIAGNOSIS — R051 Acute cough: Secondary | ICD-10-CM | POA: Diagnosis not present

## 2021-05-04 DIAGNOSIS — J4540 Moderate persistent asthma, uncomplicated: Secondary | ICD-10-CM | POA: Diagnosis not present

## 2021-05-04 DIAGNOSIS — R0600 Dyspnea, unspecified: Secondary | ICD-10-CM | POA: Diagnosis not present

## 2021-05-05 DIAGNOSIS — D519 Vitamin B12 deficiency anemia, unspecified: Secondary | ICD-10-CM | POA: Diagnosis not present

## 2021-05-05 DIAGNOSIS — R5383 Other fatigue: Secondary | ICD-10-CM | POA: Diagnosis not present

## 2021-05-05 DIAGNOSIS — K9189 Other postprocedural complications and disorders of digestive system: Secondary | ICD-10-CM | POA: Diagnosis not present

## 2021-05-05 DIAGNOSIS — E7800 Pure hypercholesterolemia, unspecified: Secondary | ICD-10-CM | POA: Diagnosis not present

## 2021-05-05 DIAGNOSIS — R0602 Shortness of breath: Secondary | ICD-10-CM | POA: Diagnosis not present

## 2021-05-05 DIAGNOSIS — R079 Chest pain, unspecified: Secondary | ICD-10-CM | POA: Diagnosis not present

## 2021-05-05 DIAGNOSIS — E782 Mixed hyperlipidemia: Secondary | ICD-10-CM | POA: Diagnosis not present

## 2021-05-06 DIAGNOSIS — R079 Chest pain, unspecified: Secondary | ICD-10-CM | POA: Diagnosis not present

## 2021-05-06 DIAGNOSIS — Z452 Encounter for adjustment and management of vascular access device: Secondary | ICD-10-CM | POA: Diagnosis not present

## 2021-05-08 DIAGNOSIS — R0600 Dyspnea, unspecified: Secondary | ICD-10-CM | POA: Diagnosis not present

## 2021-05-08 DIAGNOSIS — E782 Mixed hyperlipidemia: Secondary | ICD-10-CM | POA: Diagnosis not present

## 2021-05-08 DIAGNOSIS — R079 Chest pain, unspecified: Secondary | ICD-10-CM | POA: Diagnosis not present

## 2021-05-08 DIAGNOSIS — R0602 Shortness of breath: Secondary | ICD-10-CM | POA: Diagnosis not present

## 2021-05-08 DIAGNOSIS — Z7982 Long term (current) use of aspirin: Secondary | ICD-10-CM | POA: Diagnosis not present

## 2021-05-14 DIAGNOSIS — R0600 Dyspnea, unspecified: Secondary | ICD-10-CM | POA: Diagnosis not present

## 2021-05-14 DIAGNOSIS — R0602 Shortness of breath: Secondary | ICD-10-CM | POA: Diagnosis not present

## 2021-05-14 DIAGNOSIS — Z9889 Other specified postprocedural states: Secondary | ICD-10-CM | POA: Diagnosis not present

## 2021-05-14 DIAGNOSIS — R079 Chest pain, unspecified: Secondary | ICD-10-CM | POA: Diagnosis not present

## 2021-05-14 DIAGNOSIS — I2510 Atherosclerotic heart disease of native coronary artery without angina pectoris: Secondary | ICD-10-CM | POA: Diagnosis not present

## 2021-05-26 DIAGNOSIS — J449 Chronic obstructive pulmonary disease, unspecified: Secondary | ICD-10-CM | POA: Diagnosis not present

## 2021-05-26 DIAGNOSIS — J45909 Unspecified asthma, uncomplicated: Secondary | ICD-10-CM | POA: Diagnosis not present

## 2021-05-27 DIAGNOSIS — Z0000 Encounter for general adult medical examination without abnormal findings: Secondary | ICD-10-CM | POA: Diagnosis not present

## 2021-05-27 DIAGNOSIS — Z9889 Other specified postprocedural states: Secondary | ICD-10-CM | POA: Diagnosis not present

## 2021-05-29 DIAGNOSIS — R109 Unspecified abdominal pain: Secondary | ICD-10-CM | POA: Diagnosis not present

## 2021-05-29 DIAGNOSIS — R1012 Left upper quadrant pain: Secondary | ICD-10-CM | POA: Diagnosis not present

## 2021-05-29 DIAGNOSIS — J45909 Unspecified asthma, uncomplicated: Secondary | ICD-10-CM | POA: Diagnosis not present

## 2021-05-29 DIAGNOSIS — J449 Chronic obstructive pulmonary disease, unspecified: Secondary | ICD-10-CM | POA: Diagnosis not present

## 2021-06-01 DIAGNOSIS — J449 Chronic obstructive pulmonary disease, unspecified: Secondary | ICD-10-CM | POA: Diagnosis not present

## 2021-06-01 DIAGNOSIS — J45909 Unspecified asthma, uncomplicated: Secondary | ICD-10-CM | POA: Diagnosis not present

## 2021-06-02 DIAGNOSIS — R0600 Dyspnea, unspecified: Secondary | ICD-10-CM | POA: Diagnosis not present

## 2021-06-02 DIAGNOSIS — R051 Acute cough: Secondary | ICD-10-CM | POA: Diagnosis not present

## 2021-06-02 DIAGNOSIS — J4540 Moderate persistent asthma, uncomplicated: Secondary | ICD-10-CM | POA: Diagnosis not present

## 2021-06-03 DIAGNOSIS — Z452 Encounter for adjustment and management of vascular access device: Secondary | ICD-10-CM | POA: Diagnosis not present

## 2021-06-03 DIAGNOSIS — J4540 Moderate persistent asthma, uncomplicated: Secondary | ICD-10-CM | POA: Diagnosis not present

## 2021-06-03 DIAGNOSIS — R0600 Dyspnea, unspecified: Secondary | ICD-10-CM | POA: Diagnosis not present

## 2021-06-03 DIAGNOSIS — R051 Acute cough: Secondary | ICD-10-CM | POA: Diagnosis not present

## 2021-06-03 DIAGNOSIS — J449 Chronic obstructive pulmonary disease, unspecified: Secondary | ICD-10-CM | POA: Diagnosis not present

## 2021-06-03 DIAGNOSIS — J45909 Unspecified asthma, uncomplicated: Secondary | ICD-10-CM | POA: Diagnosis not present

## 2021-06-08 DIAGNOSIS — J449 Chronic obstructive pulmonary disease, unspecified: Secondary | ICD-10-CM | POA: Diagnosis not present

## 2021-06-08 DIAGNOSIS — J45909 Unspecified asthma, uncomplicated: Secondary | ICD-10-CM | POA: Diagnosis not present

## 2021-06-10 DIAGNOSIS — R0602 Shortness of breath: Secondary | ICD-10-CM | POA: Diagnosis not present

## 2021-06-10 DIAGNOSIS — J449 Chronic obstructive pulmonary disease, unspecified: Secondary | ICD-10-CM | POA: Diagnosis not present

## 2021-06-10 DIAGNOSIS — J45909 Unspecified asthma, uncomplicated: Secondary | ICD-10-CM | POA: Diagnosis not present

## 2021-06-10 DIAGNOSIS — R0600 Dyspnea, unspecified: Secondary | ICD-10-CM | POA: Diagnosis not present

## 2021-06-10 DIAGNOSIS — R051 Acute cough: Secondary | ICD-10-CM | POA: Diagnosis not present

## 2021-06-10 DIAGNOSIS — J4540 Moderate persistent asthma, uncomplicated: Secondary | ICD-10-CM | POA: Diagnosis not present

## 2021-06-12 DIAGNOSIS — J45909 Unspecified asthma, uncomplicated: Secondary | ICD-10-CM | POA: Diagnosis not present

## 2021-06-12 DIAGNOSIS — J449 Chronic obstructive pulmonary disease, unspecified: Secondary | ICD-10-CM | POA: Diagnosis not present

## 2021-06-17 DIAGNOSIS — R0600 Dyspnea, unspecified: Secondary | ICD-10-CM | POA: Diagnosis not present

## 2021-06-17 DIAGNOSIS — R079 Chest pain, unspecified: Secondary | ICD-10-CM | POA: Diagnosis not present

## 2021-06-24 DIAGNOSIS — J45909 Unspecified asthma, uncomplicated: Secondary | ICD-10-CM | POA: Diagnosis not present

## 2021-06-24 DIAGNOSIS — R1310 Dysphagia, unspecified: Secondary | ICD-10-CM | POA: Diagnosis not present

## 2021-06-26 DIAGNOSIS — J45909 Unspecified asthma, uncomplicated: Secondary | ICD-10-CM | POA: Diagnosis not present

## 2021-06-30 DIAGNOSIS — Z7982 Long term (current) use of aspirin: Secondary | ICD-10-CM | POA: Diagnosis not present

## 2021-06-30 DIAGNOSIS — E782 Mixed hyperlipidemia: Secondary | ICD-10-CM | POA: Diagnosis not present

## 2021-06-30 DIAGNOSIS — J4551 Severe persistent asthma with (acute) exacerbation: Secondary | ICD-10-CM | POA: Diagnosis not present

## 2021-06-30 DIAGNOSIS — K219 Gastro-esophageal reflux disease without esophagitis: Secondary | ICD-10-CM | POA: Diagnosis not present

## 2021-06-30 DIAGNOSIS — R079 Chest pain, unspecified: Secondary | ICD-10-CM | POA: Diagnosis not present

## 2021-06-30 DIAGNOSIS — R051 Acute cough: Secondary | ICD-10-CM | POA: Diagnosis not present

## 2021-06-30 DIAGNOSIS — R0600 Dyspnea, unspecified: Secondary | ICD-10-CM | POA: Diagnosis not present

## 2021-07-01 DIAGNOSIS — G4733 Obstructive sleep apnea (adult) (pediatric): Secondary | ICD-10-CM | POA: Diagnosis not present

## 2021-07-01 DIAGNOSIS — Z711 Person with feared health complaint in whom no diagnosis is made: Secondary | ICD-10-CM | POA: Diagnosis not present

## 2021-07-01 DIAGNOSIS — Z03818 Encounter for observation for suspected exposure to other biological agents ruled out: Secondary | ICD-10-CM | POA: Diagnosis not present

## 2021-07-03 ENCOUNTER — Emergency Department (HOSPITAL_COMMUNITY): Payer: Medicare Other

## 2021-07-03 ENCOUNTER — Encounter (HOSPITAL_COMMUNITY): Payer: Self-pay

## 2021-07-03 ENCOUNTER — Other Ambulatory Visit: Payer: Self-pay

## 2021-07-03 ENCOUNTER — Emergency Department (HOSPITAL_COMMUNITY)
Admission: EM | Admit: 2021-07-03 | Discharge: 2021-07-04 | Disposition: A | Discharge status: Home / Self Care | Payer: Medicare Other | Attending: Emergency Medicine | Admitting: Emergency Medicine

## 2021-07-03 DIAGNOSIS — I10 Essential (primary) hypertension: Secondary | ICD-10-CM | POA: Insufficient documentation

## 2021-07-03 DIAGNOSIS — Z7982 Long term (current) use of aspirin: Secondary | ICD-10-CM | POA: Insufficient documentation

## 2021-07-03 DIAGNOSIS — R112 Nausea with vomiting, unspecified: Secondary | ICD-10-CM

## 2021-07-03 DIAGNOSIS — R0789 Other chest pain: Secondary | ICD-10-CM | POA: Diagnosis not present

## 2021-07-03 DIAGNOSIS — R1084 Generalized abdominal pain: Secondary | ICD-10-CM | POA: Diagnosis not present

## 2021-07-03 DIAGNOSIS — U071 COVID-19: Secondary | ICD-10-CM | POA: Insufficient documentation

## 2021-07-03 DIAGNOSIS — R0602 Shortness of breath: Secondary | ICD-10-CM | POA: Diagnosis not present

## 2021-07-03 DIAGNOSIS — R197 Diarrhea, unspecified: Secondary | ICD-10-CM

## 2021-07-03 DIAGNOSIS — R1110 Vomiting, unspecified: Secondary | ICD-10-CM | POA: Diagnosis not present

## 2021-07-03 DIAGNOSIS — I700 Atherosclerosis of aorta: Secondary | ICD-10-CM | POA: Diagnosis not present

## 2021-07-03 DIAGNOSIS — Z79899 Other long term (current) drug therapy: Secondary | ICD-10-CM | POA: Diagnosis not present

## 2021-07-03 DIAGNOSIS — R079 Chest pain, unspecified: Secondary | ICD-10-CM

## 2021-07-03 DIAGNOSIS — R1012 Left upper quadrant pain: Secondary | ICD-10-CM | POA: Diagnosis not present

## 2021-07-03 DIAGNOSIS — R109 Unspecified abdominal pain: Secondary | ICD-10-CM | POA: Diagnosis not present

## 2021-07-03 DIAGNOSIS — R062 Wheezing: Secondary | ICD-10-CM | POA: Diagnosis not present

## 2021-07-03 LAB — LIPASE, BLOOD: Lipase: 26 U/L (ref 11–51)

## 2021-07-03 LAB — TROPONIN I (HIGH SENSITIVITY)
Troponin I (High Sensitivity): 9 ng/L (ref <18)
Troponin I (High Sensitivity): 9 ng/L (ref <18)

## 2021-07-03 LAB — D-DIMER, QUANTITATIVE: D-Dimer, Quant: 0.44 ug/mL-FEU (ref 0.00–0.50)

## 2021-07-03 LAB — URINALYSIS, ROUTINE W REFLEX MICROSCOPIC
Bilirubin Urine: NEGATIVE
Glucose, UA: NEGATIVE mg/dL
Hgb urine dipstick: NEGATIVE
Ketones, ur: 5 mg/dL — AB
Leukocytes,Ua: NEGATIVE
Nitrite: NEGATIVE
Protein, ur: NEGATIVE mg/dL
Specific Gravity, Urine: 1.005 (ref 1.005–1.030)
pH: 6 (ref 5.0–8.0)

## 2021-07-03 LAB — CBC
HCT: 44.2 % (ref 39.0–52.0)
Hemoglobin: 15 g/dL (ref 13.0–17.0)
MCH: 32.3 pg (ref 26.0–34.0)
MCHC: 33.9 g/dL (ref 30.0–36.0)
MCV: 95.3 fL (ref 80.0–100.0)
Platelets: 180 10*3/uL (ref 150–400)
RBC: 4.64 MIL/uL (ref 4.22–5.81)
RDW: 12.6 % (ref 11.5–15.5)
WBC: 4.8 10*3/uL (ref 4.0–10.5)
nRBC: 0 % (ref 0.0–0.2)

## 2021-07-03 LAB — COMPREHENSIVE METABOLIC PANEL
ALT: 45 U/L — ABNORMAL HIGH (ref 0–44)
AST: 37 U/L (ref 15–41)
Albumin: 3.8 g/dL (ref 3.5–5.0)
Alkaline Phosphatase: 64 U/L (ref 38–126)
Anion gap: 10 (ref 5–15)
BUN: 20 mg/dL (ref 6–20)
CO2: 25 mmol/L (ref 22–32)
Calcium: 8.7 mg/dL — ABNORMAL LOW (ref 8.9–10.3)
Chloride: 102 mmol/L (ref 98–111)
Creatinine, Ser: 0.84 mg/dL (ref 0.61–1.24)
GFR, Estimated: >60 mL/min (ref >60)
Glucose, Bld: 131 mg/dL — ABNORMAL HIGH (ref 70–99)
Potassium: 3.5 mmol/L (ref 3.5–5.1)
Sodium: 137 mmol/L (ref 135–145)
Total Bilirubin: 0.6 mg/dL (ref 0.3–1.2)
Total Protein: 6.7 g/dL (ref 6.5–8.1)

## 2021-07-03 MED ORDER — HEPARIN SOD (PORK) LOCK FLUSH 100 UNIT/ML IV SOLN
500.0000 [IU] | Freq: Once | INTRAVENOUS | Status: AC
  Administered 2021-07-04: 500 [IU]
  Filled 2021-07-03: qty 5

## 2021-07-03 MED ORDER — MORPHINE SULFATE (PF) 4 MG/ML IV SOLN
4.0000 mg | Freq: Once | INTRAVENOUS | Status: AC
  Administered 2021-07-03: 4 mg via INTRAVENOUS
  Filled 2021-07-03: qty 1

## 2021-07-03 MED ORDER — SODIUM CHLORIDE 0.9 % IV BOLUS
1000.0000 mL | Freq: Once | INTRAVENOUS | Status: AC
  Administered 2021-07-03: 1000 mL via INTRAVENOUS

## 2021-07-03 MED ORDER — DICYCLOMINE HCL 20 MG PO TABS: 20.0000 mg | ORAL_TABLET | Freq: Two times a day (BID) | ORAL | 0 refills | Status: DC

## 2021-07-03 MED ORDER — ALBUTEROL SULFATE HFA 108 (90 BASE) MCG/ACT IN AERS
4.0000 | INHALATION_SPRAY | Freq: Once | RESPIRATORY_TRACT | Status: AC
  Administered 2021-07-03: 4 via RESPIRATORY_TRACT
  Filled 2021-07-03: qty 26.8

## 2021-07-03 MED ORDER — SODIUM CHLORIDE 0.9 % IV SOLN
25.0000 mg | Freq: Once | INTRAVENOUS | Status: AC
  Administered 2021-07-03: 25 mg via INTRAVENOUS
  Filled 2021-07-03: qty 25

## 2021-07-03 MED ORDER — PROCHLORPERAZINE EDISYLATE 10 MG/2ML IJ SOLN
10.0000 mg | Freq: Once | INTRAMUSCULAR | Status: AC
  Administered 2021-07-03: 10 mg via INTRAVENOUS
  Filled 2021-07-03: qty 2

## 2021-07-03 MED ORDER — FAMOTIDINE IN NACL 20-0.9 MG/50ML-% IV SOLN
20.0000 mg | Freq: Once | INTRAVENOUS | Status: AC
  Administered 2021-07-03: 20 mg via INTRAVENOUS
  Filled 2021-07-03: qty 50

## 2021-07-03 NOTE — ED Triage Notes (Signed)
Patient reports that he was Covid positive x 2 days. Patient c/o abdominal pain, emesis, and diarrhea.

## 2021-07-03 NOTE — Discharge Instructions (Addendum)
Your evaluation today has been reassuring no signs of any acute issues with her heart and CT of your belly is clear, despite vomiting and diarrhea your electrolytes look good.  Please drink clear liquids and small amounts for the rest of the night and as long as you are keeping these down you can slowly advance your diet to bland foods.  Take Phenergan prescribed by your PCP schedule for the next 24 hours and then as needed.  Use Bentyl as needed for abdominal cramping and pain.  Continue taking your Protonix regularly.  Use Tylenol to help with pain, body aches and fevers.  Continue using cough medication and albuterol inhaler as needed.  Follow-up with your PCP if symptoms not improving.  Return to the ED for new or worsening symptoms.  COVID typically lasts 7 to 10 days, hopefully her symptoms will start to improve in the next day or 2.

## 2021-07-03 NOTE — ED Provider Notes (Signed)
[facility]-EMERGENCY DEPT Provider Note   CSN: [id number] Arrival date & time: 07/03/21  1732     History Chief Complaint  Patient presents with   Covid Positive   Abdominal Pain   Diarrhea   Emesis    Edwin Hall is a 55 y.o. male.  DONNIE PANIK is a 55 y.o. male with hx of IBS, headache, anemia, pneumothorax, who presents to ED for evaluation of persistent nausea, vomiting and dirarrhea. Pt reports symptoms started 4 days ago on Tuesday night when he started feeling nauseated and having some diarrhea. He reports the next morning he had an appointment with his pulmonologist for some ongoing issues with asthma. His doctor tested him for COVID, and this was positive he was treated with albuterol, steroids and levaquin for his breathing, they did not do a chest x-ray at the appointment. Pt reports his PCP called him in phenergan, but he has not been able to keep it or any of his other medications down. He has tried to keep down gatorade without success. Reports he is having watery bowel movements several times a day, almost every hour. Reports persistent associated  abdominal pain that is worst in the left upper quandrant, and has gotten worse over the past two days. Pt reports last night he started having some chest pain that has been intermittent. Pain sometimes worse with inspiration. No known fevers with COVID. 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  The history is provided by the patient.      Past Medical History:  Diagnosis Date   Complication of anesthesia    nasuea and vomiting   Food allergy    Headache    Hiatal hernia    Hyperlipidemia    IBS (irritable bowel syndrome)    Iron deficiency anemia    Urticaria     Patient Active Problem List   Diagnosis Date Noted   FTT (failure to thrive) in adult 10/04/2020   Intractable nausea and vomiting 10/04/2020   Intractable vomiting  06/26/2020   Severe protein-calorie malnutrition Lily Kocher: less than 60% of standard weight) (HCC) 06/26/2020   IBS (irritable bowel syndrome) 06/26/2020   Dyslipidemia 06/26/2020   HTN (hypertension) 06/26/2020   Iron deficiency anemia due to dietary causes 06/26/2020   Pneumothorax 05/15/2020   Nausea & vomiting 05/15/2020   Unintentional weight loss 05/15/2020    Past Surgical History:  Procedure Laterality Date   ABDOMINAL SURGERY  12/2015   pylorus plasty   APPENDECTOMY  1984   BIOPSY  05/18/2020   Procedure: BIOPSY;  Surgeon: Kerin Salen, MD;  Location: [facility];  Service: Gastroenterology;;   CHOLECYSTECTOMY  2008   ESOPHAGOGASTRODUODENOSCOPY (EGD) WITH PROPOFOL N/A 05/18/2020   Procedure: ESOPHAGOGASTRODUODENOSCOPY (EGD) WITH PROPOFOL;  Surgeon: Kerin Salen, MD;  Location: [facility];  Service: Gastroenterology;  Laterality: N/A;   GASTROSTOMY TUBE PLACEMENT     HIATAL HERNIA REPAIR  07/2015   TALC PLEURODESIS     TONSILLECTOMY  1980   VENTRAL HERNIA REPAIR  2010       Family History  Problem Relation Age of Onset   Parkinson's disease Father    Heart disease Father    Angioedema Mother    Allergic rhinitis Neg Hx    Asthma Neg Hx    Eczema Neg Hx    Immunodeficiency Neg Hx    Urticaria Neg Hx     Social History   Tobacco Use  Smoking status: Never   Smokeless tobacco: Never  Vaping Use   Vaping Use: Never used  Substance Use Topics   Alcohol use: No   Drug use: No    Home Medications Prior to Admission medications   Medication Sig Start Date End Date Taking? Authorizing Provider  dicyclomine (BENTYL) 20 MG tablet Take 1 tablet (20 mg total) by mouth 2 (two) times daily. 07/03/21  Yes Dartha Lodge, PA-C  acetaminophen (TYLENOL) 500 MG tablet Take 1,000 mg by mouth every 6 (six) hours as needed for moderate pain.    [provider]  amoxicillin-clavulanate (AUGMENTIN) 875-125 MG tablet Take 1 tablet by mouth 2 (two) times daily. Start date  : 10/06/20.    [provider]  aspirin 81 MG EC tablet Take 81 mg by mouth daily. Swallow whole.    [provider]  Cholecalciferol (VITAMIN D3 PO) Take 250 mcg by mouth daily.     [provider]  Cyanocobalamin (VITAMIN B 12 PO) Take 1 capsule by mouth daily.    [provider]  cyclobenzaprine (FLEXERIL) 10 MG tablet Take 10 mg by mouth 3 (three) times daily as needed for muscle spasms.    [provider]  Ferrous Sulfate (IRON) 325 (65 Fe) MG TABS Take 325 mg by mouth every other day.  05/12/20   [provider]  gabapentin (NEURONTIN) 300 MG capsule Take 300 mg by mouth daily.  03/29/20   [provider]  Ginkgo Biloba 40 MG TABS Take 40 mg by mouth daily.     [provider]  HYDROcodone-homatropine (HYCODAN) 5-1.5 MG/5ML syrup Take 5 mLs by mouth every 6 (six) hours as needed for cough. 03/10/21   Charlynne Pander, MD  ipratropium (ATROVENT) 0.06 % nasal spray USE TWO SPRAYS IN EACH NOSTRIL TWICE DAILY AS DIRECTED. Patient taking differently: Place 1 spray into both nostrils daily as needed for rhinitis.  05/22/20   Marcelyn Bruins, MD  LINZESS 290 MCG CAPS capsule Take 290 mcg by mouth daily as needed (abdominal cramping).  07/03/20   [provider]  mirtazapine (REMERON) 15 MG tablet Take 15 mg by mouth at bedtime. 02/08/20   [provider]  Multiple Vitamin (MULTIVITAMIN WITH MINERALS) TABS tablet Take 1 tablet by mouth daily.    [provider]  oxyCODONE (OXY IR/ROXICODONE) 5 MG immediate release tablet Take 1 tablet (5 mg total) by mouth every 6 (six) hours as needed for up to 10 doses for severe pain. 05/20/20   Lanae Boast, MD  pantoprazole (PROTONIX) 40 MG tablet Take 1 tablet (40 mg total) by mouth 2 (two) times daily. 05/20/20 10/17/20  Lanae Boast, MD  promethazine (PHENERGAN) 25 MG tablet Take 1 tablet (25 mg total) by mouth every 6 (six) hours as needed for up to 7 days for nausea  or vomiting. Patient taking differently: Take 25 mg by mouth every 6 (six) hours as needed for nausea or vomiting. Nausea 05/20/20 10/17/20  Lanae Boast, MD  rosuvastatin (CRESTOR) 5 MG tablet Take 5 mg by mouth once a week. Sundays 05/29/20   [provider]  sucralfate (CARAFATE) 1 g tablet Take 1 tablet (1 g total) by mouth 4 (four) times daily -  with meals and at bedtime. 10/17/20   Tilden Fossa, MD  tamsulosin (FLOMAX) 0.4 MG CAPS capsule Take 0.4 mg by mouth daily.    [provider]  traZODone (DESYREL) 50 MG tablet Take 50 mg by mouth at bedtime as  needed for sleep.  07/31/20   [provider]  Zinc 50 MG TABS Take 50 mg by mouth daily.     [provider]  azelastine (ASTELIN) 0.1 % nasal spray 2 sprays per nostril twice a day for control of nasal drainage. Patient not taking: Reported on 05/02/2020 11/27/19 05/02/20  Marcelyn Bruins, MD    Allergies    Contrast media [iodinated diagnostic agents], Other, Shellfish allergy, Reglan [metoclopramide], Iodine, Tetracyclines & related, and Zofran [ondansetron hcl]  Review of Systems   Review of Systems  Constitutional:  Positive for fatigue. Negative for chills and fever.  HENT: Negative.    Respiratory:  Positive for cough and shortness of breath.   Cardiovascular:  Positive for chest pain.  Gastrointestinal:  Positive for abdominal pain, diarrhea and vomiting. Negative for blood in stool and constipation.  Genitourinary:  Negative for dysuria and frequency.  Musculoskeletal:  Positive for myalgias.  Skin:  Negative for color change and rash.  Neurological:  Negative for tremors, light-headedness and headaches.  All other systems reviewed and are negative.  Physical Exam Updated Vital Signs BP (!) 149/96 (BP Location: Right Arm)   Pulse 85   Temp 98.6 F (37 C) (Oral)   Resp 16   Ht 5\' 8"  (1.727 m)   Wt 61.7 kg   SpO2 99%   BMI 20.68 kg/m   Physical Exam Vitals and nursing note  reviewed.  Constitutional:      General: He is not in acute distress.    Appearance: Normal appearance. He is well-developed. He is not diaphoretic.     Comments: Alert, well-appearing and in no distress  HENT:     Head: Normocephalic and atraumatic.  Eyes:     General:        Right eye: No discharge.        Left eye: No discharge.     Pupils: Pupils are equal, round, and reactive to light.  Cardiovascular:     Rate and Rhythm: Normal rate and regular rhythm.     Pulses: Normal pulses.     Heart sounds: Normal heart sounds.  Pulmonary:     Effort: Pulmonary effort is normal. No respiratory distress.     Breath sounds: Wheezing present. No rales.     Comments: Respirations equal and unlabored, patient able to speak in full sentences, satting 99% on room air. Som faint wheezing noted in bilateral lung fields. Abdominal:     General: Bowel sounds are normal. There is no distension.     Palpations: Abdomen is soft. There is no mass.     Tenderness: There is abdominal tenderness. There is no guarding.     Comments: Abdomen soft, nondistended, bowel sounds present throughout, there is generalized abdominal tenderness post notable in the LUQ, no guarding or peritoneal signs  Musculoskeletal:        General: No deformity.     Cervical back: Neck supple.  Skin:    General: Skin is warm and dry.     Capillary Refill: Capillary refill takes less than 2 seconds.  Neurological:     Mental Status: He is alert and oriented to person, place, and time.     Coordination: Coordination normal.     Comments: Speech is clear, able to follow commands Moves extremities without ataxia, coordination intact  Psychiatric:        Mood and Affect: Mood normal.        Behavior: Behavior normal.    ED Results /  Procedures / Treatments   Labs (all labs ordered are listed, but only abnormal results are displayed) Labs Reviewed  COMPREHENSIVE METABOLIC PANEL - Abnormal; Notable for the following  components:      Result Value   Glucose, Bld 131 (*)    Calcium 8.7 (*)    ALT 45 (*)    All other components within normal limits  URINALYSIS, ROUTINE W REFLEX MICROSCOPIC - Abnormal; Notable for the following components:   Color, Urine COLORLESS (*)    Ketones, ur 5 (*)    All other components within normal limits  LIPASE, BLOOD  CBC  D-DIMER, QUANTITATIVE  TROPONIN I (HIGH SENSITIVITY)  TROPONIN I (HIGH SENSITIVITY)    EKG EKG Interpretation  Date/Time:  Friday July 03 2021 19:05:08 EDT Ventricular Rate:  78 PR Interval:  157 QRS Duration: 87 QT Interval:  383 QTC Calculation: 437 R Axis:   -8 Text Interpretation: Sinus rhythm Low voltage, precordial leads Abnormal R-wave progression, early transition ST elevation, consider inferior injury Confirmed by Vanetta Mulders [phone]) on 07/03/2021 8:55:56 PM  Radiology CT ABDOMEN PELVIS WO CONTRAST  Result Date: 07/03/2021 CLINICAL DATA:  Acute nonlocalized abdominal pain. COVID positive. Diarrhea and vomiting. EXAM: CT ABDOMEN AND PELVIS WITHOUT CONTRAST TECHNIQUE: Multidetector CT imaging of the abdomen and pelvis was performed following the standard protocol without IV contrast. COMPARISON:  01/20/2021, 03/10/2021 FINDINGS: Lower chest: Postoperative changes and pleural thickening in the left lung base. Postoperative changes at the GE junction. Hepatobiliary: Surgical absence of the gallbladder. No focal liver lesions appreciated on noncontrast imaging. No bile duct dilatation. Pancreas: Unremarkable. No pancreatic ductal dilatation or surrounding inflammatory changes. Spleen: Normal in size without focal abnormality. Adrenals/Urinary Tract: Adrenal glands are unremarkable. Kidneys are normal, without renal calculi, focal lesion, or hydronephrosis. Bladder is unremarkable. Stomach/Bowel: Stomach, small bowel, and colon are not abnormally distended. No inflammatory changes are appreciated. Appendix is not identified.  Vascular/Lymphatic: Aortic atherosclerosis. No enlarged abdominal or pelvic lymph nodes. Reproductive: Prostate is unremarkable. Other: No free air or free fluid in the abdomen. Abdominal wall musculature appears intact. Musculoskeletal: Slight anterior subluxation at L4-5 is likely degenerative. No destructive bone lesions. IMPRESSION: No acute process demonstrated in the abdomen or pelvis. No evidence of bowel obstruction or inflammation. Postoperative changes at the EG junction. Electronically Signed   By: Burman Nieves M.D.   On: 07/03/2021 22:37   DG Chest Port 1 View  Result Date: 07/03/2021 CLINICAL DATA:  COVID-19 positive 2 days ago, short of breath, wheezing EXAM: PORTABLE CHEST 1 VIEW COMPARISON:  04/16/2021 FINDINGS: Single frontal view of the chest demonstrates a stable right chest wall port. The cardiac silhouette is unremarkable. There is chronic elevation of the left hemidiaphragm. No acute airspace disease, effusion, or pneumothorax. Stable postsurgical changes are seen within the left lower chest and upper abdomen. IMPRESSION: 1. Chronic elevation of the left hemidiaphragm.  No acute process. Electronically Signed   By: Sharlet Salina M.D.   On: 07/03/2021 19:12    Procedures Procedures   Medications Ordered in ED Medications  heparin lock flush 100 unit/mL (has no administration in time range)  sodium chloride 0.9 % bolus 1,000 mL (0 mLs Intravenous Stopped 07/03/21 2244)  famotidine (PEPCID) IVPB 20 mg premix (0 mg Intravenous Stopped 07/03/21 2244)  promethazine (PHENERGAN) 25 mg in sodium chloride 0.9 % 50 mL IVPB (0 mg Intravenous Stopped 07/03/21 2244)  albuterol (VENTOLIN HFA) 108 (90 Base) MCG/ACT inhaler 4 puff (4 puffs Inhalation Given 07/03/21 1908)  morphine  4 MG/ML injection 4 mg (4 mg Intravenous Given 07/03/21 1930)  morphine 4 MG/ML injection 4 mg (4 mg Intravenous Given 07/03/21 2104)  prochlorperazine (COMPAZINE) injection 10 mg (10 mg Intravenous Given 07/03/21  2104)    ED Course  I have reviewed the triage vital signs and the nursing notes.  Pertinent labs & imaging results that were available during my care of the patient were reviewed by me and considered in my medical decision making (see chart for details).    MDM Rules/Calculators/A&P                          55 year old male presents with 4 days of persistent nausea, vomiting and diarrhea associated with abdominal pain.  Tested positive for COVID 2 days ago with associated wheezing and shortness of breath, but initially thought this was just related to his asthma, occasional cough, no fevers.  Last night started having some intermittent chest pains that are sometimes pleuritic.  Patient has not been able to keep anything down and has had numerous episodes of diarrhea and now feeling somewhat weak.  On abdominal exam he has some generalized tenderness, primarily in the left upper quadrant.  Symptoms could certainly all be related to COVID infection.  We will start with abdominal labs as well as EKG, troponin, D-dimer, chest x-ray.  Will give patient fluids, IV pain and nausea medication and Pepcid to help with vomiting and discomfort.  I have independently ordered, reviewed and interpreted all labs and imaging: CBC: No leukocytosis, normal hemoglobin CMP: Glucose 131, very mild hypocalcemia, no other significant electrolyte derangements, normal renal and liver function Troponin: Negative x2 Lipase: WNL UA: No signs of infection D-dimer: WNL, very low suspicion for PE  CXR: Chronically elevated left hemidiaphragm, no pneumonia or other active cardiopulmonary disease  After pain medication and 2 rounds of antiemetics patient has had some improvement in his pain but is still focally tender in the left upper quadrant and has had persistent discomfort.  He has not had any further vomiting but still reports feeling nauseated.  No further diarrhea.  Department.  Given persistent pain that has been  difficult to control we will get CT abdomen pelvis to rule out other acute intra-abdominal pathology.  CT abdomen pelvis with no acute process demonstrated in the abdomen or pelvis.  Suspect all patient's symptoms are related to COVID infection.  He is still feeling a bit nauseated but has been able to tolerate liquids with no further vomiting.  Discussed with patient that he will likely still feel a bit nauseated and queasy until he gets something on his stomach encouraged him to start with clear liquids and slowly advance diet in small amounts.  He was prescribed Phenergan which she has at home I encouraged him to take this scheduled for the next 24 to 48 hours and then as needed for nausea and vomiting.  Bentyl prescribed for abdominal cramping and encouraged to continue taking his home Protonix and continue using his respiratory medications per his pulmonologist.  Discussed follow-up and strict return precautions.  Patient expresses understanding and agreement.  Discharged home in good condition.  Georgette DoverWilliam E Fleeger was evaluated in Emergency Department on 07/04/2021 for the symptoms described in the history of present illness. He was evaluated in the context of the global COVID-19 pandemic, which necessitated consideration that the patient might be at risk for infection with the SARS-CoV-2 virus that causes COVID-19. Institutional protocols and algorithms that pertain  to the evaluation of patients at risk for COVID-19 are in a state of rapid change based on information released by regulatory bodies including the CDC and federal and state organizations. These policies and algorithms were followed during the patient's care in the ED.  Final Clinical Impression(s) / ED Diagnoses Final diagnoses:  COVID-19 virus infection  Nausea vomiting and diarrhea  Generalized abdominal pain  Central chest pain    Rx / DC Orders ED Discharge Orders          Ordered    dicyclomine (BENTYL) 20 MG tablet  2 times  daily        07/03/21 2336             Dartha Lodge, PA-C 07/04/21 1108    Vanetta Mulders, MD 07/04/21 Ernestina Columbia

## 2021-07-04 DIAGNOSIS — U071 COVID-19: Secondary | ICD-10-CM | POA: Diagnosis not present

## 2021-07-06 DIAGNOSIS — M791 Myalgia, unspecified site: Secondary | ICD-10-CM | POA: Diagnosis not present

## 2021-07-06 DIAGNOSIS — U071 COVID-19: Secondary | ICD-10-CM | POA: Diagnosis not present

## 2021-07-06 DIAGNOSIS — R509 Fever, unspecified: Secondary | ICD-10-CM | POA: Diagnosis not present

## 2021-07-06 DIAGNOSIS — E876 Hypokalemia: Secondary | ICD-10-CM | POA: Diagnosis not present

## 2021-07-06 DIAGNOSIS — E875 Hyperkalemia: Secondary | ICD-10-CM | POA: Diagnosis not present

## 2021-07-06 DIAGNOSIS — R197 Diarrhea, unspecified: Secondary | ICD-10-CM | POA: Diagnosis not present

## 2021-07-06 DIAGNOSIS — J439 Emphysema, unspecified: Secondary | ICD-10-CM | POA: Diagnosis not present

## 2021-07-06 DIAGNOSIS — R519 Headache, unspecified: Secondary | ICD-10-CM | POA: Diagnosis not present

## 2021-07-13 DIAGNOSIS — R042 Hemoptysis: Secondary | ICD-10-CM | POA: Diagnosis not present

## 2021-07-13 DIAGNOSIS — M349 Systemic sclerosis, unspecified: Secondary | ICD-10-CM | POA: Diagnosis not present

## 2021-07-13 DIAGNOSIS — Z8616 Personal history of COVID-19: Secondary | ICD-10-CM | POA: Diagnosis not present

## 2021-07-13 DIAGNOSIS — J4550 Severe persistent asthma, uncomplicated: Secondary | ICD-10-CM | POA: Diagnosis not present

## 2021-07-13 DIAGNOSIS — R0600 Dyspnea, unspecified: Secondary | ICD-10-CM | POA: Diagnosis not present

## 2021-07-13 DIAGNOSIS — K219 Gastro-esophageal reflux disease without esophagitis: Secondary | ICD-10-CM | POA: Diagnosis not present

## 2021-07-15 DIAGNOSIS — M349 Systemic sclerosis, unspecified: Secondary | ICD-10-CM | POA: Diagnosis not present

## 2021-07-22 DIAGNOSIS — K3184 Gastroparesis: Secondary | ICD-10-CM | POA: Diagnosis not present

## 2021-07-22 DIAGNOSIS — K2270 Barrett's esophagus without dysplasia: Secondary | ICD-10-CM | POA: Diagnosis not present

## 2021-07-22 DIAGNOSIS — B3781 Candidal esophagitis: Secondary | ICD-10-CM | POA: Diagnosis not present

## 2021-07-22 DIAGNOSIS — B49 Unspecified mycosis: Secondary | ICD-10-CM | POA: Diagnosis not present

## 2021-07-22 DIAGNOSIS — R1310 Dysphagia, unspecified: Secondary | ICD-10-CM | POA: Diagnosis not present

## 2021-07-23 DIAGNOSIS — R042 Hemoptysis: Secondary | ICD-10-CM | POA: Diagnosis not present

## 2021-07-23 DIAGNOSIS — Z8616 Personal history of COVID-19: Secondary | ICD-10-CM | POA: Diagnosis not present

## 2021-07-23 DIAGNOSIS — I700 Atherosclerosis of aorta: Secondary | ICD-10-CM | POA: Diagnosis not present

## 2021-07-24 DIAGNOSIS — R1319 Other dysphagia: Secondary | ICD-10-CM | POA: Diagnosis not present

## 2021-07-24 DIAGNOSIS — K219 Gastro-esophageal reflux disease without esophagitis: Secondary | ICD-10-CM | POA: Diagnosis not present

## 2021-07-24 DIAGNOSIS — R0600 Dyspnea, unspecified: Secondary | ICD-10-CM | POA: Diagnosis not present

## 2021-07-24 DIAGNOSIS — Z8719 Personal history of other diseases of the digestive system: Secondary | ICD-10-CM | POA: Diagnosis not present

## 2021-07-24 DIAGNOSIS — Z8616 Personal history of COVID-19: Secondary | ICD-10-CM | POA: Diagnosis not present

## 2021-07-24 DIAGNOSIS — R042 Hemoptysis: Secondary | ICD-10-CM | POA: Diagnosis not present

## 2021-07-24 DIAGNOSIS — M349 Systemic sclerosis, unspecified: Secondary | ICD-10-CM | POA: Diagnosis not present

## 2021-07-24 DIAGNOSIS — J4550 Severe persistent asthma, uncomplicated: Secondary | ICD-10-CM | POA: Diagnosis not present

## 2021-07-29 DIAGNOSIS — J45909 Unspecified asthma, uncomplicated: Secondary | ICD-10-CM | POA: Diagnosis not present

## 2021-07-31 DIAGNOSIS — J45909 Unspecified asthma, uncomplicated: Secondary | ICD-10-CM | POA: Diagnosis not present

## 2021-07-31 DIAGNOSIS — Z789 Other specified health status: Secondary | ICD-10-CM | POA: Diagnosis not present

## 2021-07-31 DIAGNOSIS — I2510 Atherosclerotic heart disease of native coronary artery without angina pectoris: Secondary | ICD-10-CM | POA: Diagnosis not present

## 2021-07-31 DIAGNOSIS — Z7982 Long term (current) use of aspirin: Secondary | ICD-10-CM | POA: Diagnosis not present

## 2021-07-31 DIAGNOSIS — E782 Mixed hyperlipidemia: Secondary | ICD-10-CM | POA: Diagnosis not present

## 2021-07-31 DIAGNOSIS — R079 Chest pain, unspecified: Secondary | ICD-10-CM | POA: Diagnosis not present

## 2021-08-03 DIAGNOSIS — Z452 Encounter for adjustment and management of vascular access device: Secondary | ICD-10-CM | POA: Diagnosis not present

## 2021-08-03 DIAGNOSIS — J45909 Unspecified asthma, uncomplicated: Secondary | ICD-10-CM | POA: Diagnosis not present

## 2021-08-03 DIAGNOSIS — Z789 Other specified health status: Secondary | ICD-10-CM | POA: Diagnosis not present

## 2021-08-04 DIAGNOSIS — K2270 Barrett's esophagus without dysplasia: Secondary | ICD-10-CM | POA: Diagnosis not present

## 2021-08-04 DIAGNOSIS — J45909 Unspecified asthma, uncomplicated: Secondary | ICD-10-CM | POA: Diagnosis not present

## 2021-08-04 DIAGNOSIS — K219 Gastro-esophageal reflux disease without esophagitis: Secondary | ICD-10-CM | POA: Diagnosis not present

## 2021-08-04 DIAGNOSIS — E785 Hyperlipidemia, unspecified: Secondary | ICD-10-CM | POA: Diagnosis not present

## 2021-08-04 DIAGNOSIS — Z9889 Other specified postprocedural states: Secondary | ICD-10-CM | POA: Diagnosis not present

## 2021-08-04 DIAGNOSIS — R1310 Dysphagia, unspecified: Secondary | ICD-10-CM | POA: Diagnosis not present

## 2021-08-04 DIAGNOSIS — K2289 Other specified disease of esophagus: Secondary | ICD-10-CM | POA: Diagnosis not present

## 2021-08-05 DIAGNOSIS — M349 Systemic sclerosis, unspecified: Secondary | ICD-10-CM | POA: Diagnosis not present

## 2021-08-05 DIAGNOSIS — Z8616 Personal history of COVID-19: Secondary | ICD-10-CM | POA: Diagnosis not present

## 2021-08-05 DIAGNOSIS — R0600 Dyspnea, unspecified: Secondary | ICD-10-CM | POA: Diagnosis not present

## 2021-08-05 DIAGNOSIS — K219 Gastro-esophageal reflux disease without esophagitis: Secondary | ICD-10-CM | POA: Diagnosis not present

## 2021-08-05 DIAGNOSIS — J4550 Severe persistent asthma, uncomplicated: Secondary | ICD-10-CM | POA: Diagnosis not present

## 2021-08-05 DIAGNOSIS — R042 Hemoptysis: Secondary | ICD-10-CM | POA: Diagnosis not present

## 2021-08-07 DIAGNOSIS — J45909 Unspecified asthma, uncomplicated: Secondary | ICD-10-CM | POA: Diagnosis not present

## 2021-08-10 DIAGNOSIS — J45909 Unspecified asthma, uncomplicated: Secondary | ICD-10-CM | POA: Diagnosis not present

## 2021-08-10 DIAGNOSIS — K219 Gastro-esophageal reflux disease without esophagitis: Secondary | ICD-10-CM | POA: Diagnosis not present

## 2021-08-10 DIAGNOSIS — K9189 Other postprocedural complications and disorders of digestive system: Secondary | ICD-10-CM | POA: Diagnosis not present

## 2021-08-12 DIAGNOSIS — M25562 Pain in left knee: Secondary | ICD-10-CM | POA: Diagnosis not present

## 2021-08-12 DIAGNOSIS — J45909 Unspecified asthma, uncomplicated: Secondary | ICD-10-CM | POA: Diagnosis not present

## 2021-08-12 DIAGNOSIS — M79642 Pain in left hand: Secondary | ICD-10-CM | POA: Diagnosis not present

## 2021-08-12 DIAGNOSIS — M791 Myalgia, unspecified site: Secondary | ICD-10-CM | POA: Diagnosis not present

## 2021-08-12 DIAGNOSIS — M359 Systemic involvement of connective tissue, unspecified: Secondary | ICD-10-CM | POA: Diagnosis not present

## 2021-08-12 DIAGNOSIS — M2550 Pain in unspecified joint: Secondary | ICD-10-CM | POA: Diagnosis not present

## 2021-08-12 DIAGNOSIS — M79641 Pain in right hand: Secondary | ICD-10-CM | POA: Diagnosis not present

## 2021-08-12 DIAGNOSIS — U099 Post covid-19 condition, unspecified: Secondary | ICD-10-CM | POA: Diagnosis not present

## 2021-08-12 DIAGNOSIS — R5383 Other fatigue: Secondary | ICD-10-CM | POA: Diagnosis not present

## 2021-08-12 DIAGNOSIS — M25561 Pain in right knee: Secondary | ICD-10-CM | POA: Diagnosis not present

## 2021-08-12 DIAGNOSIS — M1990 Unspecified osteoarthritis, unspecified site: Secondary | ICD-10-CM | POA: Diagnosis not present

## 2021-08-14 DIAGNOSIS — K224 Dyskinesia of esophagus: Secondary | ICD-10-CM | POA: Diagnosis not present

## 2021-08-14 DIAGNOSIS — K219 Gastro-esophageal reflux disease without esophagitis: Secondary | ICD-10-CM | POA: Diagnosis not present

## 2021-08-17 DIAGNOSIS — J449 Chronic obstructive pulmonary disease, unspecified: Secondary | ICD-10-CM | POA: Diagnosis not present

## 2021-08-17 DIAGNOSIS — J45909 Unspecified asthma, uncomplicated: Secondary | ICD-10-CM | POA: Diagnosis not present

## 2021-08-20 DIAGNOSIS — K219 Gastro-esophageal reflux disease without esophagitis: Secondary | ICD-10-CM | POA: Diagnosis not present

## 2021-08-21 DIAGNOSIS — J449 Chronic obstructive pulmonary disease, unspecified: Secondary | ICD-10-CM | POA: Diagnosis not present

## 2021-08-21 DIAGNOSIS — J45909 Unspecified asthma, uncomplicated: Secondary | ICD-10-CM | POA: Diagnosis not present

## 2021-08-24 DIAGNOSIS — J449 Chronic obstructive pulmonary disease, unspecified: Secondary | ICD-10-CM | POA: Diagnosis not present

## 2021-08-24 DIAGNOSIS — J45909 Unspecified asthma, uncomplicated: Secondary | ICD-10-CM | POA: Diagnosis not present

## 2021-08-28 DIAGNOSIS — J45909 Unspecified asthma, uncomplicated: Secondary | ICD-10-CM | POA: Diagnosis not present

## 2021-08-28 DIAGNOSIS — J449 Chronic obstructive pulmonary disease, unspecified: Secondary | ICD-10-CM | POA: Diagnosis not present

## 2021-08-31 DIAGNOSIS — J45909 Unspecified asthma, uncomplicated: Secondary | ICD-10-CM | POA: Diagnosis not present

## 2021-08-31 DIAGNOSIS — J449 Chronic obstructive pulmonary disease, unspecified: Secondary | ICD-10-CM | POA: Diagnosis not present

## 2021-09-02 DIAGNOSIS — J45909 Unspecified asthma, uncomplicated: Secondary | ICD-10-CM | POA: Diagnosis not present

## 2021-09-02 DIAGNOSIS — J449 Chronic obstructive pulmonary disease, unspecified: Secondary | ICD-10-CM | POA: Diagnosis not present

## 2021-09-04 DIAGNOSIS — J45909 Unspecified asthma, uncomplicated: Secondary | ICD-10-CM | POA: Diagnosis not present

## 2021-09-04 DIAGNOSIS — J449 Chronic obstructive pulmonary disease, unspecified: Secondary | ICD-10-CM | POA: Diagnosis not present

## 2021-09-07 DIAGNOSIS — R918 Other nonspecific abnormal finding of lung field: Secondary | ICD-10-CM | POA: Diagnosis not present

## 2021-09-07 DIAGNOSIS — Z9889 Other specified postprocedural states: Secondary | ICD-10-CM | POA: Diagnosis not present

## 2021-09-07 DIAGNOSIS — J45909 Unspecified asthma, uncomplicated: Secondary | ICD-10-CM | POA: Diagnosis not present

## 2021-09-07 DIAGNOSIS — Z8616 Personal history of COVID-19: Secondary | ICD-10-CM | POA: Diagnosis not present

## 2021-09-07 DIAGNOSIS — R042 Hemoptysis: Secondary | ICD-10-CM | POA: Diagnosis not present

## 2021-09-09 DIAGNOSIS — Z79899 Other long term (current) drug therapy: Secondary | ICD-10-CM | POA: Diagnosis not present

## 2021-09-09 DIAGNOSIS — R1110 Vomiting, unspecified: Secondary | ICD-10-CM | POA: Diagnosis not present

## 2021-09-09 DIAGNOSIS — R0989 Other specified symptoms and signs involving the circulatory and respiratory systems: Secondary | ICD-10-CM | POA: Diagnosis not present

## 2021-09-09 DIAGNOSIS — K219 Gastro-esophageal reflux disease without esophagitis: Secondary | ICD-10-CM | POA: Diagnosis not present

## 2021-09-09 DIAGNOSIS — R110 Nausea: Secondary | ICD-10-CM | POA: Diagnosis not present

## 2021-09-09 DIAGNOSIS — K2270 Barrett's esophagus without dysplasia: Secondary | ICD-10-CM | POA: Diagnosis not present

## 2021-09-09 DIAGNOSIS — R142 Eructation: Secondary | ICD-10-CM | POA: Diagnosis not present

## 2021-09-09 DIAGNOSIS — Z7982 Long term (current) use of aspirin: Secondary | ICD-10-CM | POA: Diagnosis not present

## 2021-09-11 DIAGNOSIS — K219 Gastro-esophageal reflux disease without esophagitis: Secondary | ICD-10-CM | POA: Diagnosis not present

## 2021-09-11 DIAGNOSIS — Z03818 Encounter for observation for suspected exposure to other biological agents ruled out: Secondary | ICD-10-CM | POA: Diagnosis not present

## 2021-09-11 DIAGNOSIS — R0600 Dyspnea, unspecified: Secondary | ICD-10-CM | POA: Diagnosis not present

## 2021-09-11 DIAGNOSIS — R042 Hemoptysis: Secondary | ICD-10-CM | POA: Diagnosis not present

## 2021-09-11 DIAGNOSIS — M349 Systemic sclerosis, unspecified: Secondary | ICD-10-CM | POA: Diagnosis not present

## 2021-09-11 DIAGNOSIS — Z8616 Personal history of COVID-19: Secondary | ICD-10-CM | POA: Diagnosis not present

## 2021-09-11 DIAGNOSIS — R509 Fever, unspecified: Secondary | ICD-10-CM | POA: Diagnosis not present

## 2021-09-11 DIAGNOSIS — J4551 Severe persistent asthma with (acute) exacerbation: Secondary | ICD-10-CM | POA: Diagnosis not present

## 2021-09-16 DIAGNOSIS — M349 Systemic sclerosis, unspecified: Secondary | ICD-10-CM | POA: Diagnosis not present

## 2021-09-16 DIAGNOSIS — R042 Hemoptysis: Secondary | ICD-10-CM | POA: Diagnosis not present

## 2021-09-16 DIAGNOSIS — Z8616 Personal history of COVID-19: Secondary | ICD-10-CM | POA: Diagnosis not present

## 2021-09-16 DIAGNOSIS — R051 Acute cough: Secondary | ICD-10-CM | POA: Diagnosis not present

## 2021-09-16 DIAGNOSIS — R062 Wheezing: Secondary | ICD-10-CM | POA: Diagnosis not present

## 2021-09-16 DIAGNOSIS — K219 Gastro-esophageal reflux disease without esophagitis: Secondary | ICD-10-CM | POA: Diagnosis not present

## 2021-09-16 DIAGNOSIS — B974 Respiratory syncytial virus as the cause of diseases classified elsewhere: Secondary | ICD-10-CM | POA: Diagnosis not present

## 2021-09-16 DIAGNOSIS — Z03818 Encounter for observation for suspected exposure to other biological agents ruled out: Secondary | ICD-10-CM | POA: Diagnosis not present

## 2021-09-16 DIAGNOSIS — J4551 Severe persistent asthma with (acute) exacerbation: Secondary | ICD-10-CM | POA: Diagnosis not present

## 2021-09-16 DIAGNOSIS — R509 Fever, unspecified: Secondary | ICD-10-CM | POA: Diagnosis not present

## 2021-09-16 DIAGNOSIS — J019 Acute sinusitis, unspecified: Secondary | ICD-10-CM | POA: Diagnosis not present

## 2021-09-16 DIAGNOSIS — R0600 Dyspnea, unspecified: Secondary | ICD-10-CM | POA: Diagnosis not present

## 2021-09-17 DIAGNOSIS — K449 Diaphragmatic hernia without obstruction or gangrene: Secondary | ICD-10-CM | POA: Diagnosis not present

## 2021-09-17 DIAGNOSIS — R0602 Shortness of breath: Secondary | ICD-10-CM | POA: Diagnosis not present

## 2021-09-17 DIAGNOSIS — I2699 Other pulmonary embolism without acute cor pulmonale: Secondary | ICD-10-CM | POA: Diagnosis not present

## 2021-09-21 DIAGNOSIS — J45909 Unspecified asthma, uncomplicated: Secondary | ICD-10-CM | POA: Diagnosis not present

## 2021-09-22 DIAGNOSIS — J4550 Severe persistent asthma, uncomplicated: Secondary | ICD-10-CM | POA: Diagnosis not present

## 2021-09-22 DIAGNOSIS — K219 Gastro-esophageal reflux disease without esophagitis: Secondary | ICD-10-CM | POA: Diagnosis not present

## 2021-09-22 DIAGNOSIS — M359 Systemic involvement of connective tissue, unspecified: Secondary | ICD-10-CM | POA: Diagnosis not present

## 2021-09-22 DIAGNOSIS — U099 Post covid-19 condition, unspecified: Secondary | ICD-10-CM | POA: Diagnosis not present

## 2021-09-22 DIAGNOSIS — M791 Myalgia, unspecified site: Secondary | ICD-10-CM | POA: Diagnosis not present

## 2021-09-22 DIAGNOSIS — R0600 Dyspnea, unspecified: Secondary | ICD-10-CM | POA: Diagnosis not present

## 2021-09-22 DIAGNOSIS — R5383 Other fatigue: Secondary | ICD-10-CM | POA: Diagnosis not present

## 2021-09-22 DIAGNOSIS — M1990 Unspecified osteoarthritis, unspecified site: Secondary | ICD-10-CM | POA: Diagnosis not present

## 2021-09-22 DIAGNOSIS — R042 Hemoptysis: Secondary | ICD-10-CM | POA: Diagnosis not present

## 2021-09-22 DIAGNOSIS — Z8616 Personal history of COVID-19: Secondary | ICD-10-CM | POA: Diagnosis not present

## 2021-09-22 DIAGNOSIS — M2550 Pain in unspecified joint: Secondary | ICD-10-CM | POA: Diagnosis not present

## 2021-09-22 DIAGNOSIS — G629 Polyneuropathy, unspecified: Secondary | ICD-10-CM | POA: Diagnosis not present

## 2021-09-23 DIAGNOSIS — Z23 Encounter for immunization: Secondary | ICD-10-CM | POA: Diagnosis not present

## 2021-09-23 DIAGNOSIS — K5900 Constipation, unspecified: Secondary | ICD-10-CM | POA: Diagnosis not present

## 2021-09-23 DIAGNOSIS — J45909 Unspecified asthma, uncomplicated: Secondary | ICD-10-CM | POA: Diagnosis not present

## 2021-09-25 DIAGNOSIS — J45909 Unspecified asthma, uncomplicated: Secondary | ICD-10-CM | POA: Diagnosis not present

## 2021-09-28 DIAGNOSIS — J45909 Unspecified asthma, uncomplicated: Secondary | ICD-10-CM | POA: Diagnosis not present

## 2021-10-02 DIAGNOSIS — J45909 Unspecified asthma, uncomplicated: Secondary | ICD-10-CM | POA: Diagnosis not present

## 2021-10-05 DIAGNOSIS — K9189 Other postprocedural complications and disorders of digestive system: Secondary | ICD-10-CM | POA: Diagnosis not present

## 2021-10-05 DIAGNOSIS — J45909 Unspecified asthma, uncomplicated: Secondary | ICD-10-CM | POA: Diagnosis not present

## 2021-10-05 DIAGNOSIS — Z6821 Body mass index (BMI) 21.0-21.9, adult: Secondary | ICD-10-CM | POA: Diagnosis not present

## 2021-10-07 DIAGNOSIS — Z452 Encounter for adjustment and management of vascular access device: Secondary | ICD-10-CM | POA: Diagnosis not present

## 2021-10-13 DIAGNOSIS — Z6821 Body mass index (BMI) 21.0-21.9, adult: Secondary | ICD-10-CM | POA: Diagnosis not present

## 2021-10-13 DIAGNOSIS — E7800 Pure hypercholesterolemia, unspecified: Secondary | ICD-10-CM | POA: Diagnosis not present

## 2021-10-13 DIAGNOSIS — Z8616 Personal history of COVID-19: Secondary | ICD-10-CM | POA: Diagnosis not present

## 2021-10-13 DIAGNOSIS — Z01818 Encounter for other preprocedural examination: Secondary | ICD-10-CM | POA: Diagnosis not present

## 2021-10-13 DIAGNOSIS — R918 Other nonspecific abnormal finding of lung field: Secondary | ICD-10-CM | POA: Diagnosis not present

## 2021-10-13 DIAGNOSIS — Z79899 Other long term (current) drug therapy: Secondary | ICD-10-CM | POA: Diagnosis not present

## 2021-10-13 DIAGNOSIS — K9189 Other postprocedural complications and disorders of digestive system: Secondary | ICD-10-CM | POA: Diagnosis not present

## 2021-10-13 DIAGNOSIS — R1310 Dysphagia, unspecified: Secondary | ICD-10-CM | POA: Diagnosis not present

## 2021-10-19 DIAGNOSIS — J45909 Unspecified asthma, uncomplicated: Secondary | ICD-10-CM | POA: Diagnosis not present

## 2021-10-20 DIAGNOSIS — R042 Hemoptysis: Secondary | ICD-10-CM | POA: Diagnosis not present

## 2021-10-20 DIAGNOSIS — Z8616 Personal history of COVID-19: Secondary | ICD-10-CM | POA: Diagnosis not present

## 2021-10-20 DIAGNOSIS — J4550 Severe persistent asthma, uncomplicated: Secondary | ICD-10-CM | POA: Diagnosis not present

## 2021-10-20 DIAGNOSIS — K219 Gastro-esophageal reflux disease without esophagitis: Secondary | ICD-10-CM | POA: Diagnosis not present

## 2021-10-20 DIAGNOSIS — R0600 Dyspnea, unspecified: Secondary | ICD-10-CM | POA: Diagnosis not present

## 2021-10-21 DIAGNOSIS — J45909 Unspecified asthma, uncomplicated: Secondary | ICD-10-CM | POA: Diagnosis not present

## 2021-10-26 DIAGNOSIS — Z452 Encounter for adjustment and management of vascular access device: Secondary | ICD-10-CM | POA: Diagnosis not present

## 2021-10-26 DIAGNOSIS — J45909 Unspecified asthma, uncomplicated: Secondary | ICD-10-CM | POA: Diagnosis not present

## 2021-10-30 DIAGNOSIS — Z6820 Body mass index (BMI) 20.0-20.9, adult: Secondary | ICD-10-CM | POA: Diagnosis not present

## 2021-10-30 DIAGNOSIS — K5904 Chronic idiopathic constipation: Secondary | ICD-10-CM | POA: Diagnosis not present

## 2021-10-30 DIAGNOSIS — R0600 Dyspnea, unspecified: Secondary | ICD-10-CM | POA: Diagnosis not present

## 2021-10-30 DIAGNOSIS — R1310 Dysphagia, unspecified: Secondary | ICD-10-CM | POA: Diagnosis not present

## 2021-10-30 DIAGNOSIS — Z8616 Personal history of COVID-19: Secondary | ICD-10-CM | POA: Diagnosis not present

## 2021-10-30 DIAGNOSIS — J4550 Severe persistent asthma, uncomplicated: Secondary | ICD-10-CM | POA: Diagnosis not present

## 2021-10-30 DIAGNOSIS — Z03818 Encounter for observation for suspected exposure to other biological agents ruled out: Secondary | ICD-10-CM | POA: Diagnosis not present

## 2021-10-30 DIAGNOSIS — R042 Hemoptysis: Secondary | ICD-10-CM | POA: Diagnosis not present

## 2021-10-30 DIAGNOSIS — Z20822 Contact with and (suspected) exposure to covid-19: Secondary | ICD-10-CM | POA: Diagnosis not present

## 2021-10-30 DIAGNOSIS — K9189 Other postprocedural complications and disorders of digestive system: Secondary | ICD-10-CM | POA: Diagnosis not present

## 2021-10-30 DIAGNOSIS — K219 Gastro-esophageal reflux disease without esophagitis: Secondary | ICD-10-CM | POA: Diagnosis not present

## 2021-11-06 DIAGNOSIS — J4550 Severe persistent asthma, uncomplicated: Secondary | ICD-10-CM | POA: Diagnosis not present

## 2021-11-06 DIAGNOSIS — R042 Hemoptysis: Secondary | ICD-10-CM | POA: Diagnosis not present

## 2021-11-06 DIAGNOSIS — Z8616 Personal history of COVID-19: Secondary | ICD-10-CM | POA: Diagnosis not present

## 2021-11-06 DIAGNOSIS — R0600 Dyspnea, unspecified: Secondary | ICD-10-CM | POA: Diagnosis not present

## 2021-11-06 DIAGNOSIS — K219 Gastro-esophageal reflux disease without esophagitis: Secondary | ICD-10-CM | POA: Diagnosis not present

## 2021-11-17 DIAGNOSIS — Z20822 Contact with and (suspected) exposure to covid-19: Secondary | ICD-10-CM | POA: Diagnosis not present

## 2021-11-20 DIAGNOSIS — K567 Ileus, unspecified: Secondary | ICD-10-CM | POA: Diagnosis not present

## 2021-11-20 DIAGNOSIS — B965 Pseudomonas (aeruginosa) (mallei) (pseudomallei) as the cause of diseases classified elsewhere: Secondary | ICD-10-CM | POA: Diagnosis not present

## 2021-11-20 DIAGNOSIS — G7281 Critical illness myopathy: Secondary | ICD-10-CM | POA: Diagnosis not present

## 2021-11-20 DIAGNOSIS — R188 Other ascites: Secondary | ICD-10-CM | POA: Diagnosis not present

## 2021-11-20 DIAGNOSIS — T8119XA Other postprocedural shock, initial encounter: Secondary | ICD-10-CM | POA: Diagnosis not present

## 2021-11-20 DIAGNOSIS — R1313 Dysphagia, pharyngeal phase: Secondary | ICD-10-CM | POA: Diagnosis not present

## 2021-11-20 DIAGNOSIS — E7800 Pure hypercholesterolemia, unspecified: Secondary | ICD-10-CM | POA: Diagnosis not present

## 2021-11-20 DIAGNOSIS — T182XXA Foreign body in stomach, initial encounter: Secondary | ICD-10-CM | POA: Diagnosis not present

## 2021-11-20 DIAGNOSIS — B379 Candidiasis, unspecified: Secondary | ICD-10-CM | POA: Diagnosis not present

## 2021-11-20 DIAGNOSIS — I2510 Atherosclerotic heart disease of native coronary artery without angina pectoris: Secondary | ICD-10-CM | POA: Diagnosis not present

## 2021-11-20 DIAGNOSIS — R410 Disorientation, unspecified: Secondary | ICD-10-CM | POA: Diagnosis not present

## 2021-11-20 DIAGNOSIS — E8770 Fluid overload, unspecified: Secondary | ICD-10-CM | POA: Diagnosis not present

## 2021-11-20 DIAGNOSIS — J9691 Respiratory failure, unspecified with hypoxia: Secondary | ICD-10-CM | POA: Diagnosis not present

## 2021-11-20 DIAGNOSIS — Z7982 Long term (current) use of aspirin: Secondary | ICD-10-CM | POA: Diagnosis not present

## 2021-11-20 DIAGNOSIS — K529 Noninfective gastroenteritis and colitis, unspecified: Secondary | ICD-10-CM | POA: Diagnosis not present

## 2021-11-20 DIAGNOSIS — J9811 Atelectasis: Secondary | ICD-10-CM | POA: Diagnosis not present

## 2021-11-20 DIAGNOSIS — J811 Chronic pulmonary edema: Secondary | ICD-10-CM | POA: Diagnosis not present

## 2021-11-20 DIAGNOSIS — R140 Abdominal distension (gaseous): Secondary | ICD-10-CM | POA: Diagnosis not present

## 2021-11-20 DIAGNOSIS — R52 Pain, unspecified: Secondary | ICD-10-CM | POA: Diagnosis not present

## 2021-11-20 DIAGNOSIS — J939 Pneumothorax, unspecified: Secondary | ICD-10-CM | POA: Diagnosis not present

## 2021-11-20 DIAGNOSIS — K6389 Other specified diseases of intestine: Secondary | ICD-10-CM | POA: Diagnosis not present

## 2021-11-20 DIAGNOSIS — J948 Other specified pleural conditions: Secondary | ICD-10-CM | POA: Diagnosis not present

## 2021-11-20 DIAGNOSIS — D72829 Elevated white blood cell count, unspecified: Secondary | ICD-10-CM | POA: Diagnosis not present

## 2021-11-20 DIAGNOSIS — K2100 Gastro-esophageal reflux disease with esophagitis, without bleeding: Secondary | ICD-10-CM | POA: Diagnosis not present

## 2021-11-20 DIAGNOSIS — I10 Essential (primary) hypertension: Secondary | ICD-10-CM | POA: Diagnosis not present

## 2021-11-20 DIAGNOSIS — Z6827 Body mass index (BMI) 27.0-27.9, adult: Secondary | ICD-10-CM | POA: Diagnosis not present

## 2021-11-20 DIAGNOSIS — Z934 Other artificial openings of gastrointestinal tract status: Secondary | ICD-10-CM | POA: Diagnosis not present

## 2021-11-20 DIAGNOSIS — K651 Peritoneal abscess: Secondary | ICD-10-CM | POA: Diagnosis not present

## 2021-11-20 DIAGNOSIS — Z7901 Long term (current) use of anticoagulants: Secondary | ICD-10-CM | POA: Diagnosis not present

## 2021-11-20 DIAGNOSIS — M7989 Other specified soft tissue disorders: Secondary | ICD-10-CM | POA: Diagnosis not present

## 2021-11-20 DIAGNOSIS — J690 Pneumonitis due to inhalation of food and vomit: Secondary | ICD-10-CM | POA: Diagnosis not present

## 2021-11-20 DIAGNOSIS — J151 Pneumonia due to Pseudomonas: Secondary | ICD-10-CM | POA: Diagnosis not present

## 2021-11-20 DIAGNOSIS — J45909 Unspecified asthma, uncomplicated: Secondary | ICD-10-CM | POA: Diagnosis not present

## 2021-11-20 DIAGNOSIS — Z4682 Encounter for fitting and adjustment of non-vascular catheter: Secondary | ICD-10-CM | POA: Diagnosis not present

## 2021-11-20 DIAGNOSIS — R109 Unspecified abdominal pain: Secondary | ICD-10-CM | POA: Diagnosis not present

## 2021-11-20 DIAGNOSIS — K449 Diaphragmatic hernia without obstruction or gangrene: Secondary | ICD-10-CM | POA: Diagnosis not present

## 2021-11-20 DIAGNOSIS — T8143XS Infection following a procedure, organ and space surgical site, sequela: Secondary | ICD-10-CM | POA: Diagnosis not present

## 2021-11-20 DIAGNOSIS — J189 Pneumonia, unspecified organism: Secondary | ICD-10-CM | POA: Diagnosis not present

## 2021-11-20 DIAGNOSIS — D62 Acute posthemorrhagic anemia: Secondary | ICD-10-CM | POA: Diagnosis not present

## 2021-11-20 DIAGNOSIS — J90 Pleural effusion, not elsewhere classified: Secondary | ICD-10-CM | POA: Diagnosis not present

## 2021-11-20 DIAGNOSIS — Z9049 Acquired absence of other specified parts of digestive tract: Secondary | ICD-10-CM | POA: Diagnosis not present

## 2021-11-20 DIAGNOSIS — F05 Delirium due to known physiological condition: Secondary | ICD-10-CM | POA: Diagnosis not present

## 2021-11-20 DIAGNOSIS — L03114 Cellulitis of left upper limb: Secondary | ICD-10-CM | POA: Diagnosis not present

## 2021-11-20 DIAGNOSIS — G8918 Other acute postprocedural pain: Secondary | ICD-10-CM | POA: Diagnosis not present

## 2021-11-20 DIAGNOSIS — A4152 Sepsis due to Pseudomonas: Secondary | ICD-10-CM | POA: Diagnosis not present

## 2021-11-20 DIAGNOSIS — R0603 Acute respiratory distress: Secondary | ICD-10-CM | POA: Diagnosis not present

## 2021-11-20 DIAGNOSIS — R110 Nausea: Secondary | ICD-10-CM | POA: Diagnosis not present

## 2021-11-20 DIAGNOSIS — Z930 Tracheostomy status: Secondary | ICD-10-CM | POA: Diagnosis not present

## 2021-11-20 DIAGNOSIS — Z9889 Other specified postprocedural states: Secondary | ICD-10-CM | POA: Diagnosis not present

## 2021-11-20 DIAGNOSIS — R451 Restlessness and agitation: Secondary | ICD-10-CM | POA: Diagnosis not present

## 2021-11-20 DIAGNOSIS — I959 Hypotension, unspecified: Secondary | ICD-10-CM | POA: Diagnosis not present

## 2021-11-20 DIAGNOSIS — Z7409 Other reduced mobility: Secondary | ICD-10-CM | POA: Diagnosis not present

## 2021-11-20 DIAGNOSIS — Z743 Need for continuous supervision: Secondary | ICD-10-CM | POA: Diagnosis not present

## 2021-11-20 DIAGNOSIS — K219 Gastro-esophageal reflux disease without esophagitis: Secondary | ICD-10-CM | POA: Diagnosis not present

## 2021-11-20 DIAGNOSIS — J9601 Acute respiratory failure with hypoxia: Secondary | ICD-10-CM | POA: Diagnosis not present

## 2021-11-20 DIAGNOSIS — Z9911 Dependence on respirator [ventilator] status: Secondary | ICD-10-CM | POA: Diagnosis not present

## 2021-11-20 DIAGNOSIS — R0689 Other abnormalities of breathing: Secondary | ICD-10-CM | POA: Diagnosis not present

## 2021-11-20 DIAGNOSIS — T8143XA Infection following a procedure, organ and space surgical site, initial encounter: Secondary | ICD-10-CM | POA: Diagnosis not present

## 2021-11-20 DIAGNOSIS — Z8719 Personal history of other diseases of the digestive system: Secondary | ICD-10-CM | POA: Diagnosis not present

## 2021-11-20 DIAGNOSIS — T8112XA Postprocedural septic shock, initial encounter: Secondary | ICD-10-CM | POA: Diagnosis not present

## 2021-11-20 DIAGNOSIS — T17900A Unspecified foreign body in respiratory tract, part unspecified causing asphyxiation, initial encounter: Secondary | ICD-10-CM | POA: Diagnosis not present

## 2021-11-20 DIAGNOSIS — E43 Unspecified severe protein-calorie malnutrition: Secondary | ICD-10-CM | POA: Diagnosis not present

## 2021-11-20 DIAGNOSIS — R509 Fever, unspecified: Secondary | ICD-10-CM | POA: Diagnosis not present

## 2021-11-20 DIAGNOSIS — R1310 Dysphagia, unspecified: Secondary | ICD-10-CM | POA: Diagnosis not present

## 2021-11-20 DIAGNOSIS — R269 Unspecified abnormalities of gait and mobility: Secondary | ICD-10-CM | POA: Diagnosis not present

## 2021-11-20 DIAGNOSIS — K839 Disease of biliary tract, unspecified: Secondary | ICD-10-CM | POA: Diagnosis not present

## 2021-11-20 DIAGNOSIS — K56609 Unspecified intestinal obstruction, unspecified as to partial versus complete obstruction: Secondary | ICD-10-CM | POA: Diagnosis not present

## 2021-11-20 DIAGNOSIS — M6250 Muscle wasting and atrophy, not elsewhere classified, unspecified site: Secondary | ICD-10-CM | POA: Diagnosis not present

## 2021-11-20 DIAGNOSIS — Z4659 Encounter for fitting and adjustment of other gastrointestinal appliance and device: Secondary | ICD-10-CM | POA: Diagnosis not present

## 2021-11-20 DIAGNOSIS — J80 Acute respiratory distress syndrome: Secondary | ICD-10-CM | POA: Diagnosis not present

## 2021-11-20 DIAGNOSIS — R578 Other shock: Secondary | ICD-10-CM | POA: Diagnosis not present

## 2021-11-20 DIAGNOSIS — R0902 Hypoxemia: Secondary | ICD-10-CM | POA: Diagnosis not present

## 2021-11-20 DIAGNOSIS — K3189 Other diseases of stomach and duodenum: Secondary | ICD-10-CM | POA: Diagnosis not present

## 2021-11-20 DIAGNOSIS — K2270 Barrett's esophagus without dysplasia: Secondary | ICD-10-CM | POA: Diagnosis not present

## 2021-11-20 DIAGNOSIS — R918 Other nonspecific abnormal finding of lung field: Secondary | ICD-10-CM | POA: Diagnosis not present

## 2021-11-20 DIAGNOSIS — Z430 Encounter for attention to tracheostomy: Secondary | ICD-10-CM | POA: Diagnosis not present

## 2021-11-20 DIAGNOSIS — K22710 Barrett's esophagus with low grade dysplasia: Secondary | ICD-10-CM | POA: Diagnosis not present

## 2021-11-20 DIAGNOSIS — J95821 Acute postprocedural respiratory failure: Secondary | ICD-10-CM | POA: Diagnosis not present

## 2021-11-20 DIAGNOSIS — Z20822 Contact with and (suspected) exposure to covid-19: Secondary | ICD-10-CM | POA: Diagnosis not present

## 2021-11-20 DIAGNOSIS — L565 Disseminated superficial actinic porokeratosis (DSAP): Secondary | ICD-10-CM | POA: Diagnosis not present

## 2021-11-20 DIAGNOSIS — G9341 Metabolic encephalopathy: Secondary | ICD-10-CM | POA: Diagnosis not present

## 2021-11-20 DIAGNOSIS — Z6824 Body mass index (BMI) 24.0-24.9, adult: Secondary | ICD-10-CM | POA: Diagnosis not present

## 2021-11-20 DIAGNOSIS — Z452 Encounter for adjustment and management of vascular access device: Secondary | ICD-10-CM | POA: Diagnosis not present

## 2021-11-20 DIAGNOSIS — R079 Chest pain, unspecified: Secondary | ICD-10-CM | POA: Diagnosis not present

## 2021-11-20 DIAGNOSIS — J9690 Respiratory failure, unspecified, unspecified whether with hypoxia or hypercapnia: Secondary | ICD-10-CM | POA: Diagnosis not present

## 2021-11-20 DIAGNOSIS — A419 Sepsis, unspecified organism: Secondary | ICD-10-CM | POA: Diagnosis not present

## 2021-11-20 DIAGNOSIS — N3289 Other specified disorders of bladder: Secondary | ICD-10-CM | POA: Diagnosis not present

## 2021-11-20 DIAGNOSIS — R000 Tachycardia, unspecified: Secondary | ICD-10-CM | POA: Diagnosis not present

## 2021-11-20 DIAGNOSIS — Z9181 History of falling: Secondary | ICD-10-CM | POA: Diagnosis not present

## 2021-11-20 DIAGNOSIS — R600 Localized edema: Secondary | ICD-10-CM | POA: Diagnosis not present

## 2021-11-20 DIAGNOSIS — J9600 Acute respiratory failure, unspecified whether with hypoxia or hypercapnia: Secondary | ICD-10-CM | POA: Diagnosis not present

## 2021-11-20 DIAGNOSIS — Z6825 Body mass index (BMI) 25.0-25.9, adult: Secondary | ICD-10-CM | POA: Diagnosis not present

## 2021-11-20 DIAGNOSIS — J984 Other disorders of lung: Secondary | ICD-10-CM | POA: Diagnosis not present

## 2021-11-20 DIAGNOSIS — Z9189 Other specified personal risk factors, not elsewhere classified: Secondary | ICD-10-CM | POA: Diagnosis not present

## 2021-11-20 DIAGNOSIS — R197 Diarrhea, unspecified: Secondary | ICD-10-CM | POA: Diagnosis not present

## 2021-11-20 DIAGNOSIS — R531 Weakness: Secondary | ICD-10-CM | POA: Diagnosis not present

## 2021-11-20 DIAGNOSIS — R0789 Other chest pain: Secondary | ICD-10-CM | POA: Diagnosis not present

## 2021-11-20 DIAGNOSIS — T8144XA Sepsis following a procedure, initial encounter: Secondary | ICD-10-CM | POA: Diagnosis not present

## 2021-11-20 DIAGNOSIS — F419 Anxiety disorder, unspecified: Secondary | ICD-10-CM | POA: Diagnosis not present

## 2021-11-20 DIAGNOSIS — K9189 Other postprocedural complications and disorders of digestive system: Secondary | ICD-10-CM | POA: Diagnosis not present

## 2021-11-20 DIAGNOSIS — R5381 Other malaise: Secondary | ICD-10-CM | POA: Diagnosis not present

## 2021-11-21 DIAGNOSIS — D62 Acute posthemorrhagic anemia: Secondary | ICD-10-CM | POA: Diagnosis not present

## 2021-11-21 DIAGNOSIS — K2270 Barrett's esophagus without dysplasia: Secondary | ICD-10-CM | POA: Diagnosis not present

## 2021-11-21 DIAGNOSIS — R918 Other nonspecific abnormal finding of lung field: Secondary | ICD-10-CM | POA: Diagnosis not present

## 2021-11-21 DIAGNOSIS — G8918 Other acute postprocedural pain: Secondary | ICD-10-CM | POA: Diagnosis not present

## 2021-11-21 DIAGNOSIS — Z9889 Other specified postprocedural states: Secondary | ICD-10-CM | POA: Diagnosis not present

## 2021-11-21 DIAGNOSIS — I959 Hypotension, unspecified: Secondary | ICD-10-CM | POA: Diagnosis not present

## 2021-11-22 DIAGNOSIS — G8918 Other acute postprocedural pain: Secondary | ICD-10-CM | POA: Diagnosis not present

## 2021-11-22 DIAGNOSIS — K2100 Gastro-esophageal reflux disease with esophagitis, without bleeding: Secondary | ICD-10-CM | POA: Diagnosis not present

## 2021-11-22 DIAGNOSIS — K2270 Barrett's esophagus without dysplasia: Secondary | ICD-10-CM | POA: Diagnosis not present

## 2021-11-22 DIAGNOSIS — J939 Pneumothorax, unspecified: Secondary | ICD-10-CM | POA: Diagnosis not present

## 2021-11-22 DIAGNOSIS — I959 Hypotension, unspecified: Secondary | ICD-10-CM | POA: Diagnosis not present

## 2021-11-22 DIAGNOSIS — D62 Acute posthemorrhagic anemia: Secondary | ICD-10-CM | POA: Diagnosis not present

## 2021-11-22 DIAGNOSIS — Z9889 Other specified postprocedural states: Secondary | ICD-10-CM | POA: Diagnosis not present

## 2021-11-22 DIAGNOSIS — Z4682 Encounter for fitting and adjustment of non-vascular catheter: Secondary | ICD-10-CM | POA: Diagnosis not present

## 2021-11-22 DIAGNOSIS — K449 Diaphragmatic hernia without obstruction or gangrene: Secondary | ICD-10-CM | POA: Diagnosis not present

## 2021-11-23 DIAGNOSIS — R0789 Other chest pain: Secondary | ICD-10-CM | POA: Diagnosis not present

## 2021-11-23 DIAGNOSIS — I959 Hypotension, unspecified: Secondary | ICD-10-CM | POA: Diagnosis not present

## 2021-11-23 DIAGNOSIS — R109 Unspecified abdominal pain: Secondary | ICD-10-CM | POA: Diagnosis not present

## 2021-11-23 DIAGNOSIS — K2270 Barrett's esophagus without dysplasia: Secondary | ICD-10-CM | POA: Diagnosis not present

## 2021-11-23 DIAGNOSIS — R918 Other nonspecific abnormal finding of lung field: Secondary | ICD-10-CM | POA: Diagnosis not present

## 2021-11-23 DIAGNOSIS — K449 Diaphragmatic hernia without obstruction or gangrene: Secondary | ICD-10-CM | POA: Diagnosis not present

## 2021-11-23 DIAGNOSIS — D62 Acute posthemorrhagic anemia: Secondary | ICD-10-CM | POA: Diagnosis not present

## 2021-11-23 DIAGNOSIS — G8918 Other acute postprocedural pain: Secondary | ICD-10-CM | POA: Diagnosis not present

## 2021-11-23 DIAGNOSIS — Z9889 Other specified postprocedural states: Secondary | ICD-10-CM | POA: Diagnosis not present

## 2021-11-24 DIAGNOSIS — G8918 Other acute postprocedural pain: Secondary | ICD-10-CM | POA: Diagnosis not present

## 2021-11-24 DIAGNOSIS — Z9889 Other specified postprocedural states: Secondary | ICD-10-CM | POA: Diagnosis not present

## 2021-11-24 DIAGNOSIS — D62 Acute posthemorrhagic anemia: Secondary | ICD-10-CM | POA: Diagnosis not present

## 2021-11-24 DIAGNOSIS — R1310 Dysphagia, unspecified: Secondary | ICD-10-CM | POA: Diagnosis not present

## 2021-11-24 DIAGNOSIS — R000 Tachycardia, unspecified: Secondary | ICD-10-CM | POA: Diagnosis not present

## 2021-11-24 DIAGNOSIS — I959 Hypotension, unspecified: Secondary | ICD-10-CM | POA: Diagnosis not present

## 2021-11-24 DIAGNOSIS — R079 Chest pain, unspecified: Secondary | ICD-10-CM | POA: Diagnosis not present

## 2021-11-24 DIAGNOSIS — K2270 Barrett's esophagus without dysplasia: Secondary | ICD-10-CM | POA: Diagnosis not present

## 2021-11-25 DIAGNOSIS — J811 Chronic pulmonary edema: Secondary | ICD-10-CM | POA: Diagnosis not present

## 2021-11-25 DIAGNOSIS — K9189 Other postprocedural complications and disorders of digestive system: Secondary | ICD-10-CM | POA: Diagnosis not present

## 2021-11-25 DIAGNOSIS — Z9889 Other specified postprocedural states: Secondary | ICD-10-CM | POA: Diagnosis not present

## 2021-11-25 DIAGNOSIS — R0689 Other abnormalities of breathing: Secondary | ICD-10-CM | POA: Diagnosis not present

## 2021-11-25 DIAGNOSIS — D62 Acute posthemorrhagic anemia: Secondary | ICD-10-CM | POA: Diagnosis not present

## 2021-11-25 DIAGNOSIS — R000 Tachycardia, unspecified: Secondary | ICD-10-CM | POA: Diagnosis not present

## 2021-11-25 DIAGNOSIS — J90 Pleural effusion, not elsewhere classified: Secondary | ICD-10-CM | POA: Diagnosis not present

## 2021-11-25 DIAGNOSIS — G8918 Other acute postprocedural pain: Secondary | ICD-10-CM | POA: Diagnosis not present

## 2021-11-26 DIAGNOSIS — D62 Acute posthemorrhagic anemia: Secondary | ICD-10-CM | POA: Diagnosis not present

## 2021-11-26 DIAGNOSIS — J984 Other disorders of lung: Secondary | ICD-10-CM | POA: Diagnosis not present

## 2021-11-26 DIAGNOSIS — Z9911 Dependence on respirator [ventilator] status: Secondary | ICD-10-CM | POA: Diagnosis not present

## 2021-11-26 DIAGNOSIS — J9691 Respiratory failure, unspecified with hypoxia: Secondary | ICD-10-CM | POA: Diagnosis not present

## 2021-11-26 DIAGNOSIS — J9601 Acute respiratory failure with hypoxia: Secondary | ICD-10-CM | POA: Diagnosis not present

## 2021-11-26 DIAGNOSIS — Z4682 Encounter for fitting and adjustment of non-vascular catheter: Secondary | ICD-10-CM | POA: Diagnosis not present

## 2021-11-26 DIAGNOSIS — Z9889 Other specified postprocedural states: Secondary | ICD-10-CM | POA: Diagnosis not present

## 2021-11-26 DIAGNOSIS — G8918 Other acute postprocedural pain: Secondary | ICD-10-CM | POA: Diagnosis not present

## 2021-11-27 DIAGNOSIS — I959 Hypotension, unspecified: Secondary | ICD-10-CM | POA: Diagnosis not present

## 2021-11-27 DIAGNOSIS — R578 Other shock: Secondary | ICD-10-CM | POA: Diagnosis not present

## 2021-11-27 DIAGNOSIS — D62 Acute posthemorrhagic anemia: Secondary | ICD-10-CM | POA: Diagnosis not present

## 2021-11-27 DIAGNOSIS — G8918 Other acute postprocedural pain: Secondary | ICD-10-CM | POA: Diagnosis not present

## 2021-11-27 DIAGNOSIS — R918 Other nonspecific abnormal finding of lung field: Secondary | ICD-10-CM | POA: Diagnosis not present

## 2021-11-27 DIAGNOSIS — J90 Pleural effusion, not elsewhere classified: Secondary | ICD-10-CM | POA: Diagnosis not present

## 2021-11-27 DIAGNOSIS — J939 Pneumothorax, unspecified: Secondary | ICD-10-CM | POA: Diagnosis not present

## 2021-11-27 DIAGNOSIS — G9341 Metabolic encephalopathy: Secondary | ICD-10-CM | POA: Diagnosis not present

## 2021-11-27 DIAGNOSIS — J9601 Acute respiratory failure with hypoxia: Secondary | ICD-10-CM | POA: Diagnosis not present

## 2021-11-28 DIAGNOSIS — K2270 Barrett's esophagus without dysplasia: Secondary | ICD-10-CM | POA: Diagnosis not present

## 2021-11-28 DIAGNOSIS — Z452 Encounter for adjustment and management of vascular access device: Secondary | ICD-10-CM | POA: Diagnosis not present

## 2021-11-28 DIAGNOSIS — Z9889 Other specified postprocedural states: Secondary | ICD-10-CM | POA: Diagnosis not present

## 2021-11-28 DIAGNOSIS — G9341 Metabolic encephalopathy: Secondary | ICD-10-CM | POA: Diagnosis not present

## 2021-11-28 DIAGNOSIS — J9600 Acute respiratory failure, unspecified whether with hypoxia or hypercapnia: Secondary | ICD-10-CM | POA: Diagnosis not present

## 2021-11-28 DIAGNOSIS — K9189 Other postprocedural complications and disorders of digestive system: Secondary | ICD-10-CM | POA: Diagnosis not present

## 2021-11-28 DIAGNOSIS — J984 Other disorders of lung: Secondary | ICD-10-CM | POA: Diagnosis not present

## 2021-11-28 DIAGNOSIS — G8918 Other acute postprocedural pain: Secondary | ICD-10-CM | POA: Diagnosis not present

## 2021-11-28 DIAGNOSIS — R918 Other nonspecific abnormal finding of lung field: Secondary | ICD-10-CM | POA: Diagnosis not present

## 2021-11-28 DIAGNOSIS — Z4682 Encounter for fitting and adjustment of non-vascular catheter: Secondary | ICD-10-CM | POA: Diagnosis not present

## 2021-11-28 DIAGNOSIS — D62 Acute posthemorrhagic anemia: Secondary | ICD-10-CM | POA: Diagnosis not present

## 2021-11-28 DIAGNOSIS — K449 Diaphragmatic hernia without obstruction or gangrene: Secondary | ICD-10-CM | POA: Diagnosis not present

## 2021-11-29 DIAGNOSIS — J9601 Acute respiratory failure with hypoxia: Secondary | ICD-10-CM | POA: Diagnosis not present

## 2021-11-29 DIAGNOSIS — Z6825 Body mass index (BMI) 25.0-25.9, adult: Secondary | ICD-10-CM | POA: Diagnosis not present

## 2021-11-29 DIAGNOSIS — G8918 Other acute postprocedural pain: Secondary | ICD-10-CM | POA: Diagnosis not present

## 2021-11-29 DIAGNOSIS — D62 Acute posthemorrhagic anemia: Secondary | ICD-10-CM | POA: Diagnosis not present

## 2021-11-29 DIAGNOSIS — K449 Diaphragmatic hernia without obstruction or gangrene: Secondary | ICD-10-CM | POA: Diagnosis not present

## 2021-11-29 DIAGNOSIS — G9341 Metabolic encephalopathy: Secondary | ICD-10-CM | POA: Diagnosis not present

## 2021-11-30 DIAGNOSIS — G9341 Metabolic encephalopathy: Secondary | ICD-10-CM | POA: Diagnosis not present

## 2021-11-30 DIAGNOSIS — J9601 Acute respiratory failure with hypoxia: Secondary | ICD-10-CM | POA: Diagnosis not present

## 2021-11-30 DIAGNOSIS — G8918 Other acute postprocedural pain: Secondary | ICD-10-CM | POA: Diagnosis not present

## 2021-11-30 DIAGNOSIS — D62 Acute posthemorrhagic anemia: Secondary | ICD-10-CM | POA: Diagnosis not present

## 2021-11-30 DIAGNOSIS — K449 Diaphragmatic hernia without obstruction or gangrene: Secondary | ICD-10-CM | POA: Diagnosis not present

## 2021-12-01 DIAGNOSIS — G9341 Metabolic encephalopathy: Secondary | ICD-10-CM | POA: Diagnosis not present

## 2021-12-01 DIAGNOSIS — G8918 Other acute postprocedural pain: Secondary | ICD-10-CM | POA: Diagnosis not present

## 2021-12-01 DIAGNOSIS — K449 Diaphragmatic hernia without obstruction or gangrene: Secondary | ICD-10-CM | POA: Diagnosis not present

## 2021-12-01 DIAGNOSIS — J9601 Acute respiratory failure with hypoxia: Secondary | ICD-10-CM | POA: Diagnosis not present

## 2021-12-01 DIAGNOSIS — K9189 Other postprocedural complications and disorders of digestive system: Secondary | ICD-10-CM | POA: Diagnosis not present

## 2021-12-01 DIAGNOSIS — D62 Acute posthemorrhagic anemia: Secondary | ICD-10-CM | POA: Diagnosis not present

## 2021-12-02 DIAGNOSIS — G9341 Metabolic encephalopathy: Secondary | ICD-10-CM | POA: Diagnosis not present

## 2021-12-02 DIAGNOSIS — K9189 Other postprocedural complications and disorders of digestive system: Secondary | ICD-10-CM | POA: Diagnosis not present

## 2021-12-02 DIAGNOSIS — J80 Acute respiratory distress syndrome: Secondary | ICD-10-CM | POA: Diagnosis not present

## 2021-12-02 DIAGNOSIS — G8918 Other acute postprocedural pain: Secondary | ICD-10-CM | POA: Diagnosis not present

## 2021-12-02 DIAGNOSIS — J9601 Acute respiratory failure with hypoxia: Secondary | ICD-10-CM | POA: Diagnosis not present

## 2021-12-02 DIAGNOSIS — D62 Acute posthemorrhagic anemia: Secondary | ICD-10-CM | POA: Diagnosis not present

## 2021-12-02 DIAGNOSIS — B965 Pseudomonas (aeruginosa) (mallei) (pseudomallei) as the cause of diseases classified elsewhere: Secondary | ICD-10-CM | POA: Diagnosis not present

## 2021-12-02 DIAGNOSIS — A419 Sepsis, unspecified organism: Secondary | ICD-10-CM | POA: Diagnosis not present

## 2021-12-02 DIAGNOSIS — K449 Diaphragmatic hernia without obstruction or gangrene: Secondary | ICD-10-CM | POA: Diagnosis not present

## 2021-12-02 DIAGNOSIS — K651 Peritoneal abscess: Secondary | ICD-10-CM | POA: Diagnosis not present

## 2021-12-03 DIAGNOSIS — B965 Pseudomonas (aeruginosa) (mallei) (pseudomallei) as the cause of diseases classified elsewhere: Secondary | ICD-10-CM | POA: Diagnosis not present

## 2021-12-03 DIAGNOSIS — K651 Peritoneal abscess: Secondary | ICD-10-CM | POA: Diagnosis not present

## 2021-12-03 DIAGNOSIS — J80 Acute respiratory distress syndrome: Secondary | ICD-10-CM | POA: Diagnosis not present

## 2021-12-03 DIAGNOSIS — J9601 Acute respiratory failure with hypoxia: Secondary | ICD-10-CM | POA: Diagnosis not present

## 2021-12-03 DIAGNOSIS — K449 Diaphragmatic hernia without obstruction or gangrene: Secondary | ICD-10-CM | POA: Diagnosis not present

## 2021-12-03 DIAGNOSIS — G8918 Other acute postprocedural pain: Secondary | ICD-10-CM | POA: Diagnosis not present

## 2021-12-03 DIAGNOSIS — D62 Acute posthemorrhagic anemia: Secondary | ICD-10-CM | POA: Diagnosis not present

## 2021-12-03 DIAGNOSIS — Z6827 Body mass index (BMI) 27.0-27.9, adult: Secondary | ICD-10-CM | POA: Diagnosis not present

## 2021-12-03 DIAGNOSIS — A419 Sepsis, unspecified organism: Secondary | ICD-10-CM | POA: Diagnosis not present

## 2021-12-03 DIAGNOSIS — G9341 Metabolic encephalopathy: Secondary | ICD-10-CM | POA: Diagnosis not present

## 2021-12-04 DIAGNOSIS — D62 Acute posthemorrhagic anemia: Secondary | ICD-10-CM | POA: Diagnosis not present

## 2021-12-04 DIAGNOSIS — R410 Disorientation, unspecified: Secondary | ICD-10-CM | POA: Diagnosis not present

## 2021-12-04 DIAGNOSIS — K449 Diaphragmatic hernia without obstruction or gangrene: Secondary | ICD-10-CM | POA: Diagnosis not present

## 2021-12-04 DIAGNOSIS — K839 Disease of biliary tract, unspecified: Secondary | ICD-10-CM | POA: Diagnosis not present

## 2021-12-04 DIAGNOSIS — G8918 Other acute postprocedural pain: Secondary | ICD-10-CM | POA: Diagnosis not present

## 2021-12-04 DIAGNOSIS — Z4682 Encounter for fitting and adjustment of non-vascular catheter: Secondary | ICD-10-CM | POA: Diagnosis not present

## 2021-12-04 DIAGNOSIS — R918 Other nonspecific abnormal finding of lung field: Secondary | ICD-10-CM | POA: Diagnosis not present

## 2021-12-04 DIAGNOSIS — J90 Pleural effusion, not elsewhere classified: Secondary | ICD-10-CM | POA: Diagnosis not present

## 2021-12-04 DIAGNOSIS — R0902 Hypoxemia: Secondary | ICD-10-CM | POA: Diagnosis not present

## 2021-12-05 DIAGNOSIS — R0902 Hypoxemia: Secondary | ICD-10-CM | POA: Diagnosis not present

## 2021-12-05 DIAGNOSIS — R410 Disorientation, unspecified: Secondary | ICD-10-CM | POA: Diagnosis not present

## 2021-12-05 DIAGNOSIS — G8918 Other acute postprocedural pain: Secondary | ICD-10-CM | POA: Diagnosis not present

## 2021-12-05 DIAGNOSIS — M7989 Other specified soft tissue disorders: Secondary | ICD-10-CM | POA: Diagnosis not present

## 2021-12-05 DIAGNOSIS — Z4682 Encounter for fitting and adjustment of non-vascular catheter: Secondary | ICD-10-CM | POA: Diagnosis not present

## 2021-12-05 DIAGNOSIS — J984 Other disorders of lung: Secondary | ICD-10-CM | POA: Diagnosis not present

## 2021-12-05 DIAGNOSIS — D62 Acute posthemorrhagic anemia: Secondary | ICD-10-CM | POA: Diagnosis not present

## 2021-12-05 DIAGNOSIS — J90 Pleural effusion, not elsewhere classified: Secondary | ICD-10-CM | POA: Diagnosis not present

## 2021-12-05 DIAGNOSIS — K449 Diaphragmatic hernia without obstruction or gangrene: Secondary | ICD-10-CM | POA: Diagnosis not present

## 2021-12-06 DIAGNOSIS — R410 Disorientation, unspecified: Secondary | ICD-10-CM | POA: Diagnosis not present

## 2021-12-06 DIAGNOSIS — G8918 Other acute postprocedural pain: Secondary | ICD-10-CM | POA: Diagnosis not present

## 2021-12-06 DIAGNOSIS — R0902 Hypoxemia: Secondary | ICD-10-CM | POA: Diagnosis not present

## 2021-12-06 DIAGNOSIS — D62 Acute posthemorrhagic anemia: Secondary | ICD-10-CM | POA: Diagnosis not present

## 2021-12-06 DIAGNOSIS — K449 Diaphragmatic hernia without obstruction or gangrene: Secondary | ICD-10-CM | POA: Diagnosis not present

## 2021-12-06 DIAGNOSIS — Z9889 Other specified postprocedural states: Secondary | ICD-10-CM | POA: Diagnosis not present

## 2021-12-07 ENCOUNTER — Encounter: Payer: Self-pay | Admitting: Neurology

## 2021-12-07 ENCOUNTER — Ambulatory Visit: Payer: Medicare Other | Admitting: Neurology

## 2021-12-07 DIAGNOSIS — R0902 Hypoxemia: Secondary | ICD-10-CM | POA: Diagnosis not present

## 2021-12-07 DIAGNOSIS — K449 Diaphragmatic hernia without obstruction or gangrene: Secondary | ICD-10-CM | POA: Diagnosis not present

## 2021-12-07 DIAGNOSIS — Z9889 Other specified postprocedural states: Secondary | ICD-10-CM | POA: Diagnosis not present

## 2021-12-07 DIAGNOSIS — D62 Acute posthemorrhagic anemia: Secondary | ICD-10-CM | POA: Diagnosis not present

## 2021-12-07 DIAGNOSIS — R410 Disorientation, unspecified: Secondary | ICD-10-CM | POA: Diagnosis not present

## 2021-12-07 DIAGNOSIS — G8918 Other acute postprocedural pain: Secondary | ICD-10-CM | POA: Diagnosis not present

## 2021-12-08 DIAGNOSIS — D62 Acute posthemorrhagic anemia: Secondary | ICD-10-CM | POA: Diagnosis not present

## 2021-12-08 DIAGNOSIS — Z9889 Other specified postprocedural states: Secondary | ICD-10-CM | POA: Diagnosis not present

## 2021-12-08 DIAGNOSIS — J90 Pleural effusion, not elsewhere classified: Secondary | ICD-10-CM | POA: Diagnosis not present

## 2021-12-08 DIAGNOSIS — R410 Disorientation, unspecified: Secondary | ICD-10-CM | POA: Diagnosis not present

## 2021-12-08 DIAGNOSIS — K449 Diaphragmatic hernia without obstruction or gangrene: Secondary | ICD-10-CM | POA: Diagnosis not present

## 2021-12-08 DIAGNOSIS — R0902 Hypoxemia: Secondary | ICD-10-CM | POA: Diagnosis not present

## 2021-12-08 DIAGNOSIS — G8918 Other acute postprocedural pain: Secondary | ICD-10-CM | POA: Diagnosis not present

## 2021-12-09 DIAGNOSIS — K449 Diaphragmatic hernia without obstruction or gangrene: Secondary | ICD-10-CM | POA: Diagnosis not present

## 2021-12-09 DIAGNOSIS — G8918 Other acute postprocedural pain: Secondary | ICD-10-CM | POA: Diagnosis not present

## 2021-12-09 DIAGNOSIS — R0902 Hypoxemia: Secondary | ICD-10-CM | POA: Diagnosis not present

## 2021-12-09 DIAGNOSIS — D62 Acute posthemorrhagic anemia: Secondary | ICD-10-CM | POA: Diagnosis not present

## 2021-12-09 DIAGNOSIS — Z9889 Other specified postprocedural states: Secondary | ICD-10-CM | POA: Diagnosis not present

## 2021-12-10 DIAGNOSIS — R0902 Hypoxemia: Secondary | ICD-10-CM | POA: Diagnosis not present

## 2021-12-10 DIAGNOSIS — Z9911 Dependence on respirator [ventilator] status: Secondary | ICD-10-CM | POA: Diagnosis not present

## 2021-12-10 DIAGNOSIS — J948 Other specified pleural conditions: Secondary | ICD-10-CM | POA: Diagnosis not present

## 2021-12-10 DIAGNOSIS — D62 Acute posthemorrhagic anemia: Secondary | ICD-10-CM | POA: Diagnosis not present

## 2021-12-10 DIAGNOSIS — K651 Peritoneal abscess: Secondary | ICD-10-CM | POA: Diagnosis not present

## 2021-12-10 DIAGNOSIS — Z9889 Other specified postprocedural states: Secondary | ICD-10-CM | POA: Diagnosis not present

## 2021-12-10 DIAGNOSIS — K449 Diaphragmatic hernia without obstruction or gangrene: Secondary | ICD-10-CM | POA: Diagnosis not present

## 2021-12-10 DIAGNOSIS — J90 Pleural effusion, not elsewhere classified: Secondary | ICD-10-CM | POA: Diagnosis not present

## 2021-12-10 DIAGNOSIS — R918 Other nonspecific abnormal finding of lung field: Secondary | ICD-10-CM | POA: Diagnosis not present

## 2021-12-10 DIAGNOSIS — G8918 Other acute postprocedural pain: Secondary | ICD-10-CM | POA: Diagnosis not present

## 2021-12-10 DIAGNOSIS — R188 Other ascites: Secondary | ICD-10-CM | POA: Diagnosis not present

## 2021-12-10 HISTORY — PX: PARTIAL ESOPHAGECTOMY: SHX5287

## 2021-12-11 DIAGNOSIS — J939 Pneumothorax, unspecified: Secondary | ICD-10-CM | POA: Diagnosis not present

## 2021-12-11 DIAGNOSIS — Z9889 Other specified postprocedural states: Secondary | ICD-10-CM | POA: Diagnosis not present

## 2021-12-11 DIAGNOSIS — K449 Diaphragmatic hernia without obstruction or gangrene: Secondary | ICD-10-CM | POA: Diagnosis not present

## 2021-12-11 DIAGNOSIS — G8918 Other acute postprocedural pain: Secondary | ICD-10-CM | POA: Diagnosis not present

## 2021-12-11 DIAGNOSIS — R0902 Hypoxemia: Secondary | ICD-10-CM | POA: Diagnosis not present

## 2021-12-11 DIAGNOSIS — D62 Acute posthemorrhagic anemia: Secondary | ICD-10-CM | POA: Diagnosis not present

## 2021-12-11 DIAGNOSIS — Z4682 Encounter for fitting and adjustment of non-vascular catheter: Secondary | ICD-10-CM | POA: Diagnosis not present

## 2021-12-11 DIAGNOSIS — R188 Other ascites: Secondary | ICD-10-CM | POA: Diagnosis not present

## 2021-12-12 DIAGNOSIS — K449 Diaphragmatic hernia without obstruction or gangrene: Secondary | ICD-10-CM | POA: Diagnosis not present

## 2021-12-12 DIAGNOSIS — G8918 Other acute postprocedural pain: Secondary | ICD-10-CM | POA: Diagnosis not present

## 2021-12-12 DIAGNOSIS — Z9911 Dependence on respirator [ventilator] status: Secondary | ICD-10-CM | POA: Diagnosis not present

## 2021-12-12 DIAGNOSIS — R0902 Hypoxemia: Secondary | ICD-10-CM | POA: Diagnosis not present

## 2021-12-12 DIAGNOSIS — J939 Pneumothorax, unspecified: Secondary | ICD-10-CM | POA: Diagnosis not present

## 2021-12-12 DIAGNOSIS — D62 Acute posthemorrhagic anemia: Secondary | ICD-10-CM | POA: Diagnosis not present

## 2021-12-12 DIAGNOSIS — Z9889 Other specified postprocedural states: Secondary | ICD-10-CM | POA: Diagnosis not present

## 2021-12-13 DIAGNOSIS — R0902 Hypoxemia: Secondary | ICD-10-CM | POA: Diagnosis not present

## 2021-12-13 DIAGNOSIS — K9189 Other postprocedural complications and disorders of digestive system: Secondary | ICD-10-CM | POA: Diagnosis not present

## 2021-12-13 DIAGNOSIS — J984 Other disorders of lung: Secondary | ICD-10-CM | POA: Diagnosis not present

## 2021-12-13 DIAGNOSIS — Z9889 Other specified postprocedural states: Secondary | ICD-10-CM | POA: Diagnosis not present

## 2021-12-13 DIAGNOSIS — Z9911 Dependence on respirator [ventilator] status: Secondary | ICD-10-CM | POA: Diagnosis not present

## 2021-12-13 DIAGNOSIS — D62 Acute posthemorrhagic anemia: Secondary | ICD-10-CM | POA: Diagnosis not present

## 2021-12-13 DIAGNOSIS — G8918 Other acute postprocedural pain: Secondary | ICD-10-CM | POA: Diagnosis not present

## 2021-12-13 DIAGNOSIS — K449 Diaphragmatic hernia without obstruction or gangrene: Secondary | ICD-10-CM | POA: Diagnosis not present

## 2021-12-13 DIAGNOSIS — R109 Unspecified abdominal pain: Secondary | ICD-10-CM | POA: Diagnosis not present

## 2021-12-13 DIAGNOSIS — R410 Disorientation, unspecified: Secondary | ICD-10-CM | POA: Diagnosis not present

## 2021-12-13 DIAGNOSIS — F419 Anxiety disorder, unspecified: Secondary | ICD-10-CM | POA: Diagnosis not present

## 2021-12-14 DIAGNOSIS — Z6824 Body mass index (BMI) 24.0-24.9, adult: Secondary | ICD-10-CM | POA: Diagnosis not present

## 2021-12-14 DIAGNOSIS — Z9911 Dependence on respirator [ventilator] status: Secondary | ICD-10-CM | POA: Diagnosis not present

## 2021-12-14 DIAGNOSIS — R451 Restlessness and agitation: Secondary | ICD-10-CM | POA: Diagnosis not present

## 2021-12-14 DIAGNOSIS — D62 Acute posthemorrhagic anemia: Secondary | ICD-10-CM | POA: Diagnosis not present

## 2021-12-14 DIAGNOSIS — G8918 Other acute postprocedural pain: Secondary | ICD-10-CM | POA: Diagnosis not present

## 2021-12-14 DIAGNOSIS — K9189 Other postprocedural complications and disorders of digestive system: Secondary | ICD-10-CM | POA: Diagnosis not present

## 2021-12-14 DIAGNOSIS — R918 Other nonspecific abnormal finding of lung field: Secondary | ICD-10-CM | POA: Diagnosis not present

## 2021-12-14 DIAGNOSIS — F419 Anxiety disorder, unspecified: Secondary | ICD-10-CM | POA: Diagnosis not present

## 2021-12-14 DIAGNOSIS — Z9889 Other specified postprocedural states: Secondary | ICD-10-CM | POA: Diagnosis not present

## 2021-12-14 DIAGNOSIS — J9690 Respiratory failure, unspecified, unspecified whether with hypoxia or hypercapnia: Secondary | ICD-10-CM | POA: Diagnosis not present

## 2021-12-14 DIAGNOSIS — R188 Other ascites: Secondary | ICD-10-CM | POA: Diagnosis not present

## 2021-12-14 DIAGNOSIS — J811 Chronic pulmonary edema: Secondary | ICD-10-CM | POA: Diagnosis not present

## 2021-12-14 DIAGNOSIS — R0902 Hypoxemia: Secondary | ICD-10-CM | POA: Diagnosis not present

## 2021-12-14 DIAGNOSIS — K449 Diaphragmatic hernia without obstruction or gangrene: Secondary | ICD-10-CM | POA: Diagnosis not present

## 2021-12-15 DIAGNOSIS — G8918 Other acute postprocedural pain: Secondary | ICD-10-CM | POA: Diagnosis not present

## 2021-12-15 DIAGNOSIS — K2270 Barrett's esophagus without dysplasia: Secondary | ICD-10-CM | POA: Diagnosis not present

## 2021-12-15 DIAGNOSIS — Z4682 Encounter for fitting and adjustment of non-vascular catheter: Secondary | ICD-10-CM | POA: Diagnosis not present

## 2021-12-15 DIAGNOSIS — I959 Hypotension, unspecified: Secondary | ICD-10-CM | POA: Diagnosis not present

## 2021-12-15 DIAGNOSIS — F419 Anxiety disorder, unspecified: Secondary | ICD-10-CM | POA: Diagnosis not present

## 2021-12-15 DIAGNOSIS — J811 Chronic pulmonary edema: Secondary | ICD-10-CM | POA: Diagnosis not present

## 2021-12-15 DIAGNOSIS — R451 Restlessness and agitation: Secondary | ICD-10-CM | POA: Diagnosis not present

## 2021-12-15 DIAGNOSIS — Z9889 Other specified postprocedural states: Secondary | ICD-10-CM | POA: Diagnosis not present

## 2021-12-15 DIAGNOSIS — D62 Acute posthemorrhagic anemia: Secondary | ICD-10-CM | POA: Diagnosis not present

## 2021-12-15 DIAGNOSIS — J9601 Acute respiratory failure with hypoxia: Secondary | ICD-10-CM | POA: Diagnosis not present

## 2021-12-15 DIAGNOSIS — K9189 Other postprocedural complications and disorders of digestive system: Secondary | ICD-10-CM | POA: Diagnosis not present

## 2021-12-16 DIAGNOSIS — Z6824 Body mass index (BMI) 24.0-24.9, adult: Secondary | ICD-10-CM | POA: Diagnosis not present

## 2021-12-16 DIAGNOSIS — J9601 Acute respiratory failure with hypoxia: Secondary | ICD-10-CM | POA: Diagnosis not present

## 2021-12-16 DIAGNOSIS — K2270 Barrett's esophagus without dysplasia: Secondary | ICD-10-CM | POA: Diagnosis not present

## 2021-12-16 DIAGNOSIS — J9690 Respiratory failure, unspecified, unspecified whether with hypoxia or hypercapnia: Secondary | ICD-10-CM | POA: Diagnosis not present

## 2021-12-16 DIAGNOSIS — J90 Pleural effusion, not elsewhere classified: Secondary | ICD-10-CM | POA: Diagnosis not present

## 2021-12-16 DIAGNOSIS — B965 Pseudomonas (aeruginosa) (mallei) (pseudomallei) as the cause of diseases classified elsewhere: Secondary | ICD-10-CM | POA: Diagnosis not present

## 2021-12-16 DIAGNOSIS — K449 Diaphragmatic hernia without obstruction or gangrene: Secondary | ICD-10-CM | POA: Diagnosis not present

## 2021-12-16 DIAGNOSIS — I959 Hypotension, unspecified: Secondary | ICD-10-CM | POA: Diagnosis not present

## 2021-12-16 DIAGNOSIS — G8918 Other acute postprocedural pain: Secondary | ICD-10-CM | POA: Diagnosis not present

## 2021-12-16 DIAGNOSIS — K651 Peritoneal abscess: Secondary | ICD-10-CM | POA: Diagnosis not present

## 2021-12-16 DIAGNOSIS — Z9889 Other specified postprocedural states: Secondary | ICD-10-CM | POA: Diagnosis not present

## 2021-12-16 DIAGNOSIS — D62 Acute posthemorrhagic anemia: Secondary | ICD-10-CM | POA: Diagnosis not present

## 2021-12-16 DIAGNOSIS — J984 Other disorders of lung: Secondary | ICD-10-CM | POA: Diagnosis not present

## 2021-12-17 DIAGNOSIS — K2270 Barrett's esophagus without dysplasia: Secondary | ICD-10-CM | POA: Diagnosis not present

## 2021-12-17 DIAGNOSIS — K9189 Other postprocedural complications and disorders of digestive system: Secondary | ICD-10-CM | POA: Diagnosis not present

## 2021-12-17 DIAGNOSIS — G8918 Other acute postprocedural pain: Secondary | ICD-10-CM | POA: Diagnosis not present

## 2021-12-17 DIAGNOSIS — J9601 Acute respiratory failure with hypoxia: Secondary | ICD-10-CM | POA: Diagnosis not present

## 2021-12-17 DIAGNOSIS — Z9889 Other specified postprocedural states: Secondary | ICD-10-CM | POA: Diagnosis not present

## 2021-12-17 DIAGNOSIS — I959 Hypotension, unspecified: Secondary | ICD-10-CM | POA: Diagnosis not present

## 2021-12-17 DIAGNOSIS — Z6824 Body mass index (BMI) 24.0-24.9, adult: Secondary | ICD-10-CM | POA: Diagnosis not present

## 2021-12-17 DIAGNOSIS — Z4682 Encounter for fitting and adjustment of non-vascular catheter: Secondary | ICD-10-CM | POA: Diagnosis not present

## 2021-12-17 DIAGNOSIS — D62 Acute posthemorrhagic anemia: Secondary | ICD-10-CM | POA: Diagnosis not present

## 2021-12-18 DIAGNOSIS — K9189 Other postprocedural complications and disorders of digestive system: Secondary | ICD-10-CM | POA: Diagnosis not present

## 2021-12-18 DIAGNOSIS — Z9889 Other specified postprocedural states: Secondary | ICD-10-CM | POA: Diagnosis not present

## 2021-12-18 DIAGNOSIS — T17900A Unspecified foreign body in respiratory tract, part unspecified causing asphyxiation, initial encounter: Secondary | ICD-10-CM | POA: Diagnosis not present

## 2021-12-18 DIAGNOSIS — G8918 Other acute postprocedural pain: Secondary | ICD-10-CM | POA: Diagnosis not present

## 2021-12-18 DIAGNOSIS — J9601 Acute respiratory failure with hypoxia: Secondary | ICD-10-CM | POA: Diagnosis not present

## 2021-12-18 DIAGNOSIS — K449 Diaphragmatic hernia without obstruction or gangrene: Secondary | ICD-10-CM | POA: Diagnosis not present

## 2021-12-18 DIAGNOSIS — D62 Acute posthemorrhagic anemia: Secondary | ICD-10-CM | POA: Diagnosis not present

## 2021-12-18 DIAGNOSIS — I959 Hypotension, unspecified: Secondary | ICD-10-CM | POA: Diagnosis not present

## 2021-12-18 DIAGNOSIS — K651 Peritoneal abscess: Secondary | ICD-10-CM | POA: Diagnosis not present

## 2021-12-18 DIAGNOSIS — J151 Pneumonia due to Pseudomonas: Secondary | ICD-10-CM | POA: Diagnosis not present

## 2021-12-19 DIAGNOSIS — D62 Acute posthemorrhagic anemia: Secondary | ICD-10-CM | POA: Diagnosis not present

## 2021-12-19 DIAGNOSIS — R600 Localized edema: Secondary | ICD-10-CM | POA: Diagnosis not present

## 2021-12-19 DIAGNOSIS — D72829 Elevated white blood cell count, unspecified: Secondary | ICD-10-CM | POA: Diagnosis not present

## 2021-12-19 DIAGNOSIS — J9601 Acute respiratory failure with hypoxia: Secondary | ICD-10-CM | POA: Diagnosis not present

## 2021-12-19 DIAGNOSIS — G8918 Other acute postprocedural pain: Secondary | ICD-10-CM | POA: Diagnosis not present

## 2021-12-19 DIAGNOSIS — K449 Diaphragmatic hernia without obstruction or gangrene: Secondary | ICD-10-CM | POA: Diagnosis not present

## 2021-12-19 DIAGNOSIS — J90 Pleural effusion, not elsewhere classified: Secondary | ICD-10-CM | POA: Diagnosis not present

## 2021-12-19 DIAGNOSIS — I959 Hypotension, unspecified: Secondary | ICD-10-CM | POA: Diagnosis not present

## 2021-12-19 DIAGNOSIS — R509 Fever, unspecified: Secondary | ICD-10-CM | POA: Diagnosis not present

## 2021-12-19 DIAGNOSIS — R918 Other nonspecific abnormal finding of lung field: Secondary | ICD-10-CM | POA: Diagnosis not present

## 2021-12-19 DIAGNOSIS — Z9889 Other specified postprocedural states: Secondary | ICD-10-CM | POA: Diagnosis not present

## 2021-12-20 DIAGNOSIS — R918 Other nonspecific abnormal finding of lung field: Secondary | ICD-10-CM | POA: Diagnosis not present

## 2021-12-20 DIAGNOSIS — K651 Peritoneal abscess: Secondary | ICD-10-CM | POA: Diagnosis not present

## 2021-12-20 DIAGNOSIS — Z9889 Other specified postprocedural states: Secondary | ICD-10-CM | POA: Diagnosis not present

## 2021-12-20 DIAGNOSIS — J690 Pneumonitis due to inhalation of food and vomit: Secondary | ICD-10-CM | POA: Diagnosis not present

## 2021-12-20 DIAGNOSIS — L03114 Cellulitis of left upper limb: Secondary | ICD-10-CM | POA: Diagnosis not present

## 2021-12-20 DIAGNOSIS — J151 Pneumonia due to Pseudomonas: Secondary | ICD-10-CM | POA: Diagnosis not present

## 2021-12-20 DIAGNOSIS — J9601 Acute respiratory failure with hypoxia: Secondary | ICD-10-CM | POA: Diagnosis not present

## 2021-12-20 DIAGNOSIS — K9189 Other postprocedural complications and disorders of digestive system: Secondary | ICD-10-CM | POA: Diagnosis not present

## 2021-12-20 DIAGNOSIS — R140 Abdominal distension (gaseous): Secondary | ICD-10-CM | POA: Diagnosis not present

## 2021-12-20 DIAGNOSIS — K6389 Other specified diseases of intestine: Secondary | ICD-10-CM | POA: Diagnosis not present

## 2021-12-21 ENCOUNTER — Telehealth: Payer: Self-pay | Admitting: Neurology

## 2021-12-21 DIAGNOSIS — Z9889 Other specified postprocedural states: Secondary | ICD-10-CM | POA: Diagnosis not present

## 2021-12-21 DIAGNOSIS — K567 Ileus, unspecified: Secondary | ICD-10-CM | POA: Diagnosis not present

## 2021-12-21 DIAGNOSIS — R140 Abdominal distension (gaseous): Secondary | ICD-10-CM | POA: Diagnosis not present

## 2021-12-21 DIAGNOSIS — K449 Diaphragmatic hernia without obstruction or gangrene: Secondary | ICD-10-CM | POA: Diagnosis not present

## 2021-12-21 NOTE — Telephone Encounter (Signed)
Pt's daughter, Marval Regal wanted inform [facility] why pt missed his appt. Pt is in the hospital at [facility] was on ventilator 25 days and currently has trach

## 2021-12-22 DIAGNOSIS — Z452 Encounter for adjustment and management of vascular access device: Secondary | ICD-10-CM | POA: Diagnosis not present

## 2021-12-22 DIAGNOSIS — K449 Diaphragmatic hernia without obstruction or gangrene: Secondary | ICD-10-CM | POA: Diagnosis not present

## 2021-12-22 DIAGNOSIS — Z9889 Other specified postprocedural states: Secondary | ICD-10-CM | POA: Diagnosis not present

## 2021-12-23 DIAGNOSIS — J984 Other disorders of lung: Secondary | ICD-10-CM | POA: Diagnosis not present

## 2021-12-23 DIAGNOSIS — N3289 Other specified disorders of bladder: Secondary | ICD-10-CM | POA: Diagnosis not present

## 2021-12-23 DIAGNOSIS — T8143XA Infection following a procedure, organ and space surgical site, initial encounter: Secondary | ICD-10-CM | POA: Diagnosis not present

## 2021-12-23 DIAGNOSIS — B965 Pseudomonas (aeruginosa) (mallei) (pseudomallei) as the cause of diseases classified elsewhere: Secondary | ICD-10-CM | POA: Diagnosis not present

## 2021-12-23 DIAGNOSIS — J9811 Atelectasis: Secondary | ICD-10-CM | POA: Diagnosis not present

## 2021-12-23 DIAGNOSIS — J90 Pleural effusion, not elsewhere classified: Secondary | ICD-10-CM | POA: Diagnosis not present

## 2021-12-23 DIAGNOSIS — Z9889 Other specified postprocedural states: Secondary | ICD-10-CM | POA: Diagnosis not present

## 2021-12-23 DIAGNOSIS — K449 Diaphragmatic hernia without obstruction or gangrene: Secondary | ICD-10-CM | POA: Diagnosis not present

## 2021-12-23 DIAGNOSIS — K651 Peritoneal abscess: Secondary | ICD-10-CM | POA: Diagnosis not present

## 2021-12-24 DIAGNOSIS — Z9889 Other specified postprocedural states: Secondary | ICD-10-CM | POA: Diagnosis not present

## 2021-12-24 DIAGNOSIS — K449 Diaphragmatic hernia without obstruction or gangrene: Secondary | ICD-10-CM | POA: Diagnosis not present

## 2021-12-25 DIAGNOSIS — Z9889 Other specified postprocedural states: Secondary | ICD-10-CM | POA: Diagnosis not present

## 2021-12-25 DIAGNOSIS — R918 Other nonspecific abnormal finding of lung field: Secondary | ICD-10-CM | POA: Diagnosis not present

## 2021-12-25 DIAGNOSIS — K9189 Other postprocedural complications and disorders of digestive system: Secondary | ICD-10-CM | POA: Diagnosis not present

## 2021-12-26 DIAGNOSIS — Z9889 Other specified postprocedural states: Secondary | ICD-10-CM | POA: Diagnosis not present

## 2021-12-26 DIAGNOSIS — R918 Other nonspecific abnormal finding of lung field: Secondary | ICD-10-CM | POA: Diagnosis not present

## 2021-12-27 DIAGNOSIS — Z9889 Other specified postprocedural states: Secondary | ICD-10-CM | POA: Diagnosis not present

## 2021-12-27 DIAGNOSIS — J90 Pleural effusion, not elsewhere classified: Secondary | ICD-10-CM | POA: Diagnosis not present

## 2021-12-29 DIAGNOSIS — Z9889 Other specified postprocedural states: Secondary | ICD-10-CM | POA: Diagnosis not present

## 2021-12-29 DIAGNOSIS — R918 Other nonspecific abnormal finding of lung field: Secondary | ICD-10-CM | POA: Diagnosis not present

## 2021-12-29 DIAGNOSIS — Z430 Encounter for attention to tracheostomy: Secondary | ICD-10-CM | POA: Diagnosis not present

## 2021-12-30 DIAGNOSIS — R918 Other nonspecific abnormal finding of lung field: Secondary | ICD-10-CM | POA: Diagnosis not present

## 2021-12-30 DIAGNOSIS — Z9889 Other specified postprocedural states: Secondary | ICD-10-CM | POA: Diagnosis not present

## 2022-01-08 DIAGNOSIS — K219 Gastro-esophageal reflux disease without esophagitis: Secondary | ICD-10-CM | POA: Diagnosis not present

## 2022-01-08 DIAGNOSIS — K567 Ileus, unspecified: Secondary | ICD-10-CM | POA: Diagnosis not present

## 2022-01-08 DIAGNOSIS — K9413 Enterostomy malfunction: Secondary | ICD-10-CM | POA: Diagnosis not present

## 2022-01-08 DIAGNOSIS — Z931 Gastrostomy status: Secondary | ICD-10-CM | POA: Diagnosis not present

## 2022-01-08 DIAGNOSIS — R2689 Other abnormalities of gait and mobility: Secondary | ICD-10-CM | POA: Diagnosis not present

## 2022-01-08 DIAGNOSIS — Z743 Need for continuous supervision: Secondary | ICD-10-CM | POA: Diagnosis not present

## 2022-01-08 DIAGNOSIS — M6250 Muscle wasting and atrophy, not elsewhere classified, unspecified site: Secondary | ICD-10-CM | POA: Diagnosis not present

## 2022-01-08 DIAGNOSIS — I2510 Atherosclerotic heart disease of native coronary artery without angina pectoris: Secondary | ICD-10-CM | POA: Diagnosis not present

## 2022-01-08 DIAGNOSIS — J45909 Unspecified asthma, uncomplicated: Secondary | ICD-10-CM | POA: Diagnosis not present

## 2022-01-08 DIAGNOSIS — R918 Other nonspecific abnormal finding of lung field: Secondary | ICD-10-CM | POA: Diagnosis not present

## 2022-01-08 DIAGNOSIS — J984 Other disorders of lung: Secondary | ICD-10-CM | POA: Diagnosis not present

## 2022-01-08 DIAGNOSIS — J9601 Acute respiratory failure with hypoxia: Secondary | ICD-10-CM | POA: Diagnosis not present

## 2022-01-08 DIAGNOSIS — R1013 Epigastric pain: Secondary | ICD-10-CM | POA: Diagnosis not present

## 2022-01-08 DIAGNOSIS — R410 Disorientation, unspecified: Secondary | ICD-10-CM | POA: Diagnosis not present

## 2022-01-08 DIAGNOSIS — T85528A Displacement of other gastrointestinal prosthetic devices, implants and grafts, initial encounter: Secondary | ICD-10-CM | POA: Diagnosis not present

## 2022-01-08 DIAGNOSIS — Z79899 Other long term (current) drug therapy: Secondary | ICD-10-CM | POA: Diagnosis not present

## 2022-01-08 DIAGNOSIS — R634 Abnormal weight loss: Secondary | ICD-10-CM | POA: Diagnosis not present

## 2022-01-08 DIAGNOSIS — D649 Anemia, unspecified: Secondary | ICD-10-CM | POA: Diagnosis not present

## 2022-01-08 DIAGNOSIS — K9419 Other complications of enterostomy: Secondary | ICD-10-CM | POA: Diagnosis not present

## 2022-01-08 DIAGNOSIS — G7281 Critical illness myopathy: Secondary | ICD-10-CM | POA: Diagnosis not present

## 2022-01-08 DIAGNOSIS — I700 Atherosclerosis of aorta: Secondary | ICD-10-CM | POA: Diagnosis not present

## 2022-01-08 DIAGNOSIS — I10 Essential (primary) hypertension: Secondary | ICD-10-CM | POA: Diagnosis not present

## 2022-01-08 DIAGNOSIS — E639 Nutritional deficiency, unspecified: Secondary | ICD-10-CM | POA: Diagnosis not present

## 2022-01-08 DIAGNOSIS — R630 Anorexia: Secondary | ICD-10-CM | POA: Diagnosis not present

## 2022-01-08 DIAGNOSIS — K5989 Other specified functional intestinal disorders: Secondary | ICD-10-CM | POA: Diagnosis not present

## 2022-01-08 DIAGNOSIS — R52 Pain, unspecified: Secondary | ICD-10-CM | POA: Diagnosis not present

## 2022-01-08 DIAGNOSIS — R531 Weakness: Secondary | ICD-10-CM | POA: Diagnosis not present

## 2022-01-08 DIAGNOSIS — Z434 Encounter for attention to other artificial openings of digestive tract: Secondary | ICD-10-CM | POA: Diagnosis not present

## 2022-01-08 DIAGNOSIS — R269 Unspecified abnormalities of gait and mobility: Secondary | ICD-10-CM | POA: Diagnosis not present

## 2022-01-08 DIAGNOSIS — J90 Pleural effusion, not elsewhere classified: Secondary | ICD-10-CM | POA: Diagnosis not present

## 2022-01-08 DIAGNOSIS — R0689 Other abnormalities of breathing: Secondary | ICD-10-CM | POA: Diagnosis not present

## 2022-01-08 DIAGNOSIS — R519 Headache, unspecified: Secondary | ICD-10-CM | POA: Diagnosis not present

## 2022-01-08 DIAGNOSIS — R110 Nausea: Secondary | ICD-10-CM | POA: Diagnosis not present

## 2022-01-08 DIAGNOSIS — Z9889 Other specified postprocedural states: Secondary | ICD-10-CM | POA: Diagnosis not present

## 2022-01-08 DIAGNOSIS — E871 Hypo-osmolality and hyponatremia: Secondary | ICD-10-CM | POA: Diagnosis not present

## 2022-01-08 DIAGNOSIS — R1084 Generalized abdominal pain: Secondary | ICD-10-CM | POA: Diagnosis not present

## 2022-01-08 DIAGNOSIS — K769 Liver disease, unspecified: Secondary | ICD-10-CM | POA: Diagnosis not present

## 2022-01-08 DIAGNOSIS — D62 Acute posthemorrhagic anemia: Secondary | ICD-10-CM | POA: Diagnosis not present

## 2022-01-08 DIAGNOSIS — K9189 Other postprocedural complications and disorders of digestive system: Secondary | ICD-10-CM | POA: Diagnosis not present

## 2022-01-08 DIAGNOSIS — J9690 Respiratory failure, unspecified, unspecified whether with hypoxia or hypercapnia: Secondary | ICD-10-CM | POA: Diagnosis not present

## 2022-01-08 DIAGNOSIS — R112 Nausea with vomiting, unspecified: Secondary | ICD-10-CM | POA: Diagnosis not present

## 2022-01-08 DIAGNOSIS — Z9049 Acquired absence of other specified parts of digestive tract: Secondary | ICD-10-CM | POA: Diagnosis not present

## 2022-01-08 DIAGNOSIS — R109 Unspecified abdominal pain: Secondary | ICD-10-CM | POA: Diagnosis not present

## 2022-01-08 DIAGNOSIS — R1110 Vomiting, unspecified: Secondary | ICD-10-CM | POA: Diagnosis not present

## 2022-01-08 DIAGNOSIS — R197 Diarrhea, unspecified: Secondary | ICD-10-CM | POA: Diagnosis not present

## 2022-01-08 DIAGNOSIS — Z48815 Encounter for surgical aftercare following surgery on the digestive system: Secondary | ICD-10-CM | POA: Diagnosis not present

## 2022-01-09 DIAGNOSIS — K219 Gastro-esophageal reflux disease without esophagitis: Secondary | ICD-10-CM | POA: Diagnosis not present

## 2022-01-09 DIAGNOSIS — G7281 Critical illness myopathy: Secondary | ICD-10-CM | POA: Diagnosis not present

## 2022-01-09 DIAGNOSIS — Z9049 Acquired absence of other specified parts of digestive tract: Secondary | ICD-10-CM | POA: Diagnosis not present

## 2022-01-09 DIAGNOSIS — R269 Unspecified abnormalities of gait and mobility: Secondary | ICD-10-CM | POA: Diagnosis not present

## 2022-01-09 DIAGNOSIS — I10 Essential (primary) hypertension: Secondary | ICD-10-CM | POA: Diagnosis not present

## 2022-01-09 DIAGNOSIS — D62 Acute posthemorrhagic anemia: Secondary | ICD-10-CM | POA: Diagnosis not present

## 2022-01-09 DIAGNOSIS — Z9889 Other specified postprocedural states: Secondary | ICD-10-CM | POA: Diagnosis not present

## 2022-01-09 DIAGNOSIS — J9690 Respiratory failure, unspecified, unspecified whether with hypoxia or hypercapnia: Secondary | ICD-10-CM | POA: Diagnosis not present

## 2022-01-10 DIAGNOSIS — Z9889 Other specified postprocedural states: Secondary | ICD-10-CM | POA: Diagnosis not present

## 2022-01-10 DIAGNOSIS — D62 Acute posthemorrhagic anemia: Secondary | ICD-10-CM | POA: Diagnosis not present

## 2022-01-10 DIAGNOSIS — R269 Unspecified abnormalities of gait and mobility: Secondary | ICD-10-CM | POA: Diagnosis not present

## 2022-01-10 DIAGNOSIS — G7281 Critical illness myopathy: Secondary | ICD-10-CM | POA: Diagnosis not present

## 2022-01-11 DIAGNOSIS — I10 Essential (primary) hypertension: Secondary | ICD-10-CM | POA: Diagnosis not present

## 2022-01-11 DIAGNOSIS — Z9049 Acquired absence of other specified parts of digestive tract: Secondary | ICD-10-CM | POA: Diagnosis not present

## 2022-01-11 DIAGNOSIS — G7281 Critical illness myopathy: Secondary | ICD-10-CM | POA: Diagnosis not present

## 2022-01-11 DIAGNOSIS — R269 Unspecified abnormalities of gait and mobility: Secondary | ICD-10-CM | POA: Diagnosis not present

## 2022-01-11 DIAGNOSIS — J9690 Respiratory failure, unspecified, unspecified whether with hypoxia or hypercapnia: Secondary | ICD-10-CM | POA: Diagnosis not present

## 2022-01-11 DIAGNOSIS — D62 Acute posthemorrhagic anemia: Secondary | ICD-10-CM | POA: Diagnosis not present

## 2022-01-11 DIAGNOSIS — Z9889 Other specified postprocedural states: Secondary | ICD-10-CM | POA: Diagnosis not present

## 2022-01-11 DIAGNOSIS — K219 Gastro-esophageal reflux disease without esophagitis: Secondary | ICD-10-CM | POA: Diagnosis not present

## 2022-01-12 DIAGNOSIS — G7281 Critical illness myopathy: Secondary | ICD-10-CM | POA: Diagnosis not present

## 2022-01-12 DIAGNOSIS — D62 Acute posthemorrhagic anemia: Secondary | ICD-10-CM | POA: Diagnosis not present

## 2022-01-12 DIAGNOSIS — R269 Unspecified abnormalities of gait and mobility: Secondary | ICD-10-CM | POA: Diagnosis not present

## 2022-01-12 DIAGNOSIS — Z9889 Other specified postprocedural states: Secondary | ICD-10-CM | POA: Diagnosis not present

## 2022-01-13 DIAGNOSIS — R269 Unspecified abnormalities of gait and mobility: Secondary | ICD-10-CM | POA: Diagnosis not present

## 2022-01-13 DIAGNOSIS — G7281 Critical illness myopathy: Secondary | ICD-10-CM | POA: Diagnosis not present

## 2022-01-13 DIAGNOSIS — Z9889 Other specified postprocedural states: Secondary | ICD-10-CM | POA: Diagnosis not present

## 2022-01-13 DIAGNOSIS — D62 Acute posthemorrhagic anemia: Secondary | ICD-10-CM | POA: Diagnosis not present

## 2022-01-14 DIAGNOSIS — I10 Essential (primary) hypertension: Secondary | ICD-10-CM | POA: Diagnosis not present

## 2022-01-14 DIAGNOSIS — Z9049 Acquired absence of other specified parts of digestive tract: Secondary | ICD-10-CM | POA: Diagnosis not present

## 2022-01-14 DIAGNOSIS — E871 Hypo-osmolality and hyponatremia: Secondary | ICD-10-CM | POA: Diagnosis not present

## 2022-01-14 DIAGNOSIS — D62 Acute posthemorrhagic anemia: Secondary | ICD-10-CM | POA: Diagnosis not present

## 2022-01-14 DIAGNOSIS — K219 Gastro-esophageal reflux disease without esophagitis: Secondary | ICD-10-CM | POA: Diagnosis not present

## 2022-01-14 DIAGNOSIS — J9690 Respiratory failure, unspecified, unspecified whether with hypoxia or hypercapnia: Secondary | ICD-10-CM | POA: Diagnosis not present

## 2022-01-14 DIAGNOSIS — G7281 Critical illness myopathy: Secondary | ICD-10-CM | POA: Diagnosis not present

## 2022-01-14 DIAGNOSIS — Z9889 Other specified postprocedural states: Secondary | ICD-10-CM | POA: Diagnosis not present

## 2022-01-14 DIAGNOSIS — R269 Unspecified abnormalities of gait and mobility: Secondary | ICD-10-CM | POA: Diagnosis not present

## 2022-01-15 DIAGNOSIS — K567 Ileus, unspecified: Secondary | ICD-10-CM | POA: Diagnosis not present

## 2022-01-16 DIAGNOSIS — T85528A Displacement of other gastrointestinal prosthetic devices, implants and grafts, initial encounter: Secondary | ICD-10-CM | POA: Diagnosis not present

## 2022-01-16 DIAGNOSIS — R197 Diarrhea, unspecified: Secondary | ICD-10-CM | POA: Diagnosis not present

## 2022-01-16 DIAGNOSIS — K9419 Other complications of enterostomy: Secondary | ICD-10-CM | POA: Diagnosis not present

## 2022-01-16 DIAGNOSIS — Z79899 Other long term (current) drug therapy: Secondary | ICD-10-CM | POA: Diagnosis not present

## 2022-01-16 DIAGNOSIS — D62 Acute posthemorrhagic anemia: Secondary | ICD-10-CM | POA: Diagnosis not present

## 2022-01-16 DIAGNOSIS — K9413 Enterostomy malfunction: Secondary | ICD-10-CM | POA: Diagnosis not present

## 2022-01-16 DIAGNOSIS — E871 Hypo-osmolality and hyponatremia: Secondary | ICD-10-CM | POA: Diagnosis not present

## 2022-01-16 DIAGNOSIS — R1013 Epigastric pain: Secondary | ICD-10-CM | POA: Diagnosis not present

## 2022-01-17 DIAGNOSIS — Z9049 Acquired absence of other specified parts of digestive tract: Secondary | ICD-10-CM | POA: Diagnosis not present

## 2022-01-17 DIAGNOSIS — E871 Hypo-osmolality and hyponatremia: Secondary | ICD-10-CM | POA: Diagnosis not present

## 2022-01-17 DIAGNOSIS — K219 Gastro-esophageal reflux disease without esophagitis: Secondary | ICD-10-CM | POA: Diagnosis not present

## 2022-01-17 DIAGNOSIS — Z9889 Other specified postprocedural states: Secondary | ICD-10-CM | POA: Diagnosis not present

## 2022-01-17 DIAGNOSIS — I10 Essential (primary) hypertension: Secondary | ICD-10-CM | POA: Diagnosis not present

## 2022-01-17 DIAGNOSIS — D62 Acute posthemorrhagic anemia: Secondary | ICD-10-CM | POA: Diagnosis not present

## 2022-01-17 DIAGNOSIS — J9690 Respiratory failure, unspecified, unspecified whether with hypoxia or hypercapnia: Secondary | ICD-10-CM | POA: Diagnosis not present

## 2022-01-18 DIAGNOSIS — I10 Essential (primary) hypertension: Secondary | ICD-10-CM | POA: Diagnosis not present

## 2022-01-18 DIAGNOSIS — J9690 Respiratory failure, unspecified, unspecified whether with hypoxia or hypercapnia: Secondary | ICD-10-CM | POA: Diagnosis not present

## 2022-01-18 DIAGNOSIS — E871 Hypo-osmolality and hyponatremia: Secondary | ICD-10-CM | POA: Diagnosis not present

## 2022-01-18 DIAGNOSIS — G7281 Critical illness myopathy: Secondary | ICD-10-CM | POA: Diagnosis not present

## 2022-01-18 DIAGNOSIS — R110 Nausea: Secondary | ICD-10-CM | POA: Diagnosis not present

## 2022-01-18 DIAGNOSIS — R269 Unspecified abnormalities of gait and mobility: Secondary | ICD-10-CM | POA: Diagnosis not present

## 2022-01-18 DIAGNOSIS — Z9889 Other specified postprocedural states: Secondary | ICD-10-CM | POA: Diagnosis not present

## 2022-01-18 DIAGNOSIS — K219 Gastro-esophageal reflux disease without esophagitis: Secondary | ICD-10-CM | POA: Diagnosis not present

## 2022-01-18 DIAGNOSIS — Z9049 Acquired absence of other specified parts of digestive tract: Secondary | ICD-10-CM | POA: Diagnosis not present

## 2022-01-18 DIAGNOSIS — D62 Acute posthemorrhagic anemia: Secondary | ICD-10-CM | POA: Diagnosis not present

## 2022-01-19 DIAGNOSIS — K9189 Other postprocedural complications and disorders of digestive system: Secondary | ICD-10-CM | POA: Diagnosis not present

## 2022-01-19 DIAGNOSIS — Z9049 Acquired absence of other specified parts of digestive tract: Secondary | ICD-10-CM | POA: Diagnosis not present

## 2022-01-19 DIAGNOSIS — D62 Acute posthemorrhagic anemia: Secondary | ICD-10-CM | POA: Diagnosis not present

## 2022-01-19 DIAGNOSIS — G7281 Critical illness myopathy: Secondary | ICD-10-CM | POA: Diagnosis not present

## 2022-01-19 DIAGNOSIS — R269 Unspecified abnormalities of gait and mobility: Secondary | ICD-10-CM | POA: Diagnosis not present

## 2022-01-19 DIAGNOSIS — Z9889 Other specified postprocedural states: Secondary | ICD-10-CM | POA: Diagnosis not present

## 2022-01-20 DIAGNOSIS — K219 Gastro-esophageal reflux disease without esophagitis: Secondary | ICD-10-CM | POA: Diagnosis not present

## 2022-01-20 DIAGNOSIS — R110 Nausea: Secondary | ICD-10-CM | POA: Diagnosis not present

## 2022-01-20 DIAGNOSIS — J9690 Respiratory failure, unspecified, unspecified whether with hypoxia or hypercapnia: Secondary | ICD-10-CM | POA: Diagnosis not present

## 2022-01-20 DIAGNOSIS — D62 Acute posthemorrhagic anemia: Secondary | ICD-10-CM | POA: Diagnosis not present

## 2022-01-20 DIAGNOSIS — I10 Essential (primary) hypertension: Secondary | ICD-10-CM | POA: Diagnosis not present

## 2022-01-20 DIAGNOSIS — Z9889 Other specified postprocedural states: Secondary | ICD-10-CM | POA: Diagnosis not present

## 2022-01-20 DIAGNOSIS — G7281 Critical illness myopathy: Secondary | ICD-10-CM | POA: Diagnosis not present

## 2022-01-20 DIAGNOSIS — Z9049 Acquired absence of other specified parts of digestive tract: Secondary | ICD-10-CM | POA: Diagnosis not present

## 2022-01-20 DIAGNOSIS — R269 Unspecified abnormalities of gait and mobility: Secondary | ICD-10-CM | POA: Diagnosis not present

## 2022-01-21 DIAGNOSIS — Z9889 Other specified postprocedural states: Secondary | ICD-10-CM | POA: Diagnosis not present

## 2022-01-21 DIAGNOSIS — D62 Acute posthemorrhagic anemia: Secondary | ICD-10-CM | POA: Diagnosis not present

## 2022-01-21 DIAGNOSIS — G7281 Critical illness myopathy: Secondary | ICD-10-CM | POA: Diagnosis not present

## 2022-01-21 DIAGNOSIS — R269 Unspecified abnormalities of gait and mobility: Secondary | ICD-10-CM | POA: Diagnosis not present

## 2022-01-22 DIAGNOSIS — G7281 Critical illness myopathy: Secondary | ICD-10-CM | POA: Diagnosis not present

## 2022-01-22 DIAGNOSIS — Z9889 Other specified postprocedural states: Secondary | ICD-10-CM | POA: Diagnosis not present

## 2022-01-22 DIAGNOSIS — D62 Acute posthemorrhagic anemia: Secondary | ICD-10-CM | POA: Diagnosis not present

## 2022-01-22 DIAGNOSIS — R269 Unspecified abnormalities of gait and mobility: Secondary | ICD-10-CM | POA: Diagnosis not present

## 2022-01-25 DIAGNOSIS — Z9889 Other specified postprocedural states: Secondary | ICD-10-CM | POA: Diagnosis not present

## 2022-01-25 DIAGNOSIS — G7281 Critical illness myopathy: Secondary | ICD-10-CM | POA: Diagnosis not present

## 2022-01-25 DIAGNOSIS — D62 Acute posthemorrhagic anemia: Secondary | ICD-10-CM | POA: Diagnosis not present

## 2022-01-25 DIAGNOSIS — R269 Unspecified abnormalities of gait and mobility: Secondary | ICD-10-CM | POA: Diagnosis not present

## 2022-01-26 DIAGNOSIS — G7281 Critical illness myopathy: Secondary | ICD-10-CM | POA: Diagnosis not present

## 2022-01-26 DIAGNOSIS — R269 Unspecified abnormalities of gait and mobility: Secondary | ICD-10-CM | POA: Diagnosis not present

## 2022-01-26 DIAGNOSIS — I700 Atherosclerosis of aorta: Secondary | ICD-10-CM | POA: Diagnosis not present

## 2022-01-26 DIAGNOSIS — K769 Liver disease, unspecified: Secondary | ICD-10-CM | POA: Diagnosis not present

## 2022-01-26 DIAGNOSIS — J90 Pleural effusion, not elsewhere classified: Secondary | ICD-10-CM | POA: Diagnosis not present

## 2022-01-26 DIAGNOSIS — K5989 Other specified functional intestinal disorders: Secondary | ICD-10-CM | POA: Diagnosis not present

## 2022-01-26 DIAGNOSIS — D62 Acute posthemorrhagic anemia: Secondary | ICD-10-CM | POA: Diagnosis not present

## 2022-01-26 DIAGNOSIS — I2510 Atherosclerotic heart disease of native coronary artery without angina pectoris: Secondary | ICD-10-CM | POA: Diagnosis not present

## 2022-01-26 DIAGNOSIS — Z9889 Other specified postprocedural states: Secondary | ICD-10-CM | POA: Diagnosis not present

## 2022-01-26 DIAGNOSIS — J984 Other disorders of lung: Secondary | ICD-10-CM | POA: Diagnosis not present

## 2022-01-26 DIAGNOSIS — R918 Other nonspecific abnormal finding of lung field: Secondary | ICD-10-CM | POA: Diagnosis not present

## 2022-01-26 DIAGNOSIS — Z9049 Acquired absence of other specified parts of digestive tract: Secondary | ICD-10-CM | POA: Diagnosis not present

## 2022-01-27 DIAGNOSIS — D62 Acute posthemorrhagic anemia: Secondary | ICD-10-CM | POA: Diagnosis not present

## 2022-01-27 DIAGNOSIS — R269 Unspecified abnormalities of gait and mobility: Secondary | ICD-10-CM | POA: Diagnosis not present

## 2022-01-27 DIAGNOSIS — Z9889 Other specified postprocedural states: Secondary | ICD-10-CM | POA: Diagnosis not present

## 2022-01-27 DIAGNOSIS — G7281 Critical illness myopathy: Secondary | ICD-10-CM | POA: Diagnosis not present

## 2022-01-28 DIAGNOSIS — G7281 Critical illness myopathy: Secondary | ICD-10-CM | POA: Diagnosis not present

## 2022-01-28 DIAGNOSIS — Z9889 Other specified postprocedural states: Secondary | ICD-10-CM | POA: Diagnosis not present

## 2022-01-28 DIAGNOSIS — D62 Acute posthemorrhagic anemia: Secondary | ICD-10-CM | POA: Diagnosis not present

## 2022-01-28 DIAGNOSIS — R269 Unspecified abnormalities of gait and mobility: Secondary | ICD-10-CM | POA: Diagnosis not present

## 2022-01-29 DIAGNOSIS — R269 Unspecified abnormalities of gait and mobility: Secondary | ICD-10-CM | POA: Diagnosis not present

## 2022-01-29 DIAGNOSIS — D62 Acute posthemorrhagic anemia: Secondary | ICD-10-CM | POA: Diagnosis not present

## 2022-01-29 DIAGNOSIS — Z9889 Other specified postprocedural states: Secondary | ICD-10-CM | POA: Diagnosis not present

## 2022-01-29 DIAGNOSIS — G7281 Critical illness myopathy: Secondary | ICD-10-CM | POA: Diagnosis not present

## 2022-02-02 DIAGNOSIS — E782 Mixed hyperlipidemia: Secondary | ICD-10-CM | POA: Diagnosis not present

## 2022-02-02 DIAGNOSIS — R110 Nausea: Secondary | ICD-10-CM | POA: Diagnosis not present

## 2022-02-02 DIAGNOSIS — K208 Other esophagitis without bleeding: Secondary | ICD-10-CM | POA: Diagnosis not present

## 2022-02-02 DIAGNOSIS — R109 Unspecified abdominal pain: Secondary | ICD-10-CM | POA: Diagnosis not present

## 2022-02-02 DIAGNOSIS — Z980 Intestinal bypass and anastomosis status: Secondary | ICD-10-CM | POA: Diagnosis not present

## 2022-02-02 DIAGNOSIS — R079 Chest pain, unspecified: Secondary | ICD-10-CM | POA: Diagnosis not present

## 2022-02-02 DIAGNOSIS — Z7982 Long term (current) use of aspirin: Secondary | ICD-10-CM | POA: Diagnosis not present

## 2022-02-02 DIAGNOSIS — R1310 Dysphagia, unspecified: Secondary | ICD-10-CM | POA: Diagnosis not present

## 2022-02-02 DIAGNOSIS — Z934 Other artificial openings of gastrointestinal tract status: Secondary | ICD-10-CM | POA: Diagnosis not present

## 2022-02-02 DIAGNOSIS — Z8616 Personal history of COVID-19: Secondary | ICD-10-CM | POA: Diagnosis not present

## 2022-02-02 DIAGNOSIS — R112 Nausea with vomiting, unspecified: Secondary | ICD-10-CM | POA: Diagnosis not present

## 2022-02-02 DIAGNOSIS — K219 Gastro-esophageal reflux disease without esophagitis: Secondary | ICD-10-CM | POA: Diagnosis not present

## 2022-02-02 DIAGNOSIS — K449 Diaphragmatic hernia without obstruction or gangrene: Secondary | ICD-10-CM | POA: Diagnosis not present

## 2022-02-02 DIAGNOSIS — K2270 Barrett's esophagus without dysplasia: Secondary | ICD-10-CM | POA: Diagnosis not present

## 2022-02-02 DIAGNOSIS — R627 Adult failure to thrive: Secondary | ICD-10-CM | POA: Diagnosis not present

## 2022-02-02 DIAGNOSIS — R634 Abnormal weight loss: Secondary | ICD-10-CM | POA: Diagnosis not present

## 2022-02-02 DIAGNOSIS — E43 Unspecified severe protein-calorie malnutrition: Secondary | ICD-10-CM | POA: Diagnosis not present

## 2022-02-02 DIAGNOSIS — R197 Diarrhea, unspecified: Secondary | ICD-10-CM | POA: Diagnosis not present

## 2022-02-02 DIAGNOSIS — R1084 Generalized abdominal pain: Secondary | ICD-10-CM | POA: Diagnosis not present

## 2022-02-02 DIAGNOSIS — Z9889 Other specified postprocedural states: Secondary | ICD-10-CM | POA: Diagnosis not present

## 2022-02-02 DIAGNOSIS — Z681 Body mass index (BMI) 19 or less, adult: Secondary | ICD-10-CM | POA: Diagnosis not present

## 2022-02-02 DIAGNOSIS — Z79899 Other long term (current) drug therapy: Secondary | ICD-10-CM | POA: Diagnosis not present

## 2022-02-02 DIAGNOSIS — J45909 Unspecified asthma, uncomplicated: Secondary | ICD-10-CM | POA: Diagnosis not present

## 2022-02-02 DIAGNOSIS — R000 Tachycardia, unspecified: Secondary | ICD-10-CM | POA: Diagnosis not present

## 2022-02-02 DIAGNOSIS — M6281 Muscle weakness (generalized): Secondary | ICD-10-CM | POA: Diagnosis not present

## 2022-02-02 DIAGNOSIS — I2510 Atherosclerotic heart disease of native coronary artery without angina pectoris: Secondary | ICD-10-CM | POA: Diagnosis not present

## 2022-02-02 DIAGNOSIS — I2584 Coronary atherosclerosis due to calcified coronary lesion: Secondary | ICD-10-CM | POA: Diagnosis not present

## 2022-02-02 DIAGNOSIS — Z20822 Contact with and (suspected) exposure to covid-19: Secondary | ICD-10-CM | POA: Diagnosis not present

## 2022-02-02 DIAGNOSIS — R531 Weakness: Secondary | ICD-10-CM | POA: Diagnosis not present

## 2022-02-09 ENCOUNTER — Emergency Department (HOSPITAL_COMMUNITY): Payer: Medicare Other

## 2022-02-09 ENCOUNTER — Emergency Department (HOSPITAL_COMMUNITY)
Admission: EM | Admit: 2022-02-09 | Discharge: 2022-02-10 | Payer: Medicare Other | Attending: Emergency Medicine | Admitting: Emergency Medicine

## 2022-02-09 ENCOUNTER — Other Ambulatory Visit: Payer: Self-pay

## 2022-02-09 ENCOUNTER — Encounter (HOSPITAL_COMMUNITY): Payer: Self-pay

## 2022-02-09 DIAGNOSIS — Z7982 Long term (current) use of aspirin: Secondary | ICD-10-CM | POA: Diagnosis not present

## 2022-02-09 DIAGNOSIS — J690 Pneumonitis due to inhalation of food and vomit: Secondary | ICD-10-CM | POA: Diagnosis not present

## 2022-02-09 DIAGNOSIS — I700 Atherosclerosis of aorta: Secondary | ICD-10-CM | POA: Diagnosis not present

## 2022-02-09 DIAGNOSIS — R079 Chest pain, unspecified: Secondary | ICD-10-CM | POA: Diagnosis not present

## 2022-02-09 DIAGNOSIS — R1110 Vomiting, unspecified: Secondary | ICD-10-CM | POA: Diagnosis not present

## 2022-02-09 DIAGNOSIS — J181 Lobar pneumonia, unspecified organism: Secondary | ICD-10-CM | POA: Diagnosis not present

## 2022-02-09 DIAGNOSIS — A419 Sepsis, unspecified organism: Secondary | ICD-10-CM | POA: Insufficient documentation

## 2022-02-09 DIAGNOSIS — R112 Nausea with vomiting, unspecified: Secondary | ICD-10-CM | POA: Diagnosis not present

## 2022-02-09 DIAGNOSIS — R109 Unspecified abdominal pain: Secondary | ICD-10-CM | POA: Diagnosis not present

## 2022-02-09 DIAGNOSIS — R110 Nausea: Secondary | ICD-10-CM | POA: Diagnosis not present

## 2022-02-09 DIAGNOSIS — J90 Pleural effusion, not elsewhere classified: Secondary | ICD-10-CM | POA: Insufficient documentation

## 2022-02-09 DIAGNOSIS — R000 Tachycardia, unspecified: Secondary | ICD-10-CM | POA: Insufficient documentation

## 2022-02-09 DIAGNOSIS — J189 Pneumonia, unspecified organism: Secondary | ICD-10-CM | POA: Diagnosis not present

## 2022-02-09 DIAGNOSIS — R1111 Vomiting without nausea: Secondary | ICD-10-CM | POA: Diagnosis not present

## 2022-02-09 LAB — BASIC METABOLIC PANEL
Anion gap: 8 (ref 5–15)
BUN: 11 mg/dL (ref 6–20)
CO2: 25 mmol/L (ref 22–32)
Calcium: 8.7 mg/dL — ABNORMAL LOW (ref 8.9–10.3)
Chloride: 102 mmol/L (ref 98–111)
Creatinine, Ser: 0.47 mg/dL — ABNORMAL LOW (ref 0.61–1.24)
GFR, Estimated: >60 mL/min (ref >60)
Glucose, Bld: 107 mg/dL — ABNORMAL HIGH (ref 70–99)
Potassium: 3.6 mmol/L (ref 3.5–5.1)
Sodium: 135 mmol/L (ref 135–145)

## 2022-02-09 LAB — CBC
HCT: 39.1 % (ref 39.0–52.0)
Hemoglobin: 13.3 g/dL (ref 13.0–17.0)
MCH: 30.4 pg (ref 26.0–34.0)
MCHC: 34 g/dL (ref 30.0–36.0)
MCV: 89.3 fL (ref 80.0–100.0)
Platelets: 279 10*3/uL (ref 150–400)
RBC: 4.38 MIL/uL (ref 4.22–5.81)
RDW: 13.2 % (ref 11.5–15.5)
WBC: 17.4 10*3/uL — ABNORMAL HIGH (ref 4.0–10.5)
nRBC: 0 % (ref 0.0–0.2)

## 2022-02-09 LAB — LACTIC ACID, PLASMA: Lactic Acid, Venous: 0.7 mmol/L (ref 0.5–1.9)

## 2022-02-09 LAB — TROPONIN I (HIGH SENSITIVITY): Troponin I (High Sensitivity): 18 ng/L — ABNORMAL HIGH (ref <18)

## 2022-02-09 MED ORDER — SODIUM CHLORIDE 0.9 % IV SOLN
12.5000 mg | Freq: Once | INTRAVENOUS | Status: DC
  Filled 2022-02-09: qty 0.5

## 2022-02-09 MED ORDER — SODIUM CHLORIDE 0.9 % IV SOLN
12.5000 mg | Freq: Four times a day (QID) | INTRAVENOUS | Status: DC | PRN
  Administered 2022-02-09: 12.5 mg via INTRAVENOUS
  Filled 2022-02-09: qty 0.5
  Filled 2022-02-09: qty 12.5

## 2022-02-09 MED ORDER — VANCOMYCIN HCL 1250 MG/250ML IV SOLN
1250.0000 mg | Freq: Once | INTRAVENOUS | Status: AC
  Administered 2022-02-09: 1250 mg via INTRAVENOUS
  Filled 2022-02-09: qty 250

## 2022-02-09 MED ORDER — SODIUM CHLORIDE 0.9 % IV BOLUS: 1000.0000 mL | Freq: Once | INTRAVENOUS | Status: DC

## 2022-02-09 MED ORDER — HYDROMORPHONE HCL 1 MG/ML IJ SOLN
1.0000 mg | Freq: Once | INTRAMUSCULAR | Status: AC
  Administered 2022-02-09: 1 mg via INTRAVENOUS
  Filled 2022-02-09: qty 1

## 2022-02-09 MED ORDER — LACTATED RINGERS IV SOLN: INTRAVENOUS | Status: DC

## 2022-02-09 MED ORDER — LACTATED RINGERS IV BOLUS (SEPSIS)
1000.0000 mL | Freq: Once | INTRAVENOUS | Status: AC
  Administered 2022-02-09: 1000 mL via INTRAVENOUS

## 2022-02-09 MED ORDER — FENTANYL CITRATE PF 50 MCG/ML IJ SOSY
50.0000 ug | PREFILLED_SYRINGE | Freq: Once | INTRAMUSCULAR | Status: AC
  Administered 2022-02-09: 50 ug via INTRAVENOUS
  Filled 2022-02-09: qty 1

## 2022-02-09 MED ORDER — SODIUM CHLORIDE 0.9 % IV SOLN
2.0000 g | Freq: Once | INTRAVENOUS | Status: AC
  Administered 2022-02-09: 2 g via INTRAVENOUS
  Filled 2022-02-09: qty 2

## 2022-02-09 MED ORDER — VANCOMYCIN HCL IN DEXTROSE 1-5 GM/200ML-% IV SOLN: 1000.0000 mg | Freq: Once | INTRAVENOUS | Status: DC

## 2022-02-09 MED ORDER — PROMETHAZINE HCL 25 MG/ML IJ SOLN
12.5000 mg | Freq: Once | INTRAMUSCULAR | Status: AC
  Administered 2022-02-09: 12.5 mg via INTRAMUSCULAR
  Filled 2022-02-09: qty 1

## 2022-02-09 MED ORDER — SODIUM CHLORIDE 0.9 % IV SOLN
12.5000 mg | INTRAVENOUS | Status: DC | PRN
  Administered 2022-02-09: 12.5 mg via INTRAVENOUS
  Filled 2022-02-09: qty 0.5
  Filled 2022-02-09: qty 12.5
  Filled 2022-02-09: qty 0.5

## 2022-02-09 MED ORDER — HYDROMORPHONE HCL 1 MG/ML IJ SOLN
1.0000 mg | INTRAMUSCULAR | Status: DC | PRN
  Administered 2022-02-09: 1 mg via INTRAVENOUS
  Filled 2022-02-09: qty 1

## 2022-02-09 NOTE — Progress Notes (Signed)
A consult was received from an ED provider for Vancomycin and Cefepime per pharmacy dosing.  The patient's profile has been reviewed for ht/wt/allergies/indication/available labs.   ? ?A one time order has been placed for Vancomycin 1250mg  IV and Cefepime 2g IV.  Further antibiotics/pharmacy consults should be ordered by admitting physician if indicated.       ?                ?Thank you, ? ?02/09/2022  5:09 PM ? ?

## 2022-02-09 NOTE — ED Notes (Signed)
Patient updated on plan of care

## 2022-02-09 NOTE — ED Triage Notes (Incomplete Revision)
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

## 2022-02-09 NOTE — Progress Notes (Signed)
Elink following code sepsis °

## 2022-02-09 NOTE — ED Notes (Signed)
Patient provided with urinal per request 

## 2022-02-09 NOTE — ED Notes (Signed)
Patient reports improvement in nausea.

## 2022-02-09 NOTE — ED Notes (Signed)
Patient made aware of plan to transfer.  All questions answered. ?

## 2022-02-09 NOTE — ED Notes (Signed)
Patient complaining of nausea.  States "it is driving me crazy."  Informed patient that additional medication is not due at this time ?

## 2022-02-09 NOTE — Progress Notes (Signed)
Notified provider of need to order lactic acid and repeat lactic acid.  

## 2022-02-09 NOTE — ED Triage Notes (Signed)
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

## 2022-02-09 NOTE — ED Triage Notes (Signed)
Patient added during triage that he has also been having chest pain. ?

## 2022-02-09 NOTE — ED Notes (Signed)
Port flushed w/ normal saline prior to hanging LR.  ?

## 2022-02-09 NOTE — ED Notes (Addendum)
Pt refused antibiotics unless Phenergan was given.  Phenergan not compatible w/ several ordered medications including LR.  ? ?Unable to find a peripheral vein to obtain blood cultures.  Blood cultured pulled from chest port.  Kurin PIV device used to obtain.   ?

## 2022-02-09 NOTE — ED Notes (Addendum)
Hospital transfer complete per PA order. Information as follows: ? ?Transfer request sent/accepted to [facility]- Ms. Amy@ [phone] ?Accepting provider: Dr. Allayne Gitelman, Cardiothoracic Surg ?Pt room assignment: [address], [place] [address] ?[facility] Nursing report contact number: [phone], Option 2 ?Transportation request completed- [facility]- Ms. Tara@ [phone] ? ?RN and PA advised. ENMiles ?

## 2022-02-09 NOTE — ED Provider Notes (Addendum)
?[place] DEPT ?Provider Note ? ? ?CSN: [id number] ?Arrival date & time: 02/09/22  1348 ? ?  ? ?History ? ?Chief Complaint  ?Patient presents with  ? Chest Pain  ? Emesis  ? ? ?Edwin Hall is a 56 y.o. male who presents the emergency department complaining of abdominal pain, chest pain, nausea and vomiting.  Patient had an esophageal resection back in January, and was recently admitted to [facility] for recurrent pleural effusions/aspiration and met sepsis criteria.  He was just discharged, and then started having severe vomiting once he got home.  He states he is not tolerating liquids.  ? ? ?Chest Pain ?Associated symptoms: abdominal pain, nausea, shortness of breath and vomiting   ?Emesis ?Associated symptoms: abdominal pain   ?Associated symptoms: no chills   ? ?  ? ?Home Medications ?Prior to Admission medications   ?Medication Sig Start Date End Date Taking? Authorizing Provider  ?acetaminophen (TYLENOL) 500 MG tablet Take 1,000 mg by mouth every 6 (six) hours as needed for moderate pain.   Yes [provider]  ?aspirin 81 MG EC tablet Take 81 mg by mouth daily. Swallow whole.   Yes [provider]  ?Cholecalciferol (VITAMIN D3 PO) Take 250 mcg by mouth daily.    Yes [provider]  ?Cyanocobalamin (VITAMIN B 12 PO) Take 1 capsule by mouth daily.   Yes [provider]  ?Ferrous Sulfate (IRON) 325 (65 Fe) MG TABS Take 325 mg by mouth every other day.  05/12/20  Yes [provider]  ?gabapentin (NEURONTIN) 300 MG capsule Take 300 mg by mouth daily.  03/29/20  Yes [provider]  ?Ginkgo Biloba 40 MG TABS Take 40 mg by mouth daily.    Yes [provider]  ?ipratropium (ATROVENT) 0.06 % nasal spray USE TWO SPRAYS IN EACH NOSTRIL TWICE DAILY AS DIRECTED. ?Patient taking differently: Place 1 spray into both nostrils daily as needed for rhinitis. 05/22/20  Yes Padgett, Rae Halsted, MD  ?Rolan Lipa 290 MCG CAPS capsule Take 290  mcg by mouth daily as needed (abdominal cramping).  07/03/20  Yes [provider]  ?mirtazapine (REMERON) 15 MG tablet Take 15 mg by mouth at bedtime. 02/08/20  Yes [provider]  ?montelukast (SINGULAIR) 10 MG tablet Take 10 mg by mouth daily as needed (allergies). 12/11/21  Yes [provider]  ?Multiple Vitamin (MULTIVITAMIN WITH MINERALS) TABS tablet Take 1 tablet by mouth daily.   Yes [provider]  ?pantoprazole (PROTONIX) 40 MG tablet Take 40 mg by mouth daily.   Yes [provider]  ?PROAIR DIGIHALER 108 (90 Base) MCG/ACT AEPB Take 2 puffs by mouth every 4 (four) hours as needed (sob/wheezing). 09/22/21  Yes [provider]  ?promethazine (PHENERGAN) 25 MG tablet Take 25 mg by mouth every 6 (six) hours as needed for nausea or vomiting.   Yes [provider]  ?rosuvastatin (CRESTOR) 20 MG tablet Take 20 mg by mouth daily.   Yes [provider]  ?Zinc 50 MG TABS Take 50 mg by mouth daily.    Yes [provider]  ?dicyclomine (BENTYL) 20 MG tablet Take 1 tablet (20 mg total) by mouth 2 (two) times daily. ?Patient not taking: Reported on 02/09/2022 07/03/21   Jacqlyn Larsen, PA-C  ?HYDROcodone-homatropine [facility]) 5-1.5 MG/5ML syrup Take 5 mLs by mouth every 6 (six) hours as needed for cough. ?Patient not taking: Reported on 02/09/2022 03/10/21   Drenda Freeze, MD  ?oxyCODONE (OXY IR/ROXICODONE) 5  MG immediate release tablet Take 1 tablet (5 mg total) by mouth every 6 (six) hours as needed for up to 10 doses for severe pain. ?Patient not taking: Reported on 02/09/2022 05/20/20   Antonieta Pert, MD  ?pantoprazole (PROTONIX) 40 MG tablet Take 1 tablet (40 mg total) by mouth 2 (two) times daily. 05/20/20 10/17/20  Antonieta Pert, MD  ?promethazine (PHENERGAN) 25 MG tablet Take 1 tablet (25 mg total) by mouth every 6 (six) hours as needed for up to 7 days for nausea or vomiting. ?Patient taking differently: Take 25 mg by mouth every 6 (six) hours  as needed for nausea or vomiting. Nausea 05/20/20 10/17/20  Antonieta Pert, MD  ?sucralfate (CARAFATE) 1 g tablet Take 1 tablet (1 g total) by mouth 4 (four) times daily -  with meals and at bedtime. ?Patient not taking: Reported on 02/09/2022 10/17/20   Quintella Reichert, MD  ?azelastine (ASTELIN) 0.1 % nasal spray 2 sprays per nostril twice a day for control of nasal drainage. ?Patient not taking: Reported on 05/02/2020 11/27/19 05/02/20  Kennith Gain, MD  ?   ? ?Allergies    ?Contrast media [iodinated contrast media], Other, Shellfish allergy, Reglan [metoclopramide], Iodine, Tetracyclines & related, and Zofran [ondansetron hcl]   ? ?Review of Systems   ?Review of Systems  ?Constitutional:  Negative for chills.  ?Respiratory:  Positive for shortness of breath.   ?Cardiovascular:  Positive for chest pain.  ?Gastrointestinal:  Positive for abdominal pain, nausea and vomiting.  ?All other systems reviewed and are negative. ? ?Physical Exam ?Updated Vital Signs ?BP (!) 114/93   Pulse 89   Temp 99.2 ?F (37.3 ?C) (Oral)   Resp 14   Ht _0  (1.727 m)   Wt 58.1 kg   SpO2 95%   BMI 19.46 kg/m?  ?Physical Exam ?Vitals and nursing note reviewed.  ?Constitutional:   ?   Appearance: He is cachectic. He is ill-appearing.  ?HENT:  ?   Head: Normocephalic and atraumatic.  ?Eyes:  ?   Conjunctiva/sclera: Conjunctivae normal.  ?Cardiovascular:  ?   Rate and Rhythm: Regular rhythm. Tachycardia present.  ?Pulmonary:  ?   Effort: Pulmonary effort is normal. No respiratory distress.  ?   Breath sounds: Normal breath sounds.  ?Abdominal:  ?   General: There is no distension.  ?   Palpations: Abdomen is soft.  ?   Tenderness: There is no abdominal tenderness.  ?Skin: ?   General: Skin is warm and dry.  ?Neurological:  ?   General: No focal deficit present.  ?   Mental Status: He is alert.  ? ? ?ED Results / Procedures / Treatments   ?Labs ?(all labs ordered are listed, but only abnormal results are displayed) ?Labs Reviewed   ?BASIC METABOLIC PANEL - Abnormal; Notable for the following components:  ?    Result Value  ? Glucose, Bld 107 (*)   ? Creatinine, Ser 0.47 (*)   ? Calcium 8.7 (*)   ? All other components within normal limits  ?CBC - Abnormal; Notable for the following components:  ? WBC 17.4 (*)   ? All other components within normal limits  ?TROPONIN I (HIGH SENSITIVITY) - Abnormal; Notable for the following components:  ? Troponin I (High Sensitivity) 18 (*)   ? All other components within normal limits  ?CULTURE, BLOOD (SINGLE)  ?LACTIC ACID, PLASMA  ? ? ?EKG ?None ? ?Radiology ?CT ABDOMEN PELVIS WO CONTRAST ? ?Result Date: 02/09/2022 ?CLINICAL DATA:  Abdominal pain,  acute, nonlocalized. Nausea and vomiting for the past 24 hours. Status post partial removal of esophagus in January. EXAM: CT ABDOMEN AND PELVIS WITHOUT CONTRAST TECHNIQUE: Multidetector CT imaging of the abdomen and pelvis was performed following the standard protocol without IV contrast. RADIATION DOSE REDUCTION: This exam was performed according to the departmental dose-optimization program which includes automated exposure control, adjustment of the mA and/or kV according to patient size and/or use of iterative reconstruction technique. COMPARISON:  07/03/2021. FINDINGS: Lower chest: Coronary artery calcifications are noted and there is a trace pericardial effusion. There is a trace right pleural effusion with patchy right lower lobe infiltrate. Are stable bronchiectasis and surgical changes in the left lower lobe. Postsurgical changes are present at the stomach in the posterior mediastinum. Hepatobiliary: No focal liver abnormality is seen. Status post cholecystectomy. No biliary dilatation. Pancreas: Unremarkable. No pancreatic ductal dilatation or surrounding inflammatory changes. Spleen: Normal in size without focal abnormality. Adrenals/Urinary Tract: Adrenal glands are unremarkable. Kidneys are normal, without renal calculi, focal lesion, or  hydronephrosis. Bladder is unremarkable. Stomach/Bowel: Postsurgical changes are noted at the stomach in the posterior mediastinum and upper abdomen. No bowel obstruction, free air, or pneumatosis. A presumed jejunostomy tube

## 2022-02-09 NOTE — ED Notes (Signed)
Provided Pt's sister, Thermon Leyland [phone], with an update.  Sister has major concerns regarding Pt not being able to take care of himself at home.  Sts she thinks he needs to be in a rehab for an extended amount of time.  He has been in a rehab, but the sister feels he was not cared for appropriately.  Pt was just discharged from [facility] yesterday.  ?

## 2022-02-10 DIAGNOSIS — G479 Sleep disorder, unspecified: Secondary | ICD-10-CM | POA: Diagnosis not present

## 2022-02-10 DIAGNOSIS — R531 Weakness: Secondary | ICD-10-CM | POA: Diagnosis not present

## 2022-02-10 DIAGNOSIS — F419 Anxiety disorder, unspecified: Secondary | ICD-10-CM | POA: Diagnosis not present

## 2022-02-10 DIAGNOSIS — J45909 Unspecified asthma, uncomplicated: Secondary | ICD-10-CM | POA: Diagnosis not present

## 2022-02-10 DIAGNOSIS — G8929 Other chronic pain: Secondary | ICD-10-CM | POA: Diagnosis not present

## 2022-02-10 DIAGNOSIS — E7800 Pure hypercholesterolemia, unspecified: Secondary | ICD-10-CM | POA: Diagnosis not present

## 2022-02-10 DIAGNOSIS — M6281 Muscle weakness (generalized): Secondary | ICD-10-CM | POA: Diagnosis not present

## 2022-02-10 DIAGNOSIS — J181 Lobar pneumonia, unspecified organism: Secondary | ICD-10-CM | POA: Diagnosis not present

## 2022-02-10 DIAGNOSIS — J918 Pleural effusion in other conditions classified elsewhere: Secondary | ICD-10-CM | POA: Diagnosis not present

## 2022-02-10 DIAGNOSIS — Z9889 Other specified postprocedural states: Secondary | ICD-10-CM | POA: Diagnosis not present

## 2022-02-10 DIAGNOSIS — F4310 Post-traumatic stress disorder, unspecified: Secondary | ICD-10-CM | POA: Diagnosis not present

## 2022-02-10 DIAGNOSIS — Z8709 Personal history of other diseases of the respiratory system: Secondary | ICD-10-CM | POA: Diagnosis not present

## 2022-02-10 DIAGNOSIS — R112 Nausea with vomiting, unspecified: Secondary | ICD-10-CM | POA: Diagnosis not present

## 2022-02-10 DIAGNOSIS — E785 Hyperlipidemia, unspecified: Secondary | ICD-10-CM | POA: Diagnosis not present

## 2022-02-10 DIAGNOSIS — R279 Unspecified lack of coordination: Secondary | ICD-10-CM | POA: Diagnosis not present

## 2022-02-10 DIAGNOSIS — Z9049 Acquired absence of other specified parts of digestive tract: Secondary | ICD-10-CM | POA: Diagnosis not present

## 2022-02-10 DIAGNOSIS — Z515 Encounter for palliative care: Secondary | ICD-10-CM | POA: Diagnosis not present

## 2022-02-10 DIAGNOSIS — R278 Other lack of coordination: Secondary | ICD-10-CM | POA: Diagnosis not present

## 2022-02-10 DIAGNOSIS — Z8616 Personal history of COVID-19: Secondary | ICD-10-CM | POA: Diagnosis not present

## 2022-02-10 DIAGNOSIS — R197 Diarrhea, unspecified: Secondary | ICD-10-CM | POA: Diagnosis not present

## 2022-02-10 DIAGNOSIS — E43 Unspecified severe protein-calorie malnutrition: Secondary | ICD-10-CM | POA: Diagnosis not present

## 2022-02-10 DIAGNOSIS — Z79891 Long term (current) use of opiate analgesic: Secondary | ICD-10-CM | POA: Diagnosis not present

## 2022-02-10 DIAGNOSIS — Z5986 Financial insecurity: Secondary | ICD-10-CM | POA: Diagnosis not present

## 2022-02-10 DIAGNOSIS — Z934 Other artificial openings of gastrointestinal tract status: Secondary | ICD-10-CM | POA: Diagnosis not present

## 2022-02-10 DIAGNOSIS — R410 Disorientation, unspecified: Secondary | ICD-10-CM | POA: Diagnosis not present

## 2022-02-10 DIAGNOSIS — Z434 Encounter for attention to other artificial openings of digestive tract: Secondary | ICD-10-CM | POA: Diagnosis not present

## 2022-02-10 DIAGNOSIS — Z7982 Long term (current) use of aspirin: Secondary | ICD-10-CM | POA: Diagnosis not present

## 2022-02-10 DIAGNOSIS — Z5941 Food insecurity: Secondary | ICD-10-CM | POA: Diagnosis not present

## 2022-02-10 DIAGNOSIS — K449 Diaphragmatic hernia without obstruction or gangrene: Secondary | ICD-10-CM | POA: Diagnosis not present

## 2022-02-10 DIAGNOSIS — I2510 Atherosclerotic heart disease of native coronary artery without angina pectoris: Secondary | ICD-10-CM | POA: Diagnosis not present

## 2022-02-10 DIAGNOSIS — Z931 Gastrostomy status: Secondary | ICD-10-CM | POA: Diagnosis not present

## 2022-02-10 DIAGNOSIS — A419 Sepsis, unspecified organism: Secondary | ICD-10-CM | POA: Diagnosis not present

## 2022-02-10 DIAGNOSIS — Z20822 Contact with and (suspected) exposure to covid-19: Secondary | ICD-10-CM | POA: Diagnosis not present

## 2022-02-10 DIAGNOSIS — R5381 Other malaise: Secondary | ICD-10-CM | POA: Diagnosis not present

## 2022-02-10 DIAGNOSIS — Z681 Body mass index (BMI) 19 or less, adult: Secondary | ICD-10-CM | POA: Diagnosis not present

## 2022-02-10 DIAGNOSIS — J90 Pleural effusion, not elsewhere classified: Secondary | ICD-10-CM | POA: Diagnosis not present

## 2022-02-10 DIAGNOSIS — K2270 Barrett's esophagus without dysplasia: Secondary | ICD-10-CM | POA: Diagnosis not present

## 2022-02-10 DIAGNOSIS — K219 Gastro-esophageal reflux disease without esophagitis: Secondary | ICD-10-CM | POA: Diagnosis not present

## 2022-02-10 DIAGNOSIS — Z5982 Transportation insecurity: Secondary | ICD-10-CM | POA: Diagnosis not present

## 2022-02-10 DIAGNOSIS — Z602 Problems related to living alone: Secondary | ICD-10-CM | POA: Diagnosis not present

## 2022-02-10 DIAGNOSIS — R109 Unspecified abdominal pain: Secondary | ICD-10-CM | POA: Diagnosis not present

## 2022-02-10 DIAGNOSIS — R000 Tachycardia, unspecified: Secondary | ICD-10-CM | POA: Diagnosis not present

## 2022-02-10 NOTE — ED Notes (Signed)
Report given to [facility].  All questions answered.  °

## 2022-02-10 NOTE — ED Notes (Signed)
Patient voiced understanding of transport to [facility].  Patient in agreement and verbal consent provided.  ?

## 2022-02-10 NOTE — ED Notes (Signed)
Patient transported to [facility] at this time via [facility].  [facility] provided with next dose of Phenergan to hang during transport.  ?

## 2022-02-11 DIAGNOSIS — Z9889 Other specified postprocedural states: Secondary | ICD-10-CM | POA: Diagnosis not present

## 2022-02-11 DIAGNOSIS — R112 Nausea with vomiting, unspecified: Secondary | ICD-10-CM | POA: Diagnosis not present

## 2022-02-11 DIAGNOSIS — Z9049 Acquired absence of other specified parts of digestive tract: Secondary | ICD-10-CM | POA: Diagnosis not present

## 2022-02-11 DIAGNOSIS — R197 Diarrhea, unspecified: Secondary | ICD-10-CM | POA: Diagnosis not present

## 2022-02-11 DIAGNOSIS — R109 Unspecified abdominal pain: Secondary | ICD-10-CM | POA: Diagnosis not present

## 2022-02-12 DIAGNOSIS — R112 Nausea with vomiting, unspecified: Secondary | ICD-10-CM | POA: Diagnosis not present

## 2022-02-12 DIAGNOSIS — R109 Unspecified abdominal pain: Secondary | ICD-10-CM | POA: Diagnosis not present

## 2022-02-12 DIAGNOSIS — Z9049 Acquired absence of other specified parts of digestive tract: Secondary | ICD-10-CM | POA: Diagnosis not present

## 2022-02-12 DIAGNOSIS — Z9889 Other specified postprocedural states: Secondary | ICD-10-CM | POA: Diagnosis not present

## 2022-02-12 DIAGNOSIS — R197 Diarrhea, unspecified: Secondary | ICD-10-CM | POA: Diagnosis not present

## 2022-02-13 DIAGNOSIS — R197 Diarrhea, unspecified: Secondary | ICD-10-CM | POA: Diagnosis not present

## 2022-02-13 DIAGNOSIS — R109 Unspecified abdominal pain: Secondary | ICD-10-CM | POA: Diagnosis not present

## 2022-02-13 DIAGNOSIS — Z9049 Acquired absence of other specified parts of digestive tract: Secondary | ICD-10-CM | POA: Diagnosis not present

## 2022-02-13 DIAGNOSIS — R112 Nausea with vomiting, unspecified: Secondary | ICD-10-CM | POA: Diagnosis not present

## 2022-02-13 DIAGNOSIS — Z9889 Other specified postprocedural states: Secondary | ICD-10-CM | POA: Diagnosis not present

## 2022-02-14 DIAGNOSIS — R197 Diarrhea, unspecified: Secondary | ICD-10-CM | POA: Diagnosis not present

## 2022-02-14 DIAGNOSIS — R112 Nausea with vomiting, unspecified: Secondary | ICD-10-CM | POA: Diagnosis not present

## 2022-02-14 DIAGNOSIS — Z9049 Acquired absence of other specified parts of digestive tract: Secondary | ICD-10-CM | POA: Diagnosis not present

## 2022-02-14 DIAGNOSIS — Z9889 Other specified postprocedural states: Secondary | ICD-10-CM | POA: Diagnosis not present

## 2022-02-14 DIAGNOSIS — R109 Unspecified abdominal pain: Secondary | ICD-10-CM | POA: Diagnosis not present

## 2022-02-14 LAB — CULTURE, BLOOD (SINGLE)
Culture: NO GROWTH
Special Requests: ADEQUATE

## 2022-02-15 DIAGNOSIS — F419 Anxiety disorder, unspecified: Secondary | ICD-10-CM | POA: Diagnosis not present

## 2022-02-15 DIAGNOSIS — R109 Unspecified abdominal pain: Secondary | ICD-10-CM | POA: Diagnosis not present

## 2022-02-15 DIAGNOSIS — R112 Nausea with vomiting, unspecified: Secondary | ICD-10-CM | POA: Diagnosis not present

## 2022-02-15 DIAGNOSIS — G479 Sleep disorder, unspecified: Secondary | ICD-10-CM | POA: Diagnosis not present

## 2022-02-15 DIAGNOSIS — K219 Gastro-esophageal reflux disease without esophagitis: Secondary | ICD-10-CM | POA: Diagnosis not present

## 2022-02-16 DIAGNOSIS — Z9889 Other specified postprocedural states: Secondary | ICD-10-CM | POA: Diagnosis not present

## 2022-02-16 DIAGNOSIS — R197 Diarrhea, unspecified: Secondary | ICD-10-CM | POA: Diagnosis not present

## 2022-02-16 DIAGNOSIS — R112 Nausea with vomiting, unspecified: Secondary | ICD-10-CM | POA: Diagnosis not present

## 2022-02-16 DIAGNOSIS — R109 Unspecified abdominal pain: Secondary | ICD-10-CM | POA: Diagnosis not present

## 2022-02-16 DIAGNOSIS — Z9049 Acquired absence of other specified parts of digestive tract: Secondary | ICD-10-CM | POA: Diagnosis not present

## 2022-02-17 DIAGNOSIS — R197 Diarrhea, unspecified: Secondary | ICD-10-CM | POA: Diagnosis not present

## 2022-02-17 DIAGNOSIS — Z9889 Other specified postprocedural states: Secondary | ICD-10-CM | POA: Diagnosis not present

## 2022-02-17 DIAGNOSIS — R109 Unspecified abdominal pain: Secondary | ICD-10-CM | POA: Diagnosis not present

## 2022-02-17 DIAGNOSIS — R112 Nausea with vomiting, unspecified: Secondary | ICD-10-CM | POA: Diagnosis not present

## 2022-02-23 DIAGNOSIS — R109 Unspecified abdominal pain: Secondary | ICD-10-CM | POA: Diagnosis not present

## 2022-02-24 DIAGNOSIS — E785 Hyperlipidemia, unspecified: Secondary | ICD-10-CM | POA: Diagnosis not present

## 2022-02-24 DIAGNOSIS — K219 Gastro-esophageal reflux disease without esophagitis: Secondary | ICD-10-CM | POA: Diagnosis not present

## 2022-02-24 DIAGNOSIS — Z8616 Personal history of COVID-19: Secondary | ICD-10-CM | POA: Diagnosis not present

## 2022-02-24 DIAGNOSIS — M6281 Muscle weakness (generalized): Secondary | ICD-10-CM | POA: Diagnosis not present

## 2022-02-24 DIAGNOSIS — J181 Lobar pneumonia, unspecified organism: Secondary | ICD-10-CM | POA: Diagnosis not present

## 2022-02-24 DIAGNOSIS — R0989 Other specified symptoms and signs involving the circulatory and respiratory systems: Secondary | ICD-10-CM | POA: Diagnosis not present

## 2022-02-24 DIAGNOSIS — Z9049 Acquired absence of other specified parts of digestive tract: Secondary | ICD-10-CM | POA: Diagnosis not present

## 2022-02-24 DIAGNOSIS — R112 Nausea with vomiting, unspecified: Secondary | ICD-10-CM | POA: Diagnosis not present

## 2022-02-24 DIAGNOSIS — R279 Unspecified lack of coordination: Secondary | ICD-10-CM | POA: Diagnosis not present

## 2022-02-24 DIAGNOSIS — Z8709 Personal history of other diseases of the respiratory system: Secondary | ICD-10-CM | POA: Diagnosis not present

## 2022-02-24 DIAGNOSIS — F419 Anxiety disorder, unspecified: Secondary | ICD-10-CM | POA: Diagnosis not present

## 2022-02-24 DIAGNOSIS — I2510 Atherosclerotic heart disease of native coronary artery without angina pectoris: Secondary | ICD-10-CM | POA: Diagnosis not present

## 2022-02-24 DIAGNOSIS — K449 Diaphragmatic hernia without obstruction or gangrene: Secondary | ICD-10-CM | POA: Diagnosis not present

## 2022-02-24 DIAGNOSIS — K277 Chronic peptic ulcer, site unspecified, without hemorrhage or perforation: Secondary | ICD-10-CM | POA: Diagnosis not present

## 2022-02-24 DIAGNOSIS — Z934 Other artificial openings of gastrointestinal tract status: Secondary | ICD-10-CM | POA: Diagnosis not present

## 2022-02-24 DIAGNOSIS — K2270 Barrett's esophagus without dysplasia: Secondary | ICD-10-CM | POA: Diagnosis not present

## 2022-02-24 DIAGNOSIS — J189 Pneumonia, unspecified organism: Secondary | ICD-10-CM | POA: Diagnosis not present

## 2022-02-24 DIAGNOSIS — R5381 Other malaise: Secondary | ICD-10-CM | POA: Diagnosis not present

## 2022-02-24 DIAGNOSIS — R079 Chest pain, unspecified: Secondary | ICD-10-CM | POA: Diagnosis not present

## 2022-02-24 DIAGNOSIS — J918 Pleural effusion in other conditions classified elsewhere: Secondary | ICD-10-CM | POA: Diagnosis not present

## 2022-02-24 DIAGNOSIS — I240 Acute coronary thrombosis not resulting in myocardial infarction: Secondary | ICD-10-CM | POA: Diagnosis not present

## 2022-02-24 DIAGNOSIS — R278 Other lack of coordination: Secondary | ICD-10-CM | POA: Diagnosis not present

## 2022-02-24 DIAGNOSIS — Z434 Encounter for attention to other artificial openings of digestive tract: Secondary | ICD-10-CM | POA: Diagnosis not present

## 2022-02-24 DIAGNOSIS — J45909 Unspecified asthma, uncomplicated: Secondary | ICD-10-CM | POA: Diagnosis not present

## 2022-02-26 DIAGNOSIS — Z434 Encounter for attention to other artificial openings of digestive tract: Secondary | ICD-10-CM | POA: Diagnosis not present

## 2022-02-26 DIAGNOSIS — M6281 Muscle weakness (generalized): Secondary | ICD-10-CM | POA: Diagnosis not present

## 2022-02-26 DIAGNOSIS — I240 Acute coronary thrombosis not resulting in myocardial infarction: Secondary | ICD-10-CM | POA: Diagnosis not present

## 2022-02-26 DIAGNOSIS — J181 Lobar pneumonia, unspecified organism: Secondary | ICD-10-CM | POA: Diagnosis not present

## 2022-02-26 DIAGNOSIS — K277 Chronic peptic ulcer, site unspecified, without hemorrhage or perforation: Secondary | ICD-10-CM | POA: Diagnosis not present

## 2022-03-15 DIAGNOSIS — J189 Pneumonia, unspecified organism: Secondary | ICD-10-CM | POA: Diagnosis not present

## 2022-03-15 DIAGNOSIS — K277 Chronic peptic ulcer, site unspecified, without hemorrhage or perforation: Secondary | ICD-10-CM | POA: Diagnosis not present

## 2022-03-15 DIAGNOSIS — I240 Acute coronary thrombosis not resulting in myocardial infarction: Secondary | ICD-10-CM | POA: Diagnosis not present

## 2022-03-19 DIAGNOSIS — Z8616 Personal history of COVID-19: Secondary | ICD-10-CM | POA: Diagnosis not present

## 2022-03-19 DIAGNOSIS — Z8701 Personal history of pneumonia (recurrent): Secondary | ICD-10-CM | POA: Diagnosis not present

## 2022-03-19 DIAGNOSIS — K219 Gastro-esophageal reflux disease without esophagitis: Secondary | ICD-10-CM | POA: Diagnosis not present

## 2022-03-19 DIAGNOSIS — Z48815 Encounter for surgical aftercare following surgery on the digestive system: Secondary | ICD-10-CM | POA: Diagnosis not present

## 2022-03-19 DIAGNOSIS — K449 Diaphragmatic hernia without obstruction or gangrene: Secondary | ICD-10-CM | POA: Diagnosis not present

## 2022-03-19 DIAGNOSIS — J45909 Unspecified asthma, uncomplicated: Secondary | ICD-10-CM | POA: Diagnosis not present

## 2022-03-19 DIAGNOSIS — J181 Lobar pneumonia, unspecified organism: Secondary | ICD-10-CM | POA: Diagnosis not present

## 2022-03-19 DIAGNOSIS — Z431 Encounter for attention to gastrostomy: Secondary | ICD-10-CM | POA: Diagnosis not present

## 2022-03-19 DIAGNOSIS — I2510 Atherosclerotic heart disease of native coronary artery without angina pectoris: Secondary | ICD-10-CM | POA: Diagnosis not present

## 2022-03-19 DIAGNOSIS — Z9049 Acquired absence of other specified parts of digestive tract: Secondary | ICD-10-CM | POA: Diagnosis not present

## 2022-03-19 DIAGNOSIS — Z7951 Long term (current) use of inhaled steroids: Secondary | ICD-10-CM | POA: Diagnosis not present

## 2022-03-19 DIAGNOSIS — E785 Hyperlipidemia, unspecified: Secondary | ICD-10-CM | POA: Diagnosis not present

## 2022-03-19 DIAGNOSIS — Z79891 Long term (current) use of opiate analgesic: Secondary | ICD-10-CM | POA: Diagnosis not present

## 2022-03-19 DIAGNOSIS — J918 Pleural effusion in other conditions classified elsewhere: Secondary | ICD-10-CM | POA: Diagnosis not present

## 2022-03-19 DIAGNOSIS — K2270 Barrett's esophagus without dysplasia: Secondary | ICD-10-CM | POA: Diagnosis not present

## 2022-03-22 DIAGNOSIS — Z48815 Encounter for surgical aftercare following surgery on the digestive system: Secondary | ICD-10-CM | POA: Diagnosis not present

## 2022-03-22 DIAGNOSIS — J918 Pleural effusion in other conditions classified elsewhere: Secondary | ICD-10-CM | POA: Diagnosis not present

## 2022-03-22 DIAGNOSIS — Z431 Encounter for attention to gastrostomy: Secondary | ICD-10-CM | POA: Diagnosis not present

## 2022-03-22 DIAGNOSIS — J181 Lobar pneumonia, unspecified organism: Secondary | ICD-10-CM | POA: Diagnosis not present

## 2022-03-22 DIAGNOSIS — J45909 Unspecified asthma, uncomplicated: Secondary | ICD-10-CM | POA: Diagnosis not present

## 2022-03-22 DIAGNOSIS — Z20822 Contact with and (suspected) exposure to covid-19: Secondary | ICD-10-CM | POA: Diagnosis not present

## 2022-03-22 DIAGNOSIS — K2270 Barrett's esophagus without dysplasia: Secondary | ICD-10-CM | POA: Diagnosis not present

## 2022-03-23 DIAGNOSIS — Z431 Encounter for attention to gastrostomy: Secondary | ICD-10-CM | POA: Diagnosis not present

## 2022-03-23 DIAGNOSIS — Z48815 Encounter for surgical aftercare following surgery on the digestive system: Secondary | ICD-10-CM | POA: Diagnosis not present

## 2022-03-23 DIAGNOSIS — J181 Lobar pneumonia, unspecified organism: Secondary | ICD-10-CM | POA: Diagnosis not present

## 2022-03-23 DIAGNOSIS — K2270 Barrett's esophagus without dysplasia: Secondary | ICD-10-CM | POA: Diagnosis not present

## 2022-03-23 DIAGNOSIS — J918 Pleural effusion in other conditions classified elsewhere: Secondary | ICD-10-CM | POA: Diagnosis not present

## 2022-03-23 DIAGNOSIS — J45909 Unspecified asthma, uncomplicated: Secondary | ICD-10-CM | POA: Diagnosis not present

## 2022-03-24 DIAGNOSIS — D649 Anemia, unspecified: Secondary | ICD-10-CM | POA: Diagnosis not present

## 2022-03-24 DIAGNOSIS — G8918 Other acute postprocedural pain: Secondary | ICD-10-CM | POA: Diagnosis not present

## 2022-03-24 DIAGNOSIS — Z9049 Acquired absence of other specified parts of digestive tract: Secondary | ICD-10-CM | POA: Diagnosis not present

## 2022-03-24 DIAGNOSIS — R110 Nausea: Secondary | ICD-10-CM | POA: Diagnosis not present

## 2022-03-24 DIAGNOSIS — Z9889 Other specified postprocedural states: Secondary | ICD-10-CM | POA: Diagnosis not present

## 2022-03-25 DIAGNOSIS — J181 Lobar pneumonia, unspecified organism: Secondary | ICD-10-CM | POA: Diagnosis not present

## 2022-03-25 DIAGNOSIS — Z431 Encounter for attention to gastrostomy: Secondary | ICD-10-CM | POA: Diagnosis not present

## 2022-03-25 DIAGNOSIS — Z48815 Encounter for surgical aftercare following surgery on the digestive system: Secondary | ICD-10-CM | POA: Diagnosis not present

## 2022-03-25 DIAGNOSIS — J918 Pleural effusion in other conditions classified elsewhere: Secondary | ICD-10-CM | POA: Diagnosis not present

## 2022-03-25 DIAGNOSIS — K2270 Barrett's esophagus without dysplasia: Secondary | ICD-10-CM | POA: Diagnosis not present

## 2022-03-25 DIAGNOSIS — J45909 Unspecified asthma, uncomplicated: Secondary | ICD-10-CM | POA: Diagnosis not present

## 2022-03-29 DIAGNOSIS — J918 Pleural effusion in other conditions classified elsewhere: Secondary | ICD-10-CM | POA: Diagnosis not present

## 2022-03-29 DIAGNOSIS — K2270 Barrett's esophagus without dysplasia: Secondary | ICD-10-CM | POA: Diagnosis not present

## 2022-03-29 DIAGNOSIS — Z431 Encounter for attention to gastrostomy: Secondary | ICD-10-CM | POA: Diagnosis not present

## 2022-03-29 DIAGNOSIS — J181 Lobar pneumonia, unspecified organism: Secondary | ICD-10-CM | POA: Diagnosis not present

## 2022-03-29 DIAGNOSIS — Z48815 Encounter for surgical aftercare following surgery on the digestive system: Secondary | ICD-10-CM | POA: Diagnosis not present

## 2022-03-29 DIAGNOSIS — J45909 Unspecified asthma, uncomplicated: Secondary | ICD-10-CM | POA: Diagnosis not present

## 2022-04-02 DIAGNOSIS — Z48815 Encounter for surgical aftercare following surgery on the digestive system: Secondary | ICD-10-CM | POA: Diagnosis not present

## 2022-04-02 DIAGNOSIS — Z431 Encounter for attention to gastrostomy: Secondary | ICD-10-CM | POA: Diagnosis not present

## 2022-04-02 DIAGNOSIS — J45909 Unspecified asthma, uncomplicated: Secondary | ICD-10-CM | POA: Diagnosis not present

## 2022-04-02 DIAGNOSIS — J918 Pleural effusion in other conditions classified elsewhere: Secondary | ICD-10-CM | POA: Diagnosis not present

## 2022-04-02 DIAGNOSIS — K2270 Barrett's esophagus without dysplasia: Secondary | ICD-10-CM | POA: Diagnosis not present

## 2022-04-02 DIAGNOSIS — J181 Lobar pneumonia, unspecified organism: Secondary | ICD-10-CM | POA: Diagnosis not present

## 2022-04-05 DIAGNOSIS — J181 Lobar pneumonia, unspecified organism: Secondary | ICD-10-CM | POA: Diagnosis not present

## 2022-04-05 DIAGNOSIS — J45909 Unspecified asthma, uncomplicated: Secondary | ICD-10-CM | POA: Diagnosis not present

## 2022-04-05 DIAGNOSIS — Z431 Encounter for attention to gastrostomy: Secondary | ICD-10-CM | POA: Diagnosis not present

## 2022-04-05 DIAGNOSIS — Z48815 Encounter for surgical aftercare following surgery on the digestive system: Secondary | ICD-10-CM | POA: Diagnosis not present

## 2022-04-05 DIAGNOSIS — J918 Pleural effusion in other conditions classified elsewhere: Secondary | ICD-10-CM | POA: Diagnosis not present

## 2022-04-05 DIAGNOSIS — K2270 Barrett's esophagus without dysplasia: Secondary | ICD-10-CM | POA: Diagnosis not present

## 2022-04-08 DIAGNOSIS — Z48815 Encounter for surgical aftercare following surgery on the digestive system: Secondary | ICD-10-CM | POA: Diagnosis not present

## 2022-04-08 DIAGNOSIS — R1310 Dysphagia, unspecified: Secondary | ICD-10-CM | POA: Diagnosis not present

## 2022-04-08 DIAGNOSIS — J45909 Unspecified asthma, uncomplicated: Secondary | ICD-10-CM | POA: Diagnosis not present

## 2022-04-08 DIAGNOSIS — R1319 Other dysphagia: Secondary | ICD-10-CM | POA: Diagnosis not present

## 2022-04-08 DIAGNOSIS — G8918 Other acute postprocedural pain: Secondary | ICD-10-CM | POA: Diagnosis not present

## 2022-04-08 DIAGNOSIS — R12 Heartburn: Secondary | ICD-10-CM | POA: Diagnosis not present

## 2022-04-08 DIAGNOSIS — J181 Lobar pneumonia, unspecified organism: Secondary | ICD-10-CM | POA: Diagnosis not present

## 2022-04-08 DIAGNOSIS — K2270 Barrett's esophagus without dysplasia: Secondary | ICD-10-CM | POA: Diagnosis not present

## 2022-04-08 DIAGNOSIS — Z760 Encounter for issue of repeat prescription: Secondary | ICD-10-CM | POA: Diagnosis not present

## 2022-04-08 DIAGNOSIS — Z434 Encounter for attention to other artificial openings of digestive tract: Secondary | ICD-10-CM | POA: Diagnosis not present

## 2022-04-08 DIAGNOSIS — Z431 Encounter for attention to gastrostomy: Secondary | ICD-10-CM | POA: Diagnosis not present

## 2022-04-08 DIAGNOSIS — Z9889 Other specified postprocedural states: Secondary | ICD-10-CM | POA: Diagnosis not present

## 2022-04-08 DIAGNOSIS — J918 Pleural effusion in other conditions classified elsewhere: Secondary | ICD-10-CM | POA: Diagnosis not present

## 2022-04-08 DIAGNOSIS — Z9049 Acquired absence of other specified parts of digestive tract: Secondary | ICD-10-CM | POA: Diagnosis not present

## 2022-04-12 DIAGNOSIS — K2270 Barrett's esophagus without dysplasia: Secondary | ICD-10-CM | POA: Diagnosis not present

## 2022-04-12 DIAGNOSIS — J45909 Unspecified asthma, uncomplicated: Secondary | ICD-10-CM | POA: Diagnosis not present

## 2022-04-12 DIAGNOSIS — J918 Pleural effusion in other conditions classified elsewhere: Secondary | ICD-10-CM | POA: Diagnosis not present

## 2022-04-12 DIAGNOSIS — J181 Lobar pneumonia, unspecified organism: Secondary | ICD-10-CM | POA: Diagnosis not present

## 2022-04-12 DIAGNOSIS — Z431 Encounter for attention to gastrostomy: Secondary | ICD-10-CM | POA: Diagnosis not present

## 2022-04-12 DIAGNOSIS — Z48815 Encounter for surgical aftercare following surgery on the digestive system: Secondary | ICD-10-CM | POA: Diagnosis not present

## 2022-04-14 DIAGNOSIS — R509 Fever, unspecified: Secondary | ICD-10-CM | POA: Diagnosis not present

## 2022-04-14 DIAGNOSIS — R051 Acute cough: Secondary | ICD-10-CM | POA: Diagnosis not present

## 2022-04-14 DIAGNOSIS — R0602 Shortness of breath: Secondary | ICD-10-CM | POA: Diagnosis not present

## 2022-04-14 DIAGNOSIS — Z20822 Contact with and (suspected) exposure to covid-19: Secondary | ICD-10-CM | POA: Diagnosis not present

## 2022-04-14 DIAGNOSIS — R059 Cough, unspecified: Secondary | ICD-10-CM | POA: Diagnosis not present

## 2022-04-14 DIAGNOSIS — J189 Pneumonia, unspecified organism: Secondary | ICD-10-CM | POA: Diagnosis not present

## 2022-04-15 DIAGNOSIS — J181 Lobar pneumonia, unspecified organism: Secondary | ICD-10-CM | POA: Diagnosis not present

## 2022-04-15 DIAGNOSIS — Z48815 Encounter for surgical aftercare following surgery on the digestive system: Secondary | ICD-10-CM | POA: Diagnosis not present

## 2022-04-15 DIAGNOSIS — K2270 Barrett's esophagus without dysplasia: Secondary | ICD-10-CM | POA: Diagnosis not present

## 2022-04-15 DIAGNOSIS — J918 Pleural effusion in other conditions classified elsewhere: Secondary | ICD-10-CM | POA: Diagnosis not present

## 2022-04-15 DIAGNOSIS — J45909 Unspecified asthma, uncomplicated: Secondary | ICD-10-CM | POA: Diagnosis not present

## 2022-04-15 DIAGNOSIS — Z431 Encounter for attention to gastrostomy: Secondary | ICD-10-CM | POA: Diagnosis not present

## 2022-04-18 DIAGNOSIS — J45909 Unspecified asthma, uncomplicated: Secondary | ICD-10-CM | POA: Diagnosis not present

## 2022-04-18 DIAGNOSIS — J181 Lobar pneumonia, unspecified organism: Secondary | ICD-10-CM | POA: Diagnosis not present

## 2022-04-18 DIAGNOSIS — K2270 Barrett's esophagus without dysplasia: Secondary | ICD-10-CM | POA: Diagnosis not present

## 2022-04-18 DIAGNOSIS — K449 Diaphragmatic hernia without obstruction or gangrene: Secondary | ICD-10-CM | POA: Diagnosis not present

## 2022-04-18 DIAGNOSIS — J918 Pleural effusion in other conditions classified elsewhere: Secondary | ICD-10-CM | POA: Diagnosis not present

## 2022-04-18 DIAGNOSIS — Z7951 Long term (current) use of inhaled steroids: Secondary | ICD-10-CM | POA: Diagnosis not present

## 2022-04-18 DIAGNOSIS — K219 Gastro-esophageal reflux disease without esophagitis: Secondary | ICD-10-CM | POA: Diagnosis not present

## 2022-04-18 DIAGNOSIS — Z8701 Personal history of pneumonia (recurrent): Secondary | ICD-10-CM | POA: Diagnosis not present

## 2022-04-18 DIAGNOSIS — Z9049 Acquired absence of other specified parts of digestive tract: Secondary | ICD-10-CM | POA: Diagnosis not present

## 2022-04-18 DIAGNOSIS — E785 Hyperlipidemia, unspecified: Secondary | ICD-10-CM | POA: Diagnosis not present

## 2022-04-18 DIAGNOSIS — Z431 Encounter for attention to gastrostomy: Secondary | ICD-10-CM | POA: Diagnosis not present

## 2022-04-18 DIAGNOSIS — Z48815 Encounter for surgical aftercare following surgery on the digestive system: Secondary | ICD-10-CM | POA: Diagnosis not present

## 2022-04-18 DIAGNOSIS — I2510 Atherosclerotic heart disease of native coronary artery without angina pectoris: Secondary | ICD-10-CM | POA: Diagnosis not present

## 2022-04-18 DIAGNOSIS — Z79891 Long term (current) use of opiate analgesic: Secondary | ICD-10-CM | POA: Diagnosis not present

## 2022-04-18 DIAGNOSIS — Z8616 Personal history of COVID-19: Secondary | ICD-10-CM | POA: Diagnosis not present

## 2022-04-20 DIAGNOSIS — Z48815 Encounter for surgical aftercare following surgery on the digestive system: Secondary | ICD-10-CM | POA: Diagnosis not present

## 2022-04-20 DIAGNOSIS — J918 Pleural effusion in other conditions classified elsewhere: Secondary | ICD-10-CM | POA: Diagnosis not present

## 2022-04-20 DIAGNOSIS — J45909 Unspecified asthma, uncomplicated: Secondary | ICD-10-CM | POA: Diagnosis not present

## 2022-04-20 DIAGNOSIS — J181 Lobar pneumonia, unspecified organism: Secondary | ICD-10-CM | POA: Diagnosis not present

## 2022-04-20 DIAGNOSIS — K2270 Barrett's esophagus without dysplasia: Secondary | ICD-10-CM | POA: Diagnosis not present

## 2022-04-20 DIAGNOSIS — Z431 Encounter for attention to gastrostomy: Secondary | ICD-10-CM | POA: Diagnosis not present

## 2022-04-28 DIAGNOSIS — J918 Pleural effusion in other conditions classified elsewhere: Secondary | ICD-10-CM | POA: Diagnosis not present

## 2022-04-28 DIAGNOSIS — Z431 Encounter for attention to gastrostomy: Secondary | ICD-10-CM | POA: Diagnosis not present

## 2022-04-28 DIAGNOSIS — Z48815 Encounter for surgical aftercare following surgery on the digestive system: Secondary | ICD-10-CM | POA: Diagnosis not present

## 2022-04-28 DIAGNOSIS — J45909 Unspecified asthma, uncomplicated: Secondary | ICD-10-CM | POA: Diagnosis not present

## 2022-04-28 DIAGNOSIS — K2270 Barrett's esophagus without dysplasia: Secondary | ICD-10-CM | POA: Diagnosis not present

## 2022-04-28 DIAGNOSIS — J181 Lobar pneumonia, unspecified organism: Secondary | ICD-10-CM | POA: Diagnosis not present

## 2022-04-29 DIAGNOSIS — K2270 Barrett's esophagus without dysplasia: Secondary | ICD-10-CM | POA: Diagnosis not present

## 2022-04-29 DIAGNOSIS — Z48815 Encounter for surgical aftercare following surgery on the digestive system: Secondary | ICD-10-CM | POA: Diagnosis not present

## 2022-04-29 DIAGNOSIS — J918 Pleural effusion in other conditions classified elsewhere: Secondary | ICD-10-CM | POA: Diagnosis not present

## 2022-04-29 DIAGNOSIS — Z431 Encounter for attention to gastrostomy: Secondary | ICD-10-CM | POA: Diagnosis not present

## 2022-04-29 DIAGNOSIS — J181 Lobar pneumonia, unspecified organism: Secondary | ICD-10-CM | POA: Diagnosis not present

## 2022-04-29 DIAGNOSIS — J45909 Unspecified asthma, uncomplicated: Secondary | ICD-10-CM | POA: Diagnosis not present

## 2022-04-30 DIAGNOSIS — Z431 Encounter for attention to gastrostomy: Secondary | ICD-10-CM | POA: Diagnosis not present

## 2022-04-30 DIAGNOSIS — G8918 Other acute postprocedural pain: Secondary | ICD-10-CM | POA: Diagnosis not present

## 2022-04-30 DIAGNOSIS — K2270 Barrett's esophagus without dysplasia: Secondary | ICD-10-CM | POA: Diagnosis not present

## 2022-04-30 DIAGNOSIS — J189 Pneumonia, unspecified organism: Secondary | ICD-10-CM | POA: Diagnosis not present

## 2022-04-30 DIAGNOSIS — J918 Pleural effusion in other conditions classified elsewhere: Secondary | ICD-10-CM | POA: Diagnosis not present

## 2022-04-30 DIAGNOSIS — R918 Other nonspecific abnormal finding of lung field: Secondary | ICD-10-CM | POA: Diagnosis not present

## 2022-04-30 DIAGNOSIS — R112 Nausea with vomiting, unspecified: Secondary | ICD-10-CM | POA: Diagnosis not present

## 2022-04-30 DIAGNOSIS — J984 Other disorders of lung: Secondary | ICD-10-CM | POA: Diagnosis not present

## 2022-04-30 DIAGNOSIS — Z452 Encounter for adjustment and management of vascular access device: Secondary | ICD-10-CM | POA: Diagnosis not present

## 2022-04-30 DIAGNOSIS — J181 Lobar pneumonia, unspecified organism: Secondary | ICD-10-CM | POA: Diagnosis not present

## 2022-04-30 DIAGNOSIS — R195 Other fecal abnormalities: Secondary | ICD-10-CM | POA: Diagnosis not present

## 2022-04-30 DIAGNOSIS — J45909 Unspecified asthma, uncomplicated: Secondary | ICD-10-CM | POA: Diagnosis not present

## 2022-04-30 DIAGNOSIS — Z48815 Encounter for surgical aftercare following surgery on the digestive system: Secondary | ICD-10-CM | POA: Diagnosis not present

## 2022-04-30 DIAGNOSIS — Z8701 Personal history of pneumonia (recurrent): Secondary | ICD-10-CM | POA: Diagnosis not present

## 2022-05-04 DIAGNOSIS — K2270 Barrett's esophagus without dysplasia: Secondary | ICD-10-CM | POA: Diagnosis not present

## 2022-05-04 DIAGNOSIS — J181 Lobar pneumonia, unspecified organism: Secondary | ICD-10-CM | POA: Diagnosis not present

## 2022-05-04 DIAGNOSIS — Z431 Encounter for attention to gastrostomy: Secondary | ICD-10-CM | POA: Diagnosis not present

## 2022-05-04 DIAGNOSIS — J918 Pleural effusion in other conditions classified elsewhere: Secondary | ICD-10-CM | POA: Diagnosis not present

## 2022-05-04 DIAGNOSIS — Z48815 Encounter for surgical aftercare following surgery on the digestive system: Secondary | ICD-10-CM | POA: Diagnosis not present

## 2022-05-04 DIAGNOSIS — J45909 Unspecified asthma, uncomplicated: Secondary | ICD-10-CM | POA: Diagnosis not present

## 2022-05-06 DIAGNOSIS — J181 Lobar pneumonia, unspecified organism: Secondary | ICD-10-CM | POA: Diagnosis not present

## 2022-05-06 DIAGNOSIS — Z431 Encounter for attention to gastrostomy: Secondary | ICD-10-CM | POA: Diagnosis not present

## 2022-05-06 DIAGNOSIS — Z48815 Encounter for surgical aftercare following surgery on the digestive system: Secondary | ICD-10-CM | POA: Diagnosis not present

## 2022-05-06 DIAGNOSIS — K2270 Barrett's esophagus without dysplasia: Secondary | ICD-10-CM | POA: Diagnosis not present

## 2022-05-06 DIAGNOSIS — J45909 Unspecified asthma, uncomplicated: Secondary | ICD-10-CM | POA: Diagnosis not present

## 2022-05-06 DIAGNOSIS — J918 Pleural effusion in other conditions classified elsewhere: Secondary | ICD-10-CM | POA: Diagnosis not present

## 2022-05-07 DIAGNOSIS — R112 Nausea with vomiting, unspecified: Secondary | ICD-10-CM | POA: Diagnosis not present

## 2022-05-07 DIAGNOSIS — I700 Atherosclerosis of aorta: Secondary | ICD-10-CM | POA: Diagnosis not present

## 2022-05-07 DIAGNOSIS — R195 Other fecal abnormalities: Secondary | ICD-10-CM | POA: Diagnosis not present

## 2022-05-07 DIAGNOSIS — R1032 Left lower quadrant pain: Secondary | ICD-10-CM | POA: Diagnosis not present

## 2022-05-07 DIAGNOSIS — R109 Unspecified abdominal pain: Secondary | ICD-10-CM | POA: Diagnosis not present

## 2022-05-11 DIAGNOSIS — M25512 Pain in left shoulder: Secondary | ICD-10-CM | POA: Diagnosis not present

## 2022-05-11 DIAGNOSIS — K2270 Barrett's esophagus without dysplasia: Secondary | ICD-10-CM | POA: Diagnosis not present

## 2022-05-11 DIAGNOSIS — Z431 Encounter for attention to gastrostomy: Secondary | ICD-10-CM | POA: Diagnosis not present

## 2022-05-11 DIAGNOSIS — Z48815 Encounter for surgical aftercare following surgery on the digestive system: Secondary | ICD-10-CM | POA: Diagnosis not present

## 2022-05-11 DIAGNOSIS — J181 Lobar pneumonia, unspecified organism: Secondary | ICD-10-CM | POA: Diagnosis not present

## 2022-05-11 DIAGNOSIS — R051 Acute cough: Secondary | ICD-10-CM | POA: Diagnosis not present

## 2022-05-11 DIAGNOSIS — J918 Pleural effusion in other conditions classified elsewhere: Secondary | ICD-10-CM | POA: Diagnosis not present

## 2022-05-11 DIAGNOSIS — J45909 Unspecified asthma, uncomplicated: Secondary | ICD-10-CM | POA: Diagnosis not present

## 2022-05-12 DIAGNOSIS — Z9889 Other specified postprocedural states: Secondary | ICD-10-CM | POA: Diagnosis not present

## 2022-05-12 DIAGNOSIS — Z431 Encounter for attention to gastrostomy: Secondary | ICD-10-CM | POA: Diagnosis not present

## 2022-05-12 DIAGNOSIS — R051 Acute cough: Secondary | ICD-10-CM | POA: Diagnosis not present

## 2022-05-12 DIAGNOSIS — K2270 Barrett's esophagus without dysplasia: Secondary | ICD-10-CM | POA: Diagnosis not present

## 2022-05-12 DIAGNOSIS — J181 Lobar pneumonia, unspecified organism: Secondary | ICD-10-CM | POA: Diagnosis not present

## 2022-05-12 DIAGNOSIS — R059 Cough, unspecified: Secondary | ICD-10-CM | POA: Diagnosis not present

## 2022-05-12 DIAGNOSIS — M25512 Pain in left shoulder: Secondary | ICD-10-CM | POA: Diagnosis not present

## 2022-05-12 DIAGNOSIS — J45909 Unspecified asthma, uncomplicated: Secondary | ICD-10-CM | POA: Diagnosis not present

## 2022-05-12 DIAGNOSIS — Z48815 Encounter for surgical aftercare following surgery on the digestive system: Secondary | ICD-10-CM | POA: Diagnosis not present

## 2022-05-12 DIAGNOSIS — J918 Pleural effusion in other conditions classified elsewhere: Secondary | ICD-10-CM | POA: Diagnosis not present

## 2022-05-13 DIAGNOSIS — J181 Lobar pneumonia, unspecified organism: Secondary | ICD-10-CM | POA: Diagnosis not present

## 2022-05-13 DIAGNOSIS — R7989 Other specified abnormal findings of blood chemistry: Secondary | ICD-10-CM | POA: Diagnosis not present

## 2022-05-13 DIAGNOSIS — Z431 Encounter for attention to gastrostomy: Secondary | ICD-10-CM | POA: Diagnosis not present

## 2022-05-13 DIAGNOSIS — K2270 Barrett's esophagus without dysplasia: Secondary | ICD-10-CM | POA: Diagnosis not present

## 2022-05-13 DIAGNOSIS — Z48815 Encounter for surgical aftercare following surgery on the digestive system: Secondary | ICD-10-CM | POA: Diagnosis not present

## 2022-05-13 DIAGNOSIS — R059 Cough, unspecified: Secondary | ICD-10-CM | POA: Diagnosis not present

## 2022-05-13 DIAGNOSIS — J45909 Unspecified asthma, uncomplicated: Secondary | ICD-10-CM | POA: Diagnosis not present

## 2022-05-13 DIAGNOSIS — J918 Pleural effusion in other conditions classified elsewhere: Secondary | ICD-10-CM | POA: Diagnosis not present

## 2022-05-13 DIAGNOSIS — R0602 Shortness of breath: Secondary | ICD-10-CM | POA: Diagnosis not present

## 2022-05-14 DIAGNOSIS — J918 Pleural effusion in other conditions classified elsewhere: Secondary | ICD-10-CM | POA: Diagnosis not present

## 2022-05-14 DIAGNOSIS — Z48815 Encounter for surgical aftercare following surgery on the digestive system: Secondary | ICD-10-CM | POA: Diagnosis not present

## 2022-05-14 DIAGNOSIS — J45909 Unspecified asthma, uncomplicated: Secondary | ICD-10-CM | POA: Diagnosis not present

## 2022-05-14 DIAGNOSIS — K2270 Barrett's esophagus without dysplasia: Secondary | ICD-10-CM | POA: Diagnosis not present

## 2022-05-14 DIAGNOSIS — J181 Lobar pneumonia, unspecified organism: Secondary | ICD-10-CM | POA: Diagnosis not present

## 2022-05-14 DIAGNOSIS — Z431 Encounter for attention to gastrostomy: Secondary | ICD-10-CM | POA: Diagnosis not present

## 2022-05-17 DIAGNOSIS — J918 Pleural effusion in other conditions classified elsewhere: Secondary | ICD-10-CM | POA: Diagnosis not present

## 2022-05-17 DIAGNOSIS — Z431 Encounter for attention to gastrostomy: Secondary | ICD-10-CM | POA: Diagnosis not present

## 2022-05-17 DIAGNOSIS — J45909 Unspecified asthma, uncomplicated: Secondary | ICD-10-CM | POA: Diagnosis not present

## 2022-05-17 DIAGNOSIS — Z48815 Encounter for surgical aftercare following surgery on the digestive system: Secondary | ICD-10-CM | POA: Diagnosis not present

## 2022-05-17 DIAGNOSIS — K2270 Barrett's esophagus without dysplasia: Secondary | ICD-10-CM | POA: Diagnosis not present

## 2022-05-17 DIAGNOSIS — J181 Lobar pneumonia, unspecified organism: Secondary | ICD-10-CM | POA: Diagnosis not present

## 2022-05-18 DIAGNOSIS — Z8701 Personal history of pneumonia (recurrent): Secondary | ICD-10-CM | POA: Diagnosis not present

## 2022-05-18 DIAGNOSIS — E785 Hyperlipidemia, unspecified: Secondary | ICD-10-CM | POA: Diagnosis not present

## 2022-05-18 DIAGNOSIS — K449 Diaphragmatic hernia without obstruction or gangrene: Secondary | ICD-10-CM | POA: Diagnosis not present

## 2022-05-18 DIAGNOSIS — Z79891 Long term (current) use of opiate analgesic: Secondary | ICD-10-CM | POA: Diagnosis not present

## 2022-05-18 DIAGNOSIS — Z48815 Encounter for surgical aftercare following surgery on the digestive system: Secondary | ICD-10-CM | POA: Diagnosis not present

## 2022-05-18 DIAGNOSIS — J918 Pleural effusion in other conditions classified elsewhere: Secondary | ICD-10-CM | POA: Diagnosis not present

## 2022-05-18 DIAGNOSIS — I2510 Atherosclerotic heart disease of native coronary artery without angina pectoris: Secondary | ICD-10-CM | POA: Diagnosis not present

## 2022-05-18 DIAGNOSIS — Z9049 Acquired absence of other specified parts of digestive tract: Secondary | ICD-10-CM | POA: Diagnosis not present

## 2022-05-18 DIAGNOSIS — Z431 Encounter for attention to gastrostomy: Secondary | ICD-10-CM | POA: Diagnosis not present

## 2022-05-18 DIAGNOSIS — Z8616 Personal history of COVID-19: Secondary | ICD-10-CM | POA: Diagnosis not present

## 2022-05-18 DIAGNOSIS — K219 Gastro-esophageal reflux disease without esophagitis: Secondary | ICD-10-CM | POA: Diagnosis not present

## 2022-05-18 DIAGNOSIS — J181 Lobar pneumonia, unspecified organism: Secondary | ICD-10-CM | POA: Diagnosis not present

## 2022-05-18 DIAGNOSIS — J45909 Unspecified asthma, uncomplicated: Secondary | ICD-10-CM | POA: Diagnosis not present

## 2022-05-18 DIAGNOSIS — Z7951 Long term (current) use of inhaled steroids: Secondary | ICD-10-CM | POA: Diagnosis not present

## 2022-05-18 DIAGNOSIS — K2270 Barrett's esophagus without dysplasia: Secondary | ICD-10-CM | POA: Diagnosis not present

## 2022-05-19 DIAGNOSIS — J45909 Unspecified asthma, uncomplicated: Secondary | ICD-10-CM | POA: Diagnosis not present

## 2022-05-19 DIAGNOSIS — J918 Pleural effusion in other conditions classified elsewhere: Secondary | ICD-10-CM | POA: Diagnosis not present

## 2022-05-19 DIAGNOSIS — Z48815 Encounter for surgical aftercare following surgery on the digestive system: Secondary | ICD-10-CM | POA: Diagnosis not present

## 2022-05-19 DIAGNOSIS — J181 Lobar pneumonia, unspecified organism: Secondary | ICD-10-CM | POA: Diagnosis not present

## 2022-05-19 DIAGNOSIS — K2270 Barrett's esophagus without dysplasia: Secondary | ICD-10-CM | POA: Diagnosis not present

## 2022-05-19 DIAGNOSIS — Z431 Encounter for attention to gastrostomy: Secondary | ICD-10-CM | POA: Diagnosis not present

## 2022-05-20 DIAGNOSIS — D649 Anemia, unspecified: Secondary | ICD-10-CM | POA: Diagnosis not present

## 2022-05-20 DIAGNOSIS — J181 Lobar pneumonia, unspecified organism: Secondary | ICD-10-CM | POA: Diagnosis not present

## 2022-05-20 DIAGNOSIS — J918 Pleural effusion in other conditions classified elsewhere: Secondary | ICD-10-CM | POA: Diagnosis not present

## 2022-05-20 DIAGNOSIS — J45909 Unspecified asthma, uncomplicated: Secondary | ICD-10-CM | POA: Diagnosis not present

## 2022-05-20 DIAGNOSIS — R051 Acute cough: Secondary | ICD-10-CM | POA: Diagnosis not present

## 2022-05-20 DIAGNOSIS — Z48815 Encounter for surgical aftercare following surgery on the digestive system: Secondary | ICD-10-CM | POA: Diagnosis not present

## 2022-05-20 DIAGNOSIS — R1319 Other dysphagia: Secondary | ICD-10-CM | POA: Diagnosis not present

## 2022-05-20 DIAGNOSIS — R000 Tachycardia, unspecified: Secondary | ICD-10-CM | POA: Diagnosis not present

## 2022-05-20 DIAGNOSIS — Z431 Encounter for attention to gastrostomy: Secondary | ICD-10-CM | POA: Diagnosis not present

## 2022-05-20 DIAGNOSIS — K9041 Non-celiac gluten sensitivity: Secondary | ICD-10-CM | POA: Diagnosis not present

## 2022-05-20 DIAGNOSIS — K22710 Barrett's esophagus with low grade dysplasia: Secondary | ICD-10-CM | POA: Diagnosis not present

## 2022-05-20 DIAGNOSIS — R5383 Other fatigue: Secondary | ICD-10-CM | POA: Diagnosis not present

## 2022-05-20 DIAGNOSIS — I2510 Atherosclerotic heart disease of native coronary artery without angina pectoris: Secondary | ICD-10-CM | POA: Diagnosis not present

## 2022-05-20 DIAGNOSIS — Z9049 Acquired absence of other specified parts of digestive tract: Secondary | ICD-10-CM | POA: Diagnosis not present

## 2022-05-20 DIAGNOSIS — J189 Pneumonia, unspecified organism: Secondary | ICD-10-CM | POA: Diagnosis not present

## 2022-05-20 DIAGNOSIS — K2270 Barrett's esophagus without dysplasia: Secondary | ICD-10-CM | POA: Diagnosis not present

## 2022-05-20 DIAGNOSIS — J4520 Mild intermittent asthma, uncomplicated: Secondary | ICD-10-CM | POA: Diagnosis not present

## 2022-05-25 DIAGNOSIS — R110 Nausea: Secondary | ICD-10-CM | POA: Diagnosis not present

## 2022-05-25 DIAGNOSIS — K2270 Barrett's esophagus without dysplasia: Secondary | ICD-10-CM | POA: Diagnosis not present

## 2022-05-25 DIAGNOSIS — J45909 Unspecified asthma, uncomplicated: Secondary | ICD-10-CM | POA: Diagnosis not present

## 2022-05-25 DIAGNOSIS — Z431 Encounter for attention to gastrostomy: Secondary | ICD-10-CM | POA: Diagnosis not present

## 2022-05-25 DIAGNOSIS — Z681 Body mass index (BMI) 19 or less, adult: Secondary | ICD-10-CM | POA: Diagnosis not present

## 2022-05-25 DIAGNOSIS — J918 Pleural effusion in other conditions classified elsewhere: Secondary | ICD-10-CM | POA: Diagnosis not present

## 2022-05-25 DIAGNOSIS — Z48815 Encounter for surgical aftercare following surgery on the digestive system: Secondary | ICD-10-CM | POA: Diagnosis not present

## 2022-05-25 DIAGNOSIS — J181 Lobar pneumonia, unspecified organism: Secondary | ICD-10-CM | POA: Diagnosis not present

## 2022-05-26 DIAGNOSIS — J181 Lobar pneumonia, unspecified organism: Secondary | ICD-10-CM | POA: Diagnosis not present

## 2022-05-26 DIAGNOSIS — Z48815 Encounter for surgical aftercare following surgery on the digestive system: Secondary | ICD-10-CM | POA: Diagnosis not present

## 2022-05-26 DIAGNOSIS — J45909 Unspecified asthma, uncomplicated: Secondary | ICD-10-CM | POA: Diagnosis not present

## 2022-05-26 DIAGNOSIS — J918 Pleural effusion in other conditions classified elsewhere: Secondary | ICD-10-CM | POA: Diagnosis not present

## 2022-05-26 DIAGNOSIS — Z431 Encounter for attention to gastrostomy: Secondary | ICD-10-CM | POA: Diagnosis not present

## 2022-05-26 DIAGNOSIS — K2270 Barrett's esophagus without dysplasia: Secondary | ICD-10-CM | POA: Diagnosis not present

## 2022-05-27 DIAGNOSIS — M19012 Primary osteoarthritis, left shoulder: Secondary | ICD-10-CM | POA: Diagnosis not present

## 2022-05-27 DIAGNOSIS — K2270 Barrett's esophagus without dysplasia: Secondary | ICD-10-CM | POA: Diagnosis not present

## 2022-05-27 DIAGNOSIS — M67812 Other specified disorders of synovium, left shoulder: Secondary | ICD-10-CM | POA: Diagnosis not present

## 2022-05-27 DIAGNOSIS — R1310 Dysphagia, unspecified: Secondary | ICD-10-CM | POA: Diagnosis not present

## 2022-05-28 DIAGNOSIS — Z452 Encounter for adjustment and management of vascular access device: Secondary | ICD-10-CM | POA: Diagnosis not present

## 2022-05-28 DIAGNOSIS — Z789 Other specified health status: Secondary | ICD-10-CM | POA: Diagnosis not present

## 2022-06-01 DIAGNOSIS — Z431 Encounter for attention to gastrostomy: Secondary | ICD-10-CM | POA: Diagnosis not present

## 2022-06-01 DIAGNOSIS — K219 Gastro-esophageal reflux disease without esophagitis: Secondary | ICD-10-CM | POA: Diagnosis not present

## 2022-06-01 DIAGNOSIS — Z8616 Personal history of COVID-19: Secondary | ICD-10-CM | POA: Diagnosis not present

## 2022-06-01 DIAGNOSIS — R0600 Dyspnea, unspecified: Secondary | ICD-10-CM | POA: Diagnosis not present

## 2022-06-01 DIAGNOSIS — K2270 Barrett's esophagus without dysplasia: Secondary | ICD-10-CM | POA: Diagnosis not present

## 2022-06-01 DIAGNOSIS — J181 Lobar pneumonia, unspecified organism: Secondary | ICD-10-CM | POA: Diagnosis not present

## 2022-06-01 DIAGNOSIS — Z48815 Encounter for surgical aftercare following surgery on the digestive system: Secondary | ICD-10-CM | POA: Diagnosis not present

## 2022-06-01 DIAGNOSIS — J4550 Severe persistent asthma, uncomplicated: Secondary | ICD-10-CM | POA: Diagnosis not present

## 2022-06-01 DIAGNOSIS — J918 Pleural effusion in other conditions classified elsewhere: Secondary | ICD-10-CM | POA: Diagnosis not present

## 2022-06-01 DIAGNOSIS — J45909 Unspecified asthma, uncomplicated: Secondary | ICD-10-CM | POA: Diagnosis not present

## 2022-06-02 DIAGNOSIS — Z48815 Encounter for surgical aftercare following surgery on the digestive system: Secondary | ICD-10-CM | POA: Diagnosis not present

## 2022-06-02 DIAGNOSIS — J181 Lobar pneumonia, unspecified organism: Secondary | ICD-10-CM | POA: Diagnosis not present

## 2022-06-02 DIAGNOSIS — M25512 Pain in left shoulder: Secondary | ICD-10-CM | POA: Diagnosis not present

## 2022-06-02 DIAGNOSIS — Z431 Encounter for attention to gastrostomy: Secondary | ICD-10-CM | POA: Diagnosis not present

## 2022-06-02 DIAGNOSIS — K2270 Barrett's esophagus without dysplasia: Secondary | ICD-10-CM | POA: Diagnosis not present

## 2022-06-02 DIAGNOSIS — J45909 Unspecified asthma, uncomplicated: Secondary | ICD-10-CM | POA: Diagnosis not present

## 2022-06-02 DIAGNOSIS — J918 Pleural effusion in other conditions classified elsewhere: Secondary | ICD-10-CM | POA: Diagnosis not present

## 2022-06-07 DIAGNOSIS — J45909 Unspecified asthma, uncomplicated: Secondary | ICD-10-CM | POA: Diagnosis not present

## 2022-06-07 DIAGNOSIS — J918 Pleural effusion in other conditions classified elsewhere: Secondary | ICD-10-CM | POA: Diagnosis not present

## 2022-06-07 DIAGNOSIS — J181 Lobar pneumonia, unspecified organism: Secondary | ICD-10-CM | POA: Diagnosis not present

## 2022-06-07 DIAGNOSIS — Z431 Encounter for attention to gastrostomy: Secondary | ICD-10-CM | POA: Diagnosis not present

## 2022-06-07 DIAGNOSIS — Z48815 Encounter for surgical aftercare following surgery on the digestive system: Secondary | ICD-10-CM | POA: Diagnosis not present

## 2022-06-07 DIAGNOSIS — K2270 Barrett's esophagus without dysplasia: Secondary | ICD-10-CM | POA: Diagnosis not present

## 2022-06-08 DIAGNOSIS — J45909 Unspecified asthma, uncomplicated: Secondary | ICD-10-CM | POA: Diagnosis not present

## 2022-06-08 DIAGNOSIS — Z48815 Encounter for surgical aftercare following surgery on the digestive system: Secondary | ICD-10-CM | POA: Diagnosis not present

## 2022-06-08 DIAGNOSIS — J918 Pleural effusion in other conditions classified elsewhere: Secondary | ICD-10-CM | POA: Diagnosis not present

## 2022-06-08 DIAGNOSIS — J181 Lobar pneumonia, unspecified organism: Secondary | ICD-10-CM | POA: Diagnosis not present

## 2022-06-08 DIAGNOSIS — K2270 Barrett's esophagus without dysplasia: Secondary | ICD-10-CM | POA: Diagnosis not present

## 2022-06-08 DIAGNOSIS — Z431 Encounter for attention to gastrostomy: Secondary | ICD-10-CM | POA: Diagnosis not present

## 2022-06-10 DIAGNOSIS — Z431 Encounter for attention to gastrostomy: Secondary | ICD-10-CM | POA: Diagnosis not present

## 2022-06-10 DIAGNOSIS — J918 Pleural effusion in other conditions classified elsewhere: Secondary | ICD-10-CM | POA: Diagnosis not present

## 2022-06-10 DIAGNOSIS — J45909 Unspecified asthma, uncomplicated: Secondary | ICD-10-CM | POA: Diagnosis not present

## 2022-06-10 DIAGNOSIS — Z48815 Encounter for surgical aftercare following surgery on the digestive system: Secondary | ICD-10-CM | POA: Diagnosis not present

## 2022-06-10 DIAGNOSIS — J181 Lobar pneumonia, unspecified organism: Secondary | ICD-10-CM | POA: Diagnosis not present

## 2022-06-10 DIAGNOSIS — K2270 Barrett's esophagus without dysplasia: Secondary | ICD-10-CM | POA: Diagnosis not present

## 2022-06-14 DIAGNOSIS — R1110 Vomiting, unspecified: Secondary | ICD-10-CM | POA: Diagnosis not present

## 2022-06-14 DIAGNOSIS — R059 Cough, unspecified: Secondary | ICD-10-CM | POA: Diagnosis not present

## 2022-06-14 DIAGNOSIS — R195 Other fecal abnormalities: Secondary | ICD-10-CM | POA: Diagnosis not present

## 2022-06-14 DIAGNOSIS — R109 Unspecified abdominal pain: Secondary | ICD-10-CM | POA: Diagnosis not present

## 2022-06-14 DIAGNOSIS — R112 Nausea with vomiting, unspecified: Secondary | ICD-10-CM | POA: Diagnosis not present

## 2022-06-15 DIAGNOSIS — J180 Bronchopneumonia, unspecified organism: Secondary | ICD-10-CM | POA: Diagnosis not present

## 2022-06-15 DIAGNOSIS — K219 Gastro-esophageal reflux disease without esophagitis: Secondary | ICD-10-CM | POA: Diagnosis not present

## 2022-06-15 DIAGNOSIS — K2270 Barrett's esophagus without dysplasia: Secondary | ICD-10-CM | POA: Diagnosis not present

## 2022-06-15 DIAGNOSIS — R0600 Dyspnea, unspecified: Secondary | ICD-10-CM | POA: Diagnosis not present

## 2022-06-15 DIAGNOSIS — J45909 Unspecified asthma, uncomplicated: Secondary | ICD-10-CM | POA: Diagnosis not present

## 2022-06-15 DIAGNOSIS — J4550 Severe persistent asthma, uncomplicated: Secondary | ICD-10-CM | POA: Diagnosis not present

## 2022-06-15 DIAGNOSIS — J181 Lobar pneumonia, unspecified organism: Secondary | ICD-10-CM | POA: Diagnosis not present

## 2022-06-15 DIAGNOSIS — Z431 Encounter for attention to gastrostomy: Secondary | ICD-10-CM | POA: Diagnosis not present

## 2022-06-15 DIAGNOSIS — J918 Pleural effusion in other conditions classified elsewhere: Secondary | ICD-10-CM | POA: Diagnosis not present

## 2022-06-15 DIAGNOSIS — Z48815 Encounter for surgical aftercare following surgery on the digestive system: Secondary | ICD-10-CM | POA: Diagnosis not present

## 2022-06-15 DIAGNOSIS — Z8616 Personal history of COVID-19: Secondary | ICD-10-CM | POA: Diagnosis not present

## 2022-06-16 DIAGNOSIS — Z9889 Other specified postprocedural states: Secondary | ICD-10-CM | POA: Diagnosis not present

## 2022-06-16 DIAGNOSIS — K22710 Barrett's esophagus with low grade dysplasia: Secondary | ICD-10-CM | POA: Diagnosis not present

## 2022-06-16 DIAGNOSIS — G8918 Other acute postprocedural pain: Secondary | ICD-10-CM | POA: Diagnosis not present

## 2022-06-16 DIAGNOSIS — Z9049 Acquired absence of other specified parts of digestive tract: Secondary | ICD-10-CM | POA: Diagnosis not present

## 2022-06-17 DIAGNOSIS — J918 Pleural effusion in other conditions classified elsewhere: Secondary | ICD-10-CM | POA: Diagnosis not present

## 2022-06-17 DIAGNOSIS — Z9049 Acquired absence of other specified parts of digestive tract: Secondary | ICD-10-CM | POA: Diagnosis not present

## 2022-06-17 DIAGNOSIS — Z8616 Personal history of COVID-19: Secondary | ICD-10-CM | POA: Diagnosis not present

## 2022-06-17 DIAGNOSIS — J45909 Unspecified asthma, uncomplicated: Secondary | ICD-10-CM | POA: Diagnosis not present

## 2022-06-17 DIAGNOSIS — Z8701 Personal history of pneumonia (recurrent): Secondary | ICD-10-CM | POA: Diagnosis not present

## 2022-06-17 DIAGNOSIS — I2510 Atherosclerotic heart disease of native coronary artery without angina pectoris: Secondary | ICD-10-CM | POA: Diagnosis not present

## 2022-06-17 DIAGNOSIS — E785 Hyperlipidemia, unspecified: Secondary | ICD-10-CM | POA: Diagnosis not present

## 2022-06-17 DIAGNOSIS — Z48815 Encounter for surgical aftercare following surgery on the digestive system: Secondary | ICD-10-CM | POA: Diagnosis not present

## 2022-06-17 DIAGNOSIS — K219 Gastro-esophageal reflux disease without esophagitis: Secondary | ICD-10-CM | POA: Diagnosis not present

## 2022-06-17 DIAGNOSIS — Z7951 Long term (current) use of inhaled steroids: Secondary | ICD-10-CM | POA: Diagnosis not present

## 2022-06-17 DIAGNOSIS — Z431 Encounter for attention to gastrostomy: Secondary | ICD-10-CM | POA: Diagnosis not present

## 2022-06-17 DIAGNOSIS — K2270 Barrett's esophagus without dysplasia: Secondary | ICD-10-CM | POA: Diagnosis not present

## 2022-06-17 DIAGNOSIS — Z79891 Long term (current) use of opiate analgesic: Secondary | ICD-10-CM | POA: Diagnosis not present

## 2022-06-17 DIAGNOSIS — J181 Lobar pneumonia, unspecified organism: Secondary | ICD-10-CM | POA: Diagnosis not present

## 2022-06-17 DIAGNOSIS — K449 Diaphragmatic hernia without obstruction or gangrene: Secondary | ICD-10-CM | POA: Diagnosis not present

## 2022-06-18 DIAGNOSIS — Z431 Encounter for attention to gastrostomy: Secondary | ICD-10-CM | POA: Diagnosis not present

## 2022-06-18 DIAGNOSIS — J181 Lobar pneumonia, unspecified organism: Secondary | ICD-10-CM | POA: Diagnosis not present

## 2022-06-18 DIAGNOSIS — J918 Pleural effusion in other conditions classified elsewhere: Secondary | ICD-10-CM | POA: Diagnosis not present

## 2022-06-18 DIAGNOSIS — J45909 Unspecified asthma, uncomplicated: Secondary | ICD-10-CM | POA: Diagnosis not present

## 2022-06-18 DIAGNOSIS — Z48815 Encounter for surgical aftercare following surgery on the digestive system: Secondary | ICD-10-CM | POA: Diagnosis not present

## 2022-06-18 DIAGNOSIS — K2270 Barrett's esophagus without dysplasia: Secondary | ICD-10-CM | POA: Diagnosis not present

## 2022-06-21 DIAGNOSIS — Z431 Encounter for attention to gastrostomy: Secondary | ICD-10-CM | POA: Diagnosis not present

## 2022-06-21 DIAGNOSIS — K2210 Ulcer of esophagus without bleeding: Secondary | ICD-10-CM | POA: Diagnosis not present

## 2022-06-21 DIAGNOSIS — Z48815 Encounter for surgical aftercare following surgery on the digestive system: Secondary | ICD-10-CM | POA: Diagnosis not present

## 2022-06-21 DIAGNOSIS — J918 Pleural effusion in other conditions classified elsewhere: Secondary | ICD-10-CM | POA: Diagnosis not present

## 2022-06-21 DIAGNOSIS — J45909 Unspecified asthma, uncomplicated: Secondary | ICD-10-CM | POA: Diagnosis not present

## 2022-06-21 DIAGNOSIS — R1314 Dysphagia, pharyngoesophageal phase: Secondary | ICD-10-CM | POA: Diagnosis not present

## 2022-06-21 DIAGNOSIS — K2270 Barrett's esophagus without dysplasia: Secondary | ICD-10-CM | POA: Diagnosis not present

## 2022-06-21 DIAGNOSIS — J181 Lobar pneumonia, unspecified organism: Secondary | ICD-10-CM | POA: Diagnosis not present

## 2022-06-21 DIAGNOSIS — K259 Gastric ulcer, unspecified as acute or chronic, without hemorrhage or perforation: Secondary | ICD-10-CM | POA: Diagnosis not present

## 2022-06-22 DIAGNOSIS — J918 Pleural effusion in other conditions classified elsewhere: Secondary | ICD-10-CM | POA: Diagnosis not present

## 2022-06-22 DIAGNOSIS — J181 Lobar pneumonia, unspecified organism: Secondary | ICD-10-CM | POA: Diagnosis not present

## 2022-06-22 DIAGNOSIS — K2270 Barrett's esophagus without dysplasia: Secondary | ICD-10-CM | POA: Diagnosis not present

## 2022-06-22 DIAGNOSIS — Z431 Encounter for attention to gastrostomy: Secondary | ICD-10-CM | POA: Diagnosis not present

## 2022-06-22 DIAGNOSIS — J45909 Unspecified asthma, uncomplicated: Secondary | ICD-10-CM | POA: Diagnosis not present

## 2022-06-22 DIAGNOSIS — Z48815 Encounter for surgical aftercare following surgery on the digestive system: Secondary | ICD-10-CM | POA: Diagnosis not present

## 2022-06-24 DIAGNOSIS — K2270 Barrett's esophagus without dysplasia: Secondary | ICD-10-CM | POA: Diagnosis not present

## 2022-06-24 DIAGNOSIS — J45909 Unspecified asthma, uncomplicated: Secondary | ICD-10-CM | POA: Diagnosis not present

## 2022-06-24 DIAGNOSIS — J918 Pleural effusion in other conditions classified elsewhere: Secondary | ICD-10-CM | POA: Diagnosis not present

## 2022-06-24 DIAGNOSIS — Z48815 Encounter for surgical aftercare following surgery on the digestive system: Secondary | ICD-10-CM | POA: Diagnosis not present

## 2022-06-24 DIAGNOSIS — J181 Lobar pneumonia, unspecified organism: Secondary | ICD-10-CM | POA: Diagnosis not present

## 2022-06-24 DIAGNOSIS — Z431 Encounter for attention to gastrostomy: Secondary | ICD-10-CM | POA: Diagnosis not present

## 2022-06-24 DIAGNOSIS — J22 Unspecified acute lower respiratory infection: Secondary | ICD-10-CM | POA: Diagnosis not present

## 2022-06-24 DIAGNOSIS — Z9889 Other specified postprocedural states: Secondary | ICD-10-CM | POA: Diagnosis not present

## 2022-06-25 DIAGNOSIS — E782 Mixed hyperlipidemia: Secondary | ICD-10-CM | POA: Diagnosis not present

## 2022-06-25 DIAGNOSIS — Z789 Other specified health status: Secondary | ICD-10-CM | POA: Diagnosis not present

## 2022-06-25 DIAGNOSIS — I2510 Atherosclerotic heart disease of native coronary artery without angina pectoris: Secondary | ICD-10-CM | POA: Diagnosis not present

## 2022-06-25 DIAGNOSIS — Z7982 Long term (current) use of aspirin: Secondary | ICD-10-CM | POA: Diagnosis not present

## 2022-06-25 DIAGNOSIS — R931 Abnormal findings on diagnostic imaging of heart and coronary circulation: Secondary | ICD-10-CM | POA: Diagnosis not present

## 2022-06-25 DIAGNOSIS — Z9049 Acquired absence of other specified parts of digestive tract: Secondary | ICD-10-CM | POA: Diagnosis not present

## 2022-06-25 DIAGNOSIS — Z9889 Other specified postprocedural states: Secondary | ICD-10-CM | POA: Diagnosis not present

## 2022-06-28 DIAGNOSIS — M25511 Pain in right shoulder: Secondary | ICD-10-CM | POA: Diagnosis not present

## 2022-06-29 DIAGNOSIS — J918 Pleural effusion in other conditions classified elsewhere: Secondary | ICD-10-CM | POA: Diagnosis not present

## 2022-06-29 DIAGNOSIS — R0600 Dyspnea, unspecified: Secondary | ICD-10-CM | POA: Diagnosis not present

## 2022-06-29 DIAGNOSIS — J4550 Severe persistent asthma, uncomplicated: Secondary | ICD-10-CM | POA: Diagnosis not present

## 2022-06-29 DIAGNOSIS — Z48815 Encounter for surgical aftercare following surgery on the digestive system: Secondary | ICD-10-CM | POA: Diagnosis not present

## 2022-06-29 DIAGNOSIS — Z8616 Personal history of COVID-19: Secondary | ICD-10-CM | POA: Diagnosis not present

## 2022-06-29 DIAGNOSIS — J181 Lobar pneumonia, unspecified organism: Secondary | ICD-10-CM | POA: Diagnosis not present

## 2022-06-29 DIAGNOSIS — K219 Gastro-esophageal reflux disease without esophagitis: Secondary | ICD-10-CM | POA: Diagnosis not present

## 2022-06-29 DIAGNOSIS — K2270 Barrett's esophagus without dysplasia: Secondary | ICD-10-CM | POA: Diagnosis not present

## 2022-06-29 DIAGNOSIS — J45909 Unspecified asthma, uncomplicated: Secondary | ICD-10-CM | POA: Diagnosis not present

## 2022-06-29 DIAGNOSIS — Z431 Encounter for attention to gastrostomy: Secondary | ICD-10-CM | POA: Diagnosis not present

## 2022-06-30 DIAGNOSIS — J181 Lobar pneumonia, unspecified organism: Secondary | ICD-10-CM | POA: Diagnosis not present

## 2022-06-30 DIAGNOSIS — J918 Pleural effusion in other conditions classified elsewhere: Secondary | ICD-10-CM | POA: Diagnosis not present

## 2022-06-30 DIAGNOSIS — Z48815 Encounter for surgical aftercare following surgery on the digestive system: Secondary | ICD-10-CM | POA: Diagnosis not present

## 2022-06-30 DIAGNOSIS — J45909 Unspecified asthma, uncomplicated: Secondary | ICD-10-CM | POA: Diagnosis not present

## 2022-06-30 DIAGNOSIS — K2270 Barrett's esophagus without dysplasia: Secondary | ICD-10-CM | POA: Diagnosis not present

## 2022-06-30 DIAGNOSIS — Z431 Encounter for attention to gastrostomy: Secondary | ICD-10-CM | POA: Diagnosis not present

## 2022-07-01 DIAGNOSIS — R079 Chest pain, unspecified: Secondary | ICD-10-CM | POA: Diagnosis not present

## 2022-07-01 DIAGNOSIS — R1319 Other dysphagia: Secondary | ICD-10-CM | POA: Diagnosis not present

## 2022-07-01 DIAGNOSIS — I700 Atherosclerosis of aorta: Secondary | ICD-10-CM | POA: Diagnosis not present

## 2022-07-01 DIAGNOSIS — N2883 Nephroptosis: Secondary | ICD-10-CM | POA: Diagnosis not present

## 2022-07-01 DIAGNOSIS — R1900 Intra-abdominal and pelvic swelling, mass and lump, unspecified site: Secondary | ICD-10-CM | POA: Diagnosis not present

## 2022-07-02 DIAGNOSIS — R1032 Left lower quadrant pain: Secondary | ICD-10-CM | POA: Diagnosis not present

## 2022-07-02 DIAGNOSIS — K219 Gastro-esophageal reflux disease without esophagitis: Secondary | ICD-10-CM | POA: Diagnosis not present

## 2022-07-02 DIAGNOSIS — I2510 Atherosclerotic heart disease of native coronary artery without angina pectoris: Secondary | ICD-10-CM | POA: Diagnosis not present

## 2022-07-02 DIAGNOSIS — Z9889 Other specified postprocedural states: Secondary | ICD-10-CM | POA: Diagnosis not present

## 2022-07-02 DIAGNOSIS — R110 Nausea: Secondary | ICD-10-CM | POA: Diagnosis not present

## 2022-07-02 DIAGNOSIS — K429 Umbilical hernia without obstruction or gangrene: Secondary | ICD-10-CM | POA: Diagnosis not present

## 2022-07-02 DIAGNOSIS — K449 Diaphragmatic hernia without obstruction or gangrene: Secondary | ICD-10-CM | POA: Diagnosis not present

## 2022-07-02 DIAGNOSIS — K2270 Barrett's esophagus without dysplasia: Secondary | ICD-10-CM | POA: Diagnosis not present

## 2022-07-02 DIAGNOSIS — Z9049 Acquired absence of other specified parts of digestive tract: Secondary | ICD-10-CM | POA: Diagnosis not present

## 2022-07-02 DIAGNOSIS — J986 Disorders of diaphragm: Secondary | ICD-10-CM | POA: Diagnosis not present

## 2022-07-03 DIAGNOSIS — R1110 Vomiting, unspecified: Secondary | ICD-10-CM | POA: Diagnosis not present

## 2022-07-03 DIAGNOSIS — K2270 Barrett's esophagus without dysplasia: Secondary | ICD-10-CM | POA: Diagnosis not present

## 2022-07-03 DIAGNOSIS — J45909 Unspecified asthma, uncomplicated: Secondary | ICD-10-CM | POA: Diagnosis not present

## 2022-07-03 DIAGNOSIS — J918 Pleural effusion in other conditions classified elsewhere: Secondary | ICD-10-CM | POA: Diagnosis not present

## 2022-07-03 DIAGNOSIS — K219 Gastro-esophageal reflux disease without esophagitis: Secondary | ICD-10-CM | POA: Diagnosis not present

## 2022-07-03 DIAGNOSIS — J181 Lobar pneumonia, unspecified organism: Secondary | ICD-10-CM | POA: Diagnosis not present

## 2022-07-03 DIAGNOSIS — Z431 Encounter for attention to gastrostomy: Secondary | ICD-10-CM | POA: Diagnosis not present

## 2022-07-03 DIAGNOSIS — R0981 Nasal congestion: Secondary | ICD-10-CM | POA: Diagnosis not present

## 2022-07-03 DIAGNOSIS — Z48815 Encounter for surgical aftercare following surgery on the digestive system: Secondary | ICD-10-CM | POA: Diagnosis not present

## 2022-07-03 DIAGNOSIS — R509 Fever, unspecified: Secondary | ICD-10-CM | POA: Diagnosis not present

## 2022-07-05 DIAGNOSIS — J181 Lobar pneumonia, unspecified organism: Secondary | ICD-10-CM | POA: Diagnosis not present

## 2022-07-05 DIAGNOSIS — K2270 Barrett's esophagus without dysplasia: Secondary | ICD-10-CM | POA: Diagnosis not present

## 2022-07-05 DIAGNOSIS — J45909 Unspecified asthma, uncomplicated: Secondary | ICD-10-CM | POA: Diagnosis not present

## 2022-07-05 DIAGNOSIS — J918 Pleural effusion in other conditions classified elsewhere: Secondary | ICD-10-CM | POA: Diagnosis not present

## 2022-07-05 DIAGNOSIS — Z431 Encounter for attention to gastrostomy: Secondary | ICD-10-CM | POA: Diagnosis not present

## 2022-07-05 DIAGNOSIS — Z48815 Encounter for surgical aftercare following surgery on the digestive system: Secondary | ICD-10-CM | POA: Diagnosis not present

## 2022-07-07 DIAGNOSIS — J22 Unspecified acute lower respiratory infection: Secondary | ICD-10-CM | POA: Diagnosis not present

## 2022-07-07 DIAGNOSIS — R195 Other fecal abnormalities: Secondary | ICD-10-CM | POA: Diagnosis not present

## 2022-07-08 DIAGNOSIS — R197 Diarrhea, unspecified: Secondary | ICD-10-CM | POA: Diagnosis not present

## 2022-07-09 DIAGNOSIS — Z431 Encounter for attention to gastrostomy: Secondary | ICD-10-CM | POA: Diagnosis not present

## 2022-07-09 DIAGNOSIS — J181 Lobar pneumonia, unspecified organism: Secondary | ICD-10-CM | POA: Diagnosis not present

## 2022-07-09 DIAGNOSIS — J918 Pleural effusion in other conditions classified elsewhere: Secondary | ICD-10-CM | POA: Diagnosis not present

## 2022-07-09 DIAGNOSIS — K2270 Barrett's esophagus without dysplasia: Secondary | ICD-10-CM | POA: Diagnosis not present

## 2022-07-09 DIAGNOSIS — J45909 Unspecified asthma, uncomplicated: Secondary | ICD-10-CM | POA: Diagnosis not present

## 2022-07-09 DIAGNOSIS — Z48815 Encounter for surgical aftercare following surgery on the digestive system: Secondary | ICD-10-CM | POA: Diagnosis not present

## 2022-07-13 ENCOUNTER — Other Ambulatory Visit: Payer: Self-pay

## 2022-07-13 ENCOUNTER — Encounter (HOSPITAL_COMMUNITY): Payer: Self-pay

## 2022-07-13 ENCOUNTER — Emergency Department (HOSPITAL_COMMUNITY): Payer: Medicare Other

## 2022-07-13 ENCOUNTER — Inpatient Hospital Stay (HOSPITAL_COMMUNITY)
Admission: EM | Admit: 2022-07-13 | Discharge: 2022-07-16 | DRG: 391 | Disposition: A | Discharge status: Home / Self Care | Payer: Medicare Other | Attending: Internal Medicine | Admitting: Internal Medicine

## 2022-07-13 DIAGNOSIS — K219 Gastro-esophageal reflux disease without esophagitis: Secondary | ICD-10-CM | POA: Diagnosis present

## 2022-07-13 DIAGNOSIS — R1013 Epigastric pain: Principal | ICD-10-CM | POA: Diagnosis present

## 2022-07-13 DIAGNOSIS — Z79899 Other long term (current) drug therapy: Secondary | ICD-10-CM | POA: Diagnosis not present

## 2022-07-13 DIAGNOSIS — E43 Unspecified severe protein-calorie malnutrition: Secondary | ICD-10-CM | POA: Diagnosis present

## 2022-07-13 DIAGNOSIS — K449 Diaphragmatic hernia without obstruction or gangrene: Secondary | ICD-10-CM | POA: Diagnosis not present

## 2022-07-13 DIAGNOSIS — K580 Irritable bowel syndrome with diarrhea: Secondary | ICD-10-CM | POA: Diagnosis present

## 2022-07-13 DIAGNOSIS — Z9109 Other allergy status, other than to drugs and biological substances: Secondary | ICD-10-CM

## 2022-07-13 DIAGNOSIS — E440 Moderate protein-calorie malnutrition: Secondary | ICD-10-CM

## 2022-07-13 DIAGNOSIS — R079 Chest pain, unspecified: Secondary | ICD-10-CM | POA: Diagnosis not present

## 2022-07-13 DIAGNOSIS — Z8249 Family history of ischemic heart disease and other diseases of the circulatory system: Secondary | ICD-10-CM

## 2022-07-13 DIAGNOSIS — Z681 Body mass index (BMI) 19 or less, adult: Secondary | ICD-10-CM

## 2022-07-13 DIAGNOSIS — Z8701 Personal history of pneumonia (recurrent): Secondary | ICD-10-CM | POA: Diagnosis not present

## 2022-07-13 DIAGNOSIS — R300 Dysuria: Secondary | ICD-10-CM | POA: Diagnosis not present

## 2022-07-13 DIAGNOSIS — Z91013 Allergy to seafood: Secondary | ICD-10-CM

## 2022-07-13 DIAGNOSIS — R109 Unspecified abdominal pain: Secondary | ICD-10-CM | POA: Diagnosis not present

## 2022-07-13 DIAGNOSIS — R112 Nausea with vomiting, unspecified: Secondary | ICD-10-CM | POA: Diagnosis not present

## 2022-07-13 DIAGNOSIS — Z820 Family history of epilepsy and other diseases of the nervous system: Secondary | ICD-10-CM | POA: Diagnosis not present

## 2022-07-13 DIAGNOSIS — Z881 Allergy status to other antibiotic agents status: Secondary | ICD-10-CM

## 2022-07-13 DIAGNOSIS — J9811 Atelectasis: Secondary | ICD-10-CM | POA: Diagnosis not present

## 2022-07-13 DIAGNOSIS — Z91041 Radiographic dye allergy status: Secondary | ICD-10-CM | POA: Diagnosis not present

## 2022-07-13 DIAGNOSIS — K2270 Barrett's esophagus without dysplasia: Secondary | ICD-10-CM | POA: Diagnosis present

## 2022-07-13 DIAGNOSIS — R9431 Abnormal electrocardiogram [ECG] [EKG]: Secondary | ICD-10-CM | POA: Diagnosis not present

## 2022-07-13 DIAGNOSIS — E785 Hyperlipidemia, unspecified: Secondary | ICD-10-CM | POA: Diagnosis present

## 2022-07-13 DIAGNOSIS — I700 Atherosclerosis of aorta: Secondary | ICD-10-CM | POA: Diagnosis not present

## 2022-07-13 DIAGNOSIS — Z7982 Long term (current) use of aspirin: Secondary | ICD-10-CM

## 2022-07-13 DIAGNOSIS — R197 Diarrhea, unspecified: Secondary | ICD-10-CM

## 2022-07-13 NOTE — ED Notes (Signed)
Would like port to be accessed for labs

## 2022-07-13 NOTE — ED Triage Notes (Signed)
Pt reports with upper abdominal pain that extends into his mid chest since earlier today. Pt reports being diagnosed with PNA 1 week ago.

## 2022-07-14 ENCOUNTER — Other Ambulatory Visit: Payer: Self-pay

## 2022-07-14 ENCOUNTER — Emergency Department (HOSPITAL_COMMUNITY): Payer: Medicare Other

## 2022-07-14 DIAGNOSIS — E43 Unspecified severe protein-calorie malnutrition: Secondary | ICD-10-CM | POA: Diagnosis present

## 2022-07-14 DIAGNOSIS — Z681 Body mass index (BMI) 19 or less, adult: Secondary | ICD-10-CM | POA: Diagnosis not present

## 2022-07-14 DIAGNOSIS — E785 Hyperlipidemia, unspecified: Secondary | ICD-10-CM | POA: Diagnosis present

## 2022-07-14 DIAGNOSIS — R109 Unspecified abdominal pain: Secondary | ICD-10-CM

## 2022-07-14 DIAGNOSIS — R197 Diarrhea, unspecified: Secondary | ICD-10-CM

## 2022-07-14 DIAGNOSIS — Z8701 Personal history of pneumonia (recurrent): Secondary | ICD-10-CM | POA: Diagnosis not present

## 2022-07-14 DIAGNOSIS — Z881 Allergy status to other antibiotic agents status: Secondary | ICD-10-CM | POA: Diagnosis not present

## 2022-07-14 DIAGNOSIS — Z91041 Radiographic dye allergy status: Secondary | ICD-10-CM | POA: Diagnosis not present

## 2022-07-14 DIAGNOSIS — Z8249 Family history of ischemic heart disease and other diseases of the circulatory system: Secondary | ICD-10-CM | POA: Diagnosis not present

## 2022-07-14 DIAGNOSIS — Z9109 Other allergy status, other than to drugs and biological substances: Secondary | ICD-10-CM | POA: Diagnosis not present

## 2022-07-14 DIAGNOSIS — Z79899 Other long term (current) drug therapy: Secondary | ICD-10-CM | POA: Diagnosis not present

## 2022-07-14 DIAGNOSIS — Z7982 Long term (current) use of aspirin: Secondary | ICD-10-CM | POA: Diagnosis not present

## 2022-07-14 DIAGNOSIS — R1013 Epigastric pain: Secondary | ICD-10-CM

## 2022-07-14 DIAGNOSIS — K580 Irritable bowel syndrome with diarrhea: Secondary | ICD-10-CM | POA: Diagnosis present

## 2022-07-14 DIAGNOSIS — Z820 Family history of epilepsy and other diseases of the nervous system: Secondary | ICD-10-CM | POA: Diagnosis not present

## 2022-07-14 DIAGNOSIS — Z91013 Allergy to seafood: Secondary | ICD-10-CM | POA: Diagnosis not present

## 2022-07-14 DIAGNOSIS — R112 Nausea with vomiting, unspecified: Secondary | ICD-10-CM | POA: Diagnosis present

## 2022-07-14 DIAGNOSIS — K2270 Barrett's esophagus without dysplasia: Secondary | ICD-10-CM | POA: Diagnosis present

## 2022-07-14 DIAGNOSIS — I700 Atherosclerosis of aorta: Secondary | ICD-10-CM | POA: Diagnosis not present

## 2022-07-14 DIAGNOSIS — R300 Dysuria: Secondary | ICD-10-CM | POA: Diagnosis not present

## 2022-07-14 DIAGNOSIS — K219 Gastro-esophageal reflux disease without esophagitis: Secondary | ICD-10-CM | POA: Diagnosis present

## 2022-07-14 LAB — CBC
HCT: 41.4 % (ref 39.0–52.0)
Hemoglobin: 14.1 g/dL (ref 13.0–17.0)
MCH: 31.9 pg (ref 26.0–34.0)
MCHC: 34.1 g/dL (ref 30.0–36.0)
MCV: 93.7 fL (ref 80.0–100.0)
Platelets: 191 10*3/uL (ref 150–400)
RBC: 4.42 MIL/uL (ref 4.22–5.81)
RDW: 13.2 % (ref 11.5–15.5)
WBC: 9 10*3/uL (ref 4.0–10.5)
nRBC: 0 % (ref 0.0–0.2)

## 2022-07-14 LAB — HEPATIC FUNCTION PANEL
ALT: 68 U/L — ABNORMAL HIGH (ref 0–44)
AST: 35 U/L (ref 15–41)
Albumin: 2.8 g/dL — ABNORMAL LOW (ref 3.5–5.0)
Alkaline Phosphatase: 38 U/L (ref 38–126)
Bilirubin, Direct: 0.2 mg/dL (ref 0.0–0.2)
Indirect Bilirubin: 0.4 mg/dL (ref 0.3–0.9)
Total Bilirubin: 0.6 mg/dL (ref 0.3–1.2)
Total Protein: 5 g/dL — ABNORMAL LOW (ref 6.5–8.1)

## 2022-07-14 LAB — BASIC METABOLIC PANEL
Anion gap: 5 (ref 5–15)
BUN: 22 mg/dL — ABNORMAL HIGH (ref 6–20)
CO2: 25 mmol/L (ref 22–32)
Calcium: 8.1 mg/dL — ABNORMAL LOW (ref 8.9–10.3)
Chloride: 107 mmol/L (ref 98–111)
Creatinine, Ser: 0.86 mg/dL (ref 0.61–1.24)
GFR, Estimated: >60 mL/min (ref >60)
Glucose, Bld: 118 mg/dL — ABNORMAL HIGH (ref 70–99)
Potassium: 3.9 mmol/L (ref 3.5–5.1)
Sodium: 137 mmol/L (ref 135–145)

## 2022-07-14 LAB — TROPONIN I (HIGH SENSITIVITY)
Troponin I (High Sensitivity): 10 ng/L (ref <18)
Troponin I (High Sensitivity): 10 ng/L (ref <18)

## 2022-07-14 LAB — LIPASE, BLOOD: Lipase: 25 U/L (ref 11–51)

## 2022-07-14 MED ORDER — VITAMIN B-12 1000 MCG PO TABS
1000.0000 ug | ORAL_TABLET | Freq: Every day | ORAL | Status: DC
  Administered 2022-07-14 – 2022-07-16 (×3): 1000 ug via ORAL
  Filled 2022-07-14 (×2): qty 1
  Filled 2022-07-14 (×2): qty 2

## 2022-07-14 MED ORDER — SUCRALFATE 1 G PO TABS
1.0000 g | ORAL_TABLET | Freq: Three times a day (TID) | ORAL | Status: DC
  Administered 2022-07-14 – 2022-07-16 (×8): 1 g via ORAL
  Filled 2022-07-14 (×8): qty 1

## 2022-07-14 MED ORDER — ACETAMINOPHEN 325 MG PO TABS: 650.0000 mg | ORAL_TABLET | Freq: Four times a day (QID) | ORAL | Status: DC | PRN

## 2022-07-14 MED ORDER — TRAZODONE HCL 50 MG PO TABS
50.0000 mg | ORAL_TABLET | Freq: Every day | ORAL | Status: DC
  Administered 2022-07-14 – 2022-07-15 (×2): 50 mg via ORAL
  Filled 2022-07-14 (×2): qty 1

## 2022-07-14 MED ORDER — ASPIRIN 81 MG PO TBEC
81.0000 mg | DELAYED_RELEASE_TABLET | Freq: Every day | ORAL | Status: DC
  Administered 2022-07-14 – 2022-07-15 (×2): 81 mg via ORAL
  Filled 2022-07-14 (×2): qty 1

## 2022-07-14 MED ORDER — CHOLECALCIFEROL 10 MCG (400 UNIT) PO TABS
400.0000 [IU] | ORAL_TABLET | Freq: Every day | ORAL | Status: DC
  Administered 2022-07-14 – 2022-07-16 (×3): 400 [IU] via ORAL
  Filled 2022-07-14 (×3): qty 1

## 2022-07-14 MED ORDER — IPRATROPIUM BROMIDE 0.06 % NA SOLN
1.0000 | Freq: Every day | NASAL | Status: DC | PRN
Start: 2022-07-14 — End: 2022-07-14

## 2022-07-14 MED ORDER — CHLORHEXIDINE GLUCONATE CLOTH 2 % EX PADS
6.0000 | MEDICATED_PAD | Freq: Every day | CUTANEOUS | Status: DC
  Administered 2022-07-15 – 2022-07-16 (×2): 6 via TOPICAL

## 2022-07-14 MED ORDER — ADULT MULTIVITAMIN W/MINERALS CH
1.0000 | ORAL_TABLET | Freq: Every day | ORAL | Status: DC
  Administered 2022-07-14 – 2022-07-16 (×3): 1 via ORAL
  Filled 2022-07-14 (×3): qty 1

## 2022-07-14 MED ORDER — PANTOPRAZOLE SODIUM 40 MG IV SOLR
40.0000 mg | Freq: Once | INTRAVENOUS | Status: AC
  Administered 2022-07-14: 40 mg via INTRAVENOUS
  Filled 2022-07-14: qty 10

## 2022-07-14 MED ORDER — ROSUVASTATIN CALCIUM 20 MG PO TABS
20.0000 mg | ORAL_TABLET | Freq: Every day | ORAL | Status: DC
  Administered 2022-07-14 – 2022-07-15 (×2): 20 mg via ORAL
  Filled 2022-07-14 (×2): qty 1

## 2022-07-14 MED ORDER — MORPHINE SULFATE (PF) 4 MG/ML IV SOLN
4.0000 mg | Freq: Once | INTRAVENOUS | Status: AC
  Administered 2022-07-14: 4 mg via INTRAVENOUS
  Filled 2022-07-14: qty 1

## 2022-07-14 MED ORDER — LACTATED RINGERS IV SOLN
INTRAVENOUS | Status: DC
Start: 2022-07-14 — End: 2022-07-16

## 2022-07-14 MED ORDER — MONTELUKAST SODIUM 10 MG PO TABS: 10.0000 mg | ORAL_TABLET | Freq: Every day | ORAL | Status: DC | PRN

## 2022-07-14 MED ORDER — ENOXAPARIN SODIUM 40 MG/0.4ML IJ SOSY
40.0000 mg | PREFILLED_SYRINGE | INTRAMUSCULAR | Status: DC
  Administered 2022-07-14 – 2022-07-15 (×2): 40 mg via SUBCUTANEOUS
  Filled 2022-07-14 (×2): qty 0.4

## 2022-07-14 MED ORDER — HYDROMORPHONE HCL 2 MG/ML IJ SOLN
1.0000 mg | Freq: Once | INTRAMUSCULAR | Status: AC
  Administered 2022-07-14: 1 mg via INTRAVENOUS
  Filled 2022-07-14: qty 1

## 2022-07-14 MED ORDER — PANTOPRAZOLE SODIUM 40 MG IV SOLR
40.0000 mg | Freq: Two times a day (BID) | INTRAVENOUS | Status: DC
  Administered 2022-07-14 – 2022-07-15 (×3): 40 mg via INTRAVENOUS
  Filled 2022-07-14 (×3): qty 10

## 2022-07-14 MED ORDER — KETOROLAC TROMETHAMINE 30 MG/ML IJ SOLN
30.0000 mg | Freq: Once | INTRAMUSCULAR | Status: AC
  Administered 2022-07-14: 30 mg via INTRAVENOUS
  Filled 2022-07-14: qty 1

## 2022-07-14 MED ORDER — PROCHLORPERAZINE EDISYLATE 10 MG/2ML IJ SOLN
10.0000 mg | Freq: Four times a day (QID) | INTRAMUSCULAR | Status: DC | PRN
  Administered 2022-07-15: 10 mg via INTRAVENOUS
  Filled 2022-07-14: qty 2

## 2022-07-14 MED ORDER — ACETAMINOPHEN 650 MG RE SUPP: 650.0000 mg | Freq: Four times a day (QID) | RECTAL | Status: DC | PRN

## 2022-07-14 MED ORDER — MAGNESIUM OXIDE -MG SUPPLEMENT 400 (240 MG) MG PO TABS
400.0000 mg | ORAL_TABLET | Freq: Every day | ORAL | Status: DC
  Administered 2022-07-14 – 2022-07-16 (×3): 400 mg via ORAL
  Filled 2022-07-14 (×3): qty 1

## 2022-07-14 MED ORDER — PROCHLORPERAZINE EDISYLATE 10 MG/2ML IJ SOLN
10.0000 mg | Freq: Once | INTRAMUSCULAR | Status: AC
  Administered 2022-07-14: 10 mg via INTRAVENOUS
  Filled 2022-07-14: qty 2

## 2022-07-14 MED ORDER — ALBUTEROL SULFATE (2.5 MG/3ML) 0.083% IN NEBU
2.5000 mg | INHALATION_SOLUTION | RESPIRATORY_TRACT | Status: DC | PRN
Start: 2022-07-14 — End: 2022-07-16
  Administered 2022-07-15: 2.5 mg via RESPIRATORY_TRACT
  Filled 2022-07-14: qty 3

## 2022-07-14 MED ORDER — CYCLOBENZAPRINE HCL 5 MG PO TABS
10.0000 mg | ORAL_TABLET | Freq: Three times a day (TID) | ORAL | Status: DC | PRN
Start: 2022-07-14 — End: 2022-07-16
  Administered 2022-07-14 – 2022-07-16 (×4): 10 mg via ORAL
  Filled 2022-07-14 (×4): qty 2

## 2022-07-14 MED ORDER — HYDROMORPHONE HCL 2 MG/ML IJ SOLN
1.0000 mg | INTRAMUSCULAR | Status: DC | PRN
  Administered 2022-07-14 – 2022-07-16 (×9): 1 mg via INTRAVENOUS
  Filled 2022-07-14 (×9): qty 1

## 2022-07-14 MED ORDER — LACTATED RINGERS IV BOLUS
1000.0000 mL | Freq: Once | INTRAVENOUS | Status: AC
  Administered 2022-07-14: 1000 mL via INTRAVENOUS

## 2022-07-14 MED ORDER — SODIUM CHLORIDE 0.9 % IV SOLN
25.0000 mg | Freq: Four times a day (QID) | INTRAVENOUS | Status: DC | PRN
  Administered 2022-07-14 – 2022-07-15 (×4): 25 mg via INTRAVENOUS
  Filled 2022-07-14 (×2): qty 25
  Filled 2022-07-14: qty 1
  Filled 2022-07-14: qty 25

## 2022-07-14 MED ORDER — OXYCODONE HCL 5 MG PO TABS
5.0000 mg | ORAL_TABLET | ORAL | Status: DC | PRN
  Administered 2022-07-14 (×2): 5 mg via ORAL
  Filled 2022-07-14 (×2): qty 1

## 2022-07-14 NOTE — Consult Note (Signed)
UNASSIGNED PATIENT Reason for Consult: Epigastric pain, chest pain with nausea Referring Physician: [facility] hospitalist.  Edwin Hall is an 56 y.o. male.  HPI: Edwin Hall is a 56 year old white male with multiple medical problems listed below who was [facility] the [facility] to the emergency room with unrelenting chest pain epigastric pain and unrelenting nausea.  Presently he claims his pain is 8 out of 10.  His history is quite complicated on reviewing care everywhere, he has had an esophagectomy for what appears to be Barrett's esophagus and has had hiatal hernia repair he claims he is recently finished a course of antibiotics for pneumonia. He took Augmentin and Flagyl for the last 10 days he denies any fever chills or rigors at this time. He has had poor p.o. intake and is scheduled to see his gastroenterologist at [facility] next week. His primary gastroenterologist in [place] where he lives is Dr. Charm BargesButler. As he has several loose bowel movements today stool studies are being done to rule out C. difficile colitis.  Scan of the abdomen pelvis done on admission revealed stable postoperative changes with no acute findings. There is evidence of prior cholecystectomy aortic atherosclerosis and eventration of the left hemidiaphragm.  Past Medical History:  Diagnosis Date   Complication of anesthesia    nasuea and vomiting   Food allergy    Headache    Hiatal hernia    Hyperlipidemia    IBS (irritable bowel syndrome)    Iron deficiency anemia    Urticaria    Past Surgical History:  Procedure Laterality Date   ABDOMINAL SURGERY  12/2015   pylorus plasty   APPENDECTOMY  1984   BIOPSY  05/18/2020   Procedure: BIOPSY;  Surgeon: Kerin SalenKarki, Arya, MD;  Location: [facility];  Service: Gastroenterology;;   CHOLECYSTECTOMY  2008   ESOPHAGOGASTRODUODENOSCOPY (EGD) WITH PROPOFOL N/A 05/18/2020   Procedure: ESOPHAGOGASTRODUODENOSCOPY (EGD) WITH PROPOFOL;  Surgeon: Kerin SalenKarki, Arya, MD;   Location: [facility] ENDOS[facility];  Service: Gastroenterology;  Laterality: N/A;   GASTROSTOMY TUBE PLACEMENT     HIATAL HERNIA REPAIR  07/2015   TALC PLEURODESIS     TONSILLECTOMY  1980   VENTRAL HERNIA REPAIR  2010   Family History  Problem Relation Age of Onset   Parkinson's disease Father    Heart disease Father    Angioedema Mother    Allergic rhinitis Neg Hx    Asthma Neg Hx    Eczema Neg Hx    Immunodeficiency Neg Hx    Urticaria Neg Hx    Social History:  reports that he has never smoked. He has never used smokeless tobacco. He reports that he does not drink alcohol and does not use drugs. 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  He lives alone in [place].  Allergies:  Allergies  Allergen Reactions   Contrast Media [Iodinated Contrast Media] Hives   Mushroom Extract Complex Anaphylaxis and Hives   Other Anaphylaxis    All seafood    Shellfish Allergy Anaphylaxis   Reglan [Metoclopramide] Hives    Severe itching all over   Iodine Hives   Tetracyclines & Related Itching   Zofran [Ondansetron Hcl] Itching and Rash    Medications: I have reviewed the patient's current medications. Prior to Admission:  Medications Prior to Admission  Medication Sig Dispense Refill Last Dose   acetaminophen (TYLENOL) 500 MG tablet Take 1,000 mg by mouth every 6 (six) hours as needed for moderate  pain.   unk   aspirin 81 MG EC tablet Take 81 mg by mouth at bedtime. Swallow whole.   07/12/2022   chlorpheniramine-HYDROcodone (TUSSIONEX) 10-8 MG/5ML Take 5 mLs by mouth every 12 (twelve) hours as needed for cough.   Past Week   Cholecalciferol (VITAMIN D3) 10 MCG (400 UNIT) CAPS Take 400 Units by mouth daily.   07/13/2022   Cyanocobalamin (VITAMIN B 12 PO) Take 1,000 mcg by mouth daily.   07/13/2022   cyclobenzaprine (FLEXERIL) 10 MG tablet Take 10 mg by mouth 3 (three) times daily as needed for muscle spasms.   07/13/2022   famotidine (PEPCID) 40 MG tablet Take 40 mg by  mouth 2 (two) times daily.   07/13/2022   Ferrous Sulfate (IRON) 325 (65 Fe) MG TABS Take 325 mg by mouth every other day.    07/12/2022   ipratropium (ATROVENT) 0.06 % nasal spray USE TWO SPRAYS IN EACH NOSTRIL TWICE DAILY AS DIRECTED. (Patient taking differently: Place 1 spray into both nostrils daily as needed for rhinitis.) 15 mL 0 unk   LINZESS 290 MCG CAPS capsule Take 290 mcg by mouth daily as needed (abdominal cramping).    unk   Magnesium 250 MG TABS Take 250 mg by mouth daily.   07/13/2022   metoCLOPramide (REGLAN) 10 MG tablet Take 10 mg by mouth 2 (two) times daily before a meal.   07/13/2022   montelukast (SINGULAIR) 10 MG tablet Take 10 mg by mouth daily as needed (allergies).   unk   Multiple Vitamin (MULTIVITAMIN WITH MINERALS) TABS tablet Take 1 tablet by mouth daily.   07/13/2022   pantoprazole (PROTONIX) 40 MG tablet Take 40 mg by mouth 2 (two) times daily.   07/13/2022   PROAIR DIGIHALER 108 (90 Base) MCG/ACT AEPB Take 2 puffs by mouth every 4 (four) hours as needed (sob/wheezing).   07/13/2022   Probiotic Product (PROBIOTIC ADVANCED) CAPS Take 1 capsule by mouth 2 (two) times daily.   07/13/2022   promethazine (PHENERGAN) 25 MG tablet Take 25 mg by mouth every 6 (six) hours as needed for nausea or vomiting.   07/13/2022   rosuvastatin (CRESTOR) 20 MG tablet Take 20 mg by mouth at bedtime.   07/12/2022   sucralfate (CARAFATE) 1 g tablet Take 1 tablet (1 g total) by mouth 4 (four) times daily -  with meals and at bedtime. 42 tablet 0 07/13/2022   traZODone (DESYREL) 50 MG tablet Take 50 mg by mouth at bedtime.   07/12/2022   Zinc 50 MG TABS Take 50 mg by mouth daily.    07/13/2022   amoxicillin-clavulanate (AUGMENTIN) 875-125 MG tablet Take 1 tablet by mouth 2 (two) times daily. Finished 07-13-22      Scheduled:  aspirin EC  81 mg Oral QHS   cholecalciferol  400 Units Oral Daily   cyanocobalamin  1,000 mcg Oral Daily   enoxaparin (LOVENOX) injection  40 mg Subcutaneous Q24H   magnesium  oxide  400 mg Oral Daily   multivitamin with minerals  1 tablet Oral Daily   pantoprazole (PROTONIX) IV  40 mg Intravenous Q12H   rosuvastatin  20 mg Oral QHS   sucralfate  1 g Oral TID WC & HS   traZODone  50 mg Oral QHS   Continuous:  lactated ringers 100 mL/hr at 07/14/22 1109   promethazine (PHENERGAN) injection (IM or IVPB) 25 mg (07/14/22 1742)    Results for orders placed or performed during the hospital encounter of 07/13/22 (from the  past 48 hour(s))  Basic metabolic panel     Status: Abnormal   Collection Time: 07/14/22 12:10 AM  Result Value Ref Range   Sodium 137 135 - 145 mmol/L   Potassium 3.9 3.5 - 5.1 mmol/L   Chloride 107 98 - 111 mmol/L   CO2 25 22 - 32 mmol/L   Glucose, Bld 118 (H) 70 - 99 mg/dL    Comment: Glucose reference range applies only to samples taken after fasting for at least 8 hours.   BUN 22 (H) 6 - 20 mg/dL   Creatinine, Ser 8.31 0.61 - 1.24 mg/dL   Calcium 8.1 (L) 8.9 - 10.3 mg/dL   GFR, Estimated >51 >76 mL/min    Comment: (NOTE) Calculated using the CKD-EPI Creatinine Equation (2021)    Anion gap 5 5 - 15    Comment: Performed at [facility], [address] [place], [place] [address]  CBC     Status: None   Collection Time: 07/14/22 12:10 AM  Result Value Ref Range   WBC 9.0 4.0 - 10.5 K/uL   RBC 4.42 4.22 - 5.81 MIL/uL   Hemoglobin 14.1 13.0 - 17.0 g/dL   HCT 71.0 62.6 - 94.8 %   MCV 93.7 80.0 - 100.0 fL   MCH 31.9 26.0 - 34.0 pg   MCHC 34.1 30.0 - 36.0 g/dL   RDW 54.6 27.0 - 35.0 %   Platelets 191 150 - 400 K/uL   nRBC 0.0 0.0 - 0.2 %    Comment: Performed at [facility], [address] [place], [place] [address]  Troponin I (High Sensitivity)     Status: None   Collection Time: 07/14/22 12:10 AM  Result Value Ref Range   Troponin I (High Sensitivity) 10 <18 ng/L    Comment: (NOTE) Elevated high sensitivity troponin I (hsTnI) values and significant  changes across serial measurements may  suggest ACS but many other  chronic and acute conditions are known to elevate hsTnI results.  Refer to the "Links" section for chest pain algorithms and additional  guidance. Performed at [facility], [address], [place], [place] [address]   Troponin I (High Sensitivity)     Status: None   Collection Time: 07/14/22  2:38 AM  Result Value Ref Range   Troponin I (High Sensitivity) 10 <18 ng/L    Comment: (NOTE) Elevated high sensitivity troponin I (hsTnI) values and significant  changes across serial measurements may suggest ACS but many other  chronic and acute conditions are known to elevate hsTnI results.  Refer to the "Links" section for chest pain algorithms and additional  guidance. Performed at [facility], [address], [place], [place] [address]   Hepatic function panel     Status: Abnormal   Collection Time: 07/14/22  2:38 AM  Result Value Ref Range   Total Protein 5.0 (L) 6.5 - 8.1 g/dL   Albumin 2.8 (L) 3.5 - 5.0 g/dL   AST 35 15 - 41 U/L   ALT 68 (H) 0 - 44 U/L   Alkaline Phosphatase 38 38 - 126 U/L   Total Bilirubin 0.6 0.3 - 1.2 mg/dL   Bilirubin, Direct 0.2 0.0 - 0.2 mg/dL   Indirect Bilirubin 0.4 0.3 - 0.9 mg/dL    Comment: Performed at [facility], [address], [place], [place] [address]  Lipase, blood     Status: None   Collection Time: 07/14/22  2:38 AM  Result  Value Ref Range   Lipase 25 11 - 51 U/L    Comment: Performed at [facility], [address], [place], [place] [address]    CT ABDOMEN PELVIS WO CONTRAST  Result Date: 07/14/2022 CLINICAL DATA:  Upper abdominal pain. EXAM: CT ABDOMEN AND PELVIS WITHOUT CONTRAST TECHNIQUE: Multidetector CT imaging of the abdomen and pelvis was performed following the standard protocol without IV contrast. RADIATION DOSE REDUCTION: This exam was performed according to the departmental dose-optimization program which includes  automated exposure control, adjustment of the mA and/or kV according to patient size and/or use of iterative reconstruction technique. COMPARISON:  July 01, 2022 FINDINGS: Lower chest: Surgical sutures in linear scarring are seen within the posterior aspect of the left lung base. 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 Hepatobiliary: No focal liver abnormality is seen. Status post cholecystectomy. No biliary dilatation. Pancreas: Unremarkable. No pancreatic ductal dilatation or surrounding inflammatory changes. Spleen: Normal in size without focal abnormality. Adrenals/Urinary Tract: Adrenal glands are unremarkable. Kidneys are normal, without renal calculi, focal lesion, or hydronephrosis. The urinary bladder is poorly distended and is otherwise unremarkable. Stomach/Bowel: Stable postoperative changes are seen involving the esophagus, stomach and gastroesophageal junction. The appendix is surgically absent. No evidence of bowel wall thickening, distention, or inflammatory changes. Vascular/Lymphatic: Aortic atherosclerosis. No enlarged abdominal or pelvic lymph nodes. Reproductive: Prostate is unremarkable. Other: Small bilateral inguinal hernias are noted. No abdominopelvic ascites. Musculoskeletal: No acute or significant osseous findings. IMPRESSION: 1. Stable postoperative changes involving the esophagus, stomach and gastroesophageal junction. 2. Postoperative changes within the left lung base with adjacent eventration of the left hemidiaphragm. 3. Evidence of prior cholecystectomy. 4. Aortic atherosclerosis. Aortic Atherosclerosis (ICD10-I70.0). Electronically Signed   By: Aram Candela M.D.   On: 07/14/2022 03:05   DG Chest 2 View  Result Date: 07/13/2022 CLINICAL DATA:  Chest pain.  Port-A-Cath in place. EXAM: CHEST - 2 VIEW COMPARISON:  CT chest dated February 09, 2022 FINDINGS: The heart is normal in size. Chronic elevation of the left hemidiaphragm with left basilar  atelectasis and small hiatal hernia. Lungs are clear without evidence focal consolidation or pleural effusion. Right IJ access MediPort with distal tip in the SVC, unchanged. Surgical clips in the left upper quadrant. IMPRESSION: 1.  No acute cardiopulmonary process. 2. Chronic elevation of the left hemidiaphragm with left basilar atelectasis as well as postsurgical changes in the left upper quadrant. Small hiatal hernia. 3.  Right IJ access MediPort with distal tip in the SVC. Electronically Signed   By: Larose Hires D.O.   On: 07/13/2022 23:24    Review of Systems  Constitutional:  Positive for activity change, appetite change and fatigue. Negative for chills, diaphoresis and unexpected weight change.  HENT: Negative.    Eyes: Negative.   Respiratory:  Positive for chest tightness. Negative for apnea, cough, choking, shortness of breath, wheezing and stridor.   Cardiovascular:  Positive for chest pain. Negative for palpitations and leg swelling.  Gastrointestinal:  Positive for abdominal pain, diarrhea, nausea and vomiting. Negative for anal bleeding, blood in stool, constipation and rectal pain.  Genitourinary: Negative.   Skin: Negative.   Allergic/Immunologic: Negative.   Neurological: Negative.   Psychiatric/Behavioral: Negative.     Blood pressure 128/76, pulse 93, temperature 98.3 F (36.8 C), temperature source Oral, resp. rate 17, height 5\' 8"  (1.727 m), weight 57.2 kg, SpO2 97 %. Physical Exam Constitutional:      General: He is not in acute distress.  Appearance: He is well-developed. He is not ill-appearing.  HENT:     Head: Normocephalic and atraumatic.  Cardiovascular:     Rate and Rhythm: Normal rate and regular rhythm.     Heart sounds: Normal heart sounds.  Pulmonary:     Effort: Pulmonary effort is normal.     Breath sounds: Normal breath sounds.  Abdominal:     General: There is abdominal bruit.     Palpations: Abdomen is soft. There is no fluid wave or  splenomegaly.     Tenderness: There is abdominal tenderness. There is no rebound.  Musculoskeletal:     Cervical back: Neck supple.  Skin:    General: Skin is warm and dry.  Neurological:     Mental Status: He is alert and oriented to person, place, and time.   Assessment/Plan: 1) Epigastric pain/chest pain with nausea and vomiting.  Patient gives a history of having an EGD done by Dr. Charm Barges 2 weeks ago I do not think there is much to offer from a GI standpoint here.  Patient has been been encouraged to keep his appointment at [facility] so that further GI intervention can be undertaken.  His history is complicated and as he had a recent endoscopy I do not feel the need to do another endoscopy at this time. 2) Chronic GERD and Barrett's esophagus status post Ivor Lewis esophagectomy when hiatal hernia repair. 3) Diarrhea being ruled out for C. Difficile. 4) Protein calorie malnutrition. 5) Hyperlipidemia.  Charna Elizabeth 07/14/2022, 12:43 PM

## 2022-07-14 NOTE — ED Notes (Signed)
Pt reporting epigastric pain that is 10/10. Writer obtained new EKG and exported into EPIC as well as placed at physician's desk and notified Preston Fleeting, MD of symptomatic changes.

## 2022-07-14 NOTE — ED Provider Notes (Signed)
[facility]-EMERGENCY DEPT Provider Note   CSN: [id number] Arrival date & time: 07/13/22  2248     History  Chief Complaint  Patient presents with   Chest Pain   Abdominal Pain    Edwin Hall is a 56 y.o. male.  The history is provided by the patient.  Chest Pain Associated symptoms: abdominal pain   Abdominal Pain Associated symptoms: chest pain   He has history of hypertension, hyperlipidemia, irritable bowel syndrome, esophagectomy and comes in because of epigastric and lower chest pain with some radiation to the back.  He was diagnosed with pneumonia about 10 days ago and prescribed amoxicillin-clavulanic acid as well as metronidazole.  He developed diarrhea and about 1 week ago his primary care provider had him discontinue the amoxicillin-clavulanic acid but he continue the metronidazole.  His cough is generally improved, but he started having epigastric pain about 2 days ago and it has been getting worse.  There has been associated nausea and vomiting as well as ongoing diarrhea.  He is concerned he might have C. difficile.  He denies fever, chills, sweats.  He is concerned that he has gotten dehydrated.   Home Medications Prior to Admission medications   Medication Sig Start Date End Date Taking? Authorizing Provider  acetaminophen (TYLENOL) 500 MG tablet Take 1,000 mg by mouth every 6 (six) hours as needed for moderate pain.    [provider]  aspirin 81 MG EC tablet Take 81 mg by mouth daily. Swallow whole.    [provider]  Cholecalciferol (VITAMIN D3 PO) Take 250 mcg by mouth daily.     [provider]  Cyanocobalamin (VITAMIN B 12 PO) Take 1 capsule by mouth daily.    [provider]  cyclobenzaprine (FLEXERIL) 10 MG tablet Take 10 mg by mouth 3 (three) times daily as needed. 07/05/22   [provider]  dicyclomine (BENTYL) 20 MG tablet Take 1 tablet (20 mg total) by mouth 2 (two) times daily. Patient  not taking: Reported on 02/09/2022 07/03/21   Dartha Lodge, PA-C  famotidine (PEPCID) 40 MG tablet Take 40 mg by mouth 2 (two) times daily. 06/21/22   [provider]  Ferrous Sulfate (IRON) 325 (65 Fe) MG TABS Take 325 mg by mouth every other day.  05/12/20   [provider]  gabapentin (NEURONTIN) 300 MG capsule Take 300 mg by mouth daily.  03/29/20   [provider]  Ginkgo Biloba 40 MG TABS Take 40 mg by mouth daily.     [provider]  HYDROcodone-homatropine (HYCODAN) 5-1.5 MG/5ML syrup Take 5 mLs by mouth every 6 (six) hours as needed for cough. Patient not taking: Reported on 02/09/2022 03/10/21   Charlynne Pander, MD  ipratropium (ATROVENT) 0.06 % nasal spray USE TWO SPRAYS IN EACH NOSTRIL TWICE DAILY AS DIRECTED. Patient taking differently: Place 1 spray into both nostrils daily as needed for rhinitis. 05/22/20   Marcelyn Bruins, MD  LINZESS 290 MCG CAPS capsule Take 290 mcg by mouth daily as needed (abdominal cramping).  07/03/20   [provider]  metoCLOPramide (REGLAN) 10 MG tablet Take 10 mg by mouth 3 (three) times daily. 06/30/22   [provider]  mirtazapine (REMERON) 15 MG tablet Take 15 mg by mouth at bedtime. 02/08/20   [provider]  montelukast (SINGULAIR) 10 MG tablet Take 10 mg by mouth daily as needed (allergies). 12/11/21   [provider]  Multiple Vitamin (MULTIVITAMIN WITH MINERALS)  TABS tablet Take 1 tablet by mouth daily.    [provider]  ondansetron (ZOFRAN-ODT) 4 MG disintegrating tablet Take 4 mg by mouth daily as needed. 07/02/22   [provider]  oxyCODONE (OXY IR/ROXICODONE) 5 MG immediate release tablet Take 1 tablet (5 mg total) by mouth every 6 (six) hours as needed for up to 10 doses for severe pain. Patient not taking: Reported on 02/09/2022 05/20/20   Lanae Boast, MD  pantoprazole (PROTONIX) 40 MG tablet Take 40 mg by mouth daily.    [provider]   PROAIR DIGIHALER 108 [phone] Base) MCG/ACT AEPB Take 2 puffs by mouth every 4 (four) hours as needed (sob/wheezing). 09/22/21   [provider]  promethazine (PHENERGAN) 25 MG tablet Take 25 mg by mouth every 6 (six) hours as needed for nausea or vomiting.    [provider]  rosuvastatin (CRESTOR) 20 MG tablet Take 20 mg by mouth daily.    [provider]  sucralfate (CARAFATE) 1 g tablet Take 1 tablet (1 g total) by mouth 4 (four) times daily -  with meals and at bedtime. Patient not taking: Reported on 02/09/2022 10/17/20   Tilden Fossa, MD  Zinc 50 MG TABS Take 50 mg by mouth daily.     [provider]  azelastine (ASTELIN) 0.1 % nasal spray 2 sprays per nostril twice a day for control of nasal drainage. Patient not taking: Reported on 05/02/2020 11/27/19 05/02/20  Marcelyn Bruins, MD      Allergies    Contrast media [iodinated contrast media], Other, Shellfish allergy, Reglan [metoclopramide], Iodine, Tetracyclines & related, and Zofran [ondansetron hcl]    Review of Systems   Review of Systems  Cardiovascular:  Positive for chest pain.  Gastrointestinal:  Positive for abdominal pain.  All other systems reviewed and are negative.   Physical Exam Updated Vital Signs BP (!) 134/93   Pulse 78   Temp 98.6 F (37 C) (Oral)   Resp 19   Ht 5\' 8"  (1.727 m)   Wt 57.2 kg   SpO2 100%   BMI 19.16 kg/m  Physical Exam Vitals and nursing note reviewed.   56 year old male, resting comfortably and in no acute distress. Vital signs are significant for borderline elevated blood pressure. Oxygen saturation is 100%, which is normal. Head is normocephalic and atraumatic. PERRLA, EOMI. Oropharynx is clear. Neck is nontender and supple without adenopathy or JVD. Back is nontender and there is no CVA tenderness. Lungs are clear without rales, wheezes, or rhonchi. Chest is nontender.  Mediport is present on the right. Heart has regular rate and rhythm  without murmur. Abdomen is soft, flat, with moderate epigastric tenderness.  There is no rebound or guarding.  Peristalsis is hypoactive. Extremities have no cyanosis or edema, full range of motion is present. Skin is warm and dry without rash. Neurologic: Mental status is normal, cranial nerves are intact, moves all extremities equally.  ED Results / Procedures / Treatments   Labs (all labs ordered are listed, but only abnormal results are displayed) Labs Reviewed  BASIC METABOLIC PANEL - Abnormal; Notable for the following components:      Result Value   Glucose, Bld 118 (*)    BUN 22 (*)    Calcium 8.1 (*)    All other components within normal limits  HEPATIC FUNCTION PANEL - Abnormal; Notable for the following components:   Total Protein 5.0 (*)    Albumin 2.8 (*)    ALT  76 (*)    All other components within normal limits  C DIFFICILE QUICK SCREEN W PCR REFLEX    CBC  LIPASE, BLOOD  TROPONIN I (HIGH SENSITIVITY)  TROPONIN I (HIGH SENSITIVITY)    EKG EKG Interpretation  Date/Time:  Tuesday July 13 2022 23:01:11 EDT Ventricular Rate:  99 PR Interval:  147 QRS Duration: 92 QT Interval:  327 QTC Calculation: 420 R Axis:   -36 Text Interpretation: Sinus rhythm Biatrial enlargement Left axis deviation Probable anterolateral infarct, old Artifact in lead(s) I III aVL When compared with ECG of 02/09/2022, Premature ventricular complexes are no longer present Confirmed by Dione Booze ([id number]) on 07/14/2022 1:01:12 AM   EKG Interpretation  Date/Time:  Wednesday July 14 2022 02:56:49 EDT Ventricular Rate:  80 PR Interval:  150 QRS Duration: 84 QT Interval:  361 QTC Calculation: 417 R Axis:   0 Text Interpretation: Sinus rhythm Low voltage, precordial leads Abnormal R-wave progression, early transition When compared with ECG of 07/13/2022, No significant change was found Confirmed by Dione Booze ([id number]) on 07/14/2022 3:19:49 AM        Radiology CT ABDOMEN PELVIS WO  CONTRAST  Result Date: 07/14/2022 CLINICAL DATA:  Upper abdominal pain. EXAM: CT ABDOMEN AND PELVIS WITHOUT CONTRAST TECHNIQUE: Multidetector CT imaging of the abdomen and pelvis was performed following the standard protocol without IV contrast. RADIATION DOSE REDUCTION: This exam was performed according to the departmental dose-optimization program which includes automated exposure control, adjustment of the mA and/or kV according to patient size and/or use of iterative reconstruction technique. COMPARISON:  July 01, 2022 FINDINGS: Lower chest: Surgical sutures in linear scarring are seen within the posterior aspect of the left lung base. 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 Hepatobiliary: No focal liver abnormality is seen. Status post cholecystectomy. No biliary dilatation. Pancreas: Unremarkable. No pancreatic ductal dilatation or surrounding inflammatory changes. Spleen: Normal in size without focal abnormality. Adrenals/Urinary Tract: Adrenal glands are unremarkable. Kidneys are normal, without renal calculi, focal lesion, or hydronephrosis. The urinary bladder is poorly distended and is otherwise unremarkable. Stomach/Bowel: Stable postoperative changes are seen involving the esophagus, stomach and gastroesophageal junction. The appendix is surgically absent. No evidence of bowel wall thickening, distention, or inflammatory changes. Vascular/Lymphatic: Aortic atherosclerosis. No enlarged abdominal or pelvic lymph nodes. Reproductive: Prostate is unremarkable. Other: Small bilateral inguinal hernias are noted. No abdominopelvic ascites. Musculoskeletal: No acute or significant osseous findings. IMPRESSION: 1. Stable postoperative changes involving the esophagus, stomach and gastroesophageal junction. 2. Postoperative changes within the left lung base with adjacent eventration of the left hemidiaphragm. 3. Evidence of prior cholecystectomy. 4. Aortic atherosclerosis.  Aortic Atherosclerosis (ICD10-I70.0). Electronically Signed   By: Aram Candela M.D.   On: 07/14/2022 03:05   DG Chest 2 View  Result Date: 07/13/2022 CLINICAL DATA:  Chest pain.  Port-A-Cath in place. EXAM: CHEST - 2 VIEW COMPARISON:  CT chest dated February 09, 2022 FINDINGS: The heart is normal in size. Chronic elevation of the left hemidiaphragm with left basilar atelectasis and small hiatal hernia. Lungs are clear without evidence focal consolidation or pleural effusion. Right IJ access MediPort with distal tip in the SVC, unchanged. Surgical clips in the left upper quadrant. IMPRESSION: 1.  No acute cardiopulmonary process. 2. Chronic elevation of the left hemidiaphragm with left basilar atelectasis as well as postsurgical changes in the left upper quadrant. Small hiatal hernia. 3.  Right IJ access MediPort with distal tip in the SVC. Electronically Signed   By: Leona Carry  Ahmed D.O.   On: 07/13/2022 23:24    Procedures Procedures  Cardiac monitor shows normal sinus rhythm, per my interpretation.  Medications Ordered in ED Medications  ketorolac (TORADOL) 30 MG/ML injection 30 mg (has no administration in time range)  promethazine (PHENERGAN) 25 mg in sodium chloride 0.9 % 50 mL IVPB (has no administration in time range)    ED Course/ Medical Decision Making/ A&P                           Medical Decision Making Amount and/or Complexity of Data Reviewed Labs: ordered. Radiology: ordered.  Risk Prescription drug management.   Epigastric pain with vomiting and diarrhea.  This could be diarrhea related to amoxicillin-clavulanic acid versus C. difficile infection.  Abdominal pain differential includes, but is not limited to, peptic ulcer disease, GERD, pancreatitis, cholecystitis, diverticulitis.  Chest x-ray shows no evidence of pneumonia, unchanged from prior.  I have independently viewed the images, and agree with radiologist's interpretation.  I have reviewed and interpreted his  laboratory tests and my interpretation is normal CBC, mild elevation of BUN possibly consistent with dehydration, normal troponin.  I have ordered hepatic function panel and lipase.  I have ordered CT of abdomen and pelvis to evaluate for serious intra-abdominal pathology.  I have ordered stool sample for C. difficile testing.  I have ordered morphine for pain and promethazine for nausea.  I have reviewed and interpreted the repeat ECG, and my interpretation is no change from prior.  CT of abdomen shows no acute process.  I have independently viewed the images, and agree with radiologist interpretation.  I have reviewed and interpreted his laboratory tests and my interpretation is mild elevation of ALT which is not clinically significant, and hypoalbuminemia which is likely nutritional.  Unfortunately, he did not get relief with morphine and promethazine.  He was given additional morphine and chlorpromazine and still was not getting pain relief and still had considerable nausea.  He was given hydromorphone and still was not getting pain relief.  Although his work-up is benign, I am unable to control his pain and he will need to be admitted for pain control.  Case is discussed with Dr. Ronaldo Miyamoto of [facility], who agrees to admit the patient.  Final Clinical Impression(s) / ED Diagnoses Final diagnoses:  Intractable epigastric abdominal pain  Nausea and vomiting, unspecified vomiting type    Rx / DC Orders ED Discharge Orders     None         Dione Booze, MD 07/14/22 [phone]

## 2022-07-14 NOTE — H&P (Addendum)
History and Physical    Patient: Edwin Hall DOB: 03/11/1966 DOA: 07/13/2022 DOS: the patient was seen and examined on 07/14/2022 PCP: Leane Call, PA-C  Patient coming from: Home  Chief Complaint:  Chief Complaint  Patient presents with   N/V   Abdominal Pain   HPI: Edwin Hall is a 56 y.o. male with medical history significant of HLD, IBS. Presenting with abdominal pain, N/V. He reports over the last couple of days, he's had epigastric pain that has radiated into his chest. It more of a burning pain. He has had nausea and vomiting. He tried phenergan and APAP, but they have not been helpful. He also reports diarrhea. He states that he was recently on abx for PNA (augmentin and flagyl). He has not had any fevers, sick contacts, hematemesis, hematochezia. He's had poor PO intake, increased weakness and fatigue. When his symptoms did not improve last night, he decided to come to the ED for assistance.     Review of Systems: As mentioned in the history of present illness. All other systems reviewed and are negative. Past Medical History:  Diagnosis Date   Complication of anesthesia    nasuea and vomiting   Food allergy    Headache    Hiatal hernia    Hyperlipidemia    IBS (irritable bowel syndrome)    Iron deficiency anemia    Urticaria    Past Surgical History:  Procedure Laterality Date   ABDOMINAL SURGERY  12/2015   pylorus plasty   APPENDECTOMY  1984   BIOPSY  05/18/2020   Procedure: BIOPSY;  Surgeon: Kerin Salen, MD;  Location: [facility];  Service: Gastroenterology;;   CHOLECYSTECTOMY  2008   ESOPHAGOGASTRODUODENOSCOPY (EGD) WITH PROPOFOL N/A 05/18/2020   Procedure: ESOPHAGOGASTRODUODENOSCOPY (EGD) WITH PROPOFOL;  Surgeon: Kerin Salen, MD;  Location: [facility];  Service: Gastroenterology;  Laterality: N/A;   GASTROSTOMY TUBE PLACEMENT     HIATAL HERNIA REPAIR  07/2015   TALC PLEURODESIS     TONSILLECTOMY  1980   VENTRAL HERNIA REPAIR  2010    Social History:  reports that he has never smoked. He has never used smokeless tobacco. He reports that he does not drink alcohol and does not use drugs.  Allergies  Allergen Reactions   Contrast Media [Iodinated Contrast Media] Hives   Mushroom Extract Complex Anaphylaxis and Hives   Other Anaphylaxis    All seafood    Shellfish Allergy Anaphylaxis   Reglan [Metoclopramide] Hives    Severe itching all over   Iodine Hives   Tetracyclines & Related Itching   Zofran [Ondansetron Hcl] Itching and Rash    Family History  Problem Relation Age of Onset   Parkinson's disease Father    Heart disease Father    Angioedema Mother    Allergic rhinitis Neg Hx    Asthma Neg Hx    Eczema Neg Hx    Immunodeficiency Neg Hx    Urticaria Neg Hx     Prior to Admission medications   Medication Sig Start Date End Date Taking? Authorizing Provider  acetaminophen (TYLENOL) 500 MG tablet Take 1,000 mg by mouth every 6 (six) hours as needed for moderate pain.   Yes [provider]  aspirin 81 MG EC tablet Take 81 mg by mouth at bedtime. Swallow whole.   Yes [provider]  chlorpheniramine-HYDROcodone (TUSSIONEX) 10-8 MG/5ML Take 5 mLs by mouth every 12 (twelve) hours as needed for cough.   Yes [provider]  Cholecalciferol (VITAMIN D3) 10 MCG (400 UNIT) CAPS Take 400 Units by mouth daily.   Yes [provider]  Cyanocobalamin (VITAMIN B 12 PO) Take 1,000 mcg by mouth daily.   Yes [provider]  cyclobenzaprine (FLEXERIL) 10 MG tablet Take 10 mg by mouth 3 (three) times daily as needed for muscle spasms. 07/05/22  Yes [provider]  famotidine (PEPCID) 40 MG tablet Take 40 mg by mouth 2 (two) times daily. 06/21/22  Yes [provider]  Ferrous Sulfate (IRON) 325 (65 Fe) MG TABS Take 325 mg by mouth every other day.  05/12/20  Yes [provider]  ipratropium (ATROVENT) 0.06 % nasal spray USE TWO SPRAYS IN EACH NOSTRIL TWICE  DAILY AS DIRECTED. Patient taking differently: Place 1 spray into both nostrils daily as needed for rhinitis. 05/22/20  Yes Padgett, Pilar Grammes, MD  LINZESS 290 MCG CAPS capsule Take 290 mcg by mouth daily as needed (abdominal cramping).  07/03/20  Yes [provider]  Magnesium 250 MG TABS Take 250 mg by mouth daily.   Yes [provider]  metoCLOPramide (REGLAN) 10 MG tablet Take 10 mg by mouth 2 (two) times daily before a meal. 06/30/22  Yes [provider]  montelukast (SINGULAIR) 10 MG tablet Take 10 mg by mouth daily as needed (allergies). 12/11/21  Yes [provider]  Multiple Vitamin (MULTIVITAMIN WITH MINERALS) TABS tablet Take 1 tablet by mouth daily.   Yes [provider]  pantoprazole (PROTONIX) 40 MG tablet Take 40 mg by mouth 2 (two) times daily.   Yes [provider]  PROAIR DIGIHALER 108 (90 Base) MCG/ACT AEPB Take 2 puffs by mouth every 4 (four) hours as needed (sob/wheezing). 09/22/21  Yes [provider]  Probiotic Product (PROBIOTIC ADVANCED) CAPS Take 1 capsule by mouth 2 (two) times daily.   Yes [provider]  promethazine (PHENERGAN) 25 MG tablet Take 25 mg by mouth every 6 (six) hours as needed for nausea or vomiting.   Yes [provider]  rosuvastatin (CRESTOR) 20 MG tablet Take 20 mg by mouth at bedtime.   Yes [provider]  sucralfate (CARAFATE) 1 g tablet Take 1 tablet (1 g total) by mouth 4 (four) times daily -  with meals and at bedtime. 10/17/20  Yes Tilden Fossa, MD  traZODone (DESYREL) 50 MG tablet Take 50 mg by mouth at bedtime.   Yes [provider]  Zinc 50 MG TABS Take 50 mg by mouth daily.    Yes [provider]  amoxicillin-clavulanate (AUGMENTIN) 875-125 MG tablet Take 1 tablet by mouth 2 (two) times daily. Finished 07-13-22 07/03/22   [provider]  azelastine (ASTELIN) 0.1 % nasal spray 2 sprays per nostril twice a day for control of  nasal drainage. Patient not taking: Reported on 05/02/2020 11/27/19 05/02/20  Marcelyn Bruins, MD    Physical Exam: Vitals:   07/14/22 0645 07/14/22 0715 07/14/22 0730 07/14/22 0752  BP: (!) 109/93 114/85 119/88 (!) 135/92  Pulse: 79 78 89 86  Resp: 13 10 17  (!) 26  Temp:      TempSrc:      SpO2: 97% 96% 93% 98%  Weight:      Height:       General: 56 y.o. male resting in bed in NAD Eyes: PERRL, normal sclera ENMT: Nares patent w/o discharge, orophaynx clear, dentition normal, ears w/o discharge/lesions/ulcers Neck: Supple, trachea midline Cardiovascular: RRR, +S1, S2, no g/r, 1/6 systolic murmur, equal pulses  throughout Respiratory: CTABL, no w/r/r, normal WOB GI: BS+, ND, mild TTP epigastric, no masses noted, no organomegaly noted MSK: No e/c/c Neuro: A&O x 3, no focal deficits Psyc: Appropriate interaction but flat affect, calm/cooperative   Data Reviewed:  Lab Results  Component Value Date   NA 137 07/14/2022   K 3.9 07/14/2022   CO2 25 07/14/2022   GLUCOSE 118 (H) 07/14/2022   BUN 22 (H) 07/14/2022   CREATININE 0.86 07/14/2022   CALCIUM 8.1 (L) 07/14/2022   GFRNONAA >60 07/14/2022   Lab Results  Component Value Date   WBC 9.0 07/14/2022   HGB 14.1 07/14/2022   HCT 41.4 07/14/2022   MCV 93.7 07/14/2022   PLT 191 07/14/2022   CXR:  1.  No acute cardiopulmonary process. 2. Chronic elevation of the left hemidiaphragm with left basilar atelectasis as well as postsurgical changes in the left upper quadrant. Small hiatal hernia.  3.  Right IJ access MediPort with distal tip in the SVC.  CT ab/pelvis w/o 1. Stable postoperative changes involving the esophagus, stomach and gastroesophageal junction. 2. Postoperative changes within the left lung base with adjacent eventration of the left hemidiaphragm. 3. Evidence of prior cholecystectomy. 4. Aortic atherosclerosis.  Assessment and Plan: Abdominal pain N/V     - admit to inpt, meg-surg     - he  had an esophagectomy back in January w/ an EGD follow up about a month ago in [place]. He reports that the EGD went well. Get records from Dr. Charm Barges.     - for now, anti-emetics, clear liquids, fluids     - given protonix in ED, continue as 40mg  BID     - GI consulted (Dr. )  Diarrhea     - c/o watery diarrhea over the last 5 days     - no white count, no fever, and CT w/o bowel thickening or inflammatory changes     - c diff has been ordered; will await that     - ok for right now to continue the PPI; but should c diff turn up positive, will hold PPI  HLD     - continue home regimen  Severe protein calorie malnutrition     - dietitian consult  Advance Care Planning:   Code Status: FULL  Consults: GI (Dr. Loreta Ave)  Family Communication: None at bedside  Severity of Illness: The appropriate patient status for this patient is INPATIENT. Inpatient status is judged to be reasonable and necessary in order to provide the required intensity of service to ensure the patient's safety. The patient's presenting symptoms, physical exam findings, and initial radiographic and laboratory data in the context of their chronic comorbidities is felt to place them at high risk for further clinical deterioration. Furthermore, it is not anticipated that the patient will be medically stable for discharge from the hospital within 2 midnights of admission.   * I certify that at the point of admission it is my clinical judgment that the patient will require inpatient hospital care spanning beyond 2 midnights from the point of admission due to high intensity of service, high risk for further deterioration and high frequency of surveillance required.*  Time spent in coordination of this H&P: 57  minutes   Author: Loreta Ave, DO 07/14/2022 8:39 AM  For on call review [email or web address].

## 2022-07-15 DIAGNOSIS — E440 Moderate protein-calorie malnutrition: Secondary | ICD-10-CM | POA: Insufficient documentation

## 2022-07-15 DIAGNOSIS — R1013 Epigastric pain: Secondary | ICD-10-CM | POA: Diagnosis not present

## 2022-07-15 LAB — URINALYSIS, ROUTINE W REFLEX MICROSCOPIC
Bilirubin Urine: NEGATIVE
Glucose, UA: NEGATIVE mg/dL
Hgb urine dipstick: NEGATIVE
Ketones, ur: NEGATIVE mg/dL
Leukocytes,Ua: NEGATIVE
Nitrite: NEGATIVE
Protein, ur: NEGATIVE mg/dL
Specific Gravity, Urine: 1.004 — ABNORMAL LOW (ref 1.005–1.030)
pH: 7 (ref 5.0–8.0)

## 2022-07-15 LAB — CBC
HCT: 40.6 % (ref 39.0–52.0)
Hemoglobin: 13.7 g/dL (ref 13.0–17.0)
MCH: 32.2 pg (ref 26.0–34.0)
MCHC: 33.7 g/dL (ref 30.0–36.0)
MCV: 95.3 fL (ref 80.0–100.0)
Platelets: 173 10*3/uL (ref 150–400)
RBC: 4.26 MIL/uL (ref 4.22–5.81)
RDW: 13.3 % (ref 11.5–15.5)
WBC: 6.7 10*3/uL (ref 4.0–10.5)
nRBC: 0 % (ref 0.0–0.2)

## 2022-07-15 LAB — COMPREHENSIVE METABOLIC PANEL
ALT: 187 U/L — ABNORMAL HIGH (ref 0–44)
AST: 77 U/L — ABNORMAL HIGH (ref 15–41)
Albumin: 3.2 g/dL — ABNORMAL LOW (ref 3.5–5.0)
Alkaline Phosphatase: 82 U/L (ref 38–126)
Anion gap: 7 (ref 5–15)
BUN: 10 mg/dL (ref 6–20)
CO2: 27 mmol/L (ref 22–32)
Calcium: 8.4 mg/dL — ABNORMAL LOW (ref 8.9–10.3)
Chloride: 102 mmol/L (ref 98–111)
Creatinine, Ser: 0.66 mg/dL (ref 0.61–1.24)
GFR, Estimated: >60 mL/min (ref >60)
Glucose, Bld: 103 mg/dL — ABNORMAL HIGH (ref 70–99)
Potassium: 4 mmol/L (ref 3.5–5.1)
Sodium: 136 mmol/L (ref 135–145)
Total Bilirubin: 1 mg/dL (ref 0.3–1.2)
Total Protein: 5.5 g/dL — ABNORMAL LOW (ref 6.5–8.1)

## 2022-07-15 LAB — HIV ANTIBODY (ROUTINE TESTING W REFLEX): HIV Screen 4th Generation wRfx: NONREACTIVE

## 2022-07-15 MED ORDER — HYDROMORPHONE HCL 2 MG PO TABS
2.0000 mg | ORAL_TABLET | ORAL | Status: DC | PRN
  Administered 2022-07-15 – 2022-07-16 (×7): 2 mg via ORAL
  Filled 2022-07-15 (×8): qty 1

## 2022-07-15 MED ORDER — SODIUM CHLORIDE 0.9% FLUSH: 10.0000 mL | INTRAVENOUS | Status: DC | PRN

## 2022-07-15 MED ORDER — BOOST / RESOURCE BREEZE PO LIQD CUSTOM: 1.0000 | Freq: Three times a day (TID) | ORAL | Status: DC

## 2022-07-15 MED ORDER — ENSURE ENLIVE PO LIQD: 237.0000 mL | ORAL | Status: DC

## 2022-07-15 MED ORDER — BANATROL TF EN LIQD
60.0000 mL | Freq: Two times a day (BID) | ENTERAL | Status: DC
  Administered 2022-07-15 – 2022-07-16 (×3): 60 mL via ORAL
  Filled 2022-07-15 (×4): qty 60

## 2022-07-15 NOTE — Progress Notes (Signed)
  Transition of Care [facility]) Screening Note   Patient Details  Name: SERAPIO EDELSON Date of Birth: 27-Nov-1965   Transition of Care (TOC) CM/SW Contact:    Amada Jupiter, LCSW Phone Number: 07/15/2022, 11:00 AM    Transition of Care Department [facility]) has reviewed patient and no TOC needs have been identified at this time. We will continue to monitor patient advancement through interdisciplinary progression rounds. If new patient transition needs arise, please place a TOC consult.

## 2022-07-15 NOTE — Progress Notes (Signed)
PROGRESS NOTE  Edwin Hall CBS:[id number] DOB: 1966/04/04 DOA: 07/13/2022 PCP: Leane Call, PA-C   LOS: 1 day   Brief Narrative / Interim history: Edwin Hall is a 56 y.o. male with medical history significant of HLD, IBS. Presenting with abdominal pain, N/V. He reports over the last couple of days, he's had epigastric pain that has radiated into his chest. It more of a burning pain. He has had nausea and vomiting. He tried phenergan and APAP, but they have not been helpful. He also reports diarrhea. He states that he was recently on abx for PNA (augmentin and flagyl).   Pertinent data: CT abdomen/pelvis 8/30-stable postoperative changes of the esophagus, stomach, and GE junction, postoperative changes in the left lung base with adjacent eventration of the left hemidiaphragm Chest x-ray 8/29-no acute cardiopulmonary process  Subjective / 24h Interval events: Continues to complain of severe left-sided abdominal pain from the LLQ to LUQ, pain worsened every time he eats something  Assesement and Plan: Principal Problem:   Abdominal pain Active Problems:   Nausea & vomiting   Protein-calorie malnutrition, severe (HCC)   Dyslipidemia   Diarrhea  Principal problem Abdominal pain, nausea and vomiting-patient gets his care at [facility], had an esophagectomy back in January for Barrett's esophagus, and postop he has been dealing with significant pain as well as recurrent pneumonia.  His CT scan on admission without acute findings.  His primary gastroenterologist just did an endoscopy few weeks ago without acute findings.  GI consulted here, nothing further to add and recommending him following up with his primary surgeon at [facility], he has an appointment next week -This was discussed with the patient, no clear cause for his pain but best for him is to follow-up with his primary [facility] MD.  Work on pain control today, add oral Dilaudid, advance to full liquids and continue  supportive care.  Continues to require IV Dilaudid, attempt to wean off  Active problems Watery diarrhea-concern for C. difficile given recent antibiotics.  C. difficile is pending  Dysuria-new onset this morning, send a urinalysis  Hyperlipidemia-continue statin   Scheduled Meds:  aspirin EC  81 mg Oral QHS   Chlorhexidine Gluconate Cloth  6 each Topical Q0600   cholecalciferol  400 Units Oral Daily   cyanocobalamin  1,000 mcg Oral Daily   enoxaparin (LOVENOX) injection  40 mg Subcutaneous Q24H   magnesium oxide  400 mg Oral Daily   multivitamin with minerals  1 tablet Oral Daily   pantoprazole (PROTONIX) IV  40 mg Intravenous Q12H   rosuvastatin  20 mg Oral QHS   sucralfate  1 g Oral TID WC & HS   traZODone  50 mg Oral QHS   Continuous Infusions:  lactated ringers 100 mL/hr at 07/14/22 1109   promethazine (PHENERGAN) injection (IM or IVPB) 25 mg (07/15/22 0132)   PRN Meds:.acetaminophen **OR** acetaminophen, albuterol, cyclobenzaprine, HYDROmorphone (DILAUDID) injection, HYDROmorphone, montelukast, prochlorperazine, promethazine (PHENERGAN) injection (IM or IVPB), sodium chloride flush  Diet Orders (From admission, onward)     Start     Ordered   07/14/22 1306  Diet clear liquid Room service appropriate? Yes; Fluid consistency: Thin  Diet effective now       Question Answer Comment  Room service appropriate? Yes   Fluid consistency: Thin      07/14/22 1305            DVT prophylaxis: enoxaparin (LOVENOX) injection 40 mg Start: 07/14/22 2200   Lab  Results  Component Value Date   PLT 173 07/15/2022      Code Status: Full Code  Family Communication: No family at bedside  Status is: Inpatient  Remains inpatient appropriate because: Remains in pain, adjusting pain medications  Level of care: Med-Surg  Consultants:  GI  Objective: Vitals:   07/14/22 2156 07/15/22 0133 07/15/22 0613 07/15/22 0933  BP: (!) 125/93 (!) 130/98 (!) 133/92 (!) 145/99  Pulse:  72 76 68 85  Resp: 18 18 18 17   Temp: 98 F (36.7 C) 98.1 F (36.7 C) 97.8 F (36.6 C) 98.2 F (36.8 C)  TempSrc: Oral Oral Oral Oral  SpO2: 97% 100% 98% 100%  Weight:      Height:        Intake/Output Summary (Last 24 hours) at 07/15/2022 1030 Last data filed at 07/15/2022 1000 Gross per 24 hour  Intake 2031.55 ml  Output 3300 ml  Net -1268.45 ml   Wt Readings from Last 3 Encounters:  07/13/22 57.2 kg  02/09/22 58.1 kg  07/03/21 61.7 kg    Examination:  Constitutional: NAD Eyes: no scleral icterus ENMT: Mucous membranes are moist.  Neck: normal, supple Respiratory: clear to auscultation bilaterally, no wheezing, no crackles. Normal respiratory effort. No accessory muscle use.  Cardiovascular: Regular rate and rhythm, no murmurs / rubs / gallops. No LE edema.  Abdomen: Mild tenderness to palpation in the left lower quadrant, no distention, bowel sounds positive Musculoskeletal: no clubbing / cyanosis.  Skin: no rashes Neurologic: non focal   Data Reviewed: I have independently reviewed following labs and imaging studies   CBC Recent Labs  Lab 07/14/22 0010 07/15/22 0838  WBC 9.0 6.7  HGB 14.1 13.7  HCT 41.4 40.6  PLT 191 173  MCV 93.7 95.3  MCH 31.9 32.2  MCHC 34.1 33.7  RDW 13.2 13.3    Recent Labs  Lab 07/14/22 0010 07/14/22 0238 07/15/22 0838  NA 137  --  136  K 3.9  --  4.0  CL 107  --  102  CO2 25  --  27  GLUCOSE 118*  --  103*  BUN 22*  --  10  CREATININE 0.86  --  0.66  CALCIUM 8.1*  --  8.4*  AST  --  35 77*  ALT  --  68* 187*  ALKPHOS  --  38 82  BILITOT  --  0.6 1.0  ALBUMIN  --  2.8* 3.2*    ------------------------------------------------------------------------------------------------------------------ No results for input(s): "CHOL", "HDL", "LDLCALC", "TRIG", "CHOLHDL", "LDLDIRECT" in the last 72 hours.  No results found for:  "HGBA1C" ------------------------------------------------------------------------------------------------------------------ No results for input(s): "TSH", "T4TOTAL", "T3FREE", "THYROIDAB" in the last 72 hours.  Invalid input(s): "FREET3"  Cardiac Enzymes No results for input(s): "CKMB", "TROPONINI", "MYOGLOBIN" in the last 168 hours.  Invalid input(s): "CK" ------------------------------------------------------------------------------------------------------------------ No results found for: "BNP"  CBG: No results for input(s): "GLUCAP" in the last 168 hours.  No results found for this or any previous visit (from the past 240 hour(s)).   Radiology Studies: No results found.   07/17/22, MD, PhD [facility]  Between 7 am - 7 pm I am available, please contact me via Amion (for emergencies) or Securechat (non urgent messages)  Between 7 pm - 7 am I am not available, please contact night coverage MD/APP via Amion

## 2022-07-15 NOTE — Progress Notes (Signed)
Initial Nutrition Assessment  DOCUMENTATION CODES:   Non-severe (moderate) malnutrition in context of chronic illness  INTERVENTION:  Boost Breeze po TID, each supplement provides 250 kcal and 9 grams of protein Ensure Enlive po once daily, each supplement provides 350 kcal and 20 grams of protein. Magic cup TID with meals, each supplement provides 290 kcal and 9 grams of protein MVI with minerals daily Banatrol BID, each supplement provides 45kcal, 5g soluble fiber and 2g protein per serving.  NUTRITION DIAGNOSIS:   Moderate Malnutrition related to chronic illness (Barrett's esophagus s/p esophagectomy) as evidenced by mild fat depletion, moderate fat depletion, severe muscle depletion, moderate muscle depletion.  GOAL:   Patient will meet greater than or equal to 90% of their needs  MONITOR:   PO intake, Supplement acceptance, Diet advancement, Labs, Weight trends, I & O's  REASON FOR ASSESSMENT:   Consult Assessment of nutrition requirement/status  ASSESSMENT:   Pt admitted with nausea, vomiting, and abdominal pain. PMH significant for HLD, IBS.   Food allergies: mushroom extract, shellfish  Per MD, no clear etiology of his current abdominal pain. Recommend follow up with primary [facility] MD.   Spoke with patient at bedside. He states that since his esophagectomy in January at [facility] he has had ongoing nausea and vomiting. He throws up about 2 times daily. He has been able to tolerate soft foods and liquids better than solids. He recalls eating mashed potatoes with gravy and eggs with cheese. He has also been drinking Boost Breeze at home. He does not enjoy Ensure or Boost as they have a medicinal taste but is agreeable to trying to mix once with Magic cup or ice cream.   At time of visit, pt stated that his diet had just been upgraded to a full liquid diet. He is happy about this and is ready for a regular diet so that he can order a variety of other food he feels he can tolerate.  Ordered an additional bowl of potato soup for patient per his request.   Pt states he has lost about 30 lbs since January. He endorses difficulty gaining weight has he has been eating less than he was and has been throwing up.   Reviewed weight. Within the last year, it appears he has had a 7.2% weight loss which is not significant for time frame.   Medications: Vitamin D3, Vitamin B12, mag-ox, MVI, protonix, carafate  Labs: AST 77, ALT 187  NUTRITION - FOCUSED PHYSICAL EXAM:  Flowsheet Row Most Recent Value  Orbital Region Mild depletion  Upper Arm Region Moderate depletion  Thoracic and Lumbar Region Mild depletion  Buccal Region Moderate depletion  Temple Region Moderate depletion  Clavicle Bone Region Moderate depletion  Clavicle and Acromion Bone Region Mild depletion  Scapular Bone Region Moderate depletion  Dorsal Hand Mild depletion  Patellar Region Severe depletion  Anterior Thigh Region Severe depletion  Posterior Calf Region Moderate depletion  Edema (RD Assessment) None  Hair Reviewed  Eyes Reviewed  Mouth Reviewed  Skin Reviewed  Nails Reviewed       Diet Order:   Diet Order             Diet full liquid Room service appropriate? Yes; Fluid consistency: Thin  Diet effective now                   EDUCATION NEEDS:   Education needs have been addressed  Skin:  Skin Assessment: Reviewed RN Assessment  Last BM:  PTA  Height:   Ht Readings from Last 1 Encounters:  07/13/22 5\' 8"  (1.727 m)    Weight:   Wt Readings from Last 1 Encounters:  07/13/22 57.2 kg   BMI:  Body mass index is 19.16 kg/m.  Estimated Nutritional Needs:   Kcal:  1700-1900  Protein:  85-100g  Fluid:  >/=1.7L  07/15/22, RDN, LDN Clinical Nutrition

## 2022-07-16 DIAGNOSIS — R1013 Epigastric pain: Secondary | ICD-10-CM | POA: Diagnosis not present

## 2022-07-16 LAB — CBC
HCT: 38.3 % — ABNORMAL LOW (ref 39.0–52.0)
Hemoglobin: 12.9 g/dL — ABNORMAL LOW (ref 13.0–17.0)
MCH: 32.6 pg (ref 26.0–34.0)
MCHC: 33.7 g/dL (ref 30.0–36.0)
MCV: 96.7 fL (ref 80.0–100.0)
Platelets: 154 10*3/uL (ref 150–400)
RBC: 3.96 MIL/uL — ABNORMAL LOW (ref 4.22–5.81)
RDW: 13.5 % (ref 11.5–15.5)
WBC: 7.3 10*3/uL (ref 4.0–10.5)
nRBC: 0 % (ref 0.0–0.2)

## 2022-07-16 LAB — COMPREHENSIVE METABOLIC PANEL
ALT: 127 U/L — ABNORMAL HIGH (ref 0–44)
AST: 33 U/L (ref 15–41)
Albumin: 2.8 g/dL — ABNORMAL LOW (ref 3.5–5.0)
Alkaline Phosphatase: 63 U/L (ref 38–126)
Anion gap: 7 (ref 5–15)
BUN: 9 mg/dL (ref 6–20)
CO2: 27 mmol/L (ref 22–32)
Calcium: 8.5 mg/dL — ABNORMAL LOW (ref 8.9–10.3)
Chloride: 103 mmol/L (ref 98–111)
Creatinine, Ser: 0.63 mg/dL (ref 0.61–1.24)
GFR, Estimated: >60 mL/min (ref >60)
Glucose, Bld: 118 mg/dL — ABNORMAL HIGH (ref 70–99)
Potassium: 4.4 mmol/L (ref 3.5–5.1)
Sodium: 137 mmol/L (ref 135–145)
Total Bilirubin: 0.8 mg/dL (ref 0.3–1.2)
Total Protein: 5 g/dL — ABNORMAL LOW (ref 6.5–8.1)

## 2022-07-16 LAB — MAGNESIUM: Magnesium: 1.9 mg/dL (ref 1.7–2.4)

## 2022-07-16 MED ORDER — HYDROMORPHONE HCL 2 MG PO TABS: 2.0000 mg | ORAL_TABLET | Freq: Four times a day (QID) | ORAL | 0 refills | Status: DC | PRN

## 2022-07-16 MED ORDER — HEPARIN SOD (PORK) LOCK FLUSH 100 UNIT/ML IV SOLN
500.0000 [IU] | INTRAVENOUS | Status: AC | PRN
  Administered 2022-07-16: 500 [IU]
  Filled 2022-07-16: qty 5

## 2022-07-16 MED ORDER — PANTOPRAZOLE SODIUM 40 MG PO TBEC
40.0000 mg | DELAYED_RELEASE_TABLET | Freq: Two times a day (BID) | ORAL | Status: DC
  Administered 2022-07-16: 40 mg via ORAL
  Filled 2022-07-16: qty 1

## 2022-07-16 NOTE — Discharge Summary (Signed)
Physician Discharge Summary  Edwin Hall XBJ:[id number] DOB: 05/26/1966 DOA: 07/13/2022  PCP: Leane Call, PA-C  Admit date: 07/13/2022 Discharge date: 07/16/2022  Admitted From: home Disposition:  home  Recommendations for Outpatient Follow-up:  Follow up with PCP in 1-2 weeks Follow up with [facility] surgery next week as scheduled  Home Health: none Equipment/Devices: none  Discharge Condition: stable CODE STATUS: Full code  HPI: Per admitting MD, Edwin Hall is a 56 y.o. male with medical history significant of HLD, IBS. Presenting with abdominal pain, N/V. He reports over the last couple of days, he's had epigastric pain that has radiated into his chest. It more of a burning pain. He has had nausea and vomiting. He tried phenergan and APAP, but they have not been helpful. He also reports diarrhea. He states that he was recently on abx for PNA (augmentin and flagyl). He has not had any fevers, sick contacts, hematemesis, hematochezia. He's had poor PO intake, increased weakness and fatigue. When his symptoms did not improve last night, he decided to come to the ED for assistance.     Hospital Course / Discharge diagnoses: Principal Problem:   Abdominal pain Active Problems:   Nausea & vomiting   Protein-calorie malnutrition, severe (HCC)   Dyslipidemia   Diarrhea   Malnutrition of moderate degree   Principal problem Abdominal pain, nausea and vomiting-patient gets his care at [facility], had an esophagectomy back in January for Barrett's esophagus, and postop he has been dealing with significant pain as well as recurrent pneumonia.  His CT scan on admission without acute findings.  His primary gastroenterologist just did an endoscopy few weeks ago without acute findings.  GI consulted here, nothing further to add and recommending him following up with his primary surgeon at [facility], he has an appointment next week. This was extensively discussed with the  patient, no clear cause for his pain but best for him is to follow-up with his primary [facility] MD. His pain is a little bit better with oral Dilaudid, he was advised to eat small frequent meals, and follow-up all the recommendations that were given to him from his surgeon.   Active problems Watery diarrhea-concern for C. difficile given recent antibiotics.  His diarrhea resolved and thus a C. difficile could not be sent. This was likely a side effect from his recent antibiotics.  He is afebrile and WBC is normal.  Hyperlipidemia-continue statin  Sepsis ruled out   Discharge Instructions   Allergies as of 07/16/2022       Reactions   Contrast Media [iodinated Contrast Media] Hives   Mushroom Extract Complex Anaphylaxis, Hives   Other Anaphylaxis   All seafood    Shellfish Allergy Anaphylaxis   Reglan [metoclopramide] Hives   Severe itching all over   Iodine Hives   Tetracyclines & Related Itching   Zofran [ondansetron Hcl] Itching, Rash        Medication List     STOP taking these medications    amoxicillin-clavulanate 875-125 MG tablet Commonly known as: AUGMENTIN       TAKE these medications    acetaminophen 500 MG tablet Commonly known as: TYLENOL Take 1,000 mg by mouth every 6 (six) hours as needed for moderate pain.   aspirin EC 81 MG tablet Take 81 mg by mouth at bedtime. Swallow whole.   chlorpheniramine-HYDROcodone 10-8 MG/5ML Commonly known as: TUSSIONEX Take 5 mLs by mouth every 12 (twelve) hours as needed for cough.  cyclobenzaprine 10 MG tablet Commonly known as: FLEXERIL Take 10 mg by mouth 3 (three) times daily as needed for muscle spasms.   famotidine 40 MG tablet Commonly known as: PEPCID Take 40 mg by mouth 2 (two) times daily.   HYDROmorphone 2 MG tablet Commonly known as: DILAUDID Take 1 tablet (2 mg total) by mouth every 6 (six) hours as needed for severe pain.   ipratropium 0.06 % nasal spray Commonly known as: ATROVENT USE TWO SPRAYS  IN EACH NOSTRIL TWICE DAILY AS DIRECTED. What changed: See the new instructions.   Iron 325 (65 Fe) MG Tabs Take 325 mg by mouth every other day.   Linzess 290 MCG Caps capsule Generic drug: linaclotide Take 290 mcg by mouth daily as needed (abdominal cramping).   Magnesium 250 MG Tabs Take 250 mg by mouth daily.   metoCLOPramide 10 MG tablet Commonly known as: REGLAN Take 10 mg by mouth 2 (two) times daily before a meal.   montelukast 10 MG tablet Commonly known as: SINGULAIR Take 10 mg by mouth daily as needed (allergies).   multivitamin with minerals Tabs tablet Take 1 tablet by mouth daily.   pantoprazole 40 MG tablet Commonly known as: PROTONIX Take 40 mg by mouth 2 (two) times daily.   ProAir Digihaler 108 (90 Base) MCG/ACT Aepb Generic drug: Albuterol Sulfate (sensor) Take 2 puffs by mouth every 4 (four) hours as needed (sob/wheezing).   Probiotic Advanced Caps Take 1 capsule by mouth 2 (two) times daily.   promethazine 25 MG tablet Commonly known as: PHENERGAN Take 25 mg by mouth every 6 (six) hours as needed for nausea or vomiting.   rosuvastatin 20 MG tablet Commonly known as: CRESTOR Take 20 mg by mouth at bedtime.   sucralfate 1 g tablet Commonly known as: CARAFATE Take 1 tablet (1 g total) by mouth 4 (four) times daily -  with meals and at bedtime.   traZODone 50 MG tablet Commonly known as: DESYREL Take 50 mg by mouth at bedtime.   VITAMIN B 12 PO Take 1,000 mcg by mouth daily.   Vitamin D3 10 MCG (400 UNIT) Caps Take 400 Units by mouth daily.   Zinc 50 MG Tabs Take 50 mg by mouth daily.        Follow-up Information     Nodal, Joline Salt, PA-C Follow up in 1 week(s).   Specialty: Physician Assistant Contact information: [address] [place] [facility] [place] [address] [phone]         Haithcock, Bethann Humble, MD Follow up.   Specialty: Cardiothoracic Surgery Why: as scheduled in 5 days Contact information: [id number] [place] [address] [phone]                 Consultations: GI  Procedures/Studies:  CT ABDOMEN PELVIS WO CONTRAST  Result Date: 07/14/2022 CLINICAL DATA:  Upper abdominal pain. EXAM: CT ABDOMEN AND PELVIS WITHOUT CONTRAST TECHNIQUE: Multidetector CT imaging of the abdomen and pelvis was performed following the standard protocol without IV contrast. RADIATION DOSE REDUCTION: This exam was performed according to the departmental dose-optimization program which includes automated exposure control, adjustment of the mA and/or kV according to patient size and/or use of iterative reconstruction technique. COMPARISON:  July 01, 2022 FINDINGS: Lower chest: Surgical sutures in linear scarring are seen within the posterior aspect of the left lung base. 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 Hepatobiliary: No focal liver abnormality is seen. Status post  cholecystectomy. No biliary dilatation. Pancreas: Unremarkable. No pancreatic ductal dilatation or surrounding inflammatory changes. Spleen: Normal in size without focal abnormality. Adrenals/Urinary Tract: Adrenal glands are unremarkable. Kidneys are normal, without renal calculi, focal lesion, or hydronephrosis. The urinary bladder is poorly distended and is otherwise unremarkable. Stomach/Bowel: Stable postoperative changes are seen involving the esophagus, stomach and gastroesophageal junction. The appendix is surgically absent. No evidence of bowel wall thickening, distention, or inflammatory changes. Vascular/Lymphatic: Aortic atherosclerosis. No enlarged abdominal or pelvic lymph nodes. Reproductive: Prostate is unremarkable. Other: Small bilateral inguinal hernias are noted. No abdominopelvic ascites. Musculoskeletal: No acute or significant osseous findings. IMPRESSION: 1. Stable postoperative changes involving the esophagus, stomach and gastroesophageal junction. 2.  Postoperative changes within the left lung base with adjacent eventration of the left hemidiaphragm. 3. Evidence of prior cholecystectomy. 4. Aortic atherosclerosis. Aortic Atherosclerosis (ICD10-I70.0). Electronically Signed   By: Aram Candela M.D.   On: 07/14/2022 03:05   DG Chest 2 View  Result Date: 07/13/2022 CLINICAL DATA:  Chest pain.  Port-A-Cath in place. EXAM: CHEST - 2 VIEW COMPARISON:  CT chest dated February 09, 2022 FINDINGS: The heart is normal in size. Chronic elevation of the left hemidiaphragm with left basilar atelectasis and small hiatal hernia. Lungs are clear without evidence focal consolidation or pleural effusion. Right IJ access MediPort with distal tip in the SVC, unchanged. Surgical clips in the left upper quadrant. IMPRESSION: 1.  No acute cardiopulmonary process. 2. Chronic elevation of the left hemidiaphragm with left basilar atelectasis as well as postsurgical changes in the left upper quadrant. Small hiatal hernia. 3.  Right IJ access MediPort with distal tip in the SVC. Electronically Signed   By: Larose Hires D.O.   On: 07/13/2022 23:24     Subjective: - no chest pain, shortness of breath, no abdominal pain, nausea or vomiting.   Discharge Exam: BP (!) 154/99 (BP Location: Left Arm)   Pulse 84   Temp 98.1 F (36.7 C) (Oral)   Resp 16   Ht 5\' 8"  (1.727 m)   Wt 57.2 kg   SpO2 100%   BMI 19.16 kg/m   General: Pt is alert, awake, not in acute distress Cardiovascular: RRR, S1/S2 +, no rubs, no gallops Respiratory: CTA bilaterally, no wheezing, no rhonchi Abdominal: Soft, NT, ND, bowel sounds + Extremities: no edema, no cyanosis    The results of significant diagnostics from this hospitalization (including imaging, microbiology, ancillary and laboratory) are listed below for reference.     Microbiology: No results found for this or any previous visit (from the past 240 hour(s)).   Labs: Basic Metabolic Panel: Recent Labs  Lab 07/14/22 0010  07/15/22 0838 07/16/22 0314  NA 137 136 137  K 3.9 4.0 4.4  CL 107 102 103  CO2 25 27 27   GLUCOSE 118* 103* 118*  BUN 22* 10 9  CREATININE 0.86 0.66 0.63  CALCIUM 8.1* 8.4* 8.5*  MG  --   --  1.9   Liver Function Tests: Recent Labs  Lab 07/14/22 0238 07/15/22 0838 07/16/22 0314  AST 35 77* 33  ALT 68* 187* 127*  ALKPHOS 38 82 63  BILITOT 0.6 1.0 0.8  PROT 5.0* 5.5* 5.0*  ALBUMIN 2.8* 3.2* 2.8*   CBC: Recent Labs  Lab 07/14/22 0010 07/15/22 0838 07/16/22 0314  WBC 9.0 6.7 7.3  HGB 14.1 13.7 12.9*  HCT 41.4 40.6 38.3*  MCV 93.7 95.3 96.7  PLT 191 173 154   CBG: No results for input(s): "GLUCAP" in  the last 168 hours. Hgb A1c No results for input(s): "HGBA1C" in the last 72 hours. Lipid Profile No results for input(s): "CHOL", "HDL", "LDLCALC", "TRIG", "CHOLHDL", "LDLDIRECT" in the last 72 hours. Thyroid function studies No results for input(s): "TSH", "T4TOTAL", "T3FREE", "THYROIDAB" in the last 72 hours.  Invalid input(s): "FREET3" Urinalysis    Component Value Date/Time   COLORURINE STRAW (A) 07/15/2022 1149   APPEARANCEUR CLEAR 07/15/2022 1149   LABSPEC 1.004 (L) 07/15/2022 1149   PHURINE 7.0 07/15/2022 1149   GLUCOSEU NEGATIVE 07/15/2022 1149   HGBUR NEGATIVE 07/15/2022 1149   BILIRUBINUR NEGATIVE 07/15/2022 1149   KETONESUR NEGATIVE 07/15/2022 1149   PROTEINUR NEGATIVE 07/15/2022 1149   NITRITE NEGATIVE 07/15/2022 1149   LEUKOCYTESUR NEGATIVE 07/15/2022 1149    FURTHER DISCHARGE INSTRUCTIONS:   Get Medicines reviewed and adjusted: Please take all your medications with you for your next visit with your Primary MD   Laboratory/radiological data: Please request your Primary MD to go over all hospital tests and procedure/radiological results at the follow up, please ask your Primary MD to get all Hospital records sent to his/her office.   In some cases, they will be blood work, cultures and biopsy results pending at the time of your discharge.  Please request that your primary care M.D. goes through all the records of your hospital data and follows up on these results.   Also Note the following: If you experience worsening of your admission symptoms, develop shortness of breath, life threatening emergency, suicidal or homicidal thoughts you must seek medical attention immediately by calling 911 or calling your MD immediately  if symptoms less severe.   You must read complete instructions/literature along with all the possible adverse reactions/side effects for all the Medicines you take and that have been prescribed to you. Take any new Medicines after you have completely understood and accpet all the possible adverse reactions/side effects.    Do not drive when taking Pain medications or sleeping medications (Benzodaizepines)   Do not take more than prescribed Pain, Sleep and Anxiety Medications. It is not advisable to combine anxiety,sleep and pain medications without talking with your primary care practitioner   Special Instructions: If you have smoked or chewed Tobacco  in the last 2 yrs please stop smoking, stop any regular Alcohol  and or any Recreational drug use.   Wear Seat belts while driving.   Please note: You were cared for by a hospitalist during your hospital stay. Once you are discharged, your primary care physician will handle any further medical issues. Please note that NO REFILLS for any discharge medications will be authorized once you are discharged, as it is imperative that you return to your primary care physician (or establish a relationship with a primary care physician if you do not have one) for your post hospital discharge needs so that they can reassess your need for medications and monitor your lab values.  Time coordinating discharge: 40 minutes  SIGNED:  Pamella Pert, MD, PhD 07/16/2022, 3:21 PM

## 2022-07-16 NOTE — Progress Notes (Signed)
Discharge instructions discussed with patient and family, verbalized agreement and understanding 

## 2022-07-20 DIAGNOSIS — R1319 Other dysphagia: Secondary | ICD-10-CM | POA: Diagnosis not present

## 2022-07-20 DIAGNOSIS — Z20822 Contact with and (suspected) exposure to covid-19: Secondary | ICD-10-CM | POA: Diagnosis not present

## 2022-07-20 DIAGNOSIS — R933 Abnormal findings on diagnostic imaging of other parts of digestive tract: Secondary | ICD-10-CM | POA: Diagnosis not present

## 2022-07-21 DIAGNOSIS — M7502 Adhesive capsulitis of left shoulder: Secondary | ICD-10-CM | POA: Diagnosis not present

## 2022-07-21 DIAGNOSIS — Z20822 Contact with and (suspected) exposure to covid-19: Secondary | ICD-10-CM | POA: Diagnosis not present

## 2022-07-21 DIAGNOSIS — Z23 Encounter for immunization: Secondary | ICD-10-CM | POA: Diagnosis not present

## 2022-07-24 DIAGNOSIS — Z20822 Contact with and (suspected) exposure to covid-19: Secondary | ICD-10-CM | POA: Diagnosis not present

## 2022-07-25 DIAGNOSIS — Z20822 Contact with and (suspected) exposure to covid-19: Secondary | ICD-10-CM | POA: Diagnosis not present

## 2022-07-28 DIAGNOSIS — Z20822 Contact with and (suspected) exposure to covid-19: Secondary | ICD-10-CM | POA: Diagnosis not present

## 2022-07-29 DIAGNOSIS — Z20822 Contact with and (suspected) exposure to covid-19: Secondary | ICD-10-CM | POA: Diagnosis not present

## 2022-08-01 DIAGNOSIS — Z20822 Contact with and (suspected) exposure to covid-19: Secondary | ICD-10-CM | POA: Diagnosis not present

## 2022-08-02 DIAGNOSIS — Z20822 Contact with and (suspected) exposure to covid-19: Secondary | ICD-10-CM | POA: Diagnosis not present

## 2022-08-05 DIAGNOSIS — Z20822 Contact with and (suspected) exposure to covid-19: Secondary | ICD-10-CM | POA: Diagnosis not present

## 2022-08-06 DIAGNOSIS — R5383 Other fatigue: Secondary | ICD-10-CM | POA: Diagnosis not present

## 2022-08-06 DIAGNOSIS — D649 Anemia, unspecified: Secondary | ICD-10-CM | POA: Diagnosis not present

## 2022-08-06 DIAGNOSIS — Z23 Encounter for immunization: Secondary | ICD-10-CM | POA: Diagnosis not present

## 2022-08-06 DIAGNOSIS — Z20822 Contact with and (suspected) exposure to covid-19: Secondary | ICD-10-CM | POA: Diagnosis not present

## 2022-08-09 DIAGNOSIS — M7581 Other shoulder lesions, right shoulder: Secondary | ICD-10-CM | POA: Diagnosis not present

## 2022-08-10 DIAGNOSIS — Z23 Encounter for immunization: Secondary | ICD-10-CM | POA: Diagnosis not present

## 2022-08-10 DIAGNOSIS — R0600 Dyspnea, unspecified: Secondary | ICD-10-CM | POA: Diagnosis not present

## 2022-08-10 DIAGNOSIS — Z8616 Personal history of COVID-19: Secondary | ICD-10-CM | POA: Diagnosis not present

## 2022-08-10 DIAGNOSIS — K219 Gastro-esophageal reflux disease without esophagitis: Secondary | ICD-10-CM | POA: Diagnosis not present

## 2022-08-10 DIAGNOSIS — J4550 Severe persistent asthma, uncomplicated: Secondary | ICD-10-CM | POA: Diagnosis not present

## 2022-08-12 DIAGNOSIS — M25511 Pain in right shoulder: Secondary | ICD-10-CM | POA: Diagnosis not present

## 2022-08-12 DIAGNOSIS — K625 Hemorrhage of anus and rectum: Secondary | ICD-10-CM | POA: Diagnosis not present

## 2022-08-30 DIAGNOSIS — R0600 Dyspnea, unspecified: Secondary | ICD-10-CM | POA: Diagnosis not present

## 2022-08-30 DIAGNOSIS — R52 Pain, unspecified: Secondary | ICD-10-CM | POA: Diagnosis not present

## 2022-08-30 DIAGNOSIS — Z8616 Personal history of COVID-19: Secondary | ICD-10-CM | POA: Diagnosis not present

## 2022-08-30 DIAGNOSIS — J4550 Severe persistent asthma, uncomplicated: Secondary | ICD-10-CM | POA: Diagnosis not present

## 2022-08-30 DIAGNOSIS — R051 Acute cough: Secondary | ICD-10-CM | POA: Diagnosis not present

## 2022-08-30 DIAGNOSIS — K219 Gastro-esophageal reflux disease without esophagitis: Secondary | ICD-10-CM | POA: Diagnosis not present

## 2022-08-30 DIAGNOSIS — Z03818 Encounter for observation for suspected exposure to other biological agents ruled out: Secondary | ICD-10-CM | POA: Diagnosis not present

## 2022-09-15 ENCOUNTER — Encounter: Payer: Self-pay | Admitting: Oncology

## 2022-09-15 ENCOUNTER — Inpatient Hospital Stay: Payer: Medicare (Managed Care) | Attending: Oncology | Admitting: Oncology

## 2022-09-15 ENCOUNTER — Inpatient Hospital Stay: Payer: Medicare (Managed Care)

## 2022-09-15 VITALS — BP 136/94 | HR 94 | Temp 98.9°F | Resp 16 | Ht 66.9 in | Wt 130.8 lb

## 2022-09-15 DIAGNOSIS — R634 Abnormal weight loss: Secondary | ICD-10-CM

## 2022-09-15 DIAGNOSIS — K909 Intestinal malabsorption, unspecified: Secondary | ICD-10-CM

## 2022-09-15 DIAGNOSIS — L638 Other alopecia areata: Secondary | ICD-10-CM | POA: Diagnosis not present

## 2022-09-15 DIAGNOSIS — D508 Other iron deficiency anemias: Secondary | ICD-10-CM

## 2022-09-15 DIAGNOSIS — E611 Iron deficiency: Secondary | ICD-10-CM | POA: Insufficient documentation

## 2022-09-15 DIAGNOSIS — M2550 Pain in unspecified joint: Secondary | ICD-10-CM | POA: Diagnosis not present

## 2022-09-15 DIAGNOSIS — G8929 Other chronic pain: Secondary | ICD-10-CM

## 2022-09-15 DIAGNOSIS — K912 Postsurgical malabsorption, not elsewhere classified: Secondary | ICD-10-CM

## 2022-09-15 DIAGNOSIS — Z9049 Acquired absence of other specified parts of digestive tract: Secondary | ICD-10-CM | POA: Insufficient documentation

## 2022-09-15 DIAGNOSIS — L659 Nonscarring hair loss, unspecified: Secondary | ICD-10-CM | POA: Insufficient documentation

## 2022-09-15 DIAGNOSIS — E440 Moderate protein-calorie malnutrition: Secondary | ICD-10-CM | POA: Diagnosis not present

## 2022-09-15 DIAGNOSIS — M25551 Pain in right hip: Secondary | ICD-10-CM

## 2022-09-15 DIAGNOSIS — T148XXA Other injury of unspecified body region, initial encounter: Secondary | ICD-10-CM

## 2022-09-15 DIAGNOSIS — I2510 Atherosclerotic heart disease of native coronary artery without angina pectoris: Secondary | ICD-10-CM | POA: Diagnosis not present

## 2022-09-15 DIAGNOSIS — R627 Adult failure to thrive: Secondary | ICD-10-CM | POA: Diagnosis not present

## 2022-09-15 DIAGNOSIS — M25562 Pain in left knee: Secondary | ICD-10-CM

## 2022-09-15 DIAGNOSIS — M25552 Pain in left hip: Secondary | ICD-10-CM

## 2022-09-15 DIAGNOSIS — M25561 Pain in right knee: Secondary | ICD-10-CM

## 2022-09-15 LAB — CBC WITH DIFFERENTIAL/PLATELET
Abs Immature Granulocytes: 0.17 10*3/uL — ABNORMAL HIGH (ref 0.00–0.07)
Basophils Absolute: 0.1 10*3/uL (ref 0.0–0.1)
Basophils Relative: 1 %
Eosinophils Absolute: 0 10*3/uL (ref 0.0–0.5)
Eosinophils Relative: 1 %
HCT: 47.4 % (ref 39.0–52.0)
Hemoglobin: 15.4 g/dL (ref 13.0–17.0)
Immature Granulocytes: 2 %
Lymphocytes Relative: 17 %
Lymphs Abs: 1.5 10*3/uL (ref 0.7–4.0)
MCH: 33.3 pg (ref 26.0–34.0)
MCHC: 32.5 g/dL (ref 30.0–36.0)
MCV: 102.6 fL — ABNORMAL HIGH (ref 80.0–100.0)
Monocytes Absolute: 0.7 10*3/uL (ref 0.1–1.0)
Monocytes Relative: 7 %
Neutro Abs: 6.3 10*3/uL (ref 1.7–7.7)
Neutrophils Relative %: 72 %
Platelets: 222 10*3/uL (ref 150–400)
RBC: 4.62 MIL/uL (ref 4.22–5.81)
RDW: 13.2 % (ref 11.5–15.5)
WBC: 8.8 10*3/uL (ref 4.0–10.5)
nRBC: 0 % (ref 0.0–0.2)

## 2022-09-15 LAB — CMP (CANCER CENTER ONLY)
ALT: 33 U/L (ref 0–44)
AST: 26 U/L (ref 15–41)
Albumin: 3.7 g/dL (ref 3.5–5.0)
Alkaline Phosphatase: 69 U/L (ref 38–126)
Anion gap: 9 (ref 5–15)
BUN: 12 mg/dL (ref 6–20)
CO2: 26 mmol/L (ref 22–32)
Calcium: 9.1 mg/dL (ref 8.9–10.3)
Chloride: 102 mmol/L (ref 98–111)
Creatinine: 0.67 mg/dL (ref 0.61–1.24)
GFR, Estimated: >60 mL/min (ref >60)
Glucose, Bld: 79 mg/dL (ref 70–99)
Potassium: 3.5 mmol/L (ref 3.5–5.1)
Sodium: 137 mmol/L (ref 135–145)
Total Bilirubin: 1 mg/dL (ref 0.3–1.2)
Total Protein: 7.1 g/dL (ref 6.5–8.1)

## 2022-09-15 LAB — VITAMIN D 25 HYDROXY (VIT D DEFICIENCY, FRACTURES): Vit D, 25-Hydroxy: 54.67 ng/mL (ref 30–100)

## 2022-09-15 LAB — VITAMIN B12: Vitamin B-12: 4981 pg/mL — ABNORMAL HIGH (ref 180–914)

## 2022-09-15 LAB — APTT: aPTT: 24 seconds (ref 24–36)

## 2022-09-15 LAB — FOLATE: Folate: 30 ng/mL (ref >5.9)

## 2022-09-15 LAB — PHOSPHORUS: Phosphorus: 2.5 mg/dL (ref 2.5–4.6)

## 2022-09-15 LAB — FERRITIN: Ferritin: 117 ng/mL (ref 24–336)

## 2022-09-15 LAB — PROTIME-INR
INR: 0.9 (ref 0.8–1.2)
Prothrombin Time: 11.5 seconds (ref 11.4–15.2)

## 2022-09-15 LAB — FIBRINOGEN: Fibrinogen: 557 mg/dL — ABNORMAL HIGH (ref 210–475)

## 2022-09-15 MED ORDER — HEPARIN SOD (PORK) LOCK FLUSH 100 UNIT/ML IV SOLN
500.0000 [IU] | Freq: Once | INTRAVENOUS | Status: AC | PRN
  Administered 2022-09-15: 500 [IU]

## 2022-09-15 MED ORDER — SODIUM CHLORIDE 0.9% FLUSH
10.0000 mL | INTRAVENOUS | Status: AC | PRN
  Administered 2022-09-15: 10 mL

## 2022-09-15 NOTE — Addendum Note (Signed)
Addended byGeorgette Shell on: 09/15/2022 12:36 PM   Modules accepted: Orders

## 2022-09-15 NOTE — Addendum Note (Signed)
Addended byGeorgette Shell on: 09/15/2022 02:14 PM   Modules accepted: Orders

## 2022-09-15 NOTE — Progress Notes (Signed)
[facility] Cancer Initial Visit:  Patient Care Team: Nodal, Joline Salt, PA-C as PCP - General (Physician Assistant)  CHIEF COMPLAINTS/PURPOSE OF CONSULTATION:  Oncology History   No history exists.    HISTORY OF PRESENTING ILLNESS: Edwin Hall 56 y.o. male is here because of easy bruising.  Medical history notable for CAD, GERD, Barrett Esophagus, recurrent hiatal hernia  Patient has a complex surgical history Open appendectomy, open cholecystectomy in the 80s/90s.  Open Nissen fundoplication around 1997 in [place].   Recurrent hiatal hernia repair with re-do fundoplication in the 2000s via the chest?(?thoracoscopic PEH repair) followed by another one via abdominal approach Open ventral hernia repair in the 2000s with mesh placement Open pyloroplasty with PGJ placement by Dr. Jossie Ng in 2017. Recurrent hiatal hernia with colon herniating through the hiatus and had left thoracotomy, extensive lysis of pleural adhesions, primary diaphragmatic repair and partial 7th rib resection in 2021 Pneumothorax in July 2021 following left thoracotomy for hiatal hernia repair in June 2021.   Developed progressive dysphagia and heartburn requiring EGD/dilation.  EGD 08/04/21 - long segment Barrett's, ?loose [place], evidence of pyloroplasty Manometry 05/01/2021 - IR 4.4, DCI 1598, 100% intact swallow. ? 3.4 cm hiatal hernia. Barium swallow 02/09/21 - spontaneous GERD, tablet did not pass into stomach, small hiatal hernia. Esophageal dysmotility  Ivor Lewis Esophagectomy in January 2023 course complicated by septic shock and respiratory failure.  Hospitalized for 3 months and discharged to rehab  April 30, 2022: WBC 7.9 hemoglobin 15.6 platelet count 215; 67 segs 24 lymphs 7 monos 1 EO 1 basophil  July 02 2022:  CT chest.  Post esophagectomy with gastric pull-through without evidence of complication. No evidence of volvulus or obstruction. Subtle patchy and bandlike peribronchovascular  groundglass opacity in the upper lobes and right middle lobe compatible with post diffuse alveolar damage recovery.  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 Recommend correlation for subclavian steal  August 06, 2022 B12 greater than 1500 Ferritin 64   September 15 2022:  [facility] Hematology Consult Reviewed surgical history with patient.   During the past month noted that he has developed increased bruising such that it occurs on his thighs when he crosses his legs.  He has bruising on forearms.  Developed a bruise on his heel the other day and can not recall any particular trauma.  Began taking ASA 81 mg daily a few years ago which he is still taking.  Takes lyrica PRN for pain. Not taking any other NSAIDS.  He is not aware of having any bleeding complications with any of the multiple surgeries he has undergone.  Received PRBC's in the past.  Has not received IV iron but is taking oral iron.  No epistaxis, gingival bleeding, no hematuria.  No hematochezia, or melena.  No joint or deep muscle bleeding.   Fatigued and has difficulty getting up the steps to his apartment.  Intolerant to cold.  Has developed hair loss since surgery in January   Had colonoscopy > 5 yrs ago which per patient was negative.    Social:  Single.  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  No tobacco or EtOH  [facility] Mother alive 41 dementia Father died 43's heart problems Brother alive 42 multiple medical problems Sister alive 73 well   Review of Systems  Constitutional:  Positive for fatigue and unexpected weight change. Negative for fever.  Has to make himself eat.  Has lost 23 lbs since surgery in January 2023  HENT:   Positive for trouble swallowing and voice change. Negative for mouth sores, nosebleeds and sore throat.        Voice a lot higher now.    Eyes:  Negative for eye  problems and icterus.       Vision changes:  None  Respiratory:  Positive for cough. Negative for chest tightness, hemoptysis, shortness of breath and wheezing.        PND:  none Orthopnea:  none DOE:    Cardiovascular:  Negative for chest pain, leg swelling and palpitations.       Swelling in left foot  Gastrointestinal:  Positive for abdominal pain, blood in stool and nausea. Negative for abdominal distention, constipation, diarrhea and vomiting.       Feels tight in cervical phase of swallowing Periumbilical pain after eating despite carafate No hematochezia recently Nausea after eating  Endocrine: Negative for hot flashes.       Cold intolerance:  none Heat intolerance:  none  Genitourinary:  Positive for frequency and nocturia. Negative for bladder incontinence, difficulty urinating, dysuria and hematuria.   Musculoskeletal:  Positive for arthralgias, back pain, gait problem and myalgias. Negative for neck pain and neck stiffness.  Skin:  Negative for itching, rash and wound.  Neurological:  Positive for extremity weakness and gait problem. Negative for dizziness, headaches, light-headedness, numbness and speech difficulty.       Gait problems due to arthralgias in hips  Hematological:  Negative for adenopathy. Bruises/bleeds easily.  Psychiatric/Behavioral:  Positive for depression and sleep disturbance. Negative for suicidal ideas. The patient is not nervous/anxious.        Sleeps poorly due to arthralgias and myalgias    MEDICAL HISTORY: Past Medical History:  Diagnosis Date   Complication of anesthesia    nasuea and vomiting   Food allergy    Headache    Hiatal hernia    Hyperlipidemia    IBS (irritable bowel syndrome)    Iron deficiency anemia    Urticaria     SURGICAL HISTORY: Past Surgical History:  Procedure Laterality Date   ABDOMINAL SURGERY  12/2015   pylorus plasty   APPENDECTOMY  1984   BIOPSY  05/18/2020   Procedure: BIOPSY;  Surgeon: Ronnette Juniper, MD;   Location: [facility];  Service: Gastroenterology;;   CHOLECYSTECTOMY  2008   ESOPHAGOGASTRODUODENOSCOPY (EGD) WITH PROPOFOL N/A 05/18/2020   Procedure: ESOPHAGOGASTRODUODENOSCOPY (EGD) WITH PROPOFOL;  Surgeon: Ronnette Juniper, MD;  Location: [facility];  Service: Gastroenterology;  Laterality: N/A;   GASTROSTOMY TUBE PLACEMENT     HIATAL HERNIA REPAIR  07/2015   TALC PLEURODESIS     TONSILLECTOMY  1980   VENTRAL HERNIA REPAIR  2010    SOCIAL HISTORY: Social History   Socioeconomic History   Marital status: Single    Spouse name: Not on file   Number of children: Not on file   Years of education: Not on file   Highest education level: Not on file  Occupational History   Not on file  Tobacco Use   Smoking status: Never   Smokeless tobacco: Never  Vaping Use   Vaping Use: Never used  Substance and Sexual Activity   Alcohol use: No   Drug use: No   Sexual activity: Not on file  Other Topics Concern   Not on file  Social History Narrative   Not on file   Social Determinants of  Health   Financial Resource Strain: Not on file  Food Insecurity: No Food Insecurity (10/13/2020)   Hunger Vital Sign    Worried About Running Out of Food in the Last Year: Never true    Ran Out of Food in the Last Year: Never true  Transportation Needs: No Transportation Needs (10/13/2020)   PRAPARE - Administrator, Civil ServiceTransportation    Lack of Transportation (Medical): No    Lack of Transportation (Non-Medical): No  Physical Activity: Not on file  Stress: Not on file  Social Connections: Not on file  Intimate Partner Violence: Not on file    FAMILY HISTORY Family History  Problem Relation Age of Onset   Parkinson's disease Father    Heart disease Father    Angioedema Mother    Allergic rhinitis Neg Hx    Asthma Neg Hx    Eczema Neg Hx    Immunodeficiency Neg Hx    Urticaria Neg Hx     ALLERGIES:  is allergic to contrast media [iodinated contrast media], mushroom extract complex, other, shellfish  allergy, reglan [metoclopramide], iodine, tetracyclines & related, and zofran [ondansetron hcl].  MEDICATIONS:  Current Outpatient Medications  Medication Sig Dispense Refill   acetaminophen (TYLENOL) 500 MG tablet Take 1,000 mg by mouth every 6 (six) hours as needed for moderate pain.     aspirin 81 MG EC tablet Take 81 mg by mouth at bedtime. Swallow whole.     chlorpheniramine-HYDROcodone (TUSSIONEX) 10-8 MG/5ML Take 5 mLs by mouth every 12 (twelve) hours as needed for cough.     Cholecalciferol (VITAMIN D3) 10 MCG (400 UNIT) CAPS Take 400 Units by mouth daily.     Cyanocobalamin (VITAMIN B 12 PO) Take 1,000 mcg by mouth daily.     cyclobenzaprine (FLEXERIL) 10 MG tablet Take 10 mg by mouth 3 (three) times daily as needed for muscle spasms.     famotidine (PEPCID) 40 MG tablet Take 40 mg by mouth 2 (two) times daily.     Ferrous Sulfate (IRON) 325 (65 Fe) MG TABS Take 325 mg by mouth every other day.      HYDROmorphone (DILAUDID) 2 MG tablet Take 1 tablet (2 mg total) by mouth every 6 (six) hours as needed for severe pain. 20 tablet 0   ipratropium (ATROVENT) 0.06 % nasal spray USE TWO SPRAYS IN EACH NOSTRIL TWICE DAILY AS DIRECTED. (Patient taking differently: Place 1 spray into both nostrils daily as needed for rhinitis.) 15 mL 0   LINZESS 290 MCG CAPS capsule Take 290 mcg by mouth daily as needed (abdominal cramping).      Magnesium 250 MG TABS Take 250 mg by mouth daily.     metoCLOPramide (REGLAN) 10 MG tablet Take 10 mg by mouth 2 (two) times daily before a meal.     montelukast (SINGULAIR) 10 MG tablet Take 10 mg by mouth daily as needed (allergies).     Multiple Vitamin (MULTIVITAMIN WITH MINERALS) TABS tablet Take 1 tablet by mouth daily.     pantoprazole (PROTONIX) 40 MG tablet Take 40 mg by mouth 2 (two) times daily.     PROAIR DIGIHALER 108 (90 Base) MCG/ACT AEPB Take 2 puffs by mouth every 4 (four) hours as needed (sob/wheezing).     Probiotic Product (PROBIOTIC ADVANCED) CAPS  Take 1 capsule by mouth 2 (two) times daily.     promethazine (PHENERGAN) 25 MG tablet Take 25 mg by mouth every 6 (six) hours as needed for nausea or vomiting.     rosuvastatin (CRESTOR)  20 MG tablet Take 20 mg by mouth at bedtime.     sucralfate (CARAFATE) 1 g tablet Take 1 tablet (1 g total) by mouth 4 (four) times daily -  with meals and at bedtime. 42 tablet 0   traZODone (DESYREL) 50 MG tablet Take 50 mg by mouth at bedtime.     Zinc 50 MG TABS Take 50 mg by mouth daily.      No current facility-administered medications for this visit.    PHYSICAL EXAMINATION:  ECOG PERFORMANCE STATUS: 1 - Symptomatic but completely ambulatory   There were no vitals filed for this visit.  There were no vitals filed for this visit.   Physical Exam Vitals and nursing note reviewed.  Constitutional:      Appearance: Normal appearance. He is normal weight. He is not diaphoretic.  HENT:     Head: Normocephalic and atraumatic.     Right Ear: External ear normal.     Left Ear: External ear normal.     Nose: Nose normal.  Eyes:     General: No scleral icterus.    Conjunctiva/sclera: Conjunctivae normal.     Pupils: Pupils are equal, round, and reactive to light.  Cardiovascular:     Rate and Rhythm: Normal rate and regular rhythm.     Heart sounds: Normal heart sounds. No murmur heard.    No friction rub. No gallop.  Pulmonary:     Effort: Pulmonary effort is normal. No respiratory distress.     Breath sounds: Normal breath sounds. No stridor.  Abdominal:     General: Bowel sounds are normal. There is no distension.     Palpations: Abdomen is soft. There is no mass.  Musculoskeletal:        General: No swelling, tenderness, deformity or signs of injury. Normal range of motion.     Cervical back: Normal range of motion and neck supple. No rigidity or tenderness.  Lymphadenopathy:     Head:     Right side of head: No submental, submandibular, tonsillar, preauricular, posterior auricular or  occipital adenopathy.     Left side of head: No submental, submandibular, tonsillar, preauricular, posterior auricular or occipital adenopathy.     Cervical: No cervical adenopathy.     Right cervical: No superficial, deep or posterior cervical adenopathy.    Left cervical: No superficial, deep or posterior cervical adenopathy.     Upper Body:     Right upper body: No supraclavicular or axillary adenopathy.     Left upper body: No supraclavicular or axillary adenopathy.     Lower Body: No right inguinal adenopathy. No left inguinal adenopathy.  Skin:    Coloration: Skin is not jaundiced or pale.  Neurological:     General: No focal deficit present.     Mental Status: He is alert and oriented to person, place, and time.  Psychiatric:        Mood and Affect: Mood normal.        Behavior: Behavior normal.        Thought Content: Thought content normal.        Judgment: Judgment normal.      LABORATORY DATA: I have personally reviewed the data as listed:  No visits with results within 1 Month(s) from this visit.  Latest known visit with results is:  Admission on 07/13/2022, Discharged on 07/16/2022  Component Date Value Ref Range Status   Sodium 07/14/2022 137  135 - 145 mmol/L Final   Potassium 07/14/2022 3.9  3.5 - 5.1 mmol/L Final   Chloride 07/14/2022 107  98 - 111 mmol/L Final   CO2 07/14/2022 25  22 - 32 mmol/L Final   Glucose, Bld 07/14/2022 118 (H)  70 - 99 mg/dL Final   Glucose reference range applies only to samples taken after fasting for at least 8 hours.   BUN 07/14/2022 22 (H)  6 - 20 mg/dL Final   Creatinine, Ser 07/14/2022 0.86  0.61 - 1.24 mg/dL Final   Calcium 42/70/6237 8.1 (L)  8.9 - 10.3 mg/dL Final   GFR, Estimated 07/14/2022 >60  >60 mL/min Final   Comment: (NOTE) Calculated using the CKD-EPI Creatinine Equation (2021)    Anion gap 07/14/2022 5  5 - 15 Final   Performed at [facility], [address] [place], [place] [address]   WBC  07/14/2022 9.0  4.0 - 10.5 K/uL Final   RBC 07/14/2022 4.42  4.22 - 5.81 MIL/uL Final   Hemoglobin 07/14/2022 14.1  13.0 - 17.0 g/dL Final   HCT 51/76/1607 41.4  39.0 - 52.0 % Final   MCV 07/14/2022 93.7  80.0 - 100.0 fL Final   MCH 07/14/2022 31.9  26.0 - 34.0 pg Final   MCHC 07/14/2022 34.1  30.0 - 36.0 g/dL Final   RDW 37/08/6268 13.2  11.5 - 15.5 % Final   Platelets 07/14/2022 191  150 - 400 K/uL Final   nRBC 07/14/2022 0.0  0.0 - 0.2 % Final   Performed at [facility], [address] [place], [place] [address]   Troponin I (High Sensitivity) 07/14/2022 10  <18 ng/L Final   Comment: (NOTE) Elevated high sensitivity troponin I (hsTnI) values and significant  changes across serial measurements may suggest ACS but many other  chronic and acute conditions are known to elevate hsTnI results.  Refer to the "Links" section for chest pain algorithms and additional  guidance. Performed at [facility], [address], [place], [place] [address]    Troponin I (High Sensitivity) 07/14/2022 10  <18 ng/L Final   Comment: (NOTE) Elevated high sensitivity troponin I (hsTnI) values and significant  changes across serial measurements may suggest ACS but many other  chronic and acute conditions are known to elevate hsTnI results.  Refer to the "Links" section for chest pain algorithms and additional  guidance. Performed at [facility], [address], [place], [place] [address]    Total Protein 07/14/2022 5.0 (L)  6.5 - 8.1 g/dL Final   Albumin 18/29/9371 2.8 (L)  3.5 - 5.0 g/dL Final   AST 69/67/8938 35  15 - 41 U/L Final   ALT 07/14/2022 68 (H)  0 - 44 U/L Final   Alkaline Phosphatase 07/14/2022 38  38 - 126 U/L Final   Total Bilirubin 07/14/2022 0.6  0.3 - 1.2 mg/dL Final   Bilirubin, Direct 07/14/2022 0.2  0.0 - 0.2 mg/dL Final   Indirect Bilirubin 07/14/2022 0.4  0.3 - 0.9 mg/dL Final   Performed at [facility], [address], [place], [place] [address]   Lipase 07/14/2022 25  11 - 51 U/L Final   Performed at [facility], [address] [place], [place] [address]   HIV Screen 4th Generation wRfx 07/15/2022 Non Reactive  Non Reactive Final   Performed at [facility], [address], [place], [place] [address]   Sodium 07/15/2022 136  135 - 145 mmol/L Final   Potassium 07/15/2022 4.0  3.5 - 5.1 mmol/L Final   Chloride 07/15/2022 102  98 - 111 mmol/L Final   CO2 07/15/2022 27  22 - 32 mmol/L Final   Glucose, Bld 07/15/2022 103 (H)  70 - 99 mg/dL Final   Glucose reference range applies only to samples taken after fasting for at least 8 hours.   BUN 07/15/2022 10  6 - 20 mg/dL Final   Creatinine, Ser 07/15/2022 0.66  0.61 - 1.24 mg/dL Final   Calcium 61/60/7371 8.4 (L)  8.9 - 10.3 mg/dL Final   Total Protein 05/10/9484 5.5 (L)  6.5 - 8.1 g/dL Final   Albumin 46/27/0350 3.2 (L)  3.5 - 5.0 g/dL Final   AST 09/38/1829 77 (H)  15 - 41 U/L Final   ALT 07/15/2022 187 (H)  0 - 44 U/L Final   Alkaline Phosphatase 07/15/2022 82  38 - 126 U/L Final   Total Bilirubin 07/15/2022 1.0  0.3 - 1.2 mg/dL Final   GFR, Estimated 07/15/2022 >60  >60 mL/min Final   Comment: (NOTE) Calculated using the CKD-EPI Creatinine Equation (2021)    Anion gap 07/15/2022 7  5 - 15 Final   Performed at [facility], [address], [place], [place] [address]   WBC 07/15/2022 6.7  4.0 - 10.5 K/uL Final   RBC 07/15/2022 4.26  4.22 - 5.81 MIL/uL Final   Hemoglobin 07/15/2022 13.7  13.0 - 17.0 g/dL Final   HCT 96/78/9381 40.6  39.0 - 52.0 % Final   MCV 07/15/2022 95.3  80.0 - 100.0 fL Final   MCH 07/15/2022 32.2  26.0 - 34.0 pg Final   MCHC 07/15/2022 33.7  30.0 - 36.0 g/dL Final   RDW 01/75/1025 13.3  11.5 - 15.5 % Final   Platelets 07/15/2022 173  150 - 400 K/uL Final   nRBC 07/15/2022 0.0  0.0 - 0.2 % Final   Performed at [facility], [address], [place], [place] [address]   Color, Urine 07/15/2022 STRAW (A)  YELLOW Final   APPearance 07/15/2022 CLEAR  CLEAR Final   Specific Gravity, Urine 07/15/2022 1.004 (L)  1.005 - 1.030 Final   pH 07/15/2022 7.0  5.0 - 8.0 Final   Glucose, UA 07/15/2022 NEGATIVE  NEGATIVE mg/dL Final   Hgb urine dipstick 07/15/2022 NEGATIVE  NEGATIVE Final   Bilirubin Urine 07/15/2022 NEGATIVE  NEGATIVE Final   Ketones, ur 07/15/2022 NEGATIVE  NEGATIVE mg/dL Final   Protein, ur 82/42/3536 NEGATIVE  NEGATIVE mg/dL Final   Nitrite 14/43/1540 NEGATIVE  NEGATIVE Final   Leukocytes,Ua 07/15/2022 NEGATIVE  NEGATIVE Final   Performed at [facility], [address], [place], [place] [address]   Sodium 07/16/2022 137  135 - 145 mmol/L Final   Potassium 07/16/2022 4.4  3.5 - 5.1 mmol/L Final   Chloride 07/16/2022 103  98 - 111 mmol/L Final   CO2 07/16/2022 27  22 - 32 mmol/L Final   Glucose, Bld 07/16/2022 118 (H)  70 - 99 mg/dL Final   Glucose reference range applies only to samples taken after fasting for at least 8 hours.   BUN 07/16/2022 9  6 - 20 mg/dL Final   Creatinine, Ser 07/16/2022 0.63  0.61 - 1.24 mg/dL Final   Calcium 19/50/9326 8.5 (L)  8.9 - 10.3 mg/dL Final   Total Protein 71/24/5809 5.0 (L)  6.5 - 8.1 g/dL Final   Albumin 98/33/8250 2.8 (L)  3.5 - 5.0 g/dL Final   AST 53/97/6734 33  15 - 41 U/L Final   ALT 07/16/2022 127 (H)  0 - 44 U/L Final   Alkaline Phosphatase 07/16/2022 63  38 - 126 U/L Final   Total Bilirubin 07/16/2022 0.8  0.3 - 1.2 mg/dL Final   GFR, Estimated 07/16/2022 >60  >60 mL/min Final   Comment: (NOTE) Calculated using the CKD-EPI Creatinine Equation (2021)    Anion gap 07/16/2022 7  5 - 15 Final   Performed at [facility], [address], [place], [place] [address]   Magnesium 07/16/2022 1.9  1.7 - 2.4 mg/dL Final   Performed at [facility], [address] [place], [place] [address]   WBC 07/16/2022 7.3  4.0 - 10.5  K/uL Final   RBC 07/16/2022 3.96 (L)  4.22 - 5.81 MIL/uL Final   Hemoglobin 07/16/2022 12.9 (L)  13.0 - 17.0 g/dL Final   HCT 09/81/1914 38.3 (L)  39.0 - 52.0 % Final   MCV 07/16/2022 96.7  80.0 - 100.0 fL Final   MCH 07/16/2022 32.6  26.0 - 34.0 pg Final   MCHC 07/16/2022 33.7  30.0 - 36.0 g/dL Final   RDW 78/29/5621 13.5  11.5 - 15.5 % Final   Platelets 07/16/2022 154  150 - 400 K/uL Final   nRBC 07/16/2022 0.0  0.0 - 0.2 % Final   Performed at [facility], [address] [place], [place] [address]    RADIOGRAPHIC STUDIES: I have personally reviewed the radiological images as listed and agree with the findings in the report  No results found.  ASSESSMENT/PLAN 56 year old male with CAD who has a complicated history of upper GI surgeries which culminated in a gastrectomy performed in January 2023 complicated by a prolonged postoperative course.    Iron deficiency:  Secondary poor iron absorption but may have ongoing GI blood loss.    Increased bruising:  This is a new event.   Patient's bleeding history has been benign especially when considering the number of surgical procedures he has undergone. May be due to deficiency of vitamin K dependent factors, decreased tissue integrity from malnutrition and contribution from ASA.  Will check CBC with diff, CMP, Vitamin D level, PT/PTT/ von Willebrand profile.    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  Check vitamin D level, Calcium, Phosphate.  Refer for DEXA scan.   Consider Rheumatologic evaluation in the future   Cancer Staging  No matching staging information was found for the patient.   No problem-specific Assessment & Plan notes found for this encounter.   No orders of the defined types were placed in this encounter.   All questions were answered. The patient knows to call the clinic with any problems, questions or concerns.  This note was electronically signed.    Loni Muse, MD  09/15/2022 8:36 AM

## 2022-09-16 ENCOUNTER — Telehealth: Payer: Self-pay | Admitting: Oncology

## 2022-09-16 NOTE — Telephone Encounter (Signed)
Scheduled appointment per 11/01. Patient is aware of appointment date and time. Patient is aware to arrive 15 mins prior to appointment time and to bring updated insurance cards. Patient is aware of location.   

## 2022-09-17 LAB — VON WILLEBRAND PANEL
Coagulation Factor VIII: 301 % — ABNORMAL HIGH (ref 56–140)
Ristocetin Co-factor, Plasma: 338 % — ABNORMAL HIGH (ref 50–200)
Von Willebrand Antigen, Plasma: 478 % — ABNORMAL HIGH (ref 50–200)

## 2022-09-17 LAB — COAG STUDIES INTERP REPORT

## 2022-09-18 LAB — VITAMIN B6: Vitamin B6: 19.3 ug/L (ref 3.4–65.2)

## 2022-09-20 LAB — VITAMIN B1: Vitamin B1 (Thiamine): 96.6 nmol/L (ref 66.5–200.0)

## 2022-09-20 LAB — VITAMIN E
Vitamin E (Alpha Tocopherol): 12.4 mg/L (ref 7.0–25.1)
Vitamin E(Gamma Tocopherol): 0.6 mg/L (ref 0.5–5.5)

## 2022-09-20 LAB — VITAMIN A: Vitamin A (Retinoic Acid): 49.6 ug/dL (ref 20.1–62.0)

## 2022-09-22 ENCOUNTER — Inpatient Hospital Stay (INDEPENDENT_AMBULATORY_CARE_PROVIDER_SITE_OTHER): Payer: Medicare (Managed Care) | Admitting: Oncology

## 2022-09-22 ENCOUNTER — Encounter: Payer: Self-pay | Admitting: Oncology

## 2022-09-22 VITALS — BP 146/97 | HR 95 | Temp 98.1°F | Resp 14 | Ht 66.9 in | Wt 126.4 lb

## 2022-09-22 DIAGNOSIS — D508 Other iron deficiency anemias: Secondary | ICD-10-CM

## 2022-09-22 DIAGNOSIS — F329 Major depressive disorder, single episode, unspecified: Secondary | ICD-10-CM

## 2022-09-22 DIAGNOSIS — F32A Depression, unspecified: Secondary | ICD-10-CM | POA: Insufficient documentation

## 2022-09-22 DIAGNOSIS — K909 Intestinal malabsorption, unspecified: Secondary | ICD-10-CM | POA: Diagnosis not present

## 2022-09-22 DIAGNOSIS — T148XXA Other injury of unspecified body region, initial encounter: Secondary | ICD-10-CM

## 2022-09-22 DIAGNOSIS — R634 Abnormal weight loss: Secondary | ICD-10-CM | POA: Diagnosis not present

## 2022-09-22 DIAGNOSIS — R627 Adult failure to thrive: Secondary | ICD-10-CM

## 2022-09-22 DIAGNOSIS — E440 Moderate protein-calorie malnutrition: Secondary | ICD-10-CM

## 2022-09-22 NOTE — Progress Notes (Signed)
[facility] Cancer Initial Visit:  Patient Care Team: Nodal, Joline Salt, PA-C as PCP - General (Physician Assistant)  CHIEF COMPLAINTS/PURPOSE OF CONSULTATION:  Oncology History   No history exists.    HISTORY OF PRESENTING ILLNESS: Edwin Hall 56 y.o. male is here because of easy bruising.  Medical history notable for CAD, GERD, Barrett Esophagus, recurrent hiatal hernia  Patient has a complex surgical history Open appendectomy, open cholecystectomy in the 80s/90s.  Open Nissen fundoplication around 1997 in [place].   Recurrent hiatal hernia repair with re-do fundoplication in the 2000s via the chest?(?thoracoscopic PEH repair) followed by another one via abdominal approach Open ventral hernia repair in the 2000s with mesh placement Open pyloroplasty with PGJ placement by Dr. Jossie Ng in 2017. Recurrent hiatal hernia with colon herniating through the hiatus and had left thoracotomy, extensive lysis of pleural adhesions, primary diaphragmatic repair and partial 7th rib resection in 2021 Pneumothorax in July 2021 following left thoracotomy for hiatal hernia repair in June 2021.   Developed progressive dysphagia and heartburn requiring EGD/dilation.  EGD 08/04/21 - long segment Barrett's, ?loose [place], evidence of pyloroplasty Manometry 05/01/2021 - IR 4.4, DCI 1598, 100% intact swallow. ? 3.4 cm hiatal hernia. Barium swallow 02/09/21 - spontaneous GERD, tablet did not pass into stomach, small hiatal hernia. Esophageal dysmotility  Ivor Lewis Esophagectomy in January 2023 course complicated by septic shock and respiratory failure.  Hospitalized for 3 months and discharged to rehab  April 30, 2022: WBC 7.9 hemoglobin 15.6 platelet count 215; 67 segs 24 lymphs 7 monos 1 EO 1 basophil  July 02 2022:  CT chest.  Post esophagectomy with gastric pull-through without evidence of complication. No evidence of volvulus or obstruction. Subtle patchy and bandlike peribronchovascular  groundglass opacity in the upper lobes and right middle lobe compatible with post diffuse alveolar damage recovery.  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 Recommend correlation for subclavian steal  August 06, 2022 B12 greater than 1500 Ferritin 64  September 01 2022:  CT AP.  No acute abnormality in the abdomen or pelvis.  Again noted is a hiatal hernia with a portion of the transverse  colon extending into the lower chest. No evidence for bowel  obstruction or inflammatory changes.  Postsurgical changes in the left lower lobe with elevation of the left hemidiaphragm.  Aortic Atherosclerosis   September 15 2022:  [facility]emat[facility] Consult Reviewed surgical history with patient.   During the past month noted that he has developed increased bruising such that it occurs on his thighs when he crosses his legs.  He has bruising on forearms.  Developed a bruise on his heel the other day and can not recall any particular trauma.  Began taking ASA 81 mg daily a few years ago which he is still taking.  Takes lyrica PRN for pain. Not taking any other NSAIDS.  He is not aware of having any bleeding complications with any of the multiple surgeries he has undergone.  Received PRBC's in the past.  Has not received IV iron but is taking oral iron.  No epistaxis, gingival bleeding, no hematuria.  No hematochezia, or melena.  No joint or deep muscle bleeding.   Fatigued and has difficulty getting up the steps to his apartment.  Intolerant to cold.  Has developed hair loss since surgery in January   Had colonoscopy > 5 yrs ago which per patient was negative.  Social:  Single.  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  No tobacco or EtOH  [facility] Mother alive 81 dementia Father died 51's heart problems Brother alive 48 multiple medical problems Sister alive 103 well  WBC 8.8  hemoglobin 15.4 MCV 103 platelet count 222; 72 segs 17 lymphs 7 monos 1 EO 1 basophil INR 0.9 PTT 24 fibrinogen 557 Factor VIII activity 301 von Willebrand factor antigen 478 von Willebrand factor activity 338 Vitamin E (alpha) 12.4 vitamin E (Gamma) 0.6 (lower limit normal 0.5) Vitamin A 49.6 Vitamin B1 96.6 Vitamin B6 19.3 Vitamin B12 4981 folate 30 ferritin 117 Vitamin D, 25-hydroxy 54.67 Phosphorus 2.5 (LLN 2.5)  Copper and zinc levels not available  September 22 2022:  Scheduled follow up regarding increased bruising and malabsorption Reviewed results of labs with patient.  He is not sleeping well.  Appetite still poor.  Anorectic.  Fatigued.  Can't get comfortable to sleep.  Still bruising.  Takes ASA 81 mg daily.   DEXA scan not performed yet Has lost 4 lbs.    Review of Systems  Constitutional:  Positive for fatigue and unexpected weight change. Negative for fever.       Has to make himself eat.  Has lost 23 lbs since surgery in January 2023  HENT:   Positive for trouble swallowing and voice change. Negative for mouth sores, nosebleeds and sore throat.        Voice a lot higher now.    Eyes:  Negative for eye problems and icterus.       Vision changes:  None  Respiratory:  Positive for cough. Negative for chest tightness, hemoptysis, shortness of breath and wheezing.        PND:  none Orthopnea:  none DOE:    Cardiovascular:  Negative for chest pain, leg swelling and palpitations.       Swelling in left foot  Gastrointestinal:  Positive for abdominal pain, blood in stool and nausea. Negative for abdominal distention, constipation, diarrhea and vomiting.       Feels tight in cervical phase of swallowing Periumbilical pain after eating despite carafate No hematochezia recently Nausea after eating  Endocrine: Negative for hot flashes.       Cold intolerance:  none Heat intolerance:  none  Genitourinary:  Positive for frequency and nocturia. Negative for bladder incontinence,  difficulty urinating, dysuria and hematuria.   Musculoskeletal:  Positive for arthralgias, back pain, gait problem and myalgias. Negative for neck pain and neck stiffness.  Skin:  Negative for itching, rash and wound.  Neurological:  Positive for extremity weakness and gait problem. Negative for dizziness, headaches, light-headedness, numbness and speech difficulty.       Gait problems due to arthralgias in hips  Hematological:  Negative for adenopathy. Bruises/bleeds easily.  Psychiatric/Behavioral:  Positive for depression and sleep disturbance. Negative for suicidal ideas. The patient is not nervous/anxious.        Sleeps poorly due to arthralgias and myalgias    MEDICAL HISTORY: Past Medical History:  Diagnosis Date   Anxiety    Asthma    Barrett's esophagus    Blood transfusion without reported diagnosis    Complication of anesthesia    nasuea and vomiting   Food allergy    Headache    Hyperlipidemia    IBS (irritable bowel syndrome)    Iron deficiency anemia    Urticaria     SURGICAL HISTORY: Past Surgical History:  Procedure Laterality Date   ABDOMINAL SURGERY  12/2015   pylorus plasty   APPENDECTOMY  1984   BIOPSY  05/18/2020   Procedure: BIOPSY;  Surgeon: Ronnette Juniper, MD;  Location: [facility];  Service: Gastroenterology;;   CHOLECYSTECTOMY  2008   ESOPHAGOGASTRODUODENOSCOPY (EGD) WITH PROPOFOL N/A 05/18/2020   Procedure: ESOPHAGOGASTRODUODENOSCOPY (EGD) WITH PROPOFOL;  Surgeon: Ronnette Juniper, MD;  Location: [facility];  Service: Gastroenterology;  Laterality: N/A;   GASTROSTOMY TUBE PLACEMENT     HIATAL HERNIA REPAIR  07/2015   PARTIAL ESOPHAGECTOMY  12/10/2021   TALC PLEURODESIS     TONSILLECTOMY  1980   VENTRAL HERNIA REPAIR  2010    SOCIAL HISTORY: Social History   Socioeconomic History   Marital status: Single    Spouse name: Not on file   Number of children: Not on file   Years of education: Not on file   Highest education level: Not on file   Occupational History   Not on file  Tobacco Use   Smoking status: Never   Smokeless tobacco: Never  Vaping Use   Vaping Use: Never used  Substance and Sexual Activity   Alcohol use: No   Drug use: No   Sexual activity: Not Currently  Other Topics Concern   Not on file  Social History Narrative   Not on file   Social Determinants of Health   Financial Resource Strain: Not on file  Food Insecurity: No Food Insecurity (10/13/2020)   Hunger Vital Sign    Worried About Running Out of Food in the Last Year: Never true    Ran Out of Food in the Last Year: Never true  Transportation Needs: No Transportation Needs (10/13/2020)   PRAPARE - Hydrologist (Medical): No    Lack of Transportation (Non-Medical): No  Physical Activity: Not on file  Stress: Not on file  Social Connections: Not on file  Intimate Partner Violence: Not on file    FAMILY HISTORY Family History  Problem Relation Age of Onset   Parkinson's disease Mother    Angioedema Mother    Heart disease Father    Allergic rhinitis Neg Hx    Asthma Neg Hx    Eczema Neg Hx    Immunodeficiency Neg Hx    Urticaria Neg Hx     ALLERGIES:  is allergic to mushroom extract complex, other, shellfish allergy, reglan [metoclopramide], and tetracyclines & related.  MEDICATIONS:  Current Outpatient Medications  Medication Sig Dispense Refill   acetaminophen (TYLENOL) 500 MG tablet Take 1,000 mg by mouth every 6 (six) hours as needed for moderate pain.     aspirin 81 MG EC tablet Take 81 mg by mouth at bedtime. Swallow whole.     chlorpheniramine-HYDROcodone (TUSSIONEX) 10-8 MG/5ML Take 5 mLs by mouth every 12 (twelve) hours as needed for cough.     Cholecalciferol (VITAMIN D3) 10 MCG (400 UNIT) CAPS Take 400 Units by mouth daily.     Cyanocobalamin (VITAMIN B 12 PO) Take 1,000 mcg by mouth daily.     cyclobenzaprine (FLEXERIL) 10 MG tablet Take 10 mg by mouth 3 (three) times daily as needed for  muscle spasms.     famotidine (PEPCID) 40 MG tablet Take 40 mg by mouth 2 (two) times daily.     Ferrous Sulfate (IRON) 325 (65 Fe) MG TABS Take 325 mg by mouth every other day.      ipratropium (ATROVENT) 0.06 % nasal spray USE TWO SPRAYS IN EACH NOSTRIL TWICE DAILY AS DIRECTED. (Patient taking differently: Place  1 spray into both nostrils daily as needed for rhinitis.) 15 mL 0   Magnesium 250 MG TABS Take 250 mg by mouth daily.     metoCLOPramide (REGLAN) 10 MG tablet Take 10 mg by mouth 2 (two) times daily before a meal.     montelukast (SINGULAIR) 10 MG tablet Take 10 mg by mouth daily as needed (allergies).     Multiple Vitamin (MULTIVITAMIN WITH MINERALS) TABS tablet Take 1 tablet by mouth daily.     pantoprazole (PROTONIX) 40 MG tablet Take 40 mg by mouth 2 (two) times daily.     PROAIR DIGIHALER 108 (90 Base) MCG/ACT AEPB Take 2 puffs by mouth every 4 (four) hours as needed (sob/wheezing).     Probiotic Product (PROBIOTIC ADVANCED) CAPS Take 1 capsule by mouth 2 (two) times daily.     promethazine (PHENERGAN) 25 MG tablet Take 25 mg by mouth every 6 (six) hours as needed for nausea or vomiting.     rosuvastatin (CRESTOR) 20 MG tablet Take 20 mg by mouth at bedtime.     sucralfate (CARAFATE) 1 g tablet Take 1 tablet (1 g total) by mouth 4 (four) times daily -  with meals and at bedtime. 42 tablet 0   traZODone (DESYREL) 50 MG tablet Take 50 mg by mouth at bedtime.     Zinc 50 MG TABS Take 50 mg by mouth daily.      No current facility-administered medications for this visit.   Facility-Administered Medications Ordered in Other Visits  Medication Dose Route Frequency Provider Last Rate Last Admin   sodium chloride flush (NS) 0.9 % injection 10 mL  10 mL Intracatheter PRN Loni Muse, MD   10 mL at 09/15/22 1258    PHYSICAL EXAMINATION:  ECOG PERFORMANCE STATUS: 1 - Symptomatic but completely ambulatory   There were no vitals filed for this visit.  There were no vitals  filed for this visit.   Physical Exam Vitals and nursing note reviewed.  Constitutional:      Appearance: Normal appearance. He is normal weight. He is not diaphoretic.  HENT:     Head: Normocephalic and atraumatic.     Right Ear: External ear normal.     Left Ear: External ear normal.     Nose: Nose normal.  Eyes:     General: No scleral icterus.    Conjunctiva/sclera: Conjunctivae normal.     Pupils: Pupils are equal, round, and reactive to light.  Cardiovascular:     Rate and Rhythm: Normal rate and regular rhythm.     Heart sounds: Normal heart sounds. No murmur heard.    No friction rub. No gallop.  Pulmonary:     Effort: Pulmonary effort is normal. No respiratory distress.     Breath sounds: Normal breath sounds. No stridor.  Abdominal:     General: Bowel sounds are normal. There is no distension.     Palpations: Abdomen is soft. There is no mass.  Musculoskeletal:        General: No swelling, tenderness, deformity or signs of injury. Normal range of motion.     Cervical back: Normal range of motion and neck supple. No rigidity or tenderness.  Lymphadenopathy:     Head:     Right side of head: No submental, submandibular, tonsillar, preauricular, posterior auricular or occipital adenopathy.     Left side of head: No submental, submandibular, tonsillar, preauricular, posterior auricular or occipital adenopathy.     Cervical: No cervical adenopathy.  Right cervical: No superficial, deep or posterior cervical adenopathy.    Left cervical: No superficial, deep or posterior cervical adenopathy.     Upper Body:     Right upper body: No supraclavicular or axillary adenopathy.     Left upper body: No supraclavicular or axillary adenopathy.     Lower Body: No right inguinal adenopathy. No left inguinal adenopathy.  Skin:    Coloration: Skin is not jaundiced or pale.  Neurological:     General: No focal deficit present.     Mental Status: He is alert and oriented to person,  place, and time.  Psychiatric:        Mood and Affect: Mood normal.        Behavior: Behavior normal.        Thought Content: Thought content normal.        Judgment: Judgment normal.      LABORATORY DATA: I have personally reviewed the data as listed:  Clinical Support on 09/15/2022  Component Date Value Ref Range Status   Coagulation Factor VIII 09/15/2022 301 (H)  56 - 140 % Final   Comment: (NOTE) FVIII activity can increase in a variety of clinical situations including normal pregnancy, in samples drawn from patients (particularly children) who are visibly stressed at the time of phlebotomy, as acute phase reactants, or in response to certain drug therapies such as DDAVP.  Persistently elevated FVIII activity is a risk factor for venous thrombosis as well as recurrence of venous thrombosis.  Risk is graded and increases with the degree of elevation.  Although elevated FVIII activity has been identified to cluster within families, a genetic basis for the elevation has not yet been elucidated (Br J Haematol. 2012; 802:233-612).    Ristocetin Co-factor, Plasma 09/15/2022 338 (H)  50 - 200 % Final   Comment: (NOTE) Performed At: [facility] [place] [address] [place], [place] [id number] Jolene Schimke MD [id number]    Von Willebrand Antigen, Plasma 09/15/2022 478 (H)  50 - 200 % Final   Comment: (NOTE) VWF may elevate in normal pregnancy, in samples drawn from patients (particularly children) who are visibly stressed at the time of phlebotomy, as acute phase reactants, or in response to certain drug therapies such as DDAVP.  These and other situations may increase levels compared to baseline values.  For these reasons, it may be necessary to repeat testing to make a diagnosis of VWD. This test was developed and its performance characteristics determined by [facility]. It has not been cleared or approved by the Food and Drug Administration.    Fibrinogen 09/15/2022  557 (H)  210 - 475 mg/dL Final   Comment: (NOTE) Fibrinogen results may be underestimated in patients receiving thrombolytic therapy. Performed at [facility], [address] [place], [place] [address]    Prothrombin Time 09/15/2022 11.5  11.4 - 15.2 seconds Final   INR 09/15/2022 0.9  0.8 - 1.2 Final   Comment: (NOTE) INR goal varies based on device and disease states. Performed at [facility], [address], [place], [place] [address]    aPTT 09/15/2022 24  24 - 36 seconds Final   Performed at [facility], [address], [place], [place] [address]   Vit D, 25-Hydroxy 09/15/2022 54.67  30 - 100 ng/mL Final   Comment: (NOTE) Vitamin D deficiency has been defined by the Institute of Medicine  and an Endocrine Society practice guideline as a level of serum 25-OH  vitamin  D less than 20 ng/mL (1,2). The Endocrine Society went on to  further define vitamin D insufficiency as a level between 21 and 29  ng/mL (2).  1. IOM (Institute of Medicine). 2010. Dietary reference intakes for  calcium and D. [place]: The [employer]. 2. Holick MF, Binkley [place], Bischoff-Ferrari HA, et al. Evaluation,  treatment, and prevention of vitamin D deficiency: an Endocrine  Society clinical practice guideline, JCEM. 2011 Jul; 96(7): 1911-30.  Performed at [facility], [place] [address] [place], [place] [address]    Vitamin B1 (Thiamine) 09/15/2022 96.6  66.5 - 200.0 nmol/L Final   Comment: (NOTE) This test was developed and its performance characteristics determined by [facility]. It has not been cleared or approved by the Food and Drug Administration. Performed At: [facility] [place], [place] [id number] Rush Farmer MD [id number]    Sodium 09/15/2022 137  135 - 145 mmol/L Final   Potassium 09/15/2022 3.5  3.5 - 5.1 mmol/L Final   Chloride 09/15/2022 102  98 - 111 mmol/L Final   CO2  09/15/2022 26  22 - 32 mmol/L Final   Glucose, Bld 09/15/2022 79  70 - 99 mg/dL Final   Glucose reference range applies only to samples taken after fasting for at least 8 hours.   BUN 09/15/2022 12  6 - 20 mg/dL Final   Creatinine 09/15/2022 0.67  0.61 - 1.24 mg/dL Final   Calcium 09/15/2022 9.1  8.9 - 10.3 mg/dL Final   Total Protein 09/15/2022 7.1  6.5 - 8.1 g/dL Final   Albumin 09/15/2022 3.7  3.5 - 5.0 g/dL Final   AST 09/15/2022 26  15 - 41 U/L Final   ALT 09/15/2022 33  0 - 44 U/L Final   Alkaline Phosphatase 09/15/2022 69  38 - 126 U/L Final   Total Bilirubin 09/15/2022 1.0  0.3 - 1.2 mg/dL Final   GFR, Estimated 09/15/2022 >60  >60 mL/min Final   Comment: (NOTE) Calculated using the CKD-EPI Creatinine Equation (2021)    Anion gap 09/15/2022 9  5 - 15 Final   Performed at [facility], [place] [address] [place], [place] [address]   Vitamin B-12 09/15/2022 4,981 (H)  180 - 914 pg/mL Final   Comment: RESULT CONFIRMED BY MANUAL DILUTION (NOTE) This assay is not validated for testing neonatal or myeloproliferative syndrome specimens for Vitamin B12 levels. Performed at [facility], [place] [address] [place], [place] [address]    Folate 09/15/2022 30.0  >5.9 ng/mL Final   Comment: RESULT CONFIRMED BY MANUAL DILUTION Performed at [place] [address], [place], [place] [address]    Ferritin 09/15/2022 117  24 - 336 ng/mL Final   Performed at [place] [address] [place], [place] [address]   Vitamin B6 09/15/2022 19.3  3.4 - 65.2 ug/L Final   Comment: (NOTE) This test was developed and its performance characteristics determined by [facility]. It has not been cleared or approved by the Food and Drug Administration.                             Deficiency:         <3.4                             Marginal:      3.4 - 5.1  Adequate:           >5.1 Performed At: [facility] [place], [place] [id number] Rush Farmer MD [id number]    Phosphorus 09/15/2022 2.5  2.5 - 4.6 mg/dL Final   Performed at [facility], [place] [address], [place], [place] [address]   Interpretation 09/15/2022 Note   Final   Comment: (NOTE) ------------------------------- COAGULATION: VON WILLEBRAND FACTOR ASSESSMENT CURRENT RESULTS ASSESSMENT The VWF:Ag is elevated. The VWF:RCo is elevated. The FVIII is elevated. VON WILLEBRAND FACTOR ASSESSMENT CURRENT RESULTS INTERPRETATION - These results are not consistent with a diagnosis of VWD according to the current NHLBI guideline. Persistently elevated FVIII activity is a risk factor for venous thrombosis as well as recurrence of venous thrombosis. Risk is graded and increases with the degree of elevation. Although elevated FVIII activity has been identified to cluster within families, a genetic basis for the elevation has not yet been elucidated (Br J Haematol. 2012; 157(6):653-663). VON WILLEBRAND FACTOR ASSESSMENT - VWF and FVIII may elevate as compared to baseline in samples drawn from patients (particularly children) who are visibly stressed at the time of phlebotomy, as acute phase reactants, or in response to certain drug therapies such as                           desmopressin. Repeat testing may be necessary before excluding a diagnosis of VWD especially if the clinical suspicion is high for an underlying bleeding disorder. The setting for phlebotomy should be as calm as possible and patients should be encouraged to sit quietly prior to the blood draw. VON WILLEBRAND FACTOR ASSESSMENT DEFINITIONS - VWD - von Willebrand disease; VWF - von Willebrand factor; VWF:Ag - VWF antigen; VWF:RCo - VWF ristocetin cofactor activity; FVIII - factor VIII activity. MEDICAL DIRECTOR: For questions regarding panel interpretation, please contact Jake Bathe, M.D. at  [facility] at [phone]. ------------------------------- DISCLAIMER These assessments and interpretations are provided as a convenience in support of the physician-patient relationship and are not intended to replace the physician's clinical judgment. They are derived from national guidelines in addition to other evidence and expert opinion. The clinici                          an should consider this information within the context of clinical opinion and the individual patient. SEE GUIDANCE FOR VON WILLEBRAND FACTOR ASSESSMENT: (1) The National Heart, Lung and Blood Institute. The Diagnosis, Evaluation and Management of von Willebrand Disease. Janeal Holmes, MD: [place] Publication A999333. [id number]. Available at vSpecials.com.pt. (2) Daryl Eastern et al. [place] J Hematol. 2009; 84(6):366-370. (3) [place]. 2004;10(3):199-217. (4) Pasi KJ et al. Haemophilia. 2004; 10(3):218-231. Performed At: [facility] [address] [place], [place] [id number] Eino Farber MD [id number]   Office Visit on 09/15/2022  Component Date Value Ref Range Status   WBC 09/15/2022 8.8  4.0 - 10.5 K/uL Final   RBC 09/15/2022 4.62  4.22 - 5.81 MIL/uL Final   Hemoglobin 09/15/2022 15.4  13.0 - 17.0 g/dL Final   HCT 09/15/2022 47.4  39.0 - 52.0 % Final   MCV 09/15/2022 102.6 (H)  80.0 - 100.0 fL Final   MCH 09/15/2022 33.3  26.0 - 34.0 pg Final   MCHC 09/15/2022 32.5  30.0 - 36.0 g/dL Final   RDW 09/15/2022 13.2  11.5 - 15.5 % Final   Platelets 09/15/2022 222  150 - 400 K/uL Final   nRBC 09/15/2022 0.0  0.0 - 0.2 % Final   Neutrophils Relative % 09/15/2022 72  % Final   Neutro Abs 09/15/2022 6.3  1.7 - 7.7 K/uL Final   Lymphocytes Relative 09/15/2022 17  % Final   Lymphs Abs 09/15/2022 1.5  0.7 - 4.0 K/uL Final   Monocytes Relative 09/15/2022 7  % Final   Monocytes Absolute 09/15/2022 0.7  0.1 - 1.0 K/uL Final    Eosinophils Relative 09/15/2022 1  % Final   Eosinophils Absolute 09/15/2022 0.0  0.0 - 0.5 K/uL Final   Basophils Relative 09/15/2022 1  % Final   Basophils Absolute 09/15/2022 0.1  0.0 - 0.1 K/uL Final   Immature Granulocytes 09/15/2022 2  % Final   Abs Immature Granulocytes 09/15/2022 0.17 (H)  0.00 - 0.07 K/uL Final   Performed at [facility], [place] [address] [place], [place] [address]   Vitamin E (Alpha Tocopherol) 09/15/2022 12.4  7.0 - 25.1 mg/L Final   Comment: (NOTE) This test was developed and its performance characteristics determined by [facility]. It has not been cleared or approved by the Food and Drug Administration.    Vitamin E(Gamma Tocopherol) 09/15/2022 0.6  0.5 - 5.5 mg/L Corrected   Comment: (NOTE) This test was developed and its performance characteristics determined by [facility]. It has not been cleared or approved by the Food and Drug Administration. Reference intervals for alpha and gamma-tocopherol determined from [facility] [facility] and Nutrition Examination Survey, 2005-2006. Individuals with alpha-tocopherol levels less than 5.0 mg/L are considered vitamin E deficient. Performed At: [facility] [place], [place] [id number] Rush Farmer MD [id number]    Vitamin A (Retinoic Acid) 09/15/2022 49.6  20.1 - 62.0 ug/dL Final   Comment: (NOTE) Reference intervals for vitamin A determined from [facility] internal studies. Individuals with vitamin A less than 20 ug/dL are considered vitamin A deficient and those with serum concentrations less than 10 ug/dL are considered severely deficient. This test was developed and its performance characteristics determined by [facility]. It has not been cleared or approved by the Food and Drug Administration. Performed At: [facility] [address] [place], [place] [id number] Rush Farmer MD [id number]     RADIOGRAPHIC STUDIES: I have personally reviewed the radiological  images as listed and agree with the findings in the report  No results found.  ASSESSMENT/PLAN 56 year old male with CAD who has a complicated history of upper GI surgeries which culminated in a gastrectomy performed in January [id number] complicated by a prolonged postoperative course.    Iron deficiency:  Secondary poor iron absorption but may have ongoing GI blood loss.    Increased bruising:  This is a new event.   Patient's bleeding history has been benign especially when considering the number of surgical procedures he has undergone. Coagulation testing including CBC with diff, PT/PTT/ von Willebrand profile was normal.  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  Labs from September 15 2022 indicated that absorption was reasonably intact.     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  Vitamin D level, Calcium, Phosphate adequate.  Refer for DEXA scan.  Consider Rheumatologic evaluation in the future  Fatigue:  Likely multifactorial.  Still recovering from complications of sepsis earlier this year.  Appears to be suffering from depression with disturbances in sleep and appetite.  Might benefit from more intense pharmacologic intervention.      Cancer Staging  No matching staging information was found for the patient.   No problem-specific Assessment & Plan notes found for this encounter.   No orders of the defined types were placed in this encounter.   All questions were answered. The patient knows to call the clinic with any problems, questions or concerns.  This note was electronically signed.    Barbee Cough, MD  09/22/2022 1:08 PM

## 2022-09-23 ENCOUNTER — Ambulatory Visit: Payer: Medicare (Managed Care) | Admitting: Dietician

## 2022-09-23 NOTE — Progress Notes (Signed)
Nutrition Assessment: Reached out to patient at home/mobile telephone number.  Connect was poor and he was breaking up during conversation.  Reason for Assessment: MST screen/follow up IP RD malnutrition   ASSESSMENT:  Patient is 56 year old male who reported for consult with [name] for bruising. He has PMHx that includes CAD, GERD, Barrett Esophagus, s/p, Ivor Lewis Esophagectomy  recurrent hiatal hernia, IBS, HLD, and migraines.  Patient reports he's only eating 2 meal per day currently but has been encouraged to eat 4-5 smaller meals.  He typically doesn't eat breakfast due to migraines. NIS nausea, bloating and abdominal pain.  Main barrier to eating is fear and anxiety of feeling poorly.  He like Boost breeze ONS but commented on expense.  He has not yet tried Ensure clear.    Usual Intake: Frozen meals (spaghetti with pasta, sandwiches with cheese) like yogurts, likes peanut butter toast.  Doesn't like white milk, will drink chocolate milk.  Food allergies: mushroom extract, shellfish   Nutrition Focused Physical Exam: unable to perform NFPE.  IP RD assessed 07/15/22.   Medications: using carafate daily, takes MVI, and oral iron,    Labs: 09/15/22 reviewed   Anthropometrics: weight stable since malnutrition assessment and NFPE  Height: 66"  Weight:  09/22/22  126.4# 07/13/22  126# 02/09/22  128# UBW: Pt states he has lost about 30 lbs since January  BMI: 20.55  Estimated Nutritional Needs:    Kcal:  1700-1900   Protein:  85-100g   Fluid:  >/=1.7L  MALNUTRITION DIAGNOSIS: Moderate Malnutrition related to chronic illness (Barrett's esophagus s/p esophagectomy) as evidenced by mild fat depletion, moderate fat depletion, severe muscle depletion, moderate muscle depletion diagnosed by IP RD.      INTERVENTION:   Educated on importance of adequate calorie and protein energy intake  with nutrient dense foods when possible to maintain weight/strength  Encouraged soft  moist high protein high calorie snack between meals as well as small frequent meals/snacks -   Suggested oral nutrition supplement, offer to have Ensure clear samples available for pick up. Will [employer] coupons.  Emailed Nutrition Tip sheet  for  High Calorie/High Protein snacking Contact information provided  MONITORING, EVALUATION, GOAL: weight trends, nutrition impact symptoms, PO intake, labs  Goal weight gain 5# by end of year Next Visit: remote next month  Edwin Hall, RDN, LDN Registered Dietitian, [facility] Part Time Remote (Usual office hours: Tuesday-Thursday) Mobile: [phone] Remote Office: [phone]

## 2022-09-24 LAB — VITAMIN K1, SERUM: VITAMIN K1: 0.1 ng/mL (ref 0.10–2.20)

## 2022-09-26 LAB — COPPER, SERUM: Copper: 89 ug/dL (ref 69–132)

## 2022-09-26 LAB — ZINC: Zinc: 93 ug/dL (ref 44–115)

## 2022-10-21 ENCOUNTER — Ambulatory Visit: Payer: Medicare (Managed Care) | Admitting: Dietician

## 2022-10-21 NOTE — Progress Notes (Signed)
Nutrition Follow UP: Reached out to patient at home/mobile telephone number.     ASSESSMENT:   Patient reports he hasn't made many change to PO intake. However during recall he did say now he's trying to eat an egg sandwich in the morning.  He is still having some concerns with nausea when he tries to eat larger meals. He no longer feels his intake is related to anxiety of fear with eating now just doesn't have any energy and is too fatigued to have much of an appetite.  He continues to use [employer] and reports drinking a couple a day.   He says he can't tell with his scale how his weight is running because it isn't digital.  He feels like he may still be losing weight.  Medications: reviewed     Labs: no new labs   Anthropometrics: no new information   Height: 66"   Weight:  09/22/22  126.4# 07/13/22  126# 02/09/22  128# UBW: Pt states he has lost about 30 lbs since January  BMI: 20.55   Estimated Nutritional Needs:    Kcal:  1700-1900   Protein:  85-100 g   Fluid:  >/=1.7L   MALNUTRITION DIAGNOSIS: Moderate Malnutrition related to chronic illness (Barrett's esophagus s/p esophagectomy) as evidenced by mild fat depletion, moderate fat depletion, severe muscle depletion, moderate muscle depletion diagnosed by IP RD.  No new information but patient not reporting improvement.      INTERVENTION:    Educated on importance of adequate calories to improved energy.    Tried to have patient relay previous desired foods that he could do between meals. He used to drink strawberry smoothies and willing to trial again.    Continue with Boost Breeze as able to afford.   Emailed Nutrition Tip sheet for  Dairy free smoothie recipes with contact information provided   MONITORING, EVALUATION, GOAL: weight trends, nutrition impact symptoms, PO intake, labs   Goal weight gain 5# by end of year  Next Visit: Remote after MD visit 2 months   Gennaro Africa, RDN, LDN Registered Dietitian,  [facility] Part Time Remote (Usual office hours: Tuesday-Thursday) Mobile: [phone] Remote Office: [phone]

## 2022-10-22 ENCOUNTER — Encounter: Payer: Self-pay | Admitting: Oncology

## 2022-11-12 ENCOUNTER — Other Ambulatory Visit: Payer: Self-pay

## 2022-11-12 ENCOUNTER — Emergency Department (HOSPITAL_COMMUNITY)
Admission: EM | Admit: 2022-11-12 | Discharge: 2022-11-13 | Disposition: A | Discharge status: Home / Self Care | Payer: Medicare (Managed Care) | Attending: Emergency Medicine | Admitting: Emergency Medicine

## 2022-11-12 ENCOUNTER — Emergency Department (HOSPITAL_COMMUNITY): Payer: Medicare (Managed Care)

## 2022-11-12 DIAGNOSIS — J9811 Atelectasis: Secondary | ICD-10-CM | POA: Insufficient documentation

## 2022-11-12 DIAGNOSIS — Z7982 Long term (current) use of aspirin: Secondary | ICD-10-CM | POA: Insufficient documentation

## 2022-11-12 DIAGNOSIS — Z9049 Acquired absence of other specified parts of digestive tract: Secondary | ICD-10-CM | POA: Insufficient documentation

## 2022-11-12 DIAGNOSIS — I700 Atherosclerosis of aorta: Secondary | ICD-10-CM | POA: Diagnosis not present

## 2022-11-12 DIAGNOSIS — R1084 Generalized abdominal pain: Secondary | ICD-10-CM | POA: Insufficient documentation

## 2022-11-12 DIAGNOSIS — R1110 Vomiting, unspecified: Secondary | ICD-10-CM | POA: Diagnosis present

## 2022-11-12 DIAGNOSIS — R197 Diarrhea, unspecified: Secondary | ICD-10-CM | POA: Insufficient documentation

## 2022-11-12 DIAGNOSIS — J45909 Unspecified asthma, uncomplicated: Secondary | ICD-10-CM | POA: Insufficient documentation

## 2022-11-12 DIAGNOSIS — Z1152 Encounter for screening for COVID-19: Secondary | ICD-10-CM | POA: Insufficient documentation

## 2022-11-12 DIAGNOSIS — K449 Diaphragmatic hernia without obstruction or gangrene: Secondary | ICD-10-CM | POA: Insufficient documentation

## 2022-11-12 DIAGNOSIS — R112 Nausea with vomiting, unspecified: Secondary | ICD-10-CM | POA: Diagnosis not present

## 2022-11-12 LAB — CBC
HCT: 46.5 % (ref 39.0–52.0)
Hemoglobin: 16.1 g/dL (ref 13.0–17.0)
MCH: 31.9 pg (ref 26.0–34.0)
MCHC: 34.6 g/dL (ref 30.0–36.0)
MCV: 92.3 fL (ref 80.0–100.0)
Platelets: 202 10*3/uL (ref 150–400)
RBC: 5.04 MIL/uL (ref 4.22–5.81)
RDW: 11.9 % (ref 11.5–15.5)
WBC: 6.4 10*3/uL (ref 4.0–10.5)
nRBC: 0 % (ref 0.0–0.2)

## 2022-11-12 LAB — RESP PANEL BY RT-PCR (RSV, FLU A&B, COVID)  RVPGX2
Influenza A by PCR: NEGATIVE
Influenza B by PCR: NEGATIVE
Resp Syncytial Virus by PCR: NEGATIVE
SARS Coronavirus 2 by RT PCR: NEGATIVE

## 2022-11-12 MED ORDER — LACTATED RINGERS IV BOLUS
1000.0000 mL | Freq: Once | INTRAVENOUS | Status: AC
  Administered 2022-11-12: 1000 mL via INTRAVENOUS

## 2022-11-12 MED ORDER — HYDROMORPHONE HCL 1 MG/ML IJ SOLN
0.5000 mg | Freq: Once | INTRAMUSCULAR | Status: AC
  Administered 2022-11-12: 0.5 mg via INTRAVENOUS
  Filled 2022-11-12: qty 1

## 2022-11-12 MED ORDER — ONDANSETRON HCL 4 MG/2ML IJ SOLN
4.0000 mg | Freq: Once | INTRAMUSCULAR | Status: DC
  Filled 2022-11-12: qty 2

## 2022-11-12 MED ORDER — SODIUM CHLORIDE 0.9 % IV SOLN
6.2500 mg | Freq: Four times a day (QID) | INTRAVENOUS | Status: DC | PRN
  Administered 2022-11-13: 6.25 mg via INTRAVENOUS
  Filled 2022-11-12: qty 0.25

## 2022-11-12 NOTE — ED Notes (Signed)
Pt needs labs and port accessed for CT

## 2022-11-12 NOTE — ED Notes (Signed)
Pt requesting labs to be drawn with port access.

## 2022-11-12 NOTE — ED Triage Notes (Signed)
Patient reports n/v/d x 3 days Abd pain radiating up into chest Pain rated 8/10 in triage Denies sick contacts

## 2022-11-12 NOTE — ED Notes (Signed)
Portable xray preformed at this time

## 2022-11-12 NOTE — ED Notes (Signed)
Pt attempted to urinate but was unsuccessful. Pt states he has been dehydrated from vomiting.

## 2022-11-12 NOTE — ED Notes (Signed)
Pt needs labs and port accessed for CT scan please

## 2022-11-12 NOTE — ED Provider Triage Note (Signed)
Emergency Medicine Provider Triage Evaluation Note  LAMAJ METOYER , a 56 y.o. male  was evaluated in triage.  Pt complains of abdominal pain for the past 3 days which began with nausea and vomiting and now includes some mild diarrhea.  Patient states the symptoms are similar to the previous bowel obstruction he had.  He has history of multiple prior abdominal surgeries.  The pain is currently epigastric in nature radiating into the chest region.  He denies shortness of breath  Review of Systems  Positive: As abpve Negative: As above  Physical Exam  BP (!) 115/94   Pulse (!) 129   Temp 98.6 F (37 C) (Oral)   Resp 16   Ht 5\' 8"  (1.727 m)   Wt 57.2 kg   SpO2 99%   BMI 19.16 kg/m  Gen:   Awake, no distress   Resp:  Normal effort  MSK:   Moves extremities without difficulty  Other:    Medical Decision Making  Medically screening exam initiated at 3:29 PM.  Appropriate orders placed.  was informed that the remainder of the evaluation will be completed by another provider, this initial triage assessment does not replace that evaluation, and the importance of remaining in the ED until their evaluation is complete.     [name], PA-C 11/12/22 1535

## 2022-11-12 NOTE — ED Provider Notes (Signed)
[facility]-EMERGENCY DEPT Provider Note   CSN: [id number] Arrival date & time: 11/12/22  1434     History  Chief Complaint  Patient presents with   Emesis    Edwin Hall is a 56 y.o. male.  HPI 56 year old male with a history of IBS, hyperlipidemia, asthma, headache, malabsorption syndrome, protein calorie malnutrition, pneumothorax, partial esophagectomy in January of this year secondary to Barrett's esophagus presents to the ER with complaints of epigastric pain radiating into his chest, and generalized abdominal pain x 3 days.  Also endorses nausea, vomiting and diarrhea.  No hematemesis or hematochezia.  No recent antibiotic use.  Denies any sick contacts.  No known fevers or chills.  He presented with similar presentation back in September of this year and required admission    Home Medications Prior to Admission medications   Medication Sig Start Date End Date Taking? Authorizing Provider  acetaminophen (TYLENOL) 500 MG tablet Take 1,000 mg by mouth every 6 (six) hours as needed for moderate pain.    [provider]  aspirin 81 MG EC tablet Take 81 mg by mouth at bedtime. Swallow whole.    [provider]  chlorpheniramine-HYDROcodone (TUSSIONEX) 10-8 MG/5ML Take 5 mLs by mouth every 12 (twelve) hours as needed for cough. Patient not taking: Reported on 09/22/2022    [provider]  Cholecalciferol (VITAMIN D3) 10 MCG (400 UNIT) CAPS Take 400 Units by mouth daily.    [provider]  Cyanocobalamin (VITAMIN B 12 PO) Take 1,000 mcg by mouth daily.    [provider]  cyclobenzaprine (FLEXERIL) 10 MG tablet Take 10 mg by mouth 3 (three) times daily as needed for muscle spasms. 07/05/22   [provider]  famotidine (PEPCID) 40 MG tablet Take 40 mg by mouth 2 (two) times daily. 06/21/22   [provider]  Ferrous Sulfate (IRON) 325 (65 Fe) MG TABS Take 325 mg by mouth every other day.  05/12/20    [provider]  ipratropium (ATROVENT) 0.06 % nasal spray USE TWO SPRAYS IN EACH NOSTRIL TWICE DAILY AS DIRECTED. Patient taking differently: Place 1 spray into both nostrils daily as needed for rhinitis. 05/22/20   Marcelyn Bruins, MD  Magnesium 250 MG TABS Take 250 mg by mouth daily. Patient not taking: Reported on 09/22/2022    [provider]  metoCLOPramide (REGLAN) 10 MG tablet Take 10 mg by mouth 2 (two) times daily before a meal. 06/30/22   [provider]  montelukast (SINGULAIR) 10 MG tablet Take 10 mg by mouth daily as needed (allergies). 12/11/21   [provider]  Multiple Vitamin (MULTIVITAMIN WITH MINERALS) TABS tablet Take 1 tablet by mouth daily.    [provider]  pantoprazole (PROTONIX) 40 MG tablet Take 40 mg by mouth 2 (two) times daily.    [provider]  PROAIR DIGIHALER [phone] Base) MCG/ACT AEPB Take 2 puffs by mouth every 4 (four) hours as needed (sob/wheezing). 09/22/21   [provider]  Probiotic Product (PROBIOTIC ADVANCED) CAPS Take 1 capsule by mouth 2 (two) times daily.    [provider]  promethazine (PHENERGAN) 25 MG tablet Take 25 mg by mouth every 6 (six) hours as needed for nausea or vomiting.    [provider]  rosuvastatin (CRESTOR) 20 MG tablet Take 20 mg by mouth at bedtime.    [provider]  sucralfate (CARAFATE) 1 g tablet Take 1 tablet (1 g total) by mouth 4 (four)  times daily -  with meals and at bedtime. 10/17/20   Tilden Fossa, MD  traZODone (DESYREL) 50 MG tablet Take 50 mg by mouth at bedtime.    [provider]  Zinc 50 MG TABS Take 50 mg by mouth daily.     [provider]  azelastine (ASTELIN) 0.1 % nasal spray 2 sprays per nostril twice a day for control of nasal drainage. Patient not taking: Reported on 05/02/2020 11/27/19 05/02/20  Marcelyn Bruins, MD      Allergies    Mushroom extract complex, Other, Shellfish  allergy, Reglan [metoclopramide], and Tetracyclines & related    Review of Systems   Review of Systems Ten systems reviewed and are negative for acute change, except as noted in the HPI.   Physical Exam Updated Vital Signs BP 108/86   Pulse 80   Temp (!) 97.4 F (36.3 C) (Oral)   Resp (!) 9   Ht 5\' 8"  (1.727 m)   Wt 57.2 kg   SpO2 97%   BMI 19.16 kg/m  Physical Exam Vitals and nursing note reviewed.  Constitutional:      General: He is not in acute distress.    Appearance: He is well-developed.  HENT:     Head: Normocephalic and atraumatic.  Eyes:     Conjunctiva/sclera: Conjunctivae normal.  Cardiovascular:     Rate and Rhythm: Normal rate and regular rhythm.     Heart sounds: No murmur heard. Pulmonary:     Effort: Pulmonary effort is normal. No respiratory distress.     Breath sounds: Normal breath sounds.  Abdominal:     Palpations: Abdomen is soft.     Tenderness: There is abdominal tenderness.     Comments: Mild generalized tenderness, no guarding  Musculoskeletal:        General: No swelling.     Cervical back: Neck supple.  Skin:    General: Skin is warm and dry.     Capillary Refill: Capillary refill takes less than 2 seconds.  Neurological:     Mental Status: He is alert.  Psychiatric:        Mood and Affect: Mood normal.     ED Results / Procedures / Treatments   Labs (all labs ordered are listed, but only abnormal results are displayed) Labs Reviewed  COMPREHENSIVE METABOLIC PANEL - Abnormal; Notable for the following components:      Result Value   Potassium 3.3 (*)    Glucose, Bld 112 (*)    All other components within normal limits  RESP PANEL BY RT-PCR (RSV, FLU A&B, COVID)  RVPGX2  LIPASE, BLOOD  CBC  TROPONIN I (HIGH SENSITIVITY)  TROPONIN I (HIGH SENSITIVITY)    EKG EKG Interpretation  Date/Time:  Friday November 12 2022 22:48:47 EST Ventricular Rate:  105 PR Interval:  158 QRS Duration: 84 QT Interval:  336 QTC  Calculation: 444 R Axis:   -36 Text Interpretation: Sinus tachycardia Right atrial enlargement Left axis deviation Abnormal R-wave progression, early transition Confirmed by 01-15-1986 (Zadie Rhine) on 11/12/2022 11:16:33 PM  Radiology CT CHEST ABDOMEN PELVIS W CONTRAST  Result Date: 11/13/2022 CLINICAL DATA:  abdominal pain, chest pain. Abd pain for three days. Also complaining of nausea/vomiting, mild diarrhea. History of bowel obstructio EXAM: CT CHEST, ABDOMEN, AND PELVIS WITH CONTRAST TECHNIQUE: Multidetector CT imaging of the chest, abdomen and pelvis was performed following the standard protocol during bolus administration of intravenous contrast. RADIATION DOSE REDUCTION: This exam was performed according to the departmental  dose-optimization program which includes automated exposure control, adjustment of the mA and/or kV according to patient size and/or use of iterative reconstruction technique. CONTRAST:  100mL OMNIPAQUE IOHEXOL 300 MG/ML  SOLN COMPARISON:  CT chest 10/03/2020, CT abdomen pelvis 07/14/2022 FINDINGS: CT CHEST FINDINGS Cardiovascular: Right chest wall Port-A-Cath with tip terminating in the right atrium. Normal heart size. No significant pericardial effusion. The thoracic aorta is normal in caliber. No atherosclerotic plaque of the thoracic aorta. Left anterior descending coronary artery calcifications. Mediastinum/Nodes: Elevated left hemidiaphragm. No enlarged mediastinal, hilar, or axillary lymph nodes. Thyroid gland and trachea demonstrate no significant findings. Proximal esophagus is unremarkable. Query partial gastric pull-through distally. Lungs/Pleura: Surgical changes related to a left lower lobe wedge resection. Left upper lobe linear atelectasis versus scarring. Some left upper lobe architectural distortion with associated cystic changes (6:53). No focal consolidation. No pulmonary nodule. No pulmonary mass. No pleural effusion. No pneumothorax. Musculoskeletal: No chest  wall abnormality. No suspicious lytic or blastic osseous lesions. No acute displaced fracture. CT ABDOMEN PELVIS FINDINGS Hepatobiliary: No focal liver abnormality. Status post cholecystectomy no biliary dilatation. Pancreas: No focal lesion. Normal pancreatic contour. No surrounding inflammatory changes. No main pancreatic ductal dilatation. Spleen: Normal in size without focal abnormality. Adrenals/Urinary Tract: No adrenal nodule bilaterally. Bilateral kidneys enhance symmetrically. Subcentimeter hypodensity within the right kidney too small to characterize-no further follow-up indicated. No hydronephrosis. No hydroureter. The urinary bladder is unremarkable. Stomach/Bowel: Stomach is within normal limits. No evidence of bowel wall thickening or dilatation. The appendix is not definitely identified with no inflammatory changes in the right lower quadrant to suggest acute appendicitis. Vascular/Lymphatic: No abdominal aorta or iliac aneurysm. Mild atherosclerotic plaque of the aorta and its branches. No abdominal, pelvic, or inguinal lymphadenopathy. Reproductive: Prostate is unremarkable. Other: Similar-appearing right anterior abdominal fat density lesion likely representing fat necrosis (2:78). No intraperitoneal free fluid. No intraperitoneal free gas. No organized fluid collection. Musculoskeletal: No abdominal wall hernia or abnormality. No suspicious lytic or blastic osseous lesions. No acute displaced fracture. IMPRESSION: 1. No acute intrathoracic, intra-abdominal, intrapelvic abnormality. 2. Query status post partial gastric pull-through surgical changes as well as associated left lower pulmonary lobe wedge resection. 3. Status post cholecystectomy. 4.  Aortic Atherosclerosis (ICD10-I70.0). Electronically Signed   By: Tish FredericksonMorgane  Naveau M.D.   On: 11/13/2022 01:06   DG Chest Portable 1 View  Result Date: 11/12/2022 CLINICAL DATA:  Chest pain. EXAM: PORTABLE CHEST 1 VIEW COMPARISON:  Chest radiograph  dated July 13, 2022 and CT examination dated July 14, 2022 FINDINGS: The heart is normal in size. Chronic elevation of the left hemidiaphragm with left basilar atelectasis and moderate size left hiatal hernia, not significantly changed. Right lung is clear. Right access MediPort with distal tip in the SVC is also unchanged. No acute osseous abnormality. Surgical clips over the GE junction, unchanged. IMPRESSION: Chronic elevation of the left hemidiaphragm with left basilar atelectasis and moderate size left hiatal hernia, not significantly changed. No evidence of acute cardiopulmonary process. Electronically Signed   By: Larose HiresImran  Ahmed D.O.   On: 11/12/2022 22:57    Procedures Procedures    Medications Ordered in ED Medications  ondansetron (ZOFRAN) injection 4 mg (4 mg Intravenous Not Given 11/12/22 2325)  promethazine (PHENERGAN) 6.25 mg in sodium chloride 0.9 % 50 mL IVPB (0 mg Intravenous Stopped 11/13/22 0150)  dicyclomine (BENTYL) capsule 10 mg (has no administration in time range)  lactated ringers bolus 1,000 mL (0 mLs Intravenous Stopped 11/13/22 0150)  HYDROmorphone (DILAUDID) injection 0.5 mg (  0.5 mg Intravenous Given 11/12/22 2356)  iohexol (OMNIPAQUE) 300 MG/ML solution 100 mL (100 mLs Intravenous Contrast Given 11/13/22 0035)  potassium chloride SA (KLOR-CON M) CR tablet 40 mEq (20 mEq Oral Given 11/13/22 0150)  HYDROmorphone (DILAUDID) injection 0.5 mg (0.5 mg Intravenous Given 11/13/22 0156)  lactated ringers bolus 500 mL (0 mLs Intravenous Stopped 11/13/22 0308)  HYDROmorphone (DILAUDID) injection 1 mg (1 mg Intravenous Given 11/13/22 0332)  alum & mag hydroxide-simeth (MAALOX/MYLANTA) 200-200-20 MG/5ML suspension 30 mL (30 mLs Oral Given 11/13/22 1610)    ED Course/ Medical Decision Making/ A&P                           Medical Decision Making Amount and/or Complexity of Data Reviewed Labs: ordered. Radiology: ordered.  Risk OTC drugs. Prescription drug  management.  56 year old male presented to the ER with complaints of abdominal pain, nausea, vomiting, diarrhea.  Vitals overall reassuring on arrival.  Differential diagnosis includes viral gastroenteritis, appendicitis, diverticulitis, small bowel obstruction, C. difficile colitis, cholecystitis  Labs ordered, reviewed, CBC unremarkable, CMP with mild hypokalemia 3.3, lipase normal, delta troponin negative.  COVID and flu are negative.  EKG reviewed, nonischemic.  Patient had complained of epigastric pain rating up into the chest and the back.  With this, I ordered a chest x-ray, reviewed, agree with radiology read  Reviewed external records, patient required admission for similar presentation in September of this year, discharged with Dilaudid  IMPRESSION:  Chronic elevation of the left hemidiaphragm with left basilar  atelectasis and moderate size left hiatal hernia, not significantly  changed.   Setting-chest pain radiating to the back, added onto chest CT to the abdomen and pelvis.  I reviewed this, agree with radiology read  IMPRESSION:  1. No acute intrathoracic, intra-abdominal, intrapelvic abnormality.  2. Query status post partial gastric pull-through surgical changes  as well as associated left lower pulmonary lobe wedge resection.  3. Status post cholecystectomy.  4.  Aortic Atherosclerosis (ICD10-I70.0).      Patient received 1.5 L LR bolus, Maalox, multiple rounds of Dilaudid, potassium repletion, Phenergan for nausea (patient reports he cannot take Reglan or Zofran).  On reevaluation, pain has improved. Tolerated PO orange juice though did have some increased pain with it. He received additional Dilaudid with improvement. Recommended followup w/ Dr. Loreta Ave w/ GI and return precautions. He is agreeable to this   Stable for discharge  This was a shared visit with my supervising physician Dr. Bebe Shaggy who independently saw and evaluated the patient & provided guidance in  evaluation/management/disposition ,in agreement with care   Final Clinical Impression(s) / ED Diagnoses Final diagnoses:  Generalized abdominal pain    Rx / DC Orders ED Discharge Orders     None         Leone Brand 11/13/22 0421    Zadie Rhine, MD 11/13/22 [phone]

## 2022-11-13 ENCOUNTER — Emergency Department (HOSPITAL_COMMUNITY): Payer: Medicare (Managed Care)

## 2022-11-13 ENCOUNTER — Encounter (HOSPITAL_COMMUNITY): Payer: Self-pay

## 2022-11-13 LAB — COMPREHENSIVE METABOLIC PANEL
ALT: 24 U/L (ref 0–44)
AST: 20 U/L (ref 15–41)
Albumin: 4 g/dL (ref 3.5–5.0)
Alkaline Phosphatase: 68 U/L (ref 38–126)
Anion gap: 11 (ref 5–15)
BUN: 18 mg/dL (ref 6–20)
CO2: 23 mmol/L (ref 22–32)
Calcium: 8.9 mg/dL (ref 8.9–10.3)
Chloride: 105 mmol/L (ref 98–111)
Creatinine, Ser: 0.97 mg/dL (ref 0.61–1.24)
GFR, Estimated: >60 mL/min (ref >60)
Glucose, Bld: 112 mg/dL — ABNORMAL HIGH (ref 70–99)
Potassium: 3.3 mmol/L — ABNORMAL LOW (ref 3.5–5.1)
Sodium: 139 mmol/L (ref 135–145)
Total Bilirubin: 1.2 mg/dL (ref 0.3–1.2)
Total Protein: 6.8 g/dL (ref 6.5–8.1)

## 2022-11-13 LAB — TROPONIN I (HIGH SENSITIVITY)
Troponin I (High Sensitivity): 14 ng/L (ref <18)
Troponin I (High Sensitivity): 17 ng/L (ref <18)

## 2022-11-13 LAB — LIPASE, BLOOD: Lipase: 26 U/L (ref 11–51)

## 2022-11-13 MED ORDER — POTASSIUM CHLORIDE CRYS ER 20 MEQ PO TBCR
40.0000 meq | EXTENDED_RELEASE_TABLET | Freq: Once | ORAL | Status: AC
  Administered 2022-11-13: 20 meq via ORAL
  Filled 2022-11-13: qty 2

## 2022-11-13 MED ORDER — IOHEXOL 300 MG/ML  SOLN
100.0000 mL | Freq: Once | INTRAMUSCULAR | Status: AC | PRN
  Administered 2022-11-13: 100 mL via INTRAVENOUS

## 2022-11-13 MED ORDER — ALUM & MAG HYDROXIDE-SIMETH 200-200-20 MG/5ML PO SUSP
30.0000 mL | Freq: Once | ORAL | Status: AC
  Administered 2022-11-13: 30 mL via ORAL
  Filled 2022-11-13: qty 30

## 2022-11-13 MED ORDER — DICYCLOMINE HCL 10 MG PO CAPS
10.0000 mg | ORAL_CAPSULE | Freq: Once | ORAL | Status: AC
  Administered 2022-11-13: 10 mg via ORAL
  Filled 2022-11-13: qty 1

## 2022-11-13 MED ORDER — HALOPERIDOL LACTATE 5 MG/ML IJ SOLN: 1.0000 mg | Freq: Once | INTRAMUSCULAR | Status: DC

## 2022-11-13 MED ORDER — HYDROMORPHONE HCL 1 MG/ML IJ SOLN: 1.0000 mg | Freq: Once | INTRAMUSCULAR | Status: DC

## 2022-11-13 MED ORDER — HYDROMORPHONE HCL 1 MG/ML IJ SOLN
0.5000 mg | Freq: Once | INTRAMUSCULAR | Status: AC
  Administered 2022-11-13: 0.5 mg via INTRAVENOUS
  Filled 2022-11-13: qty 1

## 2022-11-13 MED ORDER — HYDROMORPHONE HCL 1 MG/ML IJ SOLN
1.0000 mg | Freq: Once | INTRAMUSCULAR | Status: AC
  Administered 2022-11-13: 1 mg via INTRAVENOUS
  Filled 2022-11-13: qty 1

## 2022-11-13 MED ORDER — HEPARIN SOD (PORK) LOCK FLUSH 100 UNIT/ML IV SOLN
500.0000 [IU] | Freq: Once | INTRAVENOUS | Status: AC
  Administered 2022-11-13: 500 [IU]
  Filled 2022-11-13: qty 5

## 2022-11-13 MED ORDER — LACTATED RINGERS IV BOLUS
500.0000 mL | Freq: Once | INTRAVENOUS | Status: AC
  Administered 2022-11-13: 500 mL via INTRAVENOUS

## 2022-11-13 NOTE — ED Notes (Signed)
Patient's port flushed with 10 mL NS and 5 mL Heparin flush prior to de-access

## 2022-11-13 NOTE — ED Notes (Signed)
Patient attempted juice for PO challenge.  Reports increased "sharp" chest pain after oral intake

## 2022-11-13 NOTE — Discharge Instructions (Addendum)
You were evaluated in the Emergency Department and after careful evaluation, we did not find any emergent condition requiring admission or further testing in the hospital.  Please follow-up with Dr. Loreta Ave for your symptoms.  Please return to the Emergency Department if you experience any worsening of your condition.  We encourage you to follow up with a primary care provider.  Thank you for allowing [place] to be a part of your care.

## 2022-11-22 ENCOUNTER — Encounter: Payer: Self-pay | Admitting: Genetic Counselor

## 2022-11-22 ENCOUNTER — Telehealth: Payer: Self-pay | Admitting: Genetic Counselor

## 2022-11-22 NOTE — Telephone Encounter (Signed)
Spoke with patient and explained that our clinic tests for inherited predisposition for cancer.  Reviewing his chart I did not see a family or personal history that was consistent with a hereditary predisposition for cancer.  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  Explained that we messaged Dr. Federico Flake and that we would refer him back to him for a discussion about this.   Patient voiced understanding.

## 2022-11-24 ENCOUNTER — Other Ambulatory Visit: Payer: Medicare (Managed Care)

## 2022-11-24 ENCOUNTER — Encounter: Payer: Medicare (Managed Care) | Admitting: Genetic Counselor

## 2022-11-26 ENCOUNTER — Emergency Department (HOSPITAL_COMMUNITY): Payer: Medicare (Managed Care)

## 2022-11-26 ENCOUNTER — Emergency Department (HOSPITAL_COMMUNITY)
Admission: EM | Admit: 2022-11-26 | Discharge: 2022-11-27 | Disposition: A | Discharge status: Home / Self Care | Payer: Medicare (Managed Care) | Attending: Emergency Medicine | Admitting: Emergency Medicine

## 2022-11-26 ENCOUNTER — Encounter (HOSPITAL_COMMUNITY): Payer: Self-pay | Admitting: Emergency Medicine

## 2022-11-26 DIAGNOSIS — Z7982 Long term (current) use of aspirin: Secondary | ICD-10-CM | POA: Insufficient documentation

## 2022-11-26 DIAGNOSIS — J45909 Unspecified asthma, uncomplicated: Secondary | ICD-10-CM | POA: Insufficient documentation

## 2022-11-26 DIAGNOSIS — R112 Nausea with vomiting, unspecified: Secondary | ICD-10-CM | POA: Insufficient documentation

## 2022-11-26 DIAGNOSIS — E860 Dehydration: Secondary | ICD-10-CM | POA: Diagnosis not present

## 2022-11-26 DIAGNOSIS — R1012 Left upper quadrant pain: Secondary | ICD-10-CM | POA: Diagnosis present

## 2022-11-26 DIAGNOSIS — R197 Diarrhea, unspecified: Secondary | ICD-10-CM | POA: Insufficient documentation

## 2022-11-26 LAB — CBC
HCT: 50.5 % (ref 39.0–52.0)
Hemoglobin: 17.1 g/dL — ABNORMAL HIGH (ref 13.0–17.0)
MCH: 31.4 pg (ref 26.0–34.0)
MCHC: 33.9 g/dL (ref 30.0–36.0)
MCV: 92.7 fL (ref 80.0–100.0)
Platelets: 246 10*3/uL (ref 150–400)
RBC: 5.45 MIL/uL (ref 4.22–5.81)
RDW: 11.9 % (ref 11.5–15.5)
WBC: 9.6 10*3/uL (ref 4.0–10.5)
nRBC: 0 % (ref 0.0–0.2)

## 2022-11-26 LAB — COMPREHENSIVE METABOLIC PANEL
ALT: 30 U/L (ref 0–44)
AST: 31 U/L (ref 15–41)
Albumin: 3.7 g/dL (ref 3.5–5.0)
Alkaline Phosphatase: 64 U/L (ref 38–126)
Anion gap: 15 (ref 5–15)
BUN: 11 mg/dL (ref 6–20)
CO2: 22 mmol/L (ref 22–32)
Calcium: 9 mg/dL (ref 8.9–10.3)
Chloride: 101 mmol/L (ref 98–111)
Creatinine, Ser: 0.87 mg/dL (ref 0.61–1.24)
GFR, Estimated: >60 mL/min (ref >60)
Glucose, Bld: 117 mg/dL — ABNORMAL HIGH (ref 70–99)
Potassium: 3.2 mmol/L — ABNORMAL LOW (ref 3.5–5.1)
Sodium: 138 mmol/L (ref 135–145)
Total Bilirubin: 0.9 mg/dL (ref 0.3–1.2)
Total Protein: 6.7 g/dL (ref 6.5–8.1)

## 2022-11-26 LAB — URINALYSIS, ROUTINE W REFLEX MICROSCOPIC
Bilirubin Urine: NEGATIVE
Glucose, UA: NEGATIVE mg/dL
Hgb urine dipstick: NEGATIVE
Ketones, ur: 20 mg/dL — AB
Leukocytes,Ua: NEGATIVE
Nitrite: NEGATIVE
Protein, ur: NEGATIVE mg/dL
Specific Gravity, Urine: 1.041 — ABNORMAL HIGH (ref 1.005–1.030)
pH: 6 (ref 5.0–8.0)

## 2022-11-26 LAB — TROPONIN I (HIGH SENSITIVITY): Troponin I (High Sensitivity): 9 ng/L (ref <18)

## 2022-11-26 LAB — LIPASE, BLOOD: Lipase: 29 U/L (ref 11–51)

## 2022-11-26 MED ORDER — POTASSIUM CHLORIDE 10 MEQ/100ML IV SOLN
10.0000 meq | Freq: Once | INTRAVENOUS | Status: AC
  Administered 2022-11-26: 10 meq via INTRAVENOUS
  Filled 2022-11-26: qty 100

## 2022-11-26 MED ORDER — HYDROMORPHONE HCL 1 MG/ML IJ SOLN
0.5000 mg | Freq: Once | INTRAMUSCULAR | Status: AC
  Administered 2022-11-26: 0.5 mg via INTRAVENOUS
  Filled 2022-11-26: qty 1

## 2022-11-26 MED ORDER — SODIUM CHLORIDE 0.9 % IV BOLUS
1000.0000 mL | Freq: Once | INTRAVENOUS | Status: AC
  Administered 2022-11-26: 1000 mL via INTRAVENOUS

## 2022-11-26 MED ORDER — IOHEXOL 300 MG/ML  SOLN
100.0000 mL | Freq: Once | INTRAMUSCULAR | Status: AC | PRN
  Administered 2022-11-26: 100 mL via INTRAVENOUS

## 2022-11-26 MED ORDER — SODIUM CHLORIDE 0.9 % IV SOLN
12.5000 mg | Freq: Four times a day (QID) | INTRAVENOUS | Status: DC | PRN
  Administered 2022-11-27: 12.5 mg via INTRAVENOUS
  Filled 2022-11-26: qty 12.5

## 2022-11-26 MED ORDER — PROCHLORPERAZINE EDISYLATE 10 MG/2ML IJ SOLN
10.0000 mg | Freq: Once | INTRAMUSCULAR | Status: AC
  Administered 2022-11-26: 10 mg via INTRAVENOUS
  Filled 2022-11-26: qty 2

## 2022-11-26 MED ORDER — FENTANYL CITRATE PF 50 MCG/ML IJ SOSY
50.0000 ug | PREFILLED_SYRINGE | Freq: Once | INTRAMUSCULAR | Status: AC
  Administered 2022-11-26: 50 ug via INTRAVENOUS
  Filled 2022-11-26: qty 1

## 2022-11-26 NOTE — ED Triage Notes (Signed)
Pt reports LLQ pain that has been present for 2 weeks but worse today. Pain radiates into L chest, accompanied by N/V.  Seen for same 2 weeks ago, given fluids and cleared for DC after CT.  H/o abd surgery causing chronic nausea, not relieved by home phenerghan.  Low BMI - 18

## 2022-11-26 NOTE — ED Provider Notes (Signed)
[facility]-EMERGENCY DEPT Provider Note   CSN: [id number] Arrival date & time: 11/26/22  1826     History  Chief Complaint  Patient presents with   Abdominal Pain    Edwin Hall is a 57 y.o. male.   Abdominal Pain Patient presents with abdominal pain.  Left upper abdomen into the chest somewhat acute on chronic.  Worse last 4 days although has had episodes of it for a while now.  Has had multiple abdominal surgeries in including esophagectomy.  Over the last months has had more diarrhea.  States decreased oral intake.  Is down about 18 pounds over the last month.  No fevers.    Past Medical History:  Diagnosis Date   Anxiety    Asthma    Barrett's esophagus    Blood transfusion without reported diagnosis    Complication of anesthesia    nasuea and vomiting   Food allergy    Headache    Hyperlipidemia    IBS (irritable bowel syndrome)    Iron deficiency anemia    Urticaria    Past Surgical History:  Procedure Laterality Date   ABDOMINAL SURGERY  12/2015   pylorus plasty   APPENDECTOMY  1984   BIOPSY  05/18/2020   Procedure: BIOPSY;  Surgeon: Kerin Salen, MD;  Location: [facility];  Service: Gastroenterology;;   CHOLECYSTECTOMY  2008   ESOPHAGOGASTRODUODENOSCOPY (EGD) WITH PROPOFOL N/A 05/18/2020   Procedure: ESOPHAGOGASTRODUODENOSCOPY (EGD) WITH PROPOFOL;  Surgeon: Kerin Salen, MD;  Location: [facility];  Service: Gastroenterology;  Laterality: N/A;   GASTROSTOMY TUBE PLACEMENT     HIATAL HERNIA REPAIR  07/2015   PARTIAL ESOPHAGECTOMY  12/10/2021   TALC PLEURODESIS     TONSILLECTOMY  1980   VENTRAL HERNIA REPAIR  2010     Home Medications Prior to Admission medications   Medication Sig Start Date End Date Taking? Authorizing Provider  acetaminophen (TYLENOL) 500 MG tablet Take 1,000 mg by mouth every 6 (six) hours as needed for moderate pain.    [provider]  aspirin 81 MG EC tablet Take 81 mg by mouth at bedtime.  Swallow whole.    [provider]  chlorpheniramine-HYDROcodone (TUSSIONEX) 10-8 MG/5ML Take 5 mLs by mouth every 12 (twelve) hours as needed for cough. Patient not taking: Reported on 09/22/2022    [provider]  Cholecalciferol (VITAMIN D3) 10 MCG (400 UNIT) CAPS Take 400 Units by mouth daily.    [provider]  Cyanocobalamin (VITAMIN B 12 PO) Take 1,000 mcg by mouth daily.    [provider]  cyclobenzaprine (FLEXERIL) 10 MG tablet Take 10 mg by mouth 3 (three) times daily as needed for muscle spasms. 07/05/22   [provider]  famotidine (PEPCID) 40 MG tablet Take 40 mg by mouth 2 (two) times daily. 06/21/22   [provider]  Ferrous Sulfate (IRON) 325 (65 Fe) MG TABS Take 325 mg by mouth every other day.  05/12/20   [provider]  ipratropium (ATROVENT) 0.06 % nasal spray USE TWO SPRAYS IN EACH NOSTRIL TWICE DAILY AS DIRECTED. Patient taking differently: Place 1 spray into both nostrils daily as needed for rhinitis. 05/22/20   Marcelyn Bruins, MD  Magnesium 250 MG TABS Take 250 mg by mouth daily. Patient not taking: Reported on 09/22/2022    [provider]  metoCLOPramide (REGLAN) 10 MG tablet Take 10 mg by mouth 2 (two) times daily before a meal. 06/30/22   [provider]  montelukast (SINGULAIR) 10 MG tablet Take 10 mg by mouth daily as needed (allergies). 12/11/21   [provider]  Multiple Vitamin (MULTIVITAMIN WITH MINERALS) TABS tablet Take 1 tablet by mouth daily.    [provider]  pantoprazole (PROTONIX) 40 MG tablet Take 40 mg by mouth 2 (two) times daily.    [provider]  PROAIR DIGIHALER 108 [phone] Base) MCG/ACT AEPB Take 2 puffs by mouth every 4 (four) hours as needed (sob/wheezing). 09/22/21   [provider]  Probiotic Product (PROBIOTIC ADVANCED) CAPS Take 1 capsule by mouth 2 (two) times daily.    [provider]  promethazine (PHENERGAN) 25  MG tablet Take 25 mg by mouth every 6 (six) hours as needed for nausea or vomiting.    [provider]  rosuvastatin (CRESTOR) 20 MG tablet Take 20 mg by mouth at bedtime.    [provider]  sucralfate (CARAFATE) 1 g tablet Take 1 tablet (1 g total) by mouth 4 (four) times daily -  with meals and at bedtime. 10/17/20   Quintella Reichert, MD  traZODone (DESYREL) 50 MG tablet Take 50 mg by mouth at bedtime.    [provider]  Zinc 50 MG TABS Take 50 mg by mouth daily.     [provider]  azelastine (ASTELIN) 0.1 % nasal spray 2 sprays per nostril twice a day for control of nasal drainage. Patient not taking: Reported on 05/02/2020 11/27/19 05/02/20  Kennith Gain, MD      Allergies    Mushroom extract complex, Other, Shellfish allergy, Reglan [metoclopramide], and Tetracyclines & related    Review of Systems   Review of Systems  Gastrointestinal:  Positive for abdominal pain.    Physical Exam Updated Vital Signs BP (!) 123/99   Pulse 97   Temp 98.3 F (36.8 C) (Oral)   Resp 16   Ht 5\' 8"  (1.727 m)   Wt 54.4 kg   SpO2 100%   BMI 18.25 kg/m  Physical Exam Vitals and nursing note reviewed.  HENT:     Head: Atraumatic.  Abdominal:     Tenderness: There is abdominal tenderness.     Comments: Tenderness in mid and left upper abdomen.  No rebound or guarding.  No hernia palpated.  Skin:    General: Skin is warm.     Capillary Refill: Capillary refill takes less than 2 seconds.  Neurological:     Mental Status: He is alert and oriented to person, place, and time.     ED Results / Procedures / Treatments   Labs (all labs ordered are listed, but only abnormal results are displayed) Labs Reviewed  CBC - Abnormal; Notable for the following components:      Result Value   Hemoglobin 17.1 (*)    All other components within normal limits  COMPREHENSIVE METABOLIC PANEL - Abnormal; Notable for the following components:   Potassium 3.2 (*)     Glucose, Bld 117 (*)    All other components within normal limits  LIPASE, BLOOD  URINALYSIS, ROUTINE W REFLEX MICROSCOPIC  TROPONIN I (HIGH SENSITIVITY)    EKG None  Radiology CT CHEST ABDOMEN PELVIS W CONTRAST  Result Date: 11/26/2022 CLINICAL DATA:  [id number] Abdominal pain [id number] abd pain. LLQ pain radiating to left side of chest onset four days ago, diarrhea, shortness of breath. History of surgery for esophageal hernia. EXAM: CT CHEST, ABDOMEN, AND PELVIS WITH CONTRAST TECHNIQUE: Multidetector CT imaging of the chest, abdomen and pelvis was  performed following the standard protocol during bolus administration of intravenous contrast. RADIATION DOSE REDUCTION: This exam was performed according to the departmental dose-optimization program which includes automated exposure control, adjustment of the mA and/or kV according to patient size and/or use of iterative reconstruction technique. CONTRAST:  OMNIPAQUE IOHEXOL 300 MG/ML  SOLN COMPARISON:  CT chest, abdomen, pelvis 11/13/2022, CT abdomen pelvis 07/14/2022 FINDINGS: Cardiovascular: Right chest wall Port-A-Cath with tip terminating in the right atrium just distal to the superior cavoatrial junction. Normal heart size. No significant pericardial effusion. The thoracic aorta is normal in caliber. No atherosclerotic plaque of the thoracic aorta. Left anterior descending coronary artery calcifications. Mediastinum/Nodes: Persistently elevated left hemidiaphragm. No enlarged mediastinal, hilar, or axillary lymph nodes. Thyroid gland and trachea demonstrate no significant findings. Proximal esophagus is unremarkable. Query partial gastric pull-through distally again noted. Question associated herniation of the mid transverse colon along the gastric pull-through. Lungs/Pleura: Surgical changes related to a left lower lobe wedge resection. Left upper lobe linear atelectasis versus scarring. Bilateral upper lobe anterior vague ground-glass airspace  opacities possibly representing post radiation changes. No focal consolidation. No pulmonary nodule. No pulmonary mass. No pleural effusion. No pneumothorax. Musculoskeletal: No chest wall abnormality. No suspicious lytic or blastic osseous lesions. No acute displaced fracture. CT ABDOMEN PELVIS FINDINGS Hepatobiliary: No focal liver abnormality. Status post cholecystectomy no biliary dilatation. Pancreas: No focal lesion. Normal pancreatic contour. No surrounding inflammatory changes. No main pancreatic ductal dilatation. Spleen: Normal in size without focal abnormality. Adrenals/Urinary Tract: No adrenal nodule bilaterally. Bilateral kidneys enhance symmetrically. Subcentimeter hypodensity within the right kidney too small to characterize-no further follow-up indicated. No hydronephrosis. No hydroureter. The urinary bladder is unremarkable. Stomach/Bowel: Stomach is within normal limits. No evidence of bowel wall thickening or dilatation. The appendix is not definitely identified with no inflammatory changes in the right lower quadrant to suggest acute appendicitis. Vascular/Lymphatic: No abdominal aorta or iliac aneurysm. Mild atherosclerotic plaque of the aorta and its branches. No abdominal, pelvic, or inguinal lymphadenopathy. Reproductive: Prostate is unremarkable. Other: Stable right anterior abdominal fat density lesion likely representing fat necrosis. No intraperitoneal free fluid. No intraperitoneal free gas. No organized fluid collection. Musculoskeletal: No abdominal wall hernia or abnormality. No suspicious lytic or blastic osseous lesions. No acute displaced fracture. Grade 1 anterolisthesis of L4 on L5. Associated degenerative changes of the facet joints. IMPRESSION: 1. No acute intrathoracic, intra-abdominal, intrapelvic abnormality. 2. Query status post partial gastric pull-through surgical changes with persistently elevated left hemidiaphragm. Question associated chronic herniation of the mid  transverse colon along the gastric pull-through. No findings to suggest ischemia or associated bowel obstruction. 3. Likely status post left lower pulmonary lobe wedge resection. 4. Status post cholecystectomy. 5. Aortic Atherosclerosis (ICD10-I70.0). Electronically Signed   By: Tish Frederickson M.D.   On: 11/26/2022 22:19   DG Chest 2 View  Result Date: 11/26/2022 CLINICAL DATA:  Chest pain. EXAM: CHEST - 2 VIEW COMPARISON:  CT chest dated November 13, 2022 FINDINGS: The heart is normal in size. Right IJ access MediPort with distal tip at the SVC/RA junction, unchanged. Chronic elevation of the left hemidiaphragm with left basilar atelectasis. Surgical clips projecting over the left upper quadrant with moderate size left hiatal hernia. Thoracic spondylosis. IMPRESSION: 1. Chronic elevation of the left hemidiaphragm with left basilar atelectasis. 2. Moderate size left hiatal hernia. 3. No acute cardiopulmonary process. Electronically Signed   By: Larose Hires D.O.   On: 11/26/2022 20:33    Procedures Procedures    Medications Ordered  in ED Medications  HYDROmorphone (DILAUDID) injection 0.5 mg (has no administration in time range)  sodium chloride 0.9 % bolus 1,000 mL (has no administration in time range)  potassium chloride 10 mEq in 100 mL IVPB (has no administration in time range)  sodium chloride 0.9 % bolus 1,000 mL (1,000 mLs Intravenous New Bag/Given 11/26/22 2138)  fentaNYL (SUBLIMAZE) injection 50 mcg (50 mcg Intravenous Given 11/26/22 2136)  prochlorperazine (COMPAZINE) injection 10 mg (10 mg Intravenous Given 11/26/22 2135)  iohexol (OMNIPAQUE) 300 MG/ML solution 100 mL (100 mLs Intravenous Contrast Given 11/26/22 2144)    ED Course/ Medical Decision Making/ A&P                             Medical Decision Making Amount and/or Complexity of Data Reviewed Labs: ordered. Radiology: ordered.  Risk Prescription drug management.   Patient presents with abdominal pain nausea  vomiting.  History of same.  History of esophageal and bowel issues.  Multiple surgeries.  Decreased oral intake tachycardic.  Differential diagnosis includes dehydration/obstruction.  Had been seen about 2 weeks ago with similar symptoms and CT scan reassuring at that time.  Has continued to do bad since.  I reviewed notes from patient's PCP. CT scan reviewed here and similar before does not show obstruction or other clear cause of the pain.  Will give fluid bolus.  Will check urinalysis and supplement his mild hypokalemia.  Hopefully should be able to discharge home.  Care turned over to Dr Leonette Monarch        Final Clinical Impression(s) / ED Diagnoses Final diagnoses:  Dehydration  Nausea vomiting and diarrhea    Rx / DC Orders ED Discharge Orders     None         Davonna Belling, MD 11/26/22 2305

## 2022-11-26 NOTE — ED Notes (Signed)
Patient transported to CT. 

## 2022-11-26 NOTE — ED Provider Triage Note (Signed)
Emergency Medicine Provider Triage Evaluation Note  Edwin Hall , a 57 y.o. male  was evaluated in triage.  Pt complains of left lower quadrant abdominal pain with radiation into the left side of his chest.  Symptoms began 4 days ago.  Patient also describes yellow and green diarrhea during that time.  Has had 8 episodes today.  Has not been able to eat or drink since symptoms began.  Has associated shortness of breath.  States he has had similar symptoms in the past and was diagnosed with bowel obstruction.  Has had abdominal surgery for esophageal hernia.  Denies cardiac history.  Review of Systems  Positive: As above Negative: As above  Physical Exam  BP (!) 133/101 (BP Location: Right Arm)   Pulse (!) 124   Temp 98.3 F (36.8 C) (Oral)   Resp 18   Ht 5\' 8"  (1.727 m)   Wt 54.4 kg   SpO2 97%   BMI 18.25 kg/m  Gen:   Awake, no distress   Resp:  Normal effort  MSK:   Moves extremities without difficulty  Other:  Significant tenderness to palpation of the left lower and left upper quadrants.  Tachycardic.  Abdominal surgical scar  Medical Decision Making  Medically screening exam initiated at 7:57 PM.  Appropriate orders placed.  Edwin Hall was informed that the remainder of the evaluation will be completed by another provider, this initial triage assessment does not replace that evaluation, and the importance of remaining in the ED until their evaluation is complete.  Abdominal pain labs, chest pain labs, chest x-ray, CT abdomen pelvis with contrast   Edwin Reason, PA-C 11/26/22 1959

## 2022-11-27 MED ORDER — OXYCODONE-ACETAMINOPHEN 5-325 MG PO TABS
1.0000 | ORAL_TABLET | Freq: Once | ORAL | Status: AC
  Administered 2022-11-27: 1 via ORAL
  Filled 2022-11-27: qty 1

## 2022-11-27 MED ORDER — MAALOX MAX 400-400-40 MG/5ML PO SUSP: 10.0000 mL | Freq: Four times a day (QID) | ORAL | 0 refills | Status: DC | PRN

## 2022-11-27 MED ORDER — LIDOCAINE VISCOUS HCL 2 % MT SOLN: 10.0000 mL | Freq: Four times a day (QID) | OROMUCOSAL | 0 refills | Status: DC | PRN

## 2022-11-27 MED ORDER — ALUM & MAG HYDROXIDE-SIMETH 200-200-20 MG/5ML PO SUSP
30.0000 mL | Freq: Once | ORAL | Status: AC
  Administered 2022-11-27: 30 mL via ORAL
  Filled 2022-11-27: qty 30

## 2022-11-27 MED ORDER — LIDOCAINE VISCOUS HCL 2 % MT SOLN
15.0000 mL | Freq: Once | OROMUCOSAL | Status: AC
  Administered 2022-11-27: 15 mL via ORAL
  Filled 2022-11-27: qty 15

## 2022-11-27 MED ORDER — HEPARIN SOD (PORK) LOCK FLUSH 100 UNIT/ML IV SOLN
500.0000 [IU] | Freq: Once | INTRAVENOUS | Status: AC
  Administered 2022-11-27: 500 [IU]
  Filled 2022-11-27: qty 5

## 2022-11-27 MED ORDER — PROMETHAZINE HCL 25 MG RE SUPP: 25.0000 mg | Freq: Four times a day (QID) | RECTAL | 0 refills | Status: DC | PRN

## 2022-11-27 NOTE — ED Provider Notes (Signed)
I assumed care of this patient.  Please see previous provider note for further details of Hx, PE.  Briefly patient is a 57 y.o. male who presented with abd pain, N/V. H/o gastric loop sx. Labs and CT  reassuring. Pending UA. Getting IVF.  UA negative.  Patient still with pain. Improved with GI cocktail and percocet. Tolerating PO.  Feels much better.  The patient appears reasonably screened and/or stabilized for discharge and I doubt any other medical condition or other [facility] requiring further screening, evaluation, or treatment in the ED at this time. I have discussed the findings, Dx and Tx plan with the patient/family who expressed understanding and agree(s) with the plan. Discharge instructions discussed at length. The patient/family was given strict return precautions who verbalized understanding of the instructions. No further questions at time of discharge.  Disposition: Discharge  Condition: Good  ED Discharge Orders          Ordered    alum & mag hydroxide-simeth (MAALOX MAX) 476-546-50 MG/5ML suspension  Every 6 hours PRN        11/27/22 0547    lidocaine (XYLOCAINE) 2 % solution  Every 6 hours PRN        11/27/22 0547    promethazine (PHENERGAN) 25 MG suppository  Every 6 hours PRN        11/27/22 0547             Follow Up: Maggie Schwalbe, PA-C [place] [id number] [phone]   As needed         Fatima Blank, MD 11/27/22 [phone]

## 2022-11-27 NOTE — ED Notes (Signed)
Patient given saltine crackers and ginger ale for PO challenge. °

## 2022-12-23 ENCOUNTER — Encounter: Payer: Self-pay | Admitting: Oncology

## 2022-12-27 ENCOUNTER — Other Ambulatory Visit: Payer: Medicare (Managed Care)

## 2022-12-27 ENCOUNTER — Ambulatory Visit: Payer: Medicare (Managed Care) | Admitting: Oncology

## 2022-12-27 NOTE — Progress Notes (Deleted)
[facility] Follow up Visit:  Patient Care Team: Nodal, Alphonzo Dublin, PA-C as PCP - General (Physician Assistant)  CHIEF COMPLAINTS/PURPOSE OF CONSULTATION:   HISTORY OF PRESENTING ILLNESS: Edwin Hall 57 y.o. male is here because of easy bruising.  Medical history notable for CAD, GERD, Barrett Esophagus, recurrent hiatal hernia  Patient has a complex surgical history Open appendectomy, open cholecystectomy in the 80s/90s.  Open Nissen fundoplication around 0000000 in [place].   Recurrent hiatal hernia repair with re-do fundoplication in the 99991111 via the chest?(?thoracoscopic PEH repair) followed by another one via abdominal approach Open ventral hernia repair in the 2000s with mesh placement Open pyloroplasty with PGJ placement by Dr. Pixie Casino in 2017. Recurrent hiatal hernia with colon herniating through the hiatus and had left thoracotomy, extensive lysis of pleural adhesions, primary diaphragmatic repair and partial 7th rib resection in 2021 Pneumothorax in July 2021 following left thoracotomy for hiatal hernia repair in June 2021.   Developed progressive dysphagia and heartburn requiring EGD/dilation.  EGD 08/04/21 - long segment Barrett's, ?loose [place], evidence of pyloroplasty Manometry 05/01/2021 - IR 4.4, DCI 1598, 100% intact swallow. ? 3.4 cm hiatal hernia. Barium swallow 02/09/21 - spontaneous GERD, tablet did not pass into stomach, small hiatal hernia. Esophageal dysmotility  Ivor Lewis Esophagectomy in January 99991111 course complicated by septic shock and respiratory failure.  Hospitalized for 3 months and discharged to rehab  April 30, 2022: WBC 7.9 hemoglobin 15.6 platelet count 215; 67 segs 24 lymphs 7 monos 1 EO 1 basophil  July 02 2022:  CT chest.  Post esophagectomy with gastric pull-through without evidence of complication. No evidence of volvulus or obstruction. Subtle patchy and bandlike peribronchovascular groundglass opacity in the upper lobes  and right middle lobe compatible with post diffuse alveolar damage recovery.  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 Recommend correlation for subclavian steal  August 06, 2022 B12 greater than 1500 Ferritin 64  September 01 2022:  CT AP.  No acute abnormality in the abdomen or pelvis.  Again noted is a hiatal hernia with a portion of the transverse  colon extending into the lower chest. No evidence for bowel  obstruction or inflammatory changes.  Postsurgical changes in the left lower lobe with elevation of the left hemidiaphragm.  Aortic Atherosclerosis   September 15 2022:  [facility] Hematology Consult Reviewed surgical history with patient.   During the past month noted that he has developed increased bruising such that it occurs on his thighs when he crosses his legs.  He has bruising on forearms.  Developed a bruise on his heel the other day and can not recall any particular trauma.  Began taking ASA 81 mg daily a few years ago which he is still taking.  Takes lyrica PRN for pain. Not taking any other NSAIDS.  He is not aware of having any bleeding complications with any of the multiple surgeries he has undergone.  Received PRBC's in the past.  Has not received IV iron but is taking oral iron.  No epistaxis, gingival bleeding, no hematuria.  No hematochezia, or melena.  No joint or deep muscle bleeding.   Fatigued and has difficulty getting up the steps to his apartment.  Intolerant to cold.  Has developed hair loss since surgery in January   Had colonoscopy > 5 yrs ago which per patient was negative.    Social:  Single.  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  No tobacco or EtOH  [facility] Mother alive 73 dementia Father died 50's heart problems Brother alive 84 multiple medical problems Sister alive 2 well  WBC 8.8 hemoglobin 15.4 MCV 103 platelet count  222; 72 segs 17 lymphs 7 monos 1 EO 1 basophil INR 0.9 PTT 24 fibrinogen 557 Factor VIII activity 301 von Willebrand factor antigen 478 von Willebrand factor activity 338 Vitamin E (alpha) 12.4 vitamin E (Gamma) 0.6 (lower limit normal 0.5) Vitamin A 49.6 Vitamin B1 96.6 Vitamin B6 19.3 Vitamin B12 4981 folate 30 ferritin 117 Vitamin D, 25-hydroxy 54.67 Phosphorus 2.5 (LLN 2.5)  Copper and zinc levels not available  September 22 2022:  Scheduled follow up regarding increased bruising and malabsorption Reviewed results of labs with patient.  He is not sleeping well.  Appetite still poor.  Anorectic.  Fatigued.  Can't get comfortable to sleep.  Still bruising.  Takes ASA 81 mg daily.   DEXA scan not performed yet Has lost 4 lbs.    Review of Systems  Constitutional:  Positive for fatigue and unexpected weight change. Negative for fever.       Has to make himself eat.  Has lost 23 lbs since surgery in January 2023  HENT:   Positive for trouble swallowing and voice change. Negative for mouth sores, nosebleeds and sore throat.        Voice a lot higher now.    Eyes:  Negative for eye problems and icterus.       Vision changes:  None  Respiratory:  Positive for cough. Negative for chest tightness, hemoptysis, shortness of breath and wheezing.        PND:  none Orthopnea:  none DOE:    Cardiovascular:  Negative for chest pain, leg swelling and palpitations.       Swelling in left foot  Gastrointestinal:  Positive for abdominal pain, blood in stool and nausea. Negative for abdominal distention, constipation, diarrhea and vomiting.       Feels tight in cervical phase of swallowing Periumbilical pain after eating despite carafate No hematochezia recently Nausea after eating  Endocrine: Negative for hot flashes.       Cold intolerance:  none Heat intolerance:  none  Genitourinary:  Positive for frequency and nocturia. Negative for bladder incontinence, difficulty urinating, dysuria and  hematuria.   Musculoskeletal:  Positive for arthralgias, back pain, gait problem and myalgias. Negative for neck pain and neck stiffness.  Skin:  Negative for itching, rash and wound.  Neurological:  Positive for extremity weakness and gait problem. Negative for dizziness, headaches, light-headedness, numbness and speech difficulty.       Gait problems due to arthralgias in hips  Hematological:  Negative for adenopathy. Bruises/bleeds easily.  Psychiatric/Behavioral:  Positive for depression and sleep disturbance. Negative for suicidal ideas. The patient is not nervous/anxious.        Sleeps poorly due to arthralgias and myalgias    MEDICAL HISTORY: Past Medical History:  Diagnosis Date   Anxiety    Asthma    Barrett's esophagus    Blood transfusion without reported diagnosis    Complication of anesthesia    nasuea and vomiting   Food allergy    Headache    Hyperlipidemia    IBS (irritable bowel syndrome)    Iron deficiency anemia    Urticaria     SURGICAL HISTORY: Past Surgical History:  Procedure Laterality Date   ABDOMINAL SURGERY  12/2015   pylorus plasty  APPENDECTOMY  1984   BIOPSY  05/18/2020   Procedure: BIOPSY;  Surgeon: Ronnette Juniper, MD;  Location: [facility];  Service: Gastroenterology;;   CHOLECYSTECTOMY  2008   ESOPHAGOGASTRODUODENOSCOPY (EGD) WITH PROPOFOL N/A 05/18/2020   Procedure: ESOPHAGOGASTRODUODENOSCOPY (EGD) WITH PROPOFOL;  Surgeon: Ronnette Juniper, MD;  Location: [facility];  Service: Gastroenterology;  Laterality: N/A;   GASTROSTOMY TUBE PLACEMENT     HIATAL HERNIA REPAIR  07/2015   PARTIAL ESOPHAGECTOMY  12/10/2021   TALC PLEURODESIS     TONSILLECTOMY  1980   VENTRAL HERNIA REPAIR  2010    SOCIAL HISTORY: Social History   Socioeconomic History   Marital status: Single    Spouse name: Not on file   Number of children: Not on file   Years of education: Not on file   Highest education level: Not on file  Occupational History   Not on file   Tobacco Use   Smoking status: Never   Smokeless tobacco: Never  Vaping Use   Vaping Use: Never used  Substance and Sexual Activity   Alcohol use: No   Drug use: No   Sexual activity: Not Currently  Other Topics Concern   Not on file  Social History Narrative   Not on file   Social Determinants of Health   Financial Resource Strain: Not on file  Food Insecurity: No Food Insecurity (10/13/2020)   Hunger Vital Sign    Worried About Running Out of Food in the Last Year: Never true    Ran Out of Food in the Last Year: Never true  Transportation Needs: No Transportation Needs (10/13/2020)   PRAPARE - Hydrologist (Medical): No    Lack of Transportation (Non-Medical): No  Physical Activity: Not on file  Stress: Not on file  Social Connections: Not on file  Intimate Partner Violence: Not on file    FAMILY HISTORY Family History  Problem Relation Age of Onset   Parkinson's disease Mother    Angioedema Mother    Dementia Mother    Heart disease Father    Lung cancer Brother 40       non-smoker   Allergic rhinitis Neg Hx    Asthma Neg Hx    Eczema Neg Hx    Immunodeficiency Neg Hx    Urticaria Neg Hx     ALLERGIES:  is allergic to mushroom extract complex, other, shellfish allergy, reglan [metoclopramide], zofran [ondansetron hcl], and tetracyclines & related.  MEDICATIONS:  Current Outpatient Medications  Medication Sig Dispense Refill   acetaminophen (TYLENOL) 500 MG tablet Take 1,000 mg by mouth every 6 (six) hours as needed for moderate pain.     alum & mag hydroxide-simeth (MAALOX MAX) 400-400-40 MG/5ML suspension Take 10 mLs by mouth every 6 (six) hours as needed for indigestion. 355 mL 0   aspirin 81 MG EC tablet Take 81 mg by mouth at bedtime. Swallow whole.     chlorpheniramine-HYDROcodone (TUSSIONEX) 10-8 MG/5ML Take 5 mLs by mouth every 12 (twelve) hours as needed for cough. (Patient not taking: Reported on 09/22/2022)      Cholecalciferol (VITAMIN D3) 10 MCG (400 UNIT) CAPS Take 400 Units by mouth daily.     Cyanocobalamin (VITAMIN B 12 PO) Take 1,000 mcg by mouth daily.     cyclobenzaprine (FLEXERIL) 10 MG tablet Take 10 mg by mouth 3 (three) times daily as needed for muscle spasms.     famotidine (PEPCID) 40 MG tablet Take 40 mg by mouth 2 (two)  times daily.     Ferrous Sulfate (IRON) 325 (65 Fe) MG TABS Take 325 mg by mouth every other day.      ipratropium (ATROVENT) 0.06 % nasal spray USE TWO SPRAYS IN EACH NOSTRIL TWICE DAILY AS DIRECTED. (Patient taking differently: Place 1 spray into both nostrils daily as needed for rhinitis.) 15 mL 0   lidocaine (XYLOCAINE) 2 % solution Use as directed 10 mLs in the mouth or throat every 6 (six) hours as needed (stomach pain). 100 mL 0   Magnesium 250 MG TABS Take 250 mg by mouth daily. (Patient not taking: Reported on 09/22/2022)     metoCLOPramide (REGLAN) 10 MG tablet Take 10 mg by mouth 2 (two) times daily before a meal.     montelukast (SINGULAIR) 10 MG tablet Take 10 mg by mouth daily as needed (allergies).     Multiple Vitamin (MULTIVITAMIN WITH MINERALS) TABS tablet Take 1 tablet by mouth daily.     pantoprazole (PROTONIX) 40 MG tablet Take 40 mg by mouth 2 (two) times daily.     PROAIR DIGIHALER 108 (90 Base) MCG/ACT AEPB Take 2 puffs by mouth every 4 (four) hours as needed (sob/wheezing).     Probiotic Product (PROBIOTIC ADVANCED) CAPS Take 1 capsule by mouth 2 (two) times daily.     promethazine (PHENERGAN) 25 MG suppository Place 1 suppository (25 mg total) rectally every 6 (six) hours as needed for nausea or vomiting. 12 each 0   promethazine (PHENERGAN) 25 MG tablet Take 25 mg by mouth every 6 (six) hours as needed for nausea or vomiting.     rosuvastatin (CRESTOR) 20 MG tablet Take 20 mg by mouth at bedtime.     sucralfate (CARAFATE) 1 g tablet Take 1 tablet (1 g total) by mouth 4 (four) times daily -  with meals and at bedtime. 42 tablet 0   traZODone  (DESYREL) 50 MG tablet Take 50 mg by mouth at bedtime.     Zinc 50 MG TABS Take 50 mg by mouth daily.      No current facility-administered medications for this visit.   Facility-Administered Medications Ordered in Other Visits  Medication Dose Route Frequency Provider Last Rate Last Admin   sodium chloride flush (NS) 0.9 % injection 10 mL  10 mL Intracatheter PRN Barbee Cough, MD   10 mL at 09/15/22 1258    PHYSICAL EXAMINATION:  ECOG PERFORMANCE STATUS: 1 - Symptomatic but completely ambulatory   There were no vitals filed for this visit.  There were no vitals filed for this visit.   Physical Exam Vitals and nursing note reviewed.  Constitutional:      Appearance: Normal appearance. He is normal weight. He is not diaphoretic.  HENT:     Head: Normocephalic and atraumatic.     Right Ear: External ear normal.     Left Ear: External ear normal.     Nose: Nose normal.  Eyes:     General: No scleral icterus.    Conjunctiva/sclera: Conjunctivae normal.     Pupils: Pupils are equal, round, and reactive to light.  Cardiovascular:     Rate and Rhythm: Normal rate and regular rhythm.     Heart sounds: Normal heart sounds. No murmur heard.    No friction rub. No gallop.  Pulmonary:     Effort: Pulmonary effort is normal. No respiratory distress.     Breath sounds: Normal breath sounds. No stridor.  Abdominal:     General: Bowel sounds are normal.  There is no distension.     Palpations: Abdomen is soft. There is no mass.  Musculoskeletal:        General: No swelling, tenderness, deformity or signs of injury. Normal range of motion.     Cervical back: Normal range of motion and neck supple. No rigidity or tenderness.  Lymphadenopathy:     Head:     Right side of head: No submental, submandibular, tonsillar, preauricular, posterior auricular or occipital adenopathy.     Left side of head: No submental, submandibular, tonsillar, preauricular, posterior auricular or  occipital adenopathy.     Cervical: No cervical adenopathy.     Right cervical: No superficial, deep or posterior cervical adenopathy.    Left cervical: No superficial, deep or posterior cervical adenopathy.     Upper Body:     Right upper body: No supraclavicular or axillary adenopathy.     Left upper body: No supraclavicular or axillary adenopathy.     Lower Body: No right inguinal adenopathy. No left inguinal adenopathy.  Skin:    Coloration: Skin is not jaundiced or pale.  Neurological:     General: No focal deficit present.     Mental Status: He is alert and oriented to person, place, and time.  Psychiatric:        Mood and Affect: Mood normal.        Behavior: Behavior normal.        Thought Content: Thought content normal.        Judgment: Judgment normal.      LABORATORY DATA: I have personally reviewed the data as listed:  Admission on 11/26/2022, Discharged on 11/27/2022  Component Date Value Ref Range Status   WBC 11/26/2022 9.6  4.0 - 10.5 K/uL Final   RBC 11/26/2022 5.45  4.22 - 5.81 MIL/uL Final   Hemoglobin 11/26/2022 17.1 (H)  13.0 - 17.0 g/dL Final   HCT 11/26/2022 50.5  39.0 - 52.0 % Final   MCV 11/26/2022 92.7  80.0 - 100.0 fL Final   MCH 11/26/2022 31.4  26.0 - 34.0 pg Final   MCHC 11/26/2022 33.9  30.0 - 36.0 g/dL Final   RDW 11/26/2022 11.9  11.5 - 15.5 % Final   Platelets 11/26/2022 246  150 - 400 K/uL Final   nRBC 11/26/2022 0.0  0.0 - 0.2 % Final   Performed at [facility], [place] [address] [place], [place] [address]   Troponin I (High Sensitivity) 11/26/2022 9  <18 ng/L Final   Comment: (NOTE) Elevated high sensitivity troponin I (hsTnI) values and significant  changes across serial measurements may suggest ACS but many other  chronic and acute conditions are known to elevate hsTnI results.  Refer to the "Links" section for chest pain algorithms and additional  guidance. Performed at [facility], [place]  [address], [place], [place] [address]    Sodium 11/26/2022 138  135 - 145 mmol/L Final   Potassium 11/26/2022 3.2 (L)  3.5 - 5.1 mmol/L Final   Chloride 11/26/2022 101  98 - 111 mmol/L Final   CO2 11/26/2022 22  22 - 32 mmol/L Final   Glucose, Bld 11/26/2022 117 (H)  70 - 99 mg/dL Final   Glucose reference range applies only to samples taken after fasting for at least 8 hours.   BUN 11/26/2022 11  6 - 20 mg/dL Final   Creatinine, Ser 11/26/2022 0.87  0.61 - 1.24 mg/dL Final   Calcium 11/26/2022 9.0  8.9 - 10.3 mg/dL  Final   Total Protein 11/26/2022 6.7  6.5 - 8.1 g/dL Final   Albumin 11/26/2022 3.7  3.5 - 5.0 g/dL Final   AST 11/26/2022 31  15 - 41 U/L Final   ALT 11/26/2022 30  0 - 44 U/L Final   Alkaline Phosphatase 11/26/2022 64  38 - 126 U/L Final   Total Bilirubin 11/26/2022 0.9  0.3 - 1.2 mg/dL Final   GFR, Estimated 11/26/2022 >60  >60 mL/min Final   Comment: (NOTE) Calculated using the CKD-EPI Creatinine Equation (2021)    Anion gap 11/26/2022 15  5 - 15 Final   Performed at [facility], [place] [address] [place], [place] [address]   Lipase 11/26/2022 29  11 - 51 U/L Final   Performed at [facility], [place] [address], [place], [place] [address]   Color, Urine 11/26/2022 STRAW (A)  YELLOW Final   APPearance 11/26/2022 HAZY (A)  CLEAR Final   Specific Gravity, Urine 11/26/2022 1.041 (H)  1.005 - 1.030 Final   pH 11/26/2022 6.0  5.0 - 8.0 Final   Glucose, UA 11/26/2022 NEGATIVE  NEGATIVE mg/dL Final   Hgb urine dipstick 11/26/2022 NEGATIVE  NEGATIVE Final   Bilirubin Urine 11/26/2022 NEGATIVE  NEGATIVE Final   Ketones, ur 11/26/2022 20 (A)  NEGATIVE mg/dL Final   Protein, ur 11/26/2022 NEGATIVE  NEGATIVE mg/dL Final   Nitrite 11/26/2022 NEGATIVE  NEGATIVE Final   Leukocytes,Ua 11/26/2022 NEGATIVE  NEGATIVE Final   Performed at [place] [address], [place], [place] [address]    RADIOGRAPHIC STUDIES: I have  personally reviewed the radiological images as listed and agree with the findings in the report  No results found.  ASSESSMENT/PLAN 57 year old male with CAD who has a complicated history of upper GI surgeries which culminated in a gastrectomy performed in January 99991111 complicated by a prolonged postoperative course.    Iron deficiency:  Secondary poor iron absorption but may have ongoing GI blood loss.    Increased bruising:  This is a new event.   Patient's bleeding history has been benign especially when considering the number of surgical procedures he has undergone. Coagulation testing including CBC with diff, PT/PTT/ von Willebrand profile was normal.  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  Labs from September 15 2022 indicated that absorption was reasonably intact.     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  Vitamin D level, Calcium, Phosphate adequate.  Refer for DEXA scan.  Consider Rheumatologic evaluation in the future  Fatigue:  Likely multifactorial.  Still recovering from complications of sepsis earlier this year.  Appears to be suffering from depression with disturbances in sleep and appetite.  Might benefit from more intense pharmacologic intervention.      Cancer Staging  No matching staging information was found for the patient.   No problem-specific Assessment & Plan notes found for this encounter.   No orders of the defined types were placed in this encounter.   All questions were answered. The patient knows to call the clinic with any problems, questions or concerns.  This note was electronically signed.    Barbee Cough, MD  12/27/2022 8:40  AM

## 2022-12-28 ENCOUNTER — Ambulatory Visit: Payer: Medicare (Managed Care) | Admitting: Dietician

## 2022-12-28 NOTE — Progress Notes (Signed)
Nutrition Follow UP: Reached out to patient at home/mobile telephone number.    Patient recently in ER with abdominal pain.  He states that has resolved, but continues with anorexia (he wants to speak with MD about appetite stimulant) and financial concerns due to a reduction in SNAP benefits so he doesn't have as much money for food.  Still some nausea and he uses Gingerale to settle stomach.  Wilburn Mylar has very painful GERD but that is not usual.  He has not yet had anything to eat today.   Yesterday's intake: B: Yogurt (regular)  L:  Fried bologna sandwich D: Toast with peanut butter  Coffee (cream with truvia), coke and Gingerale  Not using ONS ($).  Loves fruit but doesn't buy due to finances.  He is discouraged and just wants to feel better.  Medications: reviewed     Labs: 11/26/22 K+ 3.2   Anthropometrics: weight loss continues 6#(5%) 2 months    Height: 66"   Weight:  Reported CBD  118# 11/26/22  120# 09/22/22  126.4# 07/13/22  126# 02/09/22  128#  BMI: 20.55   Estimated Nutritional Needs:    Kcal:  1700-1900   Protein:  85-100 g   Fluid:  >/=1.7L   MALNUTRITION DIAGNOSIS: Moderate Malnutrition related to chronic illness (Barrett's esophagus s/p esophagectomy) as evidenced by mild fat depletion, moderate fat depletion, severe muscle depletion, moderate muscle depletion diagnosed by IP RD.  Weight loss has continued and patient reports lack of appetite.      INTERVENTION:    Will ask CSW to put together a bag from food pantry and reach out to assess potential resources. (He likes baked beans, soups, pasta rice, fruits)  Educated on importance of adequate calories to improved energy/maintain weight.    Encouraged budget friendly high potassium foods.   Trial CIB or high protein whole milk between meals.   Emailed Nutrition Tip sheet for  high protein high calorie snacking with contact information.   MONITORING, EVALUATION, GOAL: weight trends, nutrition impact  symptoms, PO intake, labs   Goal slow gain 2-5# per month  Next Visit: Remote after MD visit    April Manson, RDN, LDN Registered Dietitian, [place] Part Time Remote (Usual office hours: Tuesday-Thursday) Mobile: [phone] Remote Office: [phone]

## 2023-01-03 ENCOUNTER — Inpatient Hospital Stay (INDEPENDENT_AMBULATORY_CARE_PROVIDER_SITE_OTHER): Payer: Medicare (Managed Care) | Admitting: Oncology

## 2023-01-03 ENCOUNTER — Inpatient Hospital Stay: Payer: Medicare (Managed Care) | Attending: Oncology

## 2023-01-03 VITALS — BP 140/95 | HR 97 | Temp 98.2°F | Resp 14 | Ht 68.0 in | Wt 123.8 lb

## 2023-01-03 DIAGNOSIS — E785 Hyperlipidemia, unspecified: Secondary | ICD-10-CM | POA: Insufficient documentation

## 2023-01-03 DIAGNOSIS — I2510 Atherosclerotic heart disease of native coronary artery without angina pectoris: Secondary | ICD-10-CM | POA: Diagnosis not present

## 2023-01-03 DIAGNOSIS — R112 Nausea with vomiting, unspecified: Secondary | ICD-10-CM | POA: Diagnosis not present

## 2023-01-03 DIAGNOSIS — E611 Iron deficiency: Secondary | ICD-10-CM | POA: Diagnosis not present

## 2023-01-03 DIAGNOSIS — K3189 Other diseases of stomach and duodenum: Secondary | ICD-10-CM

## 2023-01-03 DIAGNOSIS — K909 Intestinal malabsorption, unspecified: Secondary | ICD-10-CM | POA: Diagnosis not present

## 2023-01-03 DIAGNOSIS — T148XXA Other injury of unspecified body region, initial encounter: Secondary | ICD-10-CM

## 2023-01-03 DIAGNOSIS — D508 Other iron deficiency anemias: Secondary | ICD-10-CM

## 2023-01-03 DIAGNOSIS — Z79899 Other long term (current) drug therapy: Secondary | ICD-10-CM | POA: Insufficient documentation

## 2023-01-03 DIAGNOSIS — R634 Abnormal weight loss: Secondary | ICD-10-CM

## 2023-01-03 DIAGNOSIS — Z7982 Long term (current) use of aspirin: Secondary | ICD-10-CM | POA: Diagnosis not present

## 2023-01-03 DIAGNOSIS — K219 Gastro-esophageal reflux disease without esophagitis: Secondary | ICD-10-CM | POA: Insufficient documentation

## 2023-01-03 LAB — CBC WITH DIFFERENTIAL/PLATELET
Abs Immature Granulocytes: 0.01 10*3/uL (ref 0.00–0.07)
Basophils Absolute: 0 10*3/uL (ref 0.0–0.1)
Basophils Relative: 1 %
Eosinophils Absolute: 0.1 10*3/uL (ref 0.0–0.5)
Eosinophils Relative: 1 %
HCT: 41.1 % (ref 39.0–52.0)
Hemoglobin: 14.1 g/dL (ref 13.0–17.0)
Immature Granulocytes: 0 %
Lymphocytes Relative: 37 %
Lymphs Abs: 2.2 10*3/uL (ref 0.7–4.0)
MCH: 30.8 pg (ref 26.0–34.0)
MCHC: 34.3 g/dL (ref 30.0–36.0)
MCV: 89.7 fL (ref 80.0–100.0)
Monocytes Absolute: 0.5 10*3/uL (ref 0.1–1.0)
Monocytes Relative: 8 %
Neutro Abs: 3.2 10*3/uL (ref 1.7–7.7)
Neutrophils Relative %: 53 %
Platelets: 185 10*3/uL (ref 150–400)
RBC: 4.58 MIL/uL (ref 4.22–5.81)
RDW: 12.3 % (ref 11.5–15.5)
WBC: 6 10*3/uL (ref 4.0–10.5)
nRBC: 0 % (ref 0.0–0.2)

## 2023-01-03 MED ORDER — HEPARIN SOD (PORK) LOCK FLUSH 100 UNIT/ML IV SOLN
500.0000 [IU] | Freq: Once | INTRAVENOUS | Status: AC | PRN
  Administered 2023-01-03: 500 [IU]

## 2023-01-03 MED ORDER — SODIUM CHLORIDE 0.9% FLUSH
10.0000 mL | INTRAVENOUS | Status: DC | PRN
  Administered 2023-01-03: 10 mL

## 2023-01-03 NOTE — Progress Notes (Signed)
[facility] Cancer Follow up Visit:  Patient Care Team: Nodal, Joline Salt, PA-C as PCP - General (Physician Assistant)  CHIEF COMPLAINTS/PURPOSE OF CONSULTATION:   HISTORY OF PRESENTING ILLNESS: Edwin Hall 57 y.o. male is here because of easy bruising.  Medical history notable for CAD, GERD, Barrett Esophagus, recurrent hiatal hernia  Patient has a complex surgical history Open appendectomy, open cholecystectomy in the 80s/90s.  Open Nissen fundoplication around 1997 in [place].   Recurrent hiatal hernia repair with re-do fundoplication in the 2000s via the chest?(?thoracoscopic PEH repair) followed by another one via abdominal approach Open ventral hernia repair in the 2000s with mesh placement Open pyloroplasty with PGJ placement by Dr. Jossie Ng in 2017. Recurrent hiatal hernia with colon herniating through the hiatus and had left thoracotomy, extensive lysis of pleural adhesions, primary diaphragmatic repair and partial 7th rib resection in 2021 Pneumothorax in July 2021 following left thoracotomy for hiatal hernia repair in June 2021.   Developed progressive dysphagia and heartburn requiring EGD/dilation.  EGD 08/04/21 - long segment Barrett's, ?loose [place], evidence of pyloroplasty Manometry 05/01/2021 - IR 4.4, DCI 1598, 100% intact swallow. ? 3.4 cm hiatal hernia. Barium swallow 02/09/21 - spontaneous GERD, tablet did not pass into stomach, small hiatal hernia. Esophageal dysmotility  Ivor Lewis Esophagectomy in January 2023 course complicated by septic shock and respiratory failure.  Hospitalized for 3 months and discharged to rehab  April 30, 2022: WBC 7.9 hemoglobin 15.6 platelet count 215; 67 segs 24 lymphs 7 monos 1 EO 1 basophil  July 02 2022:  CT chest.  Post esophagectomy with gastric pull-through without evidence of complication. No evidence of volvulus or obstruction. Subtle patchy and bandlike peribronchovascular groundglass opacity in the upper lobes  and right middle lobe compatible with post diffuse alveolar damage recovery.  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 Recommend correlation for subclavian steal  August 06, 2022 B12 greater than 1500 Ferritin 64  September 01 2022:  CT AP.  No acute abnormality in the abdomen or pelvis.  Again noted is a hiatal hernia with a portion of the transverse  colon extending into the lower chest. No evidence for bowel  obstruction or inflammatory changes.  Postsurgical changes in the left lower lobe with elevation of the left hemidiaphragm.  Aortic Atherosclerosis   September 15 2022:  [facility] Hematology Consult Reviewed surgical history with patient.   During the past month noted that he has developed increased bruising such that it occurs on his thighs when he crosses his legs.  He has bruising on forearms.  Developed a bruise on his heel the other day and can not recall any particular trauma.  Began taking ASA 81 mg daily a few years ago which he is still taking.  Takes lyrica PRN for pain. Not taking any other NSAIDS.  He is not aware of having any bleeding complications with any of the multiple surgeries he has undergone.  Received PRBC's in the past.  Has not received IV iron but is taking oral iron.  No epistaxis, gingival bleeding, no hematuria.  No hematochezia, or melena.  No joint or deep muscle bleeding.   Fatigued and has difficulty getting up the steps to his apartment.  Intolerant to cold.  Has developed hair loss since surgery in January   Had colonoscopy > 5 yrs ago which per patient was negative.    Social:  Single.  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  No tobacco or EtOH  [facility] Mother alive 78 dementia Father died 66's heart problems Brother alive 52 multiple medical problems Sister alive 22 well  WBC 8.8 hemoglobin 15.4 MCV 103 platelet count  222; 72 segs 17 lymphs 7 monos 1 EO 1 basophil INR 0.9 PTT 24 fibrinogen 557 Factor VIII activity 301 von Willebrand factor antigen 478 von Willebrand factor activity 338 Vitamin E (alpha) 12.4 vitamin E (Gamma) 0.6 (lower limit normal 0.5) Vitamin A 49.6 Vitamin B1 96.6 Vitamin B6 19.3 Vitamin B12 4981 folate 30 ferritin 117 Vitamin D, 25-hydroxy 54.67 Phosphorus 2.5 (LLN 2.5)  Copper and zinc levels not available  September 22 2022:  Reviewed results of labs with patient.  He is not sleeping well.  Appetite still poor.  Anorectic.  Fatigued.  Can't get comfortable to sleep.  Still bruising.  Takes ASA 81 mg daily.   DEXA scan not performed yet Has lost 4 lbs.    November 12, 2022: WBC 6.4 hemoglobin 16.1 MCV 92 platelet count 202  November 26, 2022: CT chest abdomen pelvis.   No acute intrathoracic, intra-abdominal, intrapelvic abnormality. Query status post partial gastric pull-through surgical changes with persistently elevated left hemidiaphragm. Question associated chronic herniation of the mid transverse colon along the gastric pull-through. No findings to suggest ischemia or associated bowel obstruction.  Likely status post left lower pulmonary lobe wedge resection.  Status post cholecystectomy. WBC 9.6 hemoglobin 17.1 platelet count 246 CMP notable for potassium 3.2 UA 1.041/20 ketones/hazy  January 03 2023:  Scheduled follow up regarding increased bruising and malabsorption.  Has lost 3 lbs.  Has been to the ED several times since last visit.  These visits have been prompted by episodes of nausea, emesis, dehydration and abdominal pain.    Review of Systems  Constitutional:  Positive for fatigue and unexpected weight change. Negative for fever.       Has to make himself eat.  Has lost 23 lbs since surgery in January 2023  HENT:   Positive for trouble swallowing and voice change. Negative for mouth sores, nosebleeds and sore throat.        Voice a lot higher now.    Eyes:   Negative for eye problems and icterus.       Vision changes:  None  Respiratory:  Positive for cough. Negative for chest tightness, hemoptysis, shortness of breath and wheezing.        PND:  none Orthopnea:  none DOE:    Cardiovascular:  Negative for chest pain, leg swelling and palpitations.       Swelling in left foot  Gastrointestinal:  Positive for abdominal pain, blood in stool and nausea. Negative for abdominal distention, constipation, diarrhea and vomiting.       Feels tight in cervical phase of swallowing Periumbilical pain after eating despite carafate No hematochezia recently Nausea after eating  Endocrine: Negative for hot flashes.       Cold intolerance:  none Heat intolerance:  none  Genitourinary:  Positive for frequency and nocturia. Negative for bladder incontinence, difficulty urinating, dysuria and hematuria.   Musculoskeletal:  Positive for arthralgias, back pain, gait problem and myalgias. Negative for neck pain and neck stiffness.  Skin:  Negative for itching, rash and wound.  Neurological:  Positive for extremity weakness and gait problem. Negative for dizziness, headaches, light-headedness, numbness and speech difficulty.       Gait problems due to arthralgias in hips  Hematological:  Negative for  adenopathy. Bruises/bleeds easily.  Psychiatric/Behavioral:  Positive for depression and sleep disturbance. Negative for suicidal ideas. The patient is not nervous/anxious.        Sleeps poorly due to arthralgias and myalgias    MEDICAL HISTORY: Past Medical History:  Diagnosis Date   Anxiety    Asthma    Barrett's esophagus    Blood transfusion without reported diagnosis    Complication of anesthesia    nasuea and vomiting   Food allergy    Headache    Hyperlipidemia    IBS (irritable bowel syndrome)    Iron deficiency anemia    Urticaria     SURGICAL HISTORY: Past Surgical History:  Procedure Laterality Date   ABDOMINAL SURGERY  12/2015   pylorus  plasty   APPENDECTOMY  1984   BIOPSY  05/18/2020   Procedure: BIOPSY;  Surgeon: Kerin Salen, MD;  Location: [facility];  Service: Gastroenterology;;   CHOLECYSTECTOMY  2008   ESOPHAGOGASTRODUODENOSCOPY (EGD) WITH PROPOFOL N/A 05/18/2020   Procedure: ESOPHAGOGASTRODUODENOSCOPY (EGD) WITH PROPOFOL;  Surgeon: Kerin Salen, MD;  Location: [facility];  Service: Gastroenterology;  Laterality: N/A;   GASTROSTOMY TUBE PLACEMENT     HIATAL HERNIA REPAIR  07/2015   PARTIAL ESOPHAGECTOMY  12/10/2021   TALC PLEURODESIS     TONSILLECTOMY  1980   VENTRAL HERNIA REPAIR  2010    SOCIAL HISTORY: Social History   Socioeconomic History   Marital status: Single    Spouse name: Not on file   Number of children: Not on file   Years of education: Not on file   Highest education level: Not on file  Occupational History   Not on file  Tobacco Use   Smoking status: Never   Smokeless tobacco: Never  Vaping Use   Vaping Use: Never used  Substance and Sexual Activity   Alcohol use: No   Drug use: No   Sexual activity: Not Currently  Other Topics Concern   Not on file  Social History Narrative   Not on file   Social Determinants of Health   Financial Resource Strain: Not on file  Food Insecurity: No Food Insecurity (10/13/2020)   Hunger Vital Sign    Worried About Running Out of Food in the Last Year: Never true    Ran Out of Food in the Last Year: Never true  Transportation Needs: No Transportation Needs (10/13/2020)   PRAPARE - Administrator, Civil Service (Medical): No    Lack of Transportation (Non-Medical): No  Physical Activity: Not on file  Stress: Not on file  Social Connections: Not on file  Intimate Partner Violence: Not on file    FAMILY HISTORY Family History  Problem Relation Age of Onset   Parkinson's disease Mother    Angioedema Mother    Dementia Mother    Heart disease Father    Lung cancer Brother 7       non-smoker   Allergic rhinitis Neg Hx     Asthma Neg Hx    Eczema Neg Hx    Immunodeficiency Neg Hx    Urticaria Neg Hx     ALLERGIES:  is allergic to mushroom extract complex, other, shellfish allergy, reglan [metoclopramide], zofran [ondansetron hcl], and tetracyclines & related.  MEDICATIONS:  Current Outpatient Medications  Medication Sig Dispense Refill   acetaminophen (TYLENOL) 500 MG tablet Take 1,000 mg by mouth every 6 (six) hours as needed for moderate pain.     alum & mag hydroxide-simeth (MAALOX MAX) 400-400-40 MG/5ML  suspension Take 10 mLs by mouth every 6 (six) hours as needed for indigestion. 355 mL 0   aspirin 81 MG EC tablet Take 81 mg by mouth at bedtime. Swallow whole.     chlorpheniramine-HYDROcodone (TUSSIONEX) 10-8 MG/5ML Take 5 mLs by mouth every 12 (twelve) hours as needed for cough.     Cholecalciferol (VITAMIN D3) 10 MCG (400 UNIT) CAPS Take 400 Units by mouth daily.     Cyanocobalamin (VITAMIN B 12 PO) Take 1,000 mcg by mouth daily.     cyclobenzaprine (FLEXERIL) 10 MG tablet Take 10 mg by mouth 3 (three) times daily as needed for muscle spasms.     famotidine (PEPCID) 40 MG tablet Take 40 mg by mouth 2 (two) times daily.     Ferrous Sulfate (IRON) 325 (65 Fe) MG TABS Take 325 mg by mouth every other day.      ipratropium (ATROVENT) 0.06 % nasal spray USE TWO SPRAYS IN EACH NOSTRIL TWICE DAILY AS DIRECTED. (Patient taking differently: Place 1 spray into both nostrils daily as needed for rhinitis.) 15 mL 0   lidocaine (XYLOCAINE) 2 % solution Use as directed 10 mLs in the mouth or throat every 6 (six) hours as needed (stomach pain). 100 mL 0   Magnesium 250 MG TABS Take 250 mg by mouth daily.     metoCLOPramide (REGLAN) 10 MG tablet Take 10 mg by mouth 2 (two) times daily before a meal.     montelukast (SINGULAIR) 10 MG tablet Take 10 mg by mouth daily as needed (allergies).     Multiple Vitamin (MULTIVITAMIN WITH MINERALS) TABS tablet Take 1 tablet by mouth daily.     pantoprazole (PROTONIX) 40 MG tablet  Take 40 mg by mouth 2 (two) times daily.     PROAIR DIGIHALER 108 (90 Base) MCG/ACT AEPB Take 2 puffs by mouth every 4 (four) hours as needed (sob/wheezing).     Probiotic Product (PROBIOTIC ADVANCED) CAPS Take 1 capsule by mouth 2 (two) times daily.     promethazine (PHENERGAN) 25 MG suppository Place 1 suppository (25 mg total) rectally every 6 (six) hours as needed for nausea or vomiting. 12 each 0   promethazine (PHENERGAN) 25 MG tablet Take 25 mg by mouth every 6 (six) hours as needed for nausea or vomiting.     rosuvastatin (CRESTOR) 20 MG tablet Take 20 mg by mouth at bedtime.     sucralfate (CARAFATE) 1 g tablet Take 1 tablet (1 g total) by mouth 4 (four) times daily -  with meals and at bedtime. 42 tablet 0   traZODone (DESYREL) 50 MG tablet Take 50 mg by mouth at bedtime.     Zinc 50 MG TABS Take 50 mg by mouth daily.      No current facility-administered medications for this visit.   Facility-Administered Medications Ordered in Other Visits  Medication Dose Route Frequency Provider Last Rate Last Admin   sodium chloride flush (NS) 0.9 % injection 10 mL  10 mL Intracatheter PRN Loni Muse, MD   10 mL at 09/15/22 1258    PHYSICAL EXAMINATION:  ECOG PERFORMANCE STATUS: 1 - Symptomatic but completely ambulatory   Vitals:   01/03/23 1450  BP: (!) 140/95  Pulse: 97  Resp: 14  Temp: 98.2 F (36.8 C)  SpO2: 98%    Filed Weights   01/03/23 1450  Weight: 123 lb 12.8 oz (56.2 kg)     Physical Exam Vitals and nursing note reviewed.  Constitutional:  Appearance: Normal appearance. He is normal weight. He is not diaphoretic.  HENT:     Head: Normocephalic and atraumatic.     Right Ear: External ear normal.     Left Ear: External ear normal.     Nose: Nose normal.  Eyes:     General: No scleral icterus.    Conjunctiva/sclera: Conjunctivae normal.     Pupils: Pupils are equal, round, and reactive to light.  Cardiovascular:     Rate and Rhythm: Normal rate  and regular rhythm.     Heart sounds: Normal heart sounds. No murmur heard.    No friction rub. No gallop.  Pulmonary:     Effort: Pulmonary effort is normal. No respiratory distress.     Breath sounds: Normal breath sounds. No stridor.  Abdominal:     General: Bowel sounds are normal. There is no distension.     Palpations: Abdomen is soft. There is no mass.  Musculoskeletal:        General: No swelling, tenderness, deformity or signs of injury. Normal range of motion.     Cervical back: Normal range of motion and neck supple. No rigidity or tenderness.  Lymphadenopathy:     Head:     Right side of head: No submental, submandibular, tonsillar, preauricular, posterior auricular or occipital adenopathy.     Left side of head: No submental, submandibular, tonsillar, preauricular, posterior auricular or occipital adenopathy.     Cervical: No cervical adenopathy.     Right cervical: No superficial, deep or posterior cervical adenopathy.    Left cervical: No superficial, deep or posterior cervical adenopathy.     Upper Body:     Right upper body: No supraclavicular or axillary adenopathy.     Left upper body: No supraclavicular or axillary adenopathy.     Lower Body: No right inguinal adenopathy. No left inguinal adenopathy.  Skin:    Coloration: Skin is not jaundiced or pale.  Neurological:     General: No focal deficit present.     Mental Status: He is alert and oriented to person, place, and time.  Psychiatric:        Mood and Affect: Mood normal.        Behavior: Behavior normal.        Thought Content: Thought content normal.        Judgment: Judgment normal.      LABORATORY DATA: I have personally reviewed the data as listed:  No visits with results within 1 Month(s) from this visit.  Latest known visit with results is:  Admission on 11/26/2022, Discharged on 11/27/2022  Component Date Value Ref Range Status   WBC 11/26/2022 9.6  4.0 - 10.5 K/uL Final   RBC 11/26/2022  5.45  4.22 - 5.81 MIL/uL Final   Hemoglobin 11/26/2022 17.1 (H)  13.0 - 17.0 g/dL Final   HCT 54/07/8118 50.5  39.0 - 52.0 % Final   MCV 11/26/2022 92.7  80.0 - 100.0 fL Final   MCH 11/26/2022 31.4  26.0 - 34.0 pg Final   MCHC 11/26/2022 33.9  30.0 - 36.0 g/dL Final   RDW 14/78/2956 11.9  11.5 - 15.5 % Final   Platelets 11/26/2022 246  150 - 400 K/uL Final   nRBC 11/26/2022 0.0  0.0 - 0.2 % Final   Performed at [facility], [address], [place], [place] [address]   Troponin I (High Sensitivity) 11/26/2022 9  <18 ng/L Final   Comment: (NOTE) Elevated high sensitivity troponin  I (hsTnI) values and significant  changes across serial measurements may suggest ACS but many other  chronic and acute conditions are known to elevate hsTnI results.  Refer to the "Links" section for chest pain algorithms and additional  guidance. Performed at [facility], [address] [place], [place] [address]    Sodium 11/26/2022 138  135 - 145 mmol/L Final   Potassium 11/26/2022 3.2 (L)  3.5 - 5.1 mmol/L Final   Chloride 11/26/2022 101  98 - 111 mmol/L Final   CO2 11/26/2022 22  22 - 32 mmol/L Final   Glucose, Bld 11/26/2022 117 (H)  70 - 99 mg/dL Final   Glucose reference range applies only to samples taken after fasting for at least 8 hours.   BUN 11/26/2022 11  6 - 20 mg/dL Final   Creatinine, Ser 11/26/2022 0.87  0.61 - 1.24 mg/dL Final   Calcium 08/65/7846 9.0  8.9 - 10.3 mg/dL Final   Total Protein 96/29/5284 6.7  6.5 - 8.1 g/dL Final   Albumin 13/24/4010 3.7  3.5 - 5.0 g/dL Final   AST 27/25/3664 31  15 - 41 U/L Final   ALT 11/26/2022 30  0 - 44 U/L Final   Alkaline Phosphatase 11/26/2022 64  38 - 126 U/L Final   Total Bilirubin 11/26/2022 0.9  0.3 - 1.2 mg/dL Final   GFR, Estimated 11/26/2022 >60  >60 mL/min Final   Comment: (NOTE) Calculated using the CKD-EPI Creatinine Equation (2021)    Anion gap 11/26/2022 15  5 - 15 Final   Performed at [facility], [address], [place], [place] [address]   Lipase 11/26/2022 29  11 - 51 U/L Final   Performed at [facility], [address], [place], [place] [address]   Color, Urine 11/26/2022 STRAW (A)  YELLOW Final   APPearance 11/26/2022 HAZY (A)  CLEAR Final   Specific Gravity, Urine 11/26/2022 1.041 (H)  1.005 - 1.030 Final   pH 11/26/2022 6.0  5.0 - 8.0 Final   Glucose, UA 11/26/2022 NEGATIVE  NEGATIVE mg/dL Final   Hgb urine dipstick 11/26/2022 NEGATIVE  NEGATIVE Final   Bilirubin Urine 11/26/2022 NEGATIVE  NEGATIVE Final   Ketones, ur 11/26/2022 20 (A)  NEGATIVE mg/dL Final   Protein, ur 63/87/5643 NEGATIVE  NEGATIVE mg/dL Final   Nitrite 32/95/1884 NEGATIVE  NEGATIVE Final   Leukocytes,Ua 11/26/2022 NEGATIVE  NEGATIVE Final   Performed at [facility], [address] [place], [place] [address]    RADIOGRAPHIC STUDIES: I have personally reviewed the radiological images as listed and agree with the findings in the report  No results found.  ASSESSMENT/PLAN 58 year old male with CAD who has a complicated history of upper GI surgeries which culminated in a gastrectomy performed in January 2023 complicated by a prolonged postoperative course.    Iron deficiency:  Secondary poor iron absorption but may have ongoing GI blood loss.    Dysmotility  January 03 2023:  By history appears to be involving upper GIT and likely related to multiple upper GI surgeries I contacted Dr. Tora Perches for advice and have referred patient to [facility] center.  Explained to patient that appetite stimulants will not be helpful as they will exacerbate the cycle of eating and purging.    Increased bruising:  This is a new event.   Patient's bleeding history has been benign especially when considering the number of surgical procedures he has undergone. Coagulation testing including  CBC with diff, PT/PTT/ von Willebrand profile was normal.   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   September 15 2022:  Labs indicated that absorption was reasonably intact.     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  Vitamin D level, Calcium, Phosphate adequate.  Refered for DEXA scan.  Consider Rheumatologic evaluation in the future  Fatigue:  Likely multifactorial.  Still recovering from complications of sepsis earlier this year.  Appears to be suffering from depression with disturbances in sleep and appetite.  Might benefit from more intense pharmacologic intervention.      Cancer Staging  No matching staging information was found for the patient.   No problem-specific Assessment & Plan notes found for this encounter.   No orders of the defined types were placed in this encounter.  38  minutes was spent in patient care.  This included time spent preparing to see the patient (e.g., review of tests), obtaining and/or reviewing separately obtained history, counseling and educating the patient, ordering tests, or procedures; documenting clinical information in the electronic or other health record, independently interpreting results and communicating results to the patient as well as coordination of care.       All questions were answered. The patient knows to call the clinic with any problems, questions or concerns.  This note was electronically signed.    Loni Muse, MD  01/03/2023 2:55 PM

## 2023-01-04 ENCOUNTER — Telehealth: Payer: Self-pay

## 2023-01-04 LAB — FERRITIN: Ferritin: 32 ng/mL (ref 24–336)

## 2023-01-04 NOTE — Telephone Encounter (Signed)
Referral has been faxed to [facility].

## 2023-01-05 LAB — SELENIUM SERUM: Selenium: 151 ug/L (ref 93–198)

## 2023-01-06 ENCOUNTER — Encounter: Payer: Self-pay | Admitting: Oncology

## 2023-01-06 DIAGNOSIS — K3189 Other diseases of stomach and duodenum: Secondary | ICD-10-CM | POA: Insufficient documentation

## 2023-01-10 LAB — COPPER, SERUM: Copper: 40 ug/dL — ABNORMAL LOW (ref 69–132)

## 2023-01-10 LAB — ZINC: Zinc: 73 ug/dL (ref 44–115)

## 2023-05-10 ENCOUNTER — Other Ambulatory Visit: Payer: Self-pay

## 2023-05-10 ENCOUNTER — Emergency Department (HOSPITAL_COMMUNITY): Payer: Medicare (Managed Care)

## 2023-05-10 ENCOUNTER — Encounter (HOSPITAL_COMMUNITY): Payer: Self-pay

## 2023-05-10 ENCOUNTER — Emergency Department (HOSPITAL_COMMUNITY)
Admission: EM | Admit: 2023-05-10 | Discharge: 2023-05-10 | Disposition: A | Discharge status: Home / Self Care | Payer: Medicare (Managed Care) | Attending: Emergency Medicine | Admitting: Emergency Medicine

## 2023-05-10 DIAGNOSIS — Z79899 Other long term (current) drug therapy: Secondary | ICD-10-CM | POA: Diagnosis not present

## 2023-05-10 DIAGNOSIS — G43809 Other migraine, not intractable, without status migrainosus: Secondary | ICD-10-CM | POA: Insufficient documentation

## 2023-05-10 DIAGNOSIS — R519 Headache, unspecified: Secondary | ICD-10-CM | POA: Diagnosis present

## 2023-05-10 DIAGNOSIS — Z7982 Long term (current) use of aspirin: Secondary | ICD-10-CM | POA: Insufficient documentation

## 2023-05-10 DIAGNOSIS — I10 Essential (primary) hypertension: Secondary | ICD-10-CM | POA: Diagnosis not present

## 2023-05-10 LAB — CBC WITH DIFFERENTIAL/PLATELET
Abs Immature Granulocytes: 0.03 10*3/uL (ref 0.00–0.07)
Basophils Absolute: 0 10*3/uL (ref 0.0–0.1)
Basophils Relative: 0 %
Eosinophils Absolute: 0 10*3/uL (ref 0.0–0.5)
Eosinophils Relative: 0 %
HCT: 40.6 % (ref 39.0–52.0)
Hemoglobin: 13.9 g/dL (ref 13.0–17.0)
Immature Granulocytes: 1 %
Lymphocytes Relative: 24 %
Lymphs Abs: 1.5 10*3/uL (ref 0.7–4.0)
MCH: 31.7 pg (ref 26.0–34.0)
MCHC: 34.2 g/dL (ref 30.0–36.0)
MCV: 92.7 fL (ref 80.0–100.0)
Monocytes Absolute: 0.5 10*3/uL (ref 0.1–1.0)
Monocytes Relative: 8 %
Neutro Abs: 4.1 10*3/uL (ref 1.7–7.7)
Neutrophils Relative %: 67 %
Platelets: 152 10*3/uL (ref 150–400)
RBC: 4.38 MIL/uL (ref 4.22–5.81)
RDW: 12.8 % (ref 11.5–15.5)
WBC: 6.1 10*3/uL (ref 4.0–10.5)
nRBC: 0 % (ref 0.0–0.2)

## 2023-05-10 LAB — BASIC METABOLIC PANEL
Anion gap: 6 (ref 5–15)
BUN: 21 mg/dL — ABNORMAL HIGH (ref 6–20)
CO2: 23 mmol/L (ref 22–32)
Calcium: 7.4 mg/dL — ABNORMAL LOW (ref 8.9–10.3)
Chloride: 110 mmol/L (ref 98–111)
Creatinine, Ser: 0.68 mg/dL (ref 0.61–1.24)
GFR, Estimated: >60 mL/min (ref >60)
Glucose, Bld: 93 mg/dL (ref 70–99)
Potassium: 3.3 mmol/L — ABNORMAL LOW (ref 3.5–5.1)
Sodium: 139 mmol/L (ref 135–145)

## 2023-05-10 MED ORDER — KETOROLAC TROMETHAMINE 15 MG/ML IJ SOLN
15.0000 mg | Freq: Once | INTRAMUSCULAR | Status: AC
  Administered 2023-05-10: 15 mg via INTRAVENOUS
  Filled 2023-05-10: qty 1

## 2023-05-10 MED ORDER — MAGNESIUM SULFATE IN D5W 1-5 GM/100ML-% IV SOLN
1.0000 g | Freq: Once | INTRAVENOUS | Status: AC
  Administered 2023-05-10: 1 g via INTRAVENOUS
  Filled 2023-05-10: qty 100

## 2023-05-10 MED ORDER — DEXAMETHASONE SODIUM PHOSPHATE 10 MG/ML IJ SOLN
8.0000 mg | Freq: Once | INTRAMUSCULAR | Status: AC
  Administered 2023-05-10: 8 mg via INTRAVENOUS
  Filled 2023-05-10: qty 1

## 2023-05-10 MED ORDER — HALOPERIDOL LACTATE 5 MG/ML IJ SOLN
5.0000 mg | Freq: Once | INTRAMUSCULAR | Status: AC
  Administered 2023-05-10: 5 mg via INTRAVENOUS
  Filled 2023-05-10: qty 1

## 2023-05-10 MED ORDER — MAGNESIUM SULFATE 50 % IJ SOLN: 1.0000 g | Freq: Once | INTRAMUSCULAR | Status: DC

## 2023-05-10 MED ORDER — POTASSIUM CHLORIDE CRYS ER 20 MEQ PO TBCR
40.0000 meq | EXTENDED_RELEASE_TABLET | Freq: Once | ORAL | Status: AC
  Administered 2023-05-10: 40 meq via ORAL
  Filled 2023-05-10: qty 2

## 2023-05-10 MED ORDER — HEPARIN SOD (PORK) LOCK FLUSH 100 UNIT/ML IV SOLN
INTRAVENOUS | Status: AC
  Filled 2023-05-10: qty 5

## 2023-05-10 MED ORDER — DIPHENHYDRAMINE HCL 50 MG/ML IJ SOLN
50.0000 mg | Freq: Once | INTRAMUSCULAR | Status: AC
  Administered 2023-05-10: 50 mg via INTRAVENOUS
  Filled 2023-05-10: qty 1

## 2023-05-10 MED ORDER — SODIUM CHLORIDE 0.9 % IV SOLN
12.5000 mg | Freq: Four times a day (QID) | INTRAVENOUS | Status: DC | PRN
  Administered 2023-05-10: 12.5 mg via INTRAVENOUS
  Filled 2023-05-10: qty 12.5
  Filled 2023-05-10: qty 0.5

## 2023-05-10 MED ORDER — CALCIUM GLUCONATE-NACL 1-0.675 GM/50ML-% IV SOLN
1.0000 g | Freq: Once | INTRAVENOUS | Status: AC
  Administered 2023-05-10: 1000 mg via INTRAVENOUS
  Filled 2023-05-10: qty 50

## 2023-05-10 MED ORDER — SODIUM CHLORIDE 0.9 % IV BOLUS
1000.0000 mL | Freq: Once | INTRAVENOUS | Status: AC
  Administered 2023-05-10: 1000 mL via INTRAVENOUS

## 2023-05-10 MED ORDER — FENTANYL CITRATE PF 50 MCG/ML IJ SOSY
50.0000 ug | PREFILLED_SYRINGE | Freq: Once | INTRAMUSCULAR | Status: AC
  Administered 2023-05-10: 50 ug via INTRAVENOUS
  Filled 2023-05-10: qty 1

## 2023-05-10 NOTE — Discharge Instructions (Signed)
Return to the ED with any new or worsening signs or symptoms Follow-up with neurology/PCP for outpatient MRI of brain with contrast Please continue taking abortive medications for migraines as needed

## 2023-05-10 NOTE — ED Triage Notes (Signed)
Patient reports migraine x4 days, endorses light sensitivity and vomiting. Has migraines in the past and tried different meds before with no relief.

## 2023-05-10 NOTE — ED Provider Notes (Signed)
[place] EMERGENCY DEPARTMENT AT [facility] Provider Note   CSN: [id number] Arrival date & time: 05/10/23  0046     History  Chief Complaint  Patient presents with   Migraine    Edwin Hall is a 57 y.o. male.  The history is provided by the patient and medical records. No language interpreter was used.  Migraine     57 year old male with history of recurrent migraine headache, IBS, hypertension, failure to thrive, presenting with complaint of headache.  Patient report gradual onset of progressive worsening migraine headache ongoing for the past 4 days.  Describes headache as a throbbing sensation across his forehead with light and sound sensitivity.  He also endorsed nausea, unable to keep anything down with associated vomiting.  Endorsing chest discomfort, having some chills and states this felt similar to prior headaches that he has had in the past.  He mention he has tried numerous medications recently without relief.  Medication including Toradol, triptans, and over-the-counter medication.  He mentioned headaches started shortly after he had sepsis and was hospitalized for an extended period of time during last year.  Home Medications Prior to Admission medications   Medication Sig Start Date End Date Taking? Authorizing Provider  acetaminophen (TYLENOL) 500 MG tablet Take 1,000 mg by mouth every 6 (six) hours as needed for moderate pain.    [provider]  alum & mag hydroxide-simeth (MAALOX MAX) 400-400-40 MG/5ML suspension Take 10 mLs by mouth every 6 (six) hours as needed for indigestion. 11/27/22   Nira Conn, MD  aspirin 81 MG EC tablet Take 81 mg by mouth at bedtime. Swallow whole.    [provider]  chlorpheniramine-HYDROcodone (TUSSIONEX) 10-8 MG/5ML Take 5 mLs by mouth every 12 (twelve) hours as needed for cough.    [provider]  Cholecalciferol (VITAMIN D3) 10 MCG (400 UNIT) CAPS Take 400 Units by mouth daily.     [provider]  Cyanocobalamin (VITAMIN B 12 PO) Take 1,000 mcg by mouth daily.    [provider]  cyclobenzaprine (FLEXERIL) 10 MG tablet Take 10 mg by mouth 3 (three) times daily as needed for muscle spasms. 07/05/22   [provider]  famotidine (PEPCID) 40 MG tablet Take 40 mg by mouth 2 (two) times daily. 06/21/22   [provider]  Ferrous Sulfate (IRON) 325 (65 Fe) MG TABS Take 325 mg by mouth every other day.  05/12/20   [provider]  ipratropium (ATROVENT) 0.06 % nasal spray USE TWO SPRAYS IN EACH NOSTRIL TWICE DAILY AS DIRECTED. Patient taking differently: Place 1 spray into both nostrils daily as needed for rhinitis. 05/22/20   Marcelyn Bruins, MD  lidocaine (XYLOCAINE) 2 % solution Use as directed 10 mLs in the mouth or throat every 6 (six) hours as needed (stomach pain). 11/27/22   Nira Conn, MD  Magnesium 250 MG TABS Take 250 mg by mouth daily.    [provider]  metoCLOPramide (REGLAN) 10 MG tablet Take 10 mg by mouth 2 (two) times daily before a meal. 06/30/22   [provider]  montelukast (SINGULAIR) 10 MG tablet Take 10 mg by mouth daily as needed (allergies). 12/11/21   [provider]  Multiple Vitamin (MULTIVITAMIN WITH MINERALS) TABS tablet Take 1 tablet by mouth daily.    [provider]  pantoprazole (PROTONIX) 40 MG tablet Take 40 mg by mouth 2 (two) times daily.    [provider]  [facility] 108 (  90 Base) MCG/ACT AEPB Take 2 puffs by mouth every 4 (four) hours as needed (sob/wheezing). 09/22/21   [provider]  Probiotic Product (PROBIOTIC ADVANCED) CAPS Take 1 capsule by mouth 2 (two) times daily.    [provider]  promethazine (PHENERGAN) 25 MG suppository Place 1 suppository (25 mg total) rectally every 6 (six) hours as needed for nausea or vomiting. 11/27/22   Nira Conn, MD  promethazine (PHENERGAN) 25 MG tablet Take 25  mg by mouth every 6 (six) hours as needed for nausea or vomiting.    [provider]  rosuvastatin (CRESTOR) 20 MG tablet Take 20 mg by mouth at bedtime.    [provider]  sucralfate (CARAFATE) 1 g tablet Take 1 tablet (1 g total) by mouth 4 (four) times daily -  with meals and at bedtime. 10/17/20   Tilden Fossa, MD  traZODone (DESYREL) 50 MG tablet Take 50 mg by mouth at bedtime.    [provider]  Zinc 50 MG TABS Take 50 mg by mouth daily.     [provider]  azelastine (ASTELIN) 0.1 % nasal spray 2 sprays per nostril twice a day for control of nasal drainage. Patient not taking: Reported on 05/02/2020 11/27/19 05/02/20  Marcelyn Bruins, MD      Allergies    Mushroom extract complex, Other, Shellfish allergy, Reglan [metoclopramide], Zofran [ondansetron hcl], and Tetracyclines & related    Review of Systems   Review of Systems  All other systems reviewed and are negative.   Physical Exam Updated Vital Signs BP (!) 145/106 (BP Location: Right Arm)   Pulse 95   Temp 97.9 F (36.6 C) (Oral)   Resp 18   Ht 5\' 8"  (1.727 m)   Wt 57.2 kg   SpO2 100%   BMI 19.16 kg/m  Physical Exam Vitals and nursing note reviewed.  Constitutional:      General: He is not in acute distress.    Appearance: He is well-developed.  HENT:     Head: Atraumatic.  Eyes:     Extraocular Movements: Extraocular movements intact.     Conjunctiva/sclera: Conjunctivae normal.     Pupils: Pupils are equal, round, and reactive to light.  Cardiovascular:     Rate and Rhythm: Normal rate and regular rhythm.     Pulses: Normal pulses.     Heart sounds: Normal heart sounds.  Pulmonary:     Effort: Pulmonary effort is normal.     Breath sounds: Normal breath sounds.  Abdominal:     Palpations: Abdomen is soft.  Musculoskeletal:     Cervical back: Neck supple.  Skin:    Findings: No rash.  Neurological:     Mental Status: He is alert.     GCS: GCS eye  subscore is 4. GCS verbal subscore is 5. GCS motor subscore is 6.     Cranial Nerves: Cranial nerves 2-12 are intact.     Sensory: Sensation is intact.     Motor: Motor function is intact.     ED Results / Procedures / Treatments   Labs (all labs ordered are listed, but only abnormal results are displayed) Labs Reviewed  CBC WITH DIFFERENTIAL/PLATELET  BASIC METABOLIC PANEL    EKG EKG Interpretation  Date/Time:  Tuesday May 10 2023 04:54:51 EDT Ventricular Rate:  90 PR Interval:  166 QRS Duration: 85 QT Interval:  382 QTC Calculation: 468 R Axis:   -2 Text Interpretation: Sinus rhythm Biatrial enlargement Abnormal R-wave  progression, early transition Inferior infarct, old Confirmed by Tilden Fossa [phone]) on 05/10/2023 5:49:28 AM  Radiology No results found.  Procedures Procedures    Medications Ordered in ED Medications  promethazine (PHENERGAN) 12.5 mg in sodium chloride 0.9 % 50 mL IVPB (12.5 mg Intravenous New Bag/Given 05/10/23 0536)  magnesium sulfate (IV Push/IM) injection 1 g (has no administration in time range)  ketorolac (TORADOL) 15 MG/ML injection 15 mg (15 mg Intravenous Given 05/10/23 0525)  diphenhydrAMINE (BENADRYL) injection 50 mg (50 mg Intravenous Given 05/10/23 0525)  sodium chloride 0.9 % bolus 1,000 mL (1,000 mLs Intravenous New Bag/Given 05/10/23 0524)  haloperidol lactate (HALDOL) injection 5 mg (5 mg Intravenous Given 05/10/23 0558)    ED Course/ Medical Decision Making/ A&P Clinical Course as of 05/10/23 0648  Tue May 10, 2023  1191 Known migraine history. Gradual onset, typical migraine, 4 days. Nausea. Got triptan, toradol. No relief. Appears well. No NR, fever, photophobia. No indication to scan per previous provider.  [CG]    Clinical Course User Index [CG] Al Decant, PA-C                             Medical Decision Making Amount and/or Complexity of Data Reviewed ECG/medicine tests: ordered.  Risk Prescription drug  management.   BP (!) 145/106 (BP Location: Right Arm)   Pulse 95   Temp 97.9 F (36.6 C) (Oral)   Resp 18   Ht 5\' 8"  (1.727 m)   Wt 57.2 kg   SpO2 100%   BMI 19.16 kg/m   28:51 AM  57 year old male with history of recurrent migraine headache, IBS, hypertension, failure to thrive, presenting with complaint of headache.  Patient report gradual onset of progressive worsening migraine headache ongoing for the past 4 days.  Describes headache as a throbbing sensation across his forehead with light and sound sensitivity.  He also endorsed nausea, unable to keep anything down with associated vomiting.  Endorsing chest discomfort, having some chills and states this felt similar to prior headaches that he has had in the past.  He mention he has tried numerous medications recently without relief.  Medication including Toradol, triptans, and over-the-counter medication.  He mentioned headaches started shortly after he had sepsis and was hospitalized for an extended period of time during last year.  On exam patient is in bed holding an emesis bag but appears to be in no acute respiratory discomfort.  Does not have any nuchal rigidity concerning for meningitis.  He does not have any focal neurodeficit concerning for stroke.  No sudden onset thunderclap headache concerning for subarachnoid hemorrhage.  No red flags.  Plan to provide symptomatic treatment.  6:23 AM Due to his ongoing migraine headache.  Patient received Haldol, Phenergan, Toradol, Benadryl, and IV fluid.  He reports improvement of symptoms but not complete resolved.  Will add magnesium and will continue to monitor.  Head CT scan considered but not performed as this is his recurrent headache and there is no report of any kind of head trauma.  Additional lab work considered but not performed.  6:48 AM Patient signed out to oncoming provider who will continue to monitor patient and will reassess to determine disposition.  I have ordered a BMP  and CBC which have not resulted yet.        Final Clinical Impression(s) / ED Diagnoses Final diagnoses:  None    Rx / DC Orders ED Discharge Orders  None         Fayrene Helper, PA-C 05/10/23 1610    Tilden Fossa, MD 05/10/23 2255

## 2023-05-10 NOTE — ED Provider Notes (Signed)
Physical Exam  BP (!) 140/95 (BP Location: Left Arm)   Pulse 80   Temp 97.9 F (36.6 C) (Oral)   Resp 17   Ht 5\' 8"  (1.727 m)   Wt 57.2 kg   SpO2 100%   BMI 19.16 kg/m   Physical Exam Vitals and nursing note reviewed.  Constitutional:      General: He is not in acute distress.    Appearance: He is well-developed.  HENT:     Head: Normocephalic and atraumatic.  Eyes:     Conjunctiva/sclera: Conjunctivae normal.  Cardiovascular:     Rate and Rhythm: Normal rate and regular rhythm.     Heart sounds: No murmur heard. Pulmonary:     Effort: Pulmonary effort is normal. No respiratory distress.     Breath sounds: Normal breath sounds.  Abdominal:     Palpations: Abdomen is soft.     Tenderness: There is no abdominal tenderness.  Musculoskeletal:        General: No swelling.     Cervical back: Neck supple.  Skin:    General: Skin is warm and dry.     Capillary Refill: Capillary refill takes less than 2 seconds.  Neurological:     General: No focal deficit present.     Mental Status: He is alert.     GCS: GCS eye subscore is 4. GCS verbal subscore is 5. GCS motor subscore is 6.     Cranial Nerves: Cranial nerves 2-12 are intact. No cranial nerve deficit.     Sensory: Sensation is intact. No sensory deficit.     Motor: Motor function is intact. No weakness.     Coordination: Coordination is intact. Heel to [facility] Test normal.  Psychiatric:        Mood and Affect: Mood normal.     Procedures  Procedures  ED Course / MDM   Clinical Course as of 05/10/23 0927  Tue May 10, 2023  9604 Known migraine history. Gradual onset, typical migraine, 4 days. Nausea. Got triptan, toradol. No relief. Appears well. No NR, fever, photophobia. No indication to scan per previous provider.  [CG]    Clinical Course User Index [CG] Al Decant, PA-C   Medical Decision Making Amount and/or Complexity of Data Reviewed Labs: ordered. Radiology: ordered. ECG/medicine tests:  ordered.  Risk Prescription drug management.   Patient signed out to me at shift change pending reassessment and IV mag sulfate.  Please see previous provider note for further details.   In short, this is a 57 year old male with a known history of migraines presents with a 4-day gradually increasing migraine consistent with past episodes.  He has taken his triptan and Toradol with no relief.  Prior to signout, the patient had received Toradol, Benadryl, Haldol, 1 L of fluid, mag sulfate.  Patient reports no decrease in migraine after these were administered.  Patient reports that this migraine is very different than past episodes of migraines so we will CT scan the patient's head.  Will provide him calcium gluconate as he is hypocalcemic on lab work.  Will also provide 8 mg Decadron due to recurrence of headaches.  Update: Patient CT scan of head shows a patchy hypodensity in the corona radiata which is progressed since scan in 2022.  Radiology recommending nonemergent outpatient MRI with contrast of brain.  Patient reports he is already scheduled for this imaging study with his neurologist.  Patient complaining of increased pain so provide 50 mcg of fentanyl and reassess.  On reassessment, the patient reports his headache is markedly decreased at this time.  At this time, the patient appears stable for discharge.  He will follow-up with his neurologist.  He has been advised to return to the ED with any new or worsening signs or symptoms and he voiced understanding.  All questions answered to satisfaction.  Stable to discharge.      Al Decant, PA-C 05/10/23 2536    Margarita Grizzle, MD 05/11/23 1159

## 2023-05-16 ENCOUNTER — Other Ambulatory Visit: Payer: Self-pay

## 2023-05-16 ENCOUNTER — Emergency Department (HOSPITAL_COMMUNITY)
Admission: EM | Admit: 2023-05-16 | Discharge: 2023-05-16 | Disposition: A | Discharge status: Home / Self Care | Payer: Medicare (Managed Care) | Source: Home / Self Care | Attending: Emergency Medicine | Admitting: Emergency Medicine

## 2023-05-16 ENCOUNTER — Emergency Department (HOSPITAL_COMMUNITY): Payer: Medicare (Managed Care)

## 2023-05-16 ENCOUNTER — Encounter (HOSPITAL_COMMUNITY): Payer: Self-pay | Admitting: Emergency Medicine

## 2023-05-16 DIAGNOSIS — I771 Stricture of artery: Secondary | ICD-10-CM

## 2023-05-16 DIAGNOSIS — G43819 Other migraine, intractable, without status migrainosus: Secondary | ICD-10-CM

## 2023-05-16 DIAGNOSIS — G43E11 Chronic migraine with aura, intractable, with status migrainosus: Secondary | ICD-10-CM | POA: Diagnosis not present

## 2023-05-16 LAB — I-STAT CHEM 8, ED
BUN: 18 mg/dL (ref 6–20)
Calcium, Ion: 1.13 mmol/L — ABNORMAL LOW (ref 1.15–1.40)
Chloride: 103 mmol/L (ref 98–111)
Creatinine, Ser: 0.7 mg/dL (ref 0.61–1.24)
Glucose, Bld: 117 mg/dL — ABNORMAL HIGH (ref 70–99)
HCT: 43 % (ref 39.0–52.0)
Hemoglobin: 14.6 g/dL (ref 13.0–17.0)
Potassium: 3.6 mmol/L (ref 3.5–5.1)
Sodium: 137 mmol/L (ref 135–145)
TCO2: 26 mmol/L (ref 22–32)

## 2023-05-16 MED ORDER — VALPROATE SODIUM 100 MG/ML IV SOLN
500.0000 mg | Freq: Once | INTRAVENOUS | Status: AC
  Administered 2023-05-16: 500 mg via INTRAVENOUS
  Filled 2023-05-16: qty 5

## 2023-05-16 MED ORDER — HEPARIN SOD (PORK) LOCK FLUSH 100 UNIT/ML IV SOLN
500.0000 [IU] | Freq: Once | INTRAVENOUS | Status: AC
  Administered 2023-05-16: 500 [IU]
  Filled 2023-05-16: qty 5

## 2023-05-16 MED ORDER — MAGNESIUM SULFATE 2 GM/50ML IV SOLN
2.0000 g | INTRAVENOUS | Status: AC
  Administered 2023-05-16: 2 g via INTRAVENOUS
  Filled 2023-05-16: qty 50

## 2023-05-16 MED ORDER — PROCHLORPERAZINE EDISYLATE 10 MG/2ML IJ SOLN
10.0000 mg | Freq: Once | INTRAMUSCULAR | Status: AC
  Administered 2023-05-16: 10 mg via INTRAVENOUS
  Filled 2023-05-16: qty 2

## 2023-05-16 MED ORDER — HALOPERIDOL LACTATE 5 MG/ML IJ SOLN
2.0000 mg | Freq: Once | INTRAMUSCULAR | Status: AC
  Administered 2023-05-16: 2 mg via INTRAMUSCULAR
  Filled 2023-05-16: qty 1

## 2023-05-16 MED ORDER — IOHEXOL 350 MG/ML SOLN
75.0000 mL | Freq: Once | INTRAVENOUS | Status: AC | PRN
  Administered 2023-05-16: 75 mL via INTRAVENOUS

## 2023-05-16 MED ORDER — KETOROLAC TROMETHAMINE 30 MG/ML IJ SOLN
30.0000 mg | Freq: Once | INTRAMUSCULAR | Status: AC
  Administered 2023-05-16: 30 mg via INTRAVENOUS
  Filled 2023-05-16: qty 1

## 2023-05-16 MED ORDER — DEXAMETHASONE SODIUM PHOSPHATE 10 MG/ML IJ SOLN
10.0000 mg | Freq: Once | INTRAMUSCULAR | Status: AC
  Administered 2023-05-16: 10 mg via INTRAVENOUS
  Filled 2023-05-16: qty 1

## 2023-05-16 MED ORDER — DIPHENHYDRAMINE HCL 50 MG/ML IJ SOLN
25.0000 mg | Freq: Once | INTRAMUSCULAR | Status: AC
  Administered 2023-05-16: 25 mg via INTRAVENOUS
  Filled 2023-05-16: qty 1

## 2023-05-16 MED ORDER — HYDROMORPHONE HCL 1 MG/ML IJ SOLN
0.5000 mg | Freq: Once | INTRAMUSCULAR | Status: AC
  Administered 2023-05-16: 0.5 mg via INTRAVENOUS
  Filled 2023-05-16: qty 1

## 2023-05-16 NOTE — Discharge Instructions (Addendum)
The contrasted CT scan of your brain looked good.    I recommend that you call your neurologist today and see if you can be seen any sooner than 2 weeks.  You had and incidental finding of your subclavian artery being 40% stenosed.  Please discuss this with your primary care doctor.

## 2023-05-16 NOTE — ED Triage Notes (Signed)
Presents for migraine for 10 days. Was treated here last week and has not resolved. HA is frontal and bilateral temples, "feels like a hammer". Endorses N/V (x4).   Pt has an implanted port.

## 2023-05-16 NOTE — ED Provider Notes (Signed)
[facility]-EMERGENCY DEPT [facility] Emergency Department Provider Note MRN:  [id number]  Arrival date & time: 05/16/23     Chief Complaint   No chief complaint on file.   History of Present Illness   Edwin Hall is a 57 y.o. year-old male presents to the ED with chief complaint of headache.  History of the same.  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  He has tried several different abortive meds.  He follows with neurology.  His next treatment step is for botox.  He hasn't received much benefit from treatment in the ED in the past.  History provided by patient.   Review of Systems  Pertinent positive and negative review of systems noted in HPI.    Physical Exam   Vitals:   05/16/23 0148 05/16/23 0500  BP: (!) 164/110 124/89  Pulse: (!) 110 79  Resp: 16 16  Temp: 98.9 F (37.2 C)   SpO2: 100% 100%    CONSTITUTIONAL:  well-appearing, NAD NEURO:  Alert and oriented x 3, CN 3-12 grossly intact EYES:  eyes equal and reactive ENT/NECK:  Supple, no stridor  CARDIO:  tachycardic, regular rhythm, appears well-perfused  PULM:  No respiratory distress,  GI/GU:  non-distended,  MSK/SPINE:  No gross deformities, no edema, moves all extremities  SKIN:  no rash, atraumatic   *Additional and/or pertinent findings included in MDM below  Diagnostic and Interventional Summary    EKG Interpretation Date/Time:    Ventricular Rate:    PR Interval:    QRS Duration:    QT Interval:    QTC Calculation:   R Axis:      Text Interpretation:         Labs Reviewed  I-STAT CHEM 8, ED - Abnormal; Notable for the following components:      Result Value   Glucose, Bld 117 (*)    Calcium, Ion 1.13 (*)    All other components within normal limits    CT ANGIO HEAD NECK W WO CM  Final Result      Medications  magnesium sulfate IVPB 2 g 50 mL (2 g Intravenous New Bag/Given 05/16/23 0442)  valproate (DEPACON) 500 mg in dextrose 5 % 50 mL IVPB (500 mg  Intravenous New Bag/Given 05/16/23 0436)  ketorolac (TORADOL) 30 MG/ML injection 30 mg (30 mg Intravenous Given 05/16/23 0228)  diphenhydrAMINE (BENADRYL) injection 25 mg (25 mg Intravenous Given 05/16/23 0229)  prochlorperazine (COMPAZINE) injection 10 mg (10 mg Intravenous Given 05/16/23 0227)  HYDROmorphone (DILAUDID) injection 0.5 mg (0.5 mg Intravenous Given 05/16/23 0230)  iohexol (OMNIPAQUE) 350 MG/ML injection 75 mL (75 mLs Intravenous Contrast Given 05/16/23 0318)  haloperidol lactate (HALDOL) injection 2 mg (2 mg Intramuscular Given 05/16/23 0439)  dexamethasone (DECADRON) injection 10 mg (10 mg Intravenous Given 05/16/23 0428)     Procedures  /  Critical Care Procedures  ED Course and Medical Decision Making  I have reviewed the triage vital signs, the nursing notes, and pertinent available records from the EMR.  Social Determinants Affecting Complexity of Care: Patient has no clinically significant social determinants affecting this chief complaint..   ED Course:    Medical Decision Making Patient here with headache.  Headache is similar to priors.  He has follow-up with neurology.  I did order a CT angio, which was essentially normal.  Incidental subclavian artery stenosis to 40%.  PCP follow-up for this.  Patient given multiple meds in the ED to help with headache without any success.  Discussed that his next step will be to follow-up with neurology.  He is agreeable with this plan.  Amount and/or Complexity of Data Reviewed Radiology: ordered.  Risk Prescription drug management.         Consultants: No consultations were needed in caring for this patient.   Treatment and Plan: Emergency department workup does not suggest an emergent condition requiring admission or immediate intervention beyond  what has been performed at this time. The patient is safe for discharge and has  been instructed to return immediately for worsening symptoms, change in  symptoms or any other  concerns    Final Clinical Impressions(s) / ED Diagnoses     ICD-10-CM   1. Other migraine without status migrainosus, intractable  G43.819     2. Subclavian artery stenosis (HCC)  I77.1       ED Discharge Orders     None         Discharge Instructions Discussed with and Provided to Patient:     Discharge Instructions      The contrasted CT scan of your brain looked good.    I recommend that you call your neurologist today and see if you can be seen any sooner than 2 weeks.  You had and incidental finding of your subclavian artery being 40% stenosed.  Please discuss this with your primary care doctor.       Roxy Horseman, PA-C 05/16/23 1610    Nicanor Alcon, April, MD 05/16/23 Jeralyn Bennett

## 2023-05-18 ENCOUNTER — Other Ambulatory Visit: Payer: Self-pay

## 2023-05-18 ENCOUNTER — Emergency Department (HOSPITAL_COMMUNITY): Payer: Medicare (Managed Care)

## 2023-05-18 ENCOUNTER — Encounter (HOSPITAL_COMMUNITY): Payer: Self-pay | Admitting: *Deleted

## 2023-05-18 ENCOUNTER — Inpatient Hospital Stay (HOSPITAL_COMMUNITY)
Admission: EM | Admit: 2023-05-18 | Discharge: 2023-05-25 | DRG: 102 | Disposition: A | Discharge status: Home / Self Care | Payer: Medicare (Managed Care) | Attending: Internal Medicine | Admitting: Internal Medicine

## 2023-05-18 DIAGNOSIS — L509 Urticaria, unspecified: Secondary | ICD-10-CM | POA: Diagnosis present

## 2023-05-18 DIAGNOSIS — Z8249 Family history of ischemic heart disease and other diseases of the circulatory system: Secondary | ICD-10-CM | POA: Diagnosis not present

## 2023-05-18 DIAGNOSIS — Z91013 Allergy to seafood: Secondary | ICD-10-CM

## 2023-05-18 DIAGNOSIS — G43A1 Cyclical vomiting, intractable: Secondary | ICD-10-CM | POA: Diagnosis present

## 2023-05-18 DIAGNOSIS — R072 Precordial pain: Secondary | ICD-10-CM | POA: Diagnosis not present

## 2023-05-18 DIAGNOSIS — K2270 Barrett's esophagus without dysplasia: Secondary | ICD-10-CM | POA: Diagnosis present

## 2023-05-18 DIAGNOSIS — Z79899 Other long term (current) drug therapy: Secondary | ICD-10-CM

## 2023-05-18 DIAGNOSIS — E43 Unspecified severe protein-calorie malnutrition: Secondary | ICD-10-CM

## 2023-05-18 DIAGNOSIS — Z7982 Long term (current) use of aspirin: Secondary | ICD-10-CM

## 2023-05-18 DIAGNOSIS — R451 Restlessness and agitation: Secondary | ICD-10-CM | POA: Diagnosis not present

## 2023-05-18 DIAGNOSIS — I10 Essential (primary) hypertension: Secondary | ICD-10-CM | POA: Diagnosis present

## 2023-05-18 DIAGNOSIS — Z8619 Personal history of other infectious and parasitic diseases: Secondary | ICD-10-CM | POA: Diagnosis not present

## 2023-05-18 DIAGNOSIS — G43901 Migraine, unspecified, not intractable, with status migrainosus: Secondary | ICD-10-CM | POA: Diagnosis present

## 2023-05-18 DIAGNOSIS — E785 Hyperlipidemia, unspecified: Secondary | ICD-10-CM | POA: Diagnosis present

## 2023-05-18 DIAGNOSIS — R079 Chest pain, unspecified: Secondary | ICD-10-CM | POA: Diagnosis present

## 2023-05-18 DIAGNOSIS — D509 Iron deficiency anemia, unspecified: Secondary | ICD-10-CM | POA: Diagnosis present

## 2023-05-18 DIAGNOSIS — R443 Hallucinations, unspecified: Secondary | ICD-10-CM | POA: Diagnosis not present

## 2023-05-18 DIAGNOSIS — E871 Hypo-osmolality and hyponatremia: Secondary | ICD-10-CM | POA: Diagnosis not present

## 2023-05-18 DIAGNOSIS — G8929 Other chronic pain: Secondary | ICD-10-CM | POA: Diagnosis present

## 2023-05-18 DIAGNOSIS — K219 Gastro-esophageal reflux disease without esophagitis: Secondary | ICD-10-CM | POA: Diagnosis present

## 2023-05-18 DIAGNOSIS — Z818 Family history of other mental and behavioral disorders: Secondary | ICD-10-CM

## 2023-05-18 DIAGNOSIS — Z9049 Acquired absence of other specified parts of digestive tract: Secondary | ICD-10-CM

## 2023-05-18 DIAGNOSIS — E440 Moderate protein-calorie malnutrition: Secondary | ICD-10-CM | POA: Diagnosis present

## 2023-05-18 DIAGNOSIS — Z820 Family history of epilepsy and other diseases of the nervous system: Secondary | ICD-10-CM

## 2023-05-18 DIAGNOSIS — Z1152 Encounter for screening for COVID-19: Secondary | ICD-10-CM

## 2023-05-18 DIAGNOSIS — Z681 Body mass index (BMI) 19 or less, adult: Secondary | ICD-10-CM

## 2023-05-18 DIAGNOSIS — E162 Hypoglycemia, unspecified: Secondary | ICD-10-CM | POA: Diagnosis present

## 2023-05-18 DIAGNOSIS — R739 Hyperglycemia, unspecified: Secondary | ICD-10-CM | POA: Diagnosis not present

## 2023-05-18 DIAGNOSIS — R0789 Other chest pain: Secondary | ICD-10-CM | POA: Diagnosis not present

## 2023-05-18 DIAGNOSIS — Z801 Family history of malignant neoplasm of trachea, bronchus and lung: Secondary | ICD-10-CM

## 2023-05-18 DIAGNOSIS — G43E11 Chronic migraine with aura, intractable, with status migrainosus: Principal | ICD-10-CM

## 2023-05-18 DIAGNOSIS — R112 Nausea with vomiting, unspecified: Secondary | ICD-10-CM | POA: Diagnosis present

## 2023-05-18 DIAGNOSIS — Z5941 Food insecurity: Secondary | ICD-10-CM

## 2023-05-18 DIAGNOSIS — J45909 Unspecified asthma, uncomplicated: Secondary | ICD-10-CM | POA: Diagnosis present

## 2023-05-18 DIAGNOSIS — Z881 Allergy status to other antibiotic agents status: Secondary | ICD-10-CM | POA: Diagnosis not present

## 2023-05-18 DIAGNOSIS — Z9109 Other allergy status, other than to drugs and biological substances: Secondary | ICD-10-CM | POA: Diagnosis not present

## 2023-05-18 DIAGNOSIS — I160 Hypertensive urgency: Secondary | ICD-10-CM | POA: Diagnosis not present

## 2023-05-18 DIAGNOSIS — I708 Atherosclerosis of other arteries: Secondary | ICD-10-CM | POA: Diagnosis present

## 2023-05-18 DIAGNOSIS — K589 Irritable bowel syndrome without diarrhea: Secondary | ICD-10-CM | POA: Diagnosis present

## 2023-05-18 DIAGNOSIS — Z888 Allergy status to other drugs, medicaments and biological substances status: Secondary | ICD-10-CM | POA: Diagnosis not present

## 2023-05-18 DIAGNOSIS — F419 Anxiety disorder, unspecified: Secondary | ICD-10-CM | POA: Diagnosis present

## 2023-05-18 LAB — SARS CORONAVIRUS 2 BY RT PCR: SARS Coronavirus 2 by RT PCR: NEGATIVE

## 2023-05-18 LAB — CBC
HCT: 39.6 % (ref 39.0–52.0)
Hemoglobin: 13.1 g/dL (ref 13.0–17.0)
MCH: 31.6 pg (ref 26.0–34.0)
MCHC: 33.1 g/dL (ref 30.0–36.0)
MCV: 95.7 fL (ref 80.0–100.0)
Platelets: 150 10*3/uL (ref 150–400)
RBC: 4.14 MIL/uL — ABNORMAL LOW (ref 4.22–5.81)
RDW: 13 % (ref 11.5–15.5)
WBC: 7.2 10*3/uL (ref 4.0–10.5)
nRBC: 0 % (ref 0.0–0.2)

## 2023-05-18 LAB — COMPREHENSIVE METABOLIC PANEL
ALT: 37 U/L (ref 0–44)
AST: 24 U/L (ref 15–41)
Albumin: 2.8 g/dL — ABNORMAL LOW (ref 3.5–5.0)
Alkaline Phosphatase: 49 U/L (ref 38–126)
Anion gap: 7 (ref 5–15)
BUN: 12 mg/dL (ref 6–20)
CO2: 24 mmol/L (ref 22–32)
Calcium: 7.5 mg/dL — ABNORMAL LOW (ref 8.9–10.3)
Chloride: 105 mmol/L (ref 98–111)
Creatinine, Ser: 0.65 mg/dL (ref 0.61–1.24)
GFR, Estimated: >60 mL/min (ref >60)
Glucose, Bld: 114 mg/dL — ABNORMAL HIGH (ref 70–99)
Potassium: 4 mmol/L (ref 3.5–5.1)
Sodium: 136 mmol/L (ref 135–145)
Total Bilirubin: 0.8 mg/dL (ref 0.3–1.2)
Total Protein: 5 g/dL — ABNORMAL LOW (ref 6.5–8.1)

## 2023-05-18 MED ORDER — SODIUM CHLORIDE 0.9 % IV SOLN
12.5000 mg | Freq: Four times a day (QID) | INTRAVENOUS | Status: DC | PRN
  Filled 2023-05-18: qty 0.5

## 2023-05-18 MED ORDER — PROCHLORPERAZINE EDISYLATE 10 MG/2ML IJ SOLN
10.0000 mg | INTRAMUSCULAR | Status: AC
  Administered 2023-05-18: 10 mg via INTRAVENOUS
  Filled 2023-05-18: qty 2

## 2023-05-18 MED ORDER — VALPROATE SODIUM 100 MG/ML IV SOLN
1000.0000 mg | Freq: Once | INTRAVENOUS | Status: AC
  Administered 2023-05-18: 1000 mg via INTRAVENOUS
  Filled 2023-05-18: qty 10

## 2023-05-18 MED ORDER — DIPHENHYDRAMINE HCL 50 MG/ML IJ SOLN
25.0000 mg | Freq: Once | INTRAMUSCULAR | Status: AC
  Administered 2023-05-18: 25 mg via INTRAVENOUS
  Filled 2023-05-18: qty 1

## 2023-05-18 MED ORDER — LORAZEPAM 2 MG/ML IJ SOLN
1.0000 mg | Freq: Once | INTRAMUSCULAR | Status: AC
  Administered 2023-05-18: 1 mg via INTRAVENOUS
  Filled 2023-05-18: qty 1

## 2023-05-18 MED ORDER — DEXAMETHASONE SODIUM PHOSPHATE 4 MG/ML IJ SOLN
4.0000 mg | Freq: Once | INTRAMUSCULAR | Status: AC
  Administered 2023-05-18: 4 mg via INTRAVENOUS
  Filled 2023-05-18: qty 1

## 2023-05-18 MED ORDER — SODIUM CHLORIDE 0.9 % IV SOLN
25.0000 mg | INTRAVENOUS | Status: AC
  Administered 2023-05-18: 25 mg via INTRAVENOUS
  Filled 2023-05-18: qty 25

## 2023-05-18 MED ORDER — ROSUVASTATIN CALCIUM 20 MG PO TABS
20.0000 mg | ORAL_TABLET | Freq: Every day | ORAL | Status: DC
  Administered 2023-05-19: 20 mg via ORAL
  Filled 2023-05-18: qty 1

## 2023-05-18 MED ORDER — GADOBUTROL 1 MMOL/ML IV SOLN
5.5000 mL | Freq: Once | INTRAVENOUS | Status: AC | PRN
  Administered 2023-05-18: 5.5 mL via INTRAVENOUS

## 2023-05-18 MED ORDER — LACTATED RINGERS IV BOLUS
1000.0000 mL | Freq: Once | INTRAVENOUS | Status: AC
  Administered 2023-05-18: 1000 mL via INTRAVENOUS

## 2023-05-18 MED ORDER — KETOROLAC TROMETHAMINE 15 MG/ML IJ SOLN
15.0000 mg | Freq: Once | INTRAMUSCULAR | Status: AC
  Administered 2023-05-18: 15 mg via INTRAVENOUS
  Filled 2023-05-18: qty 1

## 2023-05-18 MED ORDER — HYDROMORPHONE HCL 1 MG/ML IJ SOLN
1.0000 mg | Freq: Once | INTRAMUSCULAR | Status: AC
  Administered 2023-05-18: 1 mg via INTRAVENOUS
  Filled 2023-05-18: qty 1

## 2023-05-18 NOTE — H&P (Signed)
Edwin Hall:[id number] DOB: 03-01-1966 DOA: 05/18/2023     PCP: Leane Call, PA-C   Outpatient Specialists:  [facility]urology    [facility]    Patient arrived to ER on 05/18/23 at 1433 Referred by Attending Franne Forts, DO   Patient coming from:    home Lives alone,     Chief Complaint:   Chief Complaint  Patient presents with   Migraine   Blurred Vision   Nausea   Emesis    HPI: Edwin Hall is a 57 y.o. male with medical history significant of IBS, hyperlipidemia, asthma, headache, malabsorption syndrome, protein calorie malnutrition, pneumothorax, partial esophagectomy in January of this year secondary to Barrett's esophagus     Presented with   headache Presents with migraine for the past 12 days nausea and blurred vision Was sent to ER by Neurology  Reports recurrent headaches/migraines since having been in a coma  Next treatment was going to get botox Has been sen for this in the past in ER But no improvement with treatment    States dilaudid is the best way to control headache and phenergan is the best way to cnrol nausea   Denies significant ETOH intake   Does not smoke   Lab Results  Component Value Date   SARSCOV2NAA NEGATIVE 11/12/2022   SARSCOV2NAA NEGATIVE 03/10/2021   SARSCOV2NAA NEGATIVE 10/03/2020   SARSCOV2NAA NEGATIVE 06/26/2020      Regarding pertinent Chronic problems:    Hyperlipidemia -  on statins Crestor   Asthma -well   controlled on home inhalers/ nebs                  While in ER:    No pain relive with multiple meds    Lab Orders         Comprehensive metabolic panel         CBC     MRI MRV of the brain non acute,        Following Medications were ordered in ER: Medications  valproate (DEPACON) 1,000 mg in dextrose 5 % 50 mL IVPB (0 mg Intravenous Stopped 05/18/23 1911)  LORazepam (ATIVAN) injection 1 mg (1 mg Intravenous Given 05/18/23 1619)  lactated ringers bolus 1,000 mL (0 mLs Intravenous Stopped  05/18/23 1725)  promethazine (PHENERGAN) 25 mg in sodium chloride 0.9 % 50 mL IVPB (0 mg Intravenous Stopped 05/18/23 1735)  gadobutrol (GADAVIST) 1 MMOL/ML injection 5.5 mL (5.5 mLs Intravenous Contrast Given 05/18/23 1909)  dexamethasone (DECADRON) injection 4 mg (4 mg Intravenous Given 05/18/23 1924)  ketorolac (TORADOL) 15 MG/ML injection 15 mg (15 mg Intravenous Given 05/18/23 1922)  prochlorperazine (COMPAZINE) injection 10 mg (10 mg Intravenous Given 05/18/23 1953)  diphenhydrAMINE (BENADRYL) injection 25 mg (25 mg Intravenous Given 05/18/23 1921)    _______________________________________________________ ER Provider Called: Neurology  Dr.  Philipp Deputy   They Recommend admit to medicine   Will see on arrivaL   ED Triage Vitals  Enc Vitals Group     BP 05/18/23 1449 (!) 127/93     Pulse Rate 05/18/23 1449 (!) 102     Resp 05/18/23 1449 (!) 22     Temp 05/18/23 1449 97.9 F (36.6 C)     Temp Source 05/18/23 1449 Oral     SpO2 05/18/23 1449 100 %     Weight 05/18/23 1445 120 lb (54.4 kg)     Height 05/18/23 1445 5\' 8"  (1.727 m)     Head Circumference --  Peak Flow --      Pain Score 05/18/23 1445 10     Pain Loc --      Pain Edu? --      Excl. in GC? --   TMAX(24)@     _________________________________________ Significant initial  Findings: Abnormal Labs Reviewed - No abnormal labs to display      ECG: Ordered Personally reviewed and interpreted by me showing: HR : 101 Rhythm: Sinus tachycardia Atrial premature complex Abnormal R-wave progression, early transition Minimal ST depression ST elevation, consider inferior injury Baseline wander in lead(s) V3 V4 Partial missing lead(s): V3 QTC 406    The recent clinical data is shown below. Vitals:   05/18/23 1730 05/18/23 1800 05/18/23 2100 05/18/23 2151  BP: (!) 139/100 (!) 144/100 134/87   Pulse: 94 91 75   Resp:  16 18   Temp:  98 F (36.7 C)  98.3 F (36.8 C)  TempSrc:  Oral  Oral  SpO2: 100% 100% 97%   Weight:       Height:        WBC     Component Value Date/Time   WBC 6.1 05/10/2023 0648   LYMPHSABS 1.5 05/10/2023 0648   MONOABS 0.5 05/10/2023 0648   EOSABS 0.0 05/10/2023 0648   BASOSABS 0.0 05/10/2023 0648          UA   ordered   _____________________________________________________ Recent Labs  Lab 05/16/23 0231  NA 137  K 3.6  GLUCOSE 117*  BUN 18  CREATININE 0.70    Cr  stable,   Lab Results  Component Value Date   CREATININE 0.70 05/16/2023   CREATININE 0.68 05/10/2023   CREATININE 0.87 11/26/2022    Recent Labs  Lab 05/18/23 2213  AST 24  ALT 37  ALKPHOS 49  BILITOT 0.8  PROT 5.0*  ALBUMIN 2.8*   Lab Results  Component Value Date   CALCIUM 7.4 (L) 05/10/2023   PHOS 2.5 09/15/2022       Plt: Lab Results  Component Value Date   PLT 152 05/10/2023      Recent Labs  Lab 05/16/23 0231  HGB 14.6  HCT 43.0    HG/HCT  stable,     Component Value Date/Time   HGB 14.6 05/16/2023 0231   HCT 43.0 05/16/2023 0231   MCV 92.7 05/10/2023 0648     _______________________________________________ Hospitalist was called for admission for  Status migronus The following Work up has been ordered so far:  Orders Placed This Encounter  Procedures   MR Brain W and Wo Contrast   MR MRV HEAD W WO CONTRAST   Comprehensive metabolic panel   CBC   Consult to neurology   Consult to hospitalist   EKG 12-Lead     OTHER Significant initial  Findings:    Cultures:    Component Value Date/Time   SDES  02/09/2022 1743    BLOOD RIGHT CHEST Performed at [facility], [address], [place], [place] [address]    SPECREQUEST  02/09/2022 1743    BOTTLES DRAWN AEROBIC AND ANAEROBIC Blood Culture adequate volume   CULT  02/09/2022 1743    NO GROWTH 5 DAYS Performed at [facility], [address], [place], [place] [address]    REPTSTATUS 02/14/2022 FINAL 02/09/2022 1743     Radiological Exams on Admission: MR Brain W and Wo  Contrast  Result Date: 05/18/2023 CLINICAL DATA:  Migraines, nausea vomiting EXAM: MRI HEAD WITHOUT AND WITH CONTRAST  MR VENOGRAM HEAD WITHOUT AND WITH CONTRAST TECHNIQUE: Multiplanar, multi-echo pulse sequences of the brain and surrounding structures were acquired without and with intravenous contrast. Angiographic images of the intracranial venous structures were acquired using MRV technique without and with intravenous contrast. CONTRAST:  5.72mL GADAVIST GADOBUTROL 1 MMOL/ML IV SOLN COMPARISON:  No prior MRI available, correlation is made with 05/16/2023 CTA head and neck FINDINGS: Evaluation is somewhat limited by motion artifact. MRI HEAD WITHOUT AND WITH CONTRAST Brain: No restricted diffusion to suggest acute or subacute infarct. No abnormal parenchymal or meningeal enhancement. No acute hemorrhage, mass, mass effect, or midline shift. No hydrocephalus or extra-axial collection. Normal pituitary and craniocervical junction. No hemosiderin deposition to suggest remote hemorrhage. Normal cerebral volume for age. 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 Scattered and confluent T2 hyperintense signal in the periventricular white matter, likely the sequela of mild-to-moderate chronic small vessel ischemic disease. Vascular: Normal arterial flow voids. Normal arterial and venous enhancement. Skull and upper cervical spine: Normal marrow signal. Sinuses/Orbits: Clear paranasal sinuses. No acute finding in the orbits. Other: The mastoid air cells are well aerated. MR VENOGRAM HEAD WITHOUT AND WITH CONTRAST There is no evidence of dural venous sinus or deep cerebral vein thrombosis. No dural venous sinus stenosis. IMPRESSION: 1. No acute intracranial process. No evidence of acute or subacute infarct. 2. No evidence of dural venous sinus thrombosis or stenosis. Electronically Signed   By: Wiliam Ke M.D.   On: 05/18/2023 19:46   MR MRV HEAD W WO CONTRAST  Result Date: 05/18/2023 CLINICAL DATA:  Migraines,  nausea vomiting EXAM: MRI HEAD WITHOUT AND WITH CONTRAST MR VENOGRAM HEAD WITHOUT AND WITH CONTRAST TECHNIQUE: Multiplanar, multi-echo pulse sequences of the brain and surrounding structures were acquired without and with intravenous contrast. Angiographic images of the intracranial venous structures were acquired using MRV technique without and with intravenous contrast. CONTRAST:  5.47mL GADAVIST GADOBUTROL 1 MMOL/ML IV SOLN COMPARISON:  No prior MRI available, correlation is made with 05/16/2023 CTA head and neck FINDINGS: Evaluation is somewhat limited by motion artifact. MRI HEAD WITHOUT AND WITH CONTRAST Brain: No restricted diffusion to suggest acute or subacute infarct. No abnormal parenchymal or meningeal enhancement. No acute hemorrhage, mass, mass effect, or midline shift. No hydrocephalus or extra-axial collection. Normal pituitary and craniocervical junction. No hemosiderin deposition to suggest remote hemorrhage. Normal cerebral volume for age. 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 Scattered and confluent T2 hyperintense signal in the periventricular white matter, likely the sequela of mild-to-moderate chronic small vessel ischemic disease. Vascular: Normal arterial flow voids. Normal arterial and venous enhancement. Skull and upper cervical spine: Normal marrow signal. Sinuses/Orbits: Clear paranasal sinuses. No acute finding in the orbits. Other: The mastoid air cells are well aerated. MR VENOGRAM HEAD WITHOUT AND WITH CONTRAST There is no evidence of dural venous sinus or deep cerebral vein thrombosis. No dural venous sinus stenosis. IMPRESSION: 1. No acute intracranial process. No evidence of acute or subacute infarct. 2. No evidence of dural venous sinus thrombosis or stenosis. Electronically Signed   By: Wiliam Ke M.D.   On: 05/18/2023 19:46   _______________________________________________________________________________________________________ Latest  Blood pressure 134/87, pulse  75, temperature 98.3 F (36.8 C), temperature source Oral, resp. rate 18, height 5\' 8"  (1.727 m), weight 54.4 kg, SpO2 97 %.   Vitals  labs and radiology finding personally reviewed  Review of Systems:    Pertinent positives include: blurry vision,  headaches,   Constitutional:  No weight loss, night sweats, Fevers, chills,  fatigue, weight loss  HEENT:  NoDifficulty swallowing,Tooth/dental problems,Sore throat,  No sneezing, itching, ear ache, nasal congestion, post nasal drip,  Cardio-vascular:  No chest pain, Orthopnea, PND, anasarca, dizziness, palpitations.no Bilateral lower extremity swelling  GI:  No heartburn, indigestion, abdominal pain, nausea, vomiting, diarrhea, change in bowel habits, loss of appetite, melena, blood in stool, hematemesis Resp:  no shortness of breath at rest. No dyspnea on exertion, No excess mucus, no productive cough, No non-productive cough, No coughing up of blood.No change in color of mucus.No wheezing. Skin:  no rash or lesions. No jaundice GU:  no dysuria, change in color of urine, no urgency or frequency. No straining to urinate.  No flank pain.  Musculoskeletal:  No joint pain or no joint swelling. No decreased range of motion. No back pain.  Psych:  No change in mood or affect. No depression or anxiety. No memory loss.  Neuro: no localizing neurological complaints, no tingling, no weakness, no double vision, no gait abnormality, no slurred speech, no confusion  All systems reviewed and apart from HOPI all are negative _______________________________________________________________________________________________ Past Medical History:   Past Medical History:  Diagnosis Date   Anxiety    Asthma    Barrett's esophagus    Blood transfusion without reported diagnosis    Complication of anesthesia    nasuea and vomiting   Food allergy    Headache    Hyperlipidemia    IBS (irritable bowel syndrome)    Iron deficiency anemia    Urticaria       Past Surgical History:  Procedure Laterality Date   ABDOMINAL SURGERY  12/2015   pylorus plasty   APPENDECTOMY  1984   BIOPSY  05/18/2020   Procedure: BIOPSY;  Surgeon: Kerin Salen, MD;  Location: [facility];  Service: Gastroenterology;;   CHOLECYSTECTOMY  2008   ESOPHAGOGASTRODUODENOSCOPY (EGD) WITH PROPOFOL N/A 05/18/2020   Procedure: ESOPHAGOGASTRODUODENOSCOPY (EGD) WITH PROPOFOL;  Surgeon: Kerin Salen, MD;  Location: [facility];  Service: Gastroenterology;  Laterality: N/A;   GASTROSTOMY TUBE PLACEMENT     HIATAL HERNIA REPAIR  07/2015   PARTIAL ESOPHAGECTOMY  12/10/2021   TALC PLEURODESIS     TONSILLECTOMY  1980   VENTRAL HERNIA REPAIR  2010    Social History:  Ambulatory   independently      reports that he has never smoked. He has never used smokeless tobacco. He reports that he does not drink alcohol and does not use drugs.   Family History:   Family History  Problem Relation Age of Onset   Parkinson's disease Mother    Angioedema Mother    Dementia Mother    Heart disease Father    Lung cancer Brother 70       non-smoker   Allergic rhinitis Neg Hx    Asthma Neg Hx    Eczema Neg Hx    Immunodeficiency Neg Hx    Urticaria Neg Hx    ______________________________________________________________________________________________ Allergies: Allergies  Allergen Reactions   Mushroom Extract Complex Anaphylaxis and Hives   Other Anaphylaxis    All seafood    Shellfish Allergy Anaphylaxis   Reglan [Metoclopramide] Hives    Severe itching all over   Zofran [Ondansetron Hcl] Itching and Rash    Patient states he develops a rash.   Tetracyclines & Related Itching     Prior to Admission medications   Medication Sig Start Date End Date Taking? Authorizing Provider  acetaminophen (TYLENOL) 500 MG tablet Take 1,000 mg by mouth every 6 (six)  hours as needed for moderate pain.   Yes [provider]  amitriptyline (ELAVIL) 25 MG tablet Take 25 mg by  mouth at bedtime as needed (migraines). 04/15/23  Yes [provider]  aspirin 81 MG EC tablet Take 81 mg by mouth at bedtime. Swallow whole.   Yes [provider]  atorvastatin (LIPITOR) 40 MG tablet Take 1 tablet by mouth at bedtime. 04/22/23  Yes [provider]  Cyanocobalamin (VITAMIN B 12 PO) Take 1,000 mcg by mouth daily.   Yes [provider]  cyclobenzaprine (FLEXERIL) 10 MG tablet Take 10 mg by mouth 3 (three) times daily as needed for muscle spasms. 07/05/22  Yes [provider]  famotidine (PEPCID) 40 MG tablet Take 40 mg by mouth 2 (two) times daily. 06/21/22  Yes [provider]  Ferrous Sulfate (IRON) 325 (65 Fe) MG TABS Take 325 mg by mouth every other day.  05/12/20  Yes [provider]  ipratropium (ATROVENT) 0.06 % nasal spray USE TWO SPRAYS IN EACH NOSTRIL TWICE DAILY AS DIRECTED. Patient taking differently: Place 1 spray into both nostrils daily as needed for rhinitis. 05/22/20  Yes Padgett, Pilar Grammes, MD  lidocaine (XYLOCAINE) 2 % solution Use as directed 10 mLs in the mouth or throat every 6 (six) hours as needed (stomach pain). 11/27/22  Yes Cardama, Amadeo Garnet, MD  MAGNESIUM-OXIDE 400 (240 Mg) MG tablet Take 1 tablet by mouth 2 (two) times daily. 04/09/23  Yes [provider]  metoCLOPramide (REGLAN) 10 MG tablet Take 10 mg by mouth daily. 06/30/22  Yes [provider]  pantoprazole (PROTONIX) 40 MG tablet Take 40 mg by mouth 2 (two) times daily.   Yes [provider]  rizatriptan (MAXALT) 10 MG tablet Take by mouth. 03/24/23  Yes [provider]  alum & mag hydroxide-simeth (MAALOX MAX) 400-400-40 MG/5ML suspension Take 10 mLs by mouth every 6 (six) hours as needed for indigestion. 11/27/22   Cardama, Amadeo Garnet, MD  chlorpheniramine-HYDROcodone (TUSSIONEX) 10-8 MG/5ML Take 5 mLs by mouth every 12 (twelve) hours as needed for cough. Patient not taking: Reported on 05/18/2023     [provider]  Cholecalciferol (VITAMIN D3) 10 MCG (400 UNIT) CAPS Take 400 Units by mouth daily.    [provider]  montelukast (SINGULAIR) 10 MG tablet Take 10 mg by mouth daily as needed (allergies). 12/11/21   [provider]  Multiple Vitamin (MULTIVITAMIN WITH MINERALS) TABS tablet Take 1 tablet by mouth daily.    [provider]  PROAIR DIGIHALER 108 [phone] Base) MCG/ACT AEPB Take 2 puffs by mouth every 4 (four) hours as needed (sob/wheezing). 09/22/21   [provider]  Probiotic Product (PROBIOTIC ADVANCED) CAPS Take 1 capsule by mouth 2 (two) times daily.    [provider]  promethazine (PHENERGAN) 25 MG suppository Place 1 suppository (25 mg total) rectally every 6 (six) hours as needed for nausea or vomiting. 11/27/22   Nira Conn, MD  promethazine (PHENERGAN) 25 MG tablet Take 25 mg by mouth every 6 (six) hours as needed for nausea or vomiting.    [provider]  rosuvastatin (CRESTOR) 20 MG tablet Take 20 mg by mouth at bedtime.    [provider]  sucralfate (CARAFATE) 1 g tablet Take 1 tablet (1 g total) by mouth 4 (four) times daily -  with meals and at bedtime. 10/17/20   Tilden Fossa, MD  traZODone (DESYREL) 50 MG tablet Take 50 mg by mouth at bedtime.  [provider]  Zinc 50 MG TABS Take 50 mg by mouth daily.     [provider]  azelastine (ASTELIN) 0.1 % nasal spray 2 sprays per nostril twice a day for control of nasal drainage. Patient not taking: Reported on 05/02/2020 11/27/19 05/02/20  Marcelyn Bruins, MD    ___________________________________________________________________________________________________ Physical Exam:    05/18/2023    9:00 PM 05/18/2023    6:00 PM 05/18/2023    5:30 PM  Vitals with BMI  Systolic 134 144 213  Diastolic 87 100 100  Pulse 75 91 94     1. General:  in No  Acute distress   Chronically ill-appearing 2. Psychological: Alert  and   Oriented 3. Head/ENT:   Dry Mucous Membranes                          Head Non traumatic, neck supple                          Poor Dentition 4. SKIN:  decreased Skin turgor,  Skin clean Dry and intact no rash    5. Heart: Regular rate and rhythm no  Murmur, no Rub or gallop 6. Lungs: no wheezes or crackles   7. Abdomen: Soft,  non-tender, Non distended bowel sounds present 8. Lower extremities: no clubbing, cyanosis, no  edema 9. Neurologically  strength 5 out of 5 in all 4 extremities cranial nerves II through XII intact 10. MSK: Normal range of motion    Chart has been reviewed  ______________________________________________________________________________________________  Assessment/Plan 57 y.o. male with medical history significant of IBS, hyperlipidemia, asthma, headache, malabsorption syndrome, protein calorie malnutrition, pneumothorax, partial esophagectomy in January of this year secondary to Barrett's esophagus   Admitted for intractable migrane   Present on Admission:  Status migrainosus  Dyslipidemia  Intractable nausea and vomiting  Protein-calorie malnutrition, severe (HCC)    Status migrainosus Pain seems to be initially difficult to control although patient is requesting Dilaudid and says that that does help with pain.  Original plan was to transfer patient to [facility] For DHE infusion. Given somewhat inconsistent history for right now we will see if able to control pain and hold off on DHE.  Appreciate neurology consult  Dyslipidemia Continue Crestor 20 mg a day  Intractable nausea and vomiting Patient states only Phenergan helps allergic to Zofran and Reglan.  Protein-calorie malnutrition, severe (HCC) Check prealbumin would benefit from nutritional consult   Other plan as per orders.  DVT prophylaxis:  SCD     Code Status:    Code Status: Prior FULL CODE as per patient   I had personally discussed CODE STATUS with patient   ACP  none   Family Communication:   Family not at  Bedside    Diet heart healthy   Disposition Plan:      To home once workup is complete and patient is stable   Following barriers for discharge:                            Electrolytes corrected  Will need consultants to evaluate patient prior to discharge       Consult Orders  (From admission, onward)           Start     Ordered   05/18/23 2218  Consult to hospitalist  Once       Provider:  (Not yet assigned)  Question Answer Comment  Place call to: [facility]   Reason for Consult Admit      05/18/23 2217                              Nutrition    consulted             Consults called: neurology    Admission status:  ED Disposition     ED Disposition  Admit   Condition  --   Comment  Hospital Area: [facility] [[id number]]  Level of Care: Progressive [102]  Admit to Progressive based on following criteria: NEUROLOGICAL AND NEUROSURGICAL complex patients with significant risk of instability, who do not meet ICU criteria, yet require close observation or frequent assessment (< / = every 2 - 4 hours) with medical / nursing intervention.  May admit patient to [facility] or [facility] if equivalent level of care is available:: No  Covid Evaluation: Asymptomatic - no recent exposure (last 10 days) testing not required  Diagnosis: Status migrainosus [272575]  Admitting Physician: Therisa Doyne [[id number]]  Attending Physician: Therisa Doyne [[id number]]  Certification:: I certify this patient will need inpatient services for at least 2 midnights  Estimated Length of Stay: 2            inpatient     I Expect 2 midnight stay secondary to severity of patient's current illness need for inpatient interventions justified by the following: Status migrainous and extensive comorbidities including:  Chronic pain   That are currently  affecting medical management.   I expect  patient to be hospitalized for 2 midnights requiring inpatient medical care.  Patient is at high risk for adverse outcome (such as loss of life or disability) if not treated.  Indication for inpatient stay as follows:  Severe change from baseline regarding mental status   severe pain requiring acute inpatient management,      Need for   IV fluids  IV pain medications,     Level of care         progressive tele indefinitely please discontinue once patient no longer qualifies COVID-19 Labs    Lynette Noah 05/19/2023, 12:52 AM    [facility]     after 2 AM please page floor coverage PA If 7AM-7PM, please contact the day team taking care of the patient using [email or web address]

## 2023-05-18 NOTE — Subjective & Objective (Signed)
Presents with migraine for the past 12 days nausea and blurred vision Was sent to ER by Neurology  Reports recurrent headaches/migraines since having been in a coma  Next treatment was going to get botox Has been sen for this in the past in ER But no improvement with treatment

## 2023-05-18 NOTE — ED Provider Notes (Signed)
[place] EMERGENCY DEPARTMENT AT [facility] Provider Note   CSN: [id number] Arrival date & time: 05/18/23  1433     History Chief Complaint  Patient presents with   Migraine   Blurred Vision   Nausea   Emesis    GEOMAR TOYE is a 57 y.o. male with h/o migraines presents to the ER for evaluation of migraine for the past 12 days. The patient has been seen in the ER multiple times for this migraine as well as seeing his neurologist yesterday.  He was prescribed medications for this however he reports that they were not ready for him at his pharmacy and has not taken any.  His last medications that he took were as amitriptyline and Tylenol around 0700 this morning.  Took Phenergan at 0600 this morning.  He reports that his headaches are usually bifrontal and bitemporal.  He has had nausea and vomiting which is typical with his migraines.  He denies any abdominal pain, fevers, chills, rhinorrhea, nasal congestion, neck pain, chest pain, or shortness of breath.  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  He has tried multiple medications for his migraines without much relief.  He reports that he was encouraged to come in by his neurologist into the ER.  He has had blurry vision which has been a new symptom for the past few days.  Denies any double vision.  Reports some photophobia but denies any phonophobia.  He is allergic to tetracyclines, Zofran, Reglan, and mushrooms.  Denies any tobacco, EtOH, illicit drug use.   Migraine Associated symptoms include headaches. Pertinent negatives include no chest pain, no abdominal pain and no shortness of breath.  Emesis Associated symptoms: headaches   Associated symptoms: no abdominal pain, no chills, no diarrhea and no fever        Home Medications Prior to Admission medications   Medication Sig Start Date End Date Taking? Authorizing Provider  acetaminophen (TYLENOL) 500 MG  tablet Take 1,000 mg by mouth every 6 (six) hours as needed for moderate pain.   Yes [provider]  amitriptyline (ELAVIL) 25 MG tablet Take 25 mg by mouth at bedtime as needed (migraines). 04/15/23  Yes [provider]  aspirin 81 MG EC tablet Take 81 mg by mouth at bedtime. Swallow whole.   Yes [provider]  atorvastatin (LIPITOR) 40 MG tablet Take 1 tablet by mouth at bedtime. 04/22/23  Yes [provider]  Cyanocobalamin (VITAMIN B 12 PO) Take 1,000 mcg by mouth daily.   Yes [provider]  cyclobenzaprine (FLEXERIL) 10 MG tablet Take 10 mg by mouth 3 (three) times daily as needed for muscle spasms. 07/05/22  Yes [provider]  famotidine (PEPCID) 40 MG tablet Take 40 mg by mouth 2 (two) times daily. 06/21/22  Yes [provider]  Ferrous Sulfate (IRON) 325 (65 Fe) MG TABS Take 325 mg by mouth every other day.  05/12/20  Yes [provider]  ipratropium (ATROVENT) 0.06 % nasal spray USE TWO SPRAYS IN EACH NOSTRIL TWICE DAILY AS DIRECTED. Patient taking differently: Place 1 spray into both nostrils daily as needed for rhinitis. 05/22/20  Yes Padgett, Pilar Grammes, MD  lidocaine (XYLOCAINE) 2 % solution Use as directed 10 mLs in the mouth or throat every 6 (six) hours as needed (stomach pain). 11/27/22  Yes Cardama, Amadeo Garnet, MD  MAGNESIUM-OXIDE 400 (240 Mg) MG tablet Take 1 tablet by  mouth 2 (two) times daily. 04/09/23  Yes [provider]  metoCLOPramide (REGLAN) 10 MG tablet Take 10 mg by mouth daily. 06/30/22  Yes [provider]  Multiple Vitamin (MULTIVITAMIN WITH MINERALS) TABS tablet Take 1 tablet by mouth daily.   Yes [provider]  pantoprazole (PROTONIX) 40 MG tablet Take 40 mg by mouth 2 (two) times daily.   Yes [provider]  rizatriptan (MAXALT) 10 MG tablet Take by mouth. 03/24/23  Yes [provider]  alum & mag hydroxide-simeth (MAALOX MAX) 400-400-40 MG/5ML  suspension Take 10 mLs by mouth every 6 (six) hours as needed for indigestion. 11/27/22   Cardama, Amadeo Garnet, MD  chlorpheniramine-HYDROcodone (TUSSIONEX) 10-8 MG/5ML Take 5 mLs by mouth every 12 (twelve) hours as needed for cough. Patient not taking: Reported on 05/18/2023    [provider]  Cholecalciferol (VITAMIN D3) 10 MCG (400 UNIT) CAPS Take 400 Units by mouth daily.    [provider]  montelukast (SINGULAIR) 10 MG tablet Take 10 mg by mouth daily as needed (allergies). 12/11/21   [provider]  PROAIR DIGIHALER 108 (90 Base) MCG/ACT AEPB Take 2 puffs by mouth every 4 (four) hours as needed (sob/wheezing). 09/22/21   [provider]  Probiotic Product (PROBIOTIC ADVANCED) CAPS Take 1 capsule by mouth 2 (two) times daily.    [provider]  promethazine (PHENERGAN) 25 MG suppository Place 1 suppository (25 mg total) rectally every 6 (six) hours as needed for nausea or vomiting. 11/27/22   Nira Conn, MD  promethazine (PHENERGAN) 25 MG tablet Take 25 mg by mouth every 6 (six) hours as needed for nausea or vomiting.    [provider]  rosuvastatin (CRESTOR) 20 MG tablet Take 20 mg by mouth at bedtime.    [provider]  sucralfate (CARAFATE) 1 g tablet Take 1 tablet (1 g total) by mouth 4 (four) times daily -  with meals and at bedtime. 10/17/20   Tilden Fossa, MD  traZODone (DESYREL) 50 MG tablet Take 50 mg by mouth at bedtime.    [provider]  Zinc 50 MG TABS Take 50 mg by mouth daily.     [provider]  azelastine (ASTELIN) 0.1 % nasal spray 2 sprays per nostril twice a day for control of nasal drainage. Patient not taking: Reported on 05/02/2020 11/27/19 05/02/20  Marcelyn Bruins, MD      Allergies    Mushroom extract complex, Other, Shellfish allergy, Reglan [metoclopramide], Zofran [ondansetron hcl], and Tetracyclines & related    Review of Systems   Review of Systems   Constitutional:  Negative for chills and fever.  HENT:  Negative for congestion and rhinorrhea.   Eyes:  Positive for photophobia and visual disturbance.  Respiratory:  Negative for shortness of breath.   Cardiovascular:  Negative for chest pain.  Gastrointestinal:  Positive for nausea and vomiting. Negative for abdominal pain, constipation and diarrhea.  Musculoskeletal:  Negative for neck pain and neck stiffness.  Neurological:  Positive for headaches. Negative for dizziness, syncope, speech difficulty, weakness and light-headedness.    Physical Exam Updated Vital Signs BP 134/87 (BP Location: Right Arm)   Pulse 75   Temp 98.3 F (36.8 C) (Oral)   Resp 18   Ht 5\' 8"  (1.727 m)   Wt 54.4 kg   SpO2 97%   BMI 18.25 kg/m  Physical Exam Vitals and nursing note reviewed.  Constitutional:      General: He is not in  acute distress.    Appearance: He is not toxic-appearing.  HENT:     Head: Normocephalic and atraumatic.     Comments: Forehead and scalp nontender to palpation. I do not appreciate any overlying lesions or rash.     Right Ear: Tympanic membrane, ear canal and external ear normal.     Left Ear: Tympanic membrane, ear canal and external ear normal.     Mouth/Throat:     Mouth: Mucous membranes are moist.  Eyes:     General: No scleral icterus.    Extraocular Movements: Extraocular movements intact.     Pupils: Pupils are equal, round, and reactive to light.  Cardiovascular:     Rate and Rhythm: Normal rate and regular rhythm.  Pulmonary:     Effort: Pulmonary effort is normal. No respiratory distress.     Comments: Port present in right chest Musculoskeletal:        General: No deformity.     Cervical back: Normal range of motion. No rigidity or tenderness.     Right lower leg: No edema.     Left lower leg: No edema.  Skin:    General: Skin is warm and dry.  Neurological:     General: No focal deficit present.     Mental Status: He is alert. Mental status is  at baseline.     GCS: GCS eye subscore is 4. GCS verbal subscore is 5. GCS motor subscore is 6.     Cranial Nerves: No cranial nerve deficit, dysarthria or facial asymmetry.     Sensory: No sensory deficit.     Motor: Weakness present. No pronator drift.     Comments: Cranial nerves II through XII intact.  Patient answering questions appropriately with appropriate speech.  No facial droop noted.  GCS 15.  Sensation reportedly intact and symmetric bilaterally per patient.  No pronator drift.  Strength is diminished but symmetric in all extremities.  I do not appreciate any focal weakness.  He does have some visual field changes however is not congruent or in line with any hemianopsia.  He was seeing more fingers that were held up in the lateral and medial aspects of the vision as well as more superior and inferior vision fields.  Affected more with his left eye them with his right however the patient adamantly denies any double vision.     ED Results / Procedures / Treatments   Labs (all labs ordered are listed, but only abnormal results are displayed) Labs Reviewed  COMPREHENSIVE METABOLIC PANEL - Abnormal; Notable for the following components:      Result Value   Glucose, Bld 114 (*)    Calcium 7.5 (*)    Total Protein 5.0 (*)    Albumin 2.8 (*)    All other components within normal limits  CBC - Abnormal; Notable for the following components:   RBC 4.14 (*)    All other components within normal limits  SARS CORONAVIRUS 2 BY RT PCR  CK  MAGNESIUM  PHOSPHORUS  TSH    EKG None  Radiology MR Brain W and Wo Contrast  Result Date: 05/18/2023 CLINICAL DATA:  Migraines, nausea vomiting EXAM: MRI HEAD WITHOUT AND WITH CONTRAST MR VENOGRAM HEAD WITHOUT AND WITH CONTRAST TECHNIQUE: Multiplanar, multi-echo pulse sequences of the brain and surrounding structures were acquired without and with intravenous contrast. Angiographic images of the intracranial venous structures were acquired using  MRV technique without and with intravenous contrast. CONTRAST:  5.60mL GADAVIST GADOBUTROL 1  MMOL/ML IV SOLN COMPARISON:  No prior MRI available, correlation is made with 05/16/2023 CTA head and neck FINDINGS: Evaluation is somewhat limited by motion artifact. MRI HEAD WITHOUT AND WITH CONTRAST Brain: No restricted diffusion to suggest acute or subacute infarct. No abnormal parenchymal or meningeal enhancement. No acute hemorrhage, mass, mass effect, or midline shift. No hydrocephalus or extra-axial collection. Normal pituitary and craniocervical junction. No hemosiderin deposition to suggest remote hemorrhage. Normal cerebral volume for age. 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 Scattered and confluent T2 hyperintense signal in the periventricular white matter, likely the sequela of mild-to-moderate chronic small vessel ischemic disease. Vascular: Normal arterial flow voids. Normal arterial and venous enhancement. Skull and upper cervical spine: Normal marrow signal. Sinuses/Orbits: Clear paranasal sinuses. No acute finding in the orbits. Other: The mastoid air cells are well aerated. MR VENOGRAM HEAD WITHOUT AND WITH CONTRAST There is no evidence of dural venous sinus or deep cerebral vein thrombosis. No dural venous sinus stenosis. IMPRESSION: 1. No acute intracranial process. No evidence of acute or subacute infarct. 2. No evidence of dural venous sinus thrombosis or stenosis. Electronically Signed   By: Wiliam Ke M.D.   On: 05/18/2023 19:46   MR MRV HEAD W WO CONTRAST  Result Date: 05/18/2023 CLINICAL DATA:  Migraines, nausea vomiting EXAM: MRI HEAD WITHOUT AND WITH CONTRAST MR VENOGRAM HEAD WITHOUT AND WITH CONTRAST TECHNIQUE: Multiplanar, multi-echo pulse sequences of the brain and surrounding structures were acquired without and with intravenous contrast. Angiographic images of the intracranial venous structures were acquired using MRV technique without and with intravenous contrast. CONTRAST:   5.14mL GADAVIST GADOBUTROL 1 MMOL/ML IV SOLN COMPARISON:  No prior MRI available, correlation is made with 05/16/2023 CTA head and neck FINDINGS: Evaluation is somewhat limited by motion artifact. MRI HEAD WITHOUT AND WITH CONTRAST Brain: No restricted diffusion to suggest acute or subacute infarct. No abnormal parenchymal or meningeal enhancement. No acute hemorrhage, mass, mass effect, or midline shift. No hydrocephalus or extra-axial collection. Normal pituitary and craniocervical junction. No hemosiderin deposition to suggest remote hemorrhage. Normal cerebral volume for age. 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 Scattered and confluent T2 hyperintense signal in the periventricular white matter, likely the sequela of mild-to-moderate chronic small vessel ischemic disease. Vascular: Normal arterial flow voids. Normal arterial and venous enhancement. Skull and upper cervical spine: Normal marrow signal. Sinuses/Orbits: Clear paranasal sinuses. No acute finding in the orbits. Other: The mastoid air cells are well aerated. MR VENOGRAM HEAD WITHOUT AND WITH CONTRAST There is no evidence of dural venous sinus or deep cerebral vein thrombosis. No dural venous sinus stenosis. IMPRESSION: 1. No acute intracranial process. No evidence of acute or subacute infarct. 2. No evidence of dural venous sinus thrombosis or stenosis. Electronically Signed   By: Wiliam Ke M.D.   On: 05/18/2023 19:46    Procedures Procedures   Medications Ordered in ED Medications  rosuvastatin (CRESTOR) tablet 20 mg (has no administration in time range)  promethazine (PHENERGAN) 12.5 mg in sodium chloride 0.9 % 50 mL IVPB (has no administration in time range)  valproate (DEPACON) 1,000 mg in dextrose 5 % 50 mL IVPB (0 mg Intravenous Stopped 05/18/23 1911)  LORazepam (ATIVAN) injection 1 mg (1 mg Intravenous Given 05/18/23 1619)  lactated ringers bolus 1,000 mL (0 mLs Intravenous Stopped 05/18/23 1725)  promethazine (PHENERGAN) 25 mg  in sodium chloride 0.9 % 50 mL IVPB (0 mg Intravenous Stopped 05/18/23 1735)  gadobutrol (GADAVIST) 1 MMOL/ML injection 5.5 mL (5.5 mLs Intravenous  Contrast Given 05/18/23 1909)  dexamethasone (DECADRON) injection 4 mg (4 mg Intravenous Given 05/18/23 1924)  ketorolac (TORADOL) 15 MG/ML injection 15 mg (15 mg Intravenous Given 05/18/23 1922)  prochlorperazine (COMPAZINE) injection 10 mg (10 mg Intravenous Given 05/18/23 1953)  diphenhydrAMINE (BENADRYL) injection 25 mg (25 mg Intravenous Given 05/18/23 1921)  HYDROmorphone (DILAUDID) injection 1 mg (1 mg Intravenous Given 05/18/23 2341)    ED Course/ Medical Decision Making/ A&P    Medical Decision Making Amount and/or Complexity of Data Reviewed Labs: ordered. Radiology: ordered.  Risk Prescription drug management. Decision regarding hospitalization.   57 y.o. male presents to the ER for evaluation of headache with . Differential diagnosis includes but is not limited to Stroke, increased ICP, meningitis, CVA, intracranial tumor, venous sinus thrombosis, migraine, cluster headache, hypertension, drug related, head injury, tension headache, sinusitis, dental abscess, otitis media, TMJ, temporal arteritis, glaucoma, trigeminal neuralgia. Vital signs unremarkable. Physical exam as noted above.   I consulted neurology and spoke with Dr. Bing Neighbors. She recommends MRI with and without contrast and well as a MRV. Recommends Depakote 1g. She reports that the vision changes is likely migraine process.   Patient is still having pain after the depakote, Dr. Selina Cooley recommends toradol, compazine, and benadryl. If not improved after 3 hours with those medications, will need to be admitted over at [facility] for DHE.   I independently reviewed and interpreted the patient's labs.  CBC without cytosis or anemia.  COVID-negative.  CMP shows of a glucose of 114, mildly decreased calcium, total protein, and albumin otherwise no electrolyte or LFT abnormality.  MRI shows   1. No acute intracranial process. No evidence of acute or subacute infarct. 2. No evidence of dural venous sinus thrombosis or stenosis.  On re-assessment patient reports his pain has not improved, however was resting comfortably in a well lit room. He does not have any nuchal rigidity, neck pain, fevers, or any confusion.  Less likely for any meningitis.  He reports this headaches been consistent and steady for the past 12 days.  He was already seen by his neurologist yesterday for ophthalmologic exam did not show any papilledema.  Given reassuring MRI imaging as well, probable intractable headache.  Followed up with neurology again, will transfer the patient over to [facility] for DHE treatment. [facility] hospitalist to admit. Patient amenable to admission.   Portions of this report may have been transcribed using voice recognition software. Every effort was made to ensure accuracy; however, inadvertent computerized transcription errors may be present.   Final Clinical Impression(s) / ED Diagnoses Final diagnoses:  Intractable chronic migraine with aura with status migrainosus    Rx / DC Orders ED Discharge Orders     None         Achille Rich, PA-C 05/19/23 0104    Franne Forts, DO 05/22/23 0820

## 2023-05-18 NOTE — ED Triage Notes (Signed)
Pt has had a Migraine for 12 days, Nausea, and blurred vision. Saw Neurology yesterday, talked them today who told him to come to the ED

## 2023-05-19 DIAGNOSIS — G43901 Migraine, unspecified, not intractable, with status migrainosus: Secondary | ICD-10-CM

## 2023-05-19 DIAGNOSIS — R072 Precordial pain: Secondary | ICD-10-CM

## 2023-05-19 DIAGNOSIS — E785 Hyperlipidemia, unspecified: Secondary | ICD-10-CM

## 2023-05-19 DIAGNOSIS — R112 Nausea with vomiting, unspecified: Secondary | ICD-10-CM

## 2023-05-19 DIAGNOSIS — G43E11 Chronic migraine with aura, intractable, with status migrainosus: Secondary | ICD-10-CM | POA: Diagnosis not present

## 2023-05-19 DIAGNOSIS — R079 Chest pain, unspecified: Secondary | ICD-10-CM | POA: Diagnosis present

## 2023-05-19 LAB — COMPREHENSIVE METABOLIC PANEL
ALT: 38 U/L (ref 0–44)
AST: 25 U/L (ref 15–41)
Albumin: 2.7 g/dL — ABNORMAL LOW (ref 3.5–5.0)
Alkaline Phosphatase: 53 U/L (ref 38–126)
Anion gap: 8 (ref 5–15)
BUN: 13 mg/dL (ref 6–20)
CO2: 24 mmol/L (ref 22–32)
Calcium: 8.1 mg/dL — ABNORMAL LOW (ref 8.9–10.3)
Chloride: 102 mmol/L (ref 98–111)
Creatinine, Ser: 0.66 mg/dL (ref 0.61–1.24)
GFR, Estimated: >60 mL/min (ref >60)
Glucose, Bld: 107 mg/dL — ABNORMAL HIGH (ref 70–99)
Potassium: 4.2 mmol/L (ref 3.5–5.1)
Sodium: 134 mmol/L — ABNORMAL LOW (ref 135–145)
Total Bilirubin: 0.7 mg/dL (ref 0.3–1.2)
Total Protein: 5 g/dL — ABNORMAL LOW (ref 6.5–8.1)

## 2023-05-19 LAB — CBC
HCT: 40.1 % (ref 39.0–52.0)
Hemoglobin: 13.9 g/dL (ref 13.0–17.0)
MCH: 32.1 pg (ref 26.0–34.0)
MCHC: 34.7 g/dL (ref 30.0–36.0)
MCV: 92.6 fL (ref 80.0–100.0)
Platelets: 151 10*3/uL (ref 150–400)
RBC: 4.33 MIL/uL (ref 4.22–5.81)
RDW: 13.1 % (ref 11.5–15.5)
WBC: 6.9 10*3/uL (ref 4.0–10.5)
nRBC: 0 % (ref 0.0–0.2)

## 2023-05-19 LAB — PHOSPHORUS: Phosphorus: 3.8 mg/dL (ref 2.5–4.6)

## 2023-05-19 LAB — D-DIMER, QUANTITATIVE: D-Dimer, Quant: 0.49 ug/mL-FEU (ref 0.00–0.50)

## 2023-05-19 LAB — MAGNESIUM
Magnesium: 1.6 mg/dL — ABNORMAL LOW (ref 1.7–2.4)
Magnesium: 1.7 mg/dL (ref 1.7–2.4)

## 2023-05-19 LAB — TROPONIN I (HIGH SENSITIVITY)
Troponin I (High Sensitivity): 8 ng/L (ref <18)
Troponin I (High Sensitivity): 23 ng/L — ABNORMAL HIGH (ref <18)

## 2023-05-19 LAB — TSH: TSH: 1.024 u[IU]/mL (ref 0.350–4.500)

## 2023-05-19 LAB — CK: Total CK: 39 U/L — ABNORMAL LOW (ref 49–397)

## 2023-05-19 LAB — MRSA NEXT GEN BY PCR, NASAL: MRSA by PCR Next Gen: NOT DETECTED

## 2023-05-19 MED ORDER — PROCHLORPERAZINE EDISYLATE 10 MG/2ML IJ SOLN: 10.0000 mg | Freq: Four times a day (QID) | INTRAMUSCULAR | Status: DC | PRN

## 2023-05-19 MED ORDER — SODIUM CHLORIDE 0.9 % IV SOLN
12.5000 mg | Freq: Once | INTRAVENOUS | Status: AC
  Administered 2023-05-19: 12.5 mg via INTRAVENOUS
  Filled 2023-05-19: qty 0.5

## 2023-05-19 MED ORDER — SODIUM CHLORIDE 0.9 % IV BOLUS
1000.0000 mL | Freq: Once | INTRAVENOUS | Status: AC
  Administered 2023-05-19: 1000 mL via INTRAVENOUS

## 2023-05-19 MED ORDER — HYDROMORPHONE HCL 1 MG/ML IJ SOLN
0.5000 mg | INTRAMUSCULAR | Status: DC | PRN
  Administered 2023-05-20 (×2): 1 mg via INTRAVENOUS
  Filled 2023-05-19 (×2): qty 1

## 2023-05-19 MED ORDER — DIHYDROERGOTAMINE MESYLATE 1 MG/ML IJ SOLN
0.5000 mg | Freq: Once | INTRAMUSCULAR | Status: AC
  Administered 2023-05-19: 0.5 mg via INTRAVENOUS
  Filled 2023-05-19: qty 0.5

## 2023-05-19 MED ORDER — SODIUM CHLORIDE 0.9 % IV SOLN: INTRAVENOUS | Status: AC

## 2023-05-19 MED ORDER — FAMOTIDINE 20 MG PO TABS
40.0000 mg | ORAL_TABLET | Freq: Two times a day (BID) | ORAL | Status: DC
  Administered 2023-05-19 – 2023-05-25 (×13): 40 mg via ORAL
  Filled 2023-05-19 (×13): qty 2

## 2023-05-19 MED ORDER — HYDRALAZINE HCL 20 MG/ML IJ SOLN: 10.0000 mg | Freq: Once | INTRAMUSCULAR | Status: AC

## 2023-05-19 MED ORDER — COPPER 2 MG PO TABS
2.0000 mg | Freq: Every day | Status: DC
  Administered 2023-05-19 – 2023-05-25 (×7): 2 mg via ORAL
  Filled 2023-05-19 (×7): qty 1

## 2023-05-19 MED ORDER — FENTANYL CITRATE PF 50 MCG/ML IJ SOSY
12.5000 ug | PREFILLED_SYRINGE | INTRAMUSCULAR | Status: DC | PRN
  Administered 2023-05-19 (×7): 50 ug via INTRAVENOUS
  Filled 2023-05-19 (×7): qty 1

## 2023-05-19 MED ORDER — CYCLOBENZAPRINE HCL 10 MG PO TABS
10.0000 mg | ORAL_TABLET | Freq: Three times a day (TID) | ORAL | Status: DC | PRN
  Administered 2023-05-21 – 2023-05-23 (×5): 10 mg via ORAL
  Filled 2023-05-19 (×6): qty 1

## 2023-05-19 MED ORDER — PROCHLORPERAZINE EDISYLATE 10 MG/2ML IJ SOLN
10.0000 mg | Freq: Four times a day (QID) | INTRAMUSCULAR | Status: DC | PRN
  Administered 2023-05-19: 10 mg via INTRAVENOUS
  Filled 2023-05-19: qty 2

## 2023-05-19 MED ORDER — HYDRALAZINE HCL 20 MG/ML IJ SOLN
INTRAMUSCULAR | Status: AC
  Administered 2023-05-19: 10 mg via INTRAVENOUS
  Filled 2023-05-19: qty 1

## 2023-05-19 MED ORDER — ADULT MULTIVITAMIN W/MINERALS CH
1.0000 | ORAL_TABLET | Freq: Every day | ORAL | Status: DC
  Administered 2023-05-20 – 2023-05-25 (×6): 1 via ORAL
  Filled 2023-05-19 (×6): qty 1

## 2023-05-19 MED ORDER — METOPROLOL TARTRATE 25 MG PO TABS
25.0000 mg | ORAL_TABLET | Freq: Two times a day (BID) | ORAL | Status: DC
  Administered 2023-05-19 – 2023-05-25 (×11): 25 mg via ORAL
  Filled 2023-05-19 (×12): qty 1

## 2023-05-19 MED ORDER — ENSURE ENLIVE PO LIQD: 237.0000 mL | Freq: Two times a day (BID) | ORAL | Status: DC

## 2023-05-19 MED ORDER — MONTELUKAST SODIUM 10 MG PO TABS
10.0000 mg | ORAL_TABLET | Freq: Every day | ORAL | Status: DC | PRN
  Administered 2023-05-23: 10 mg via ORAL
  Filled 2023-05-19: qty 1

## 2023-05-19 MED ORDER — NITROGLYCERIN 0.4 MG SL SUBL
0.4000 mg | SUBLINGUAL_TABLET | Freq: Once | SUBLINGUAL | Status: AC
  Administered 2023-05-19: 0.4 mg via SUBLINGUAL

## 2023-05-19 MED ORDER — BUTALBITAL-APAP-CAFFEINE 50-325-40 MG PO TABS
1.0000 | ORAL_TABLET | Freq: Four times a day (QID) | ORAL | Status: DC | PRN
  Administered 2023-05-19 (×3): 1 via ORAL
  Filled 2023-05-19 (×2): qty 1

## 2023-05-19 MED ORDER — NITROGLYCERIN 0.4 MG SL SUBL
SUBLINGUAL_TABLET | SUBLINGUAL | Status: AC
  Filled 2023-05-19: qty 1

## 2023-05-19 MED ORDER — ACETAMINOPHEN 325 MG PO TABS
650.0000 mg | ORAL_TABLET | Freq: Four times a day (QID) | ORAL | Status: DC | PRN
  Administered 2023-05-20 – 2023-05-23 (×4): 650 mg via ORAL
  Filled 2023-05-19 (×6): qty 2

## 2023-05-19 MED ORDER — ORAL CARE MOUTH RINSE: 15.0000 mL | OROMUCOSAL | Status: DC | PRN

## 2023-05-19 MED ORDER — HYDRALAZINE HCL 20 MG/ML IJ SOLN
10.0000 mg | Freq: Four times a day (QID) | INTRAMUSCULAR | Status: DC | PRN
  Administered 2023-05-22 – 2023-05-23 (×2): 10 mg via INTRAVENOUS
  Filled 2023-05-19 (×2): qty 1

## 2023-05-19 MED ORDER — KETAMINE HCL-SODIUM CHLORIDE 1000-0.9 MG/100ML-% IV SOLN
0.7500 mg/kg/h | INTRAVENOUS | Status: DC
  Administered 2023-05-19: 0.25 mg/kg/h via INTRAVENOUS
  Administered 2023-05-21: 0.5 mg/kg/h via INTRAVENOUS
  Filled 2023-05-19 (×4): qty 100

## 2023-05-19 MED ORDER — MAGNESIUM SULFATE 2 GM/50ML IV SOLN
2.0000 g | Freq: Once | INTRAVENOUS | Status: AC
  Administered 2023-05-19: 2 g via INTRAVENOUS
  Filled 2023-05-19: qty 50

## 2023-05-19 MED ORDER — SODIUM CHLORIDE 0.9 % IV SOLN: INTRAVENOUS | Status: DC

## 2023-05-19 MED ORDER — PANTOPRAZOLE SODIUM 40 MG PO TBEC
40.0000 mg | DELAYED_RELEASE_TABLET | Freq: Two times a day (BID) | ORAL | Status: DC
  Administered 2023-05-19 – 2023-05-20 (×4): 40 mg via ORAL
  Filled 2023-05-19 (×4): qty 1

## 2023-05-19 MED ORDER — SODIUM CHLORIDE 0.9 % IV SOLN
25.0000 mg | Freq: Four times a day (QID) | INTRAVENOUS | Status: DC | PRN
  Administered 2023-05-19 – 2023-05-22 (×7): 25 mg via INTRAVENOUS
  Filled 2023-05-19 (×2): qty 25
  Filled 2023-05-19: qty 1
  Filled 2023-05-19: qty 25
  Filled 2023-05-19 (×2): qty 1
  Filled 2023-05-19: qty 25
  Filled 2023-05-19 (×3): qty 1

## 2023-05-19 MED ORDER — CHLORHEXIDINE GLUCONATE CLOTH 2 % EX PADS
6.0000 | MEDICATED_PAD | Freq: Every day | CUTANEOUS | Status: DC
  Administered 2023-05-19 – 2023-05-25 (×7): 6 via TOPICAL

## 2023-05-19 MED ORDER — ACETAMINOPHEN 650 MG RE SUPP: 650.0000 mg | Freq: Four times a day (QID) | RECTAL | Status: DC | PRN

## 2023-05-19 MED ORDER — AMITRIPTYLINE HCL 25 MG PO TABS: 25.0000 mg | ORAL_TABLET | Freq: Every evening | ORAL | Status: DC | PRN

## 2023-05-19 MED ORDER — BOOST / RESOURCE BREEZE PO LIQD CUSTOM
1.0000 | Freq: Three times a day (TID) | ORAL | Status: DC
  Administered 2023-05-19 – 2023-05-25 (×9): 1 via ORAL

## 2023-05-19 NOTE — Progress Notes (Signed)
eLink Physician-Brief Progress Note Patient Name: Edwin Hall DOB: May 10, 1966 MRN: [id number]   Date of Service  05/19/2023  HPI/Events of Note  Patient transferred from the medical floor to the ICU for more aggressive Rx of intractable migraine headaches, however he does not appear overtly distressed.  eICU Interventions  New Patient Evaluation. Trial of PRN IV Dilaudid.        Edwin Hall 05/19/2023, 8:58 PM

## 2023-05-19 NOTE — Progress Notes (Signed)
Neurology Progress Note   S:// Patient seen and examined in his room.  There is no family in the room.  He states that he still has a 10 out of 10 headache and reports some nausea no vomiting. Per chart review soon after I examined him he had a sudden onset of chest pain nausea and hypertension at 200/119, EKG normal sinus rhythm patient was given 10 mg of IV hydralazine and 0.4 mg SL NTG with mild reduction in chest pain.  O:// Current vital signs: BP 122/81 (BP Location: Right Wrist)   Pulse 70   Temp 98.9 F (37.2 C) (Oral)   Resp 18   Ht 5\' 8"  (1.727 m)   Wt 56.8 kg Comment: bed scale  SpO2 100%   BMI 19.04 kg/m  Vital signs in last 24 hours: Temp:  [97.7 F (36.5 C)-98.9 F (37.2 C)] 98.9 F (37.2 C) (07/04 1106) Pulse Rate:  [64-116] 70 (07/04 1106) Resp:  [11-22] 18 (07/04 1106) BP: (122-200)/(77-132) 122/81 (07/04 1106) SpO2:  [97 %-100 %] 100 % (07/04 1106) Weight:  [54.4 kg-56.8 kg] 56.8 kg (07/04 1000)  GENERAL: Awake, alert in NAD HEENT: - Normocephalic and atraumatic, dry mm LUNGS - Clear to auscultation bilaterally with no wheezes CV - S1S2 RRR, no m/r/g, equal pulses bilaterally. ABDOMEN - Soft, nontender, nondistended with normoactive BS Ext: warm, well perfused, intact peripheral pulses, no edema   NEURO:  Mental Status: AA&Ox4 Language: speech is clesr  Naming, repetition, fluency, and comprehension intact. Cranial Nerves: PERRL  EOMI, visual fields full, no facial asymmetry, facial sensation intact, hearing intact, tongue/uvula/soft palate midline, normal sternocleidomastoid and trapezius muscle strength. No evidence of tongue atrophy or fibrillations Motor: 5/5 in all 4 extremities Tone: is normal and bulk is normal Sensation- Intact to light touch bilaterally Coordination: FTN intact bilaterally, no ataxia in BLE. Gait- deferred  Medications  Current Facility-Administered Medications:    0.9 %  sodium chloride infusion, , Intravenous, Continuous,  Rai, Ripudeep K, MD, Last Rate: 75 mL/hr at 05/19/23 0740, Restarted at 05/19/23 0740   acetaminophen (TYLENOL) tablet 650 mg, 650 mg, Oral, Q6H PRN **OR** acetaminophen (TYLENOL) suppository 650 mg, 650 mg, Rectal, Q6H PRN, Doutova, Anastassia, MD   amitriptyline (ELAVIL) tablet 25 mg, 25 mg, Oral, QHS PRN, Adela Glimpse, Anastassia, MD   butalbital-acetaminophen-caffeine (FIORICET) 50-325-40 MG per tablet 1 tablet, 1 tablet, Oral, Q6H PRN, Hillary Bow, DO, 1 tablet at 05/19/23 1024   Chlorhexidine Gluconate Cloth 2 % PADS 6 each, 6 each, Topical, Daily, Doutova, Anastassia, MD, 6 each at 05/19/23 0950   copper tablet 2 mg, 2 mg, Oral, Daily, Rai, Ripudeep K, MD   cyclobenzaprine (FLEXERIL) tablet 10 mg, 10 mg, Oral, TID PRN, Adela Glimpse, Anastassia, MD   famotidine (PEPCID) tablet 40 mg, 40 mg, Oral, BID, Doutova, Anastassia, MD, 40 mg at 05/19/23 1914   feeding supplement (BOOST / RESOURCE BREEZE) liquid 1 Container, 1 Container, Oral, TID BM, Rai, Ripudeep K, MD   fentaNYL (SUBLIMAZE) injection 12.5-50 mcg, 12.5-50 mcg, Intravenous, Q2H PRN, Doutova, Anastassia, MD, 50 mcg at 05/19/23 7829   hydrALAZINE (APRESOLINE) injection 10 mg, 10 mg, Intravenous, Q6H PRN, Rai, Ripudeep K, MD   metoprolol tartrate (LOPRESSOR) tablet 25 mg, 25 mg, Oral, BID, Rai, Ripudeep K, MD, 25 mg at 05/19/23 0927   montelukast (SINGULAIR) tablet 10 mg, 10 mg, Oral, Daily PRN, Therisa Doyne, MD   [START ON 05/20/2023] multivitamin with minerals tablet 1 tablet, 1 tablet, Oral, Daily, Rai, Delene Ruffini, MD  nitroGLYCERIN (NITROSTAT) 0.4 MG SL tablet, , , ,    Oral care mouth rinse, 15 mL, Mouth Rinse, PRN, Doutova, Anastassia, MD   pantoprazole (PROTONIX) EC tablet 40 mg, 40 mg, Oral, BID, Doutova, Anastassia, MD, 40 mg at 05/19/23 0927   promethazine (PHENERGAN) 25 mg in sodium chloride 0.9 % 50 mL IVPB, 25 mg, Intravenous, Q6H PRN, Rai, Ripudeep K, MD, Last Rate: 200 mL/hr at 05/19/23 0919, 25 mg at 05/19/23 0919    rosuvastatin (CRESTOR) tablet 20 mg, 20 mg, Oral, Daily, Doutova, Anastassia, MD, 20 mg at 05/19/23 1610  Facility-Administered Medications Ordered in Other Encounters:    sodium chloride flush (NS) 0.9 % injection 10 mL, 10 mL, Intracatheter, PRN, Loni Muse, MD, 10 mL at 09/15/22 1258  Labs CBC    Component Value Date/Time   WBC 6.9 05/19/2023 0615   RBC 4.33 05/19/2023 0615   HGB 13.9 05/19/2023 0615   HCT 40.1 05/19/2023 0615   PLT 151 05/19/2023 0615   MCV 92.6 05/19/2023 0615   MCH 32.1 05/19/2023 0615   MCHC 34.7 05/19/2023 0615   RDW 13.1 05/19/2023 0615   LYMPHSABS 1.5 05/10/2023 0648   MONOABS 0.5 05/10/2023 0648   EOSABS 0.0 05/10/2023 0648   BASOSABS 0.0 05/10/2023 0648    CMP     Component Value Date/Time   NA 134 (L) 05/19/2023 0615   K 4.2 05/19/2023 0615   CL 102 05/19/2023 0615   CO2 24 05/19/2023 0615   GLUCOSE 107 (H) 05/19/2023 0615   BUN 13 05/19/2023 0615   CREATININE 0.66 05/19/2023 0615   CREATININE 0.67 09/15/2022 1207   CALCIUM 8.1 (L) 05/19/2023 0615   PROT 5.0 (L) 05/19/2023 0615   ALBUMIN 2.7 (L) 05/19/2023 0615   AST 25 05/19/2023 0615   AST 26 09/15/2022 1207   ALT 38 05/19/2023 0615   ALT 33 09/15/2022 1207   ALKPHOS 53 05/19/2023 0615   BILITOT 0.7 05/19/2023 0615   BILITOT 1.0 09/15/2022 1207   GFRNONAA >60 05/19/2023 0615   GFRNONAA >60 09/15/2022 1207   GFRAA >60 06/26/2020 1737    Lipid Panel  No results found for: "CHOL", "TRIG", "HDL", "CHOLHDL", "VLDL", "LDLCALC", "LDLDIRECT"  No results found for: "HGBA1C"   Imaging I have reviewed images in epic and the results pertinent to this consultation are:  MRI examination of the brain no acute process MRV no dural sinus thrombosis  Assessment:  57 y.o. male who presents with status migrainosus. Third presentation to the ED and failed multiple headache cocktails. He was admitted for DHE but had chest pain after 1st dose and this was d/c'd. I discussed ketamine  transfusion for refractory pain with patient and he said this previously worked in combination with dilaudid and benadryl.  Recommendations: - Transfer to ICU; after he is moved will order ketamine infusion with prn dilaudid and benadryl  Bing Neighbors, MD [facility] [phone]  If 7pm- 7am, please page neurology on call as listed in AMION.

## 2023-05-19 NOTE — Assessment & Plan Note (Signed)
Pain seems to be initially difficult to control although patient is requesting Dilaudid and says that that does help with pain.  Original plan was to transfer patient to [facility] For DHE infusion. Given somewhat inconsistent history for right now we will see if able to control pain and hold off on DHE.  Appreciate neurology consult

## 2023-05-19 NOTE — Assessment & Plan Note (Signed)
Patient states only Phenergan helps allergic to Zofran and Reglan.

## 2023-05-19 NOTE — Consult Note (Signed)
NAME:  Edwin Hall, MRN:  [id number], DOB:  11/24/1965, LOS: 1 ADMISSION DATE:  05/18/2023, CONSULTATION DATE:  05/19/23 REFERRING MD:  Gevena Mart, NP, CHIEF COMPLAINT:  HA   History of Present Illness:   24 yoM with PMH of IBS, HLD, asthma, headache, barretts esophagus s/p partial esophagectomy c/b malabsorption syndrome who was admitted to [facility] 7/3 with 12 days of persistent migraine with bifrontal throbbing pain with blurred vision, nausea and vomiting unresponsive to multiple medications, two ER visits and referred back to ER by neurologist, followed by [facility].  This admit, Neurology consulted, but continued pain despite decadron, dilaudid, ativan, compazine, valproic acid and phenergan.  MRI brain and MR venogram negative.  DHE (dihydroergotamine) attempted but patient developed severe chest pain and hypertension.  Neurology consulting PCCM for transfer to ICU to start ketamine infusion for pain control.   Pertinent  Medical History   Past Medical History:  Diagnosis Date   Anxiety    Asthma    Barrett's esophagus    Blood transfusion without reported diagnosis    Complication of anesthesia    nasuea and vomiting   Food allergy    Headache    Hyperlipidemia    IBS (irritable bowel syndrome)    Iron deficiency anemia    Urticaria    Significant Hospital Events: Including procedures, antibiotic start and stop dates in addition to other pertinent events   4/3 admitted   Interim History / Subjective:   Objective   Blood pressure 120/85, pulse 73, temperature 99.1 F (37.3 C), temperature source Oral, resp. rate 17, height 5\' 8"  (1.727 m), weight 56.8 kg, SpO2 100 %.        Intake/Output Summary (Last 24 hours) at 05/19/2023 1800 Last data filed at 05/19/2023 1500 Gross per 24 hour  Intake 1727.83 ml  Output 1125 ml  Net 602.83 ml   Filed Weights   05/18/23 1445 05/19/23 1000  Weight: 54.4 kg 56.8 kg    Examination: General:  chronically ill appearing thin  male sitting upright in bed  HEENT: MM pink/moist, wearing glasses Neuro: awake, oriented, MAE CV: rr, NSR, no murmur, port right chest accessed  PULM:  non labored, CTA GI: soft, bs hypo Extremities: warm/dry, no LE edema  Skin: no rashes, pale  Labs reviewed   Resolved Hospital Problem list    Assessment & Plan:   Status migrainosus with intractable nausea and vomiting - tx to ICU for ketamine infusion per Neurology for pain control with dilaudid and prn benadryl.  Further per neurology - airway/ hemodynamic monitoring in ICU with frequent neuro checks - phenergan prn N/V - change NS to LR to 15ml/hr    Hypomagnesemia - Mag 2gm replete.  Recheck in am, goal > 2  HLD - statin   Atypical chest pain/ hypertension  - developed this morning, felt to be related to DHE, since stopped and vitals remains at baseline - negative cardiac workup thus far> EKG non acute, trop hs neg.  - Cont tele monitoring - cont metoprolol 25mg  BID   Moderate protein calorie malnutrition - cont copper, MVI - RD consult  Best Practice (right click and "Reselect all SmartList Selections" daily)   Diet/type: Regular consistency (see orders) DVT prophylaxis: SCD GI prophylaxis: H2B Lines: N/A Foley:  N/A Code Status:  full code Last date of multidisciplinary goals of care discussion [7/3]  Patient updated on plan of care  Labs   CBC: Recent Labs  Lab 05/16/23 0231 05/18/23 2213 05/19/23  0615  WBC  --  7.2 6.9  HGB 14.6 13.1 13.9  HCT 43.0 39.6 40.1  MCV  --  95.7 92.6  PLT  --  150 151    Basic Metabolic Panel: Recent Labs  Lab 05/16/23 0231 05/18/23 2213 05/19/23 0615 05/19/23 0616  NA 137 136 134*  --   K 3.6 4.0 4.2  --   CL 103 105 102  --   CO2  --  24 24  --   GLUCOSE 117* 114* 107*  --   BUN 18 12 13   --   CREATININE 0.70 0.65 0.66  --   CALCIUM  --  7.5* 8.1*  --   MG  --   --  1.6* 1.7  PHOS  --   --  3.8  --    GFR: Estimated Creatinine Clearance: 83.8  mL/min (by C-G formula based on SCr of 0.66 mg/dL). Recent Labs  Lab 05/18/23 2213 05/19/23 0615  WBC 7.2 6.9    Liver Function Tests: Recent Labs  Lab 05/18/23 2213 05/19/23 0615  AST 24 25  ALT 37 38  ALKPHOS 49 53  BILITOT 0.8 0.7  PROT 5.0* 5.0*  ALBUMIN 2.8* 2.7*   No results for input(s): "LIPASE", "AMYLASE" in the last 168 hours. No results for input(s): "AMMONIA" in the last 168 hours.  ABG    Component Value Date/Time   TCO2 26 05/16/2023 0231     Coagulation Profile: No results for input(s): "INR", "PROTIME" in the last 168 hours.  Cardiac Enzymes: Recent Labs  Lab 05/19/23 0616  CKTOTAL 39*    HbA1C: No results found for: "HGBA1C"  CBG: No results for input(s): "GLUCAP" in the last 168 hours.  Review of Systems:   Review of Systems  Constitutional:  Negative for chills and fever.  Eyes:  Positive for blurred vision.  Cardiovascular:  Negative for chest pain and leg swelling.  Gastrointestinal:  Positive for nausea and vomiting.  Neurological:  Positive for headaches. Negative for focal weakness and seizures.   Past Medical History:  He,  has a past medical history of Anxiety, Asthma, Barrett's esophagus, Blood transfusion without reported diagnosis, Complication of anesthesia, Food allergy, Headache, Hyperlipidemia, IBS (irritable bowel syndrome), Iron deficiency anemia, and Urticaria.   Surgical History:   Past Surgical History:  Procedure Laterality Date   ABDOMINAL SURGERY  12/2015   pylorus plasty   APPENDECTOMY  1984   BIOPSY  05/18/2020   Procedure: BIOPSY;  Surgeon: Kerin Salen, MD;  Location: [facility];  Service: Gastroenterology;;   CHOLECYSTECTOMY  2008   ESOPHAGOGASTRODUODENOSCOPY (EGD) WITH PROPOFOL N/A 05/18/2020   Procedure: ESOPHAGOGASTRODUODENOSCOPY (EGD) WITH PROPOFOL;  Surgeon: Kerin Salen, MD;  Location: [facility];  Service: Gastroenterology;  Laterality: N/A;   GASTROSTOMY TUBE PLACEMENT     HIATAL HERNIA REPAIR   07/2015   PARTIAL ESOPHAGECTOMY  12/10/2021   TALC PLEURODESIS     TONSILLECTOMY  1980   VENTRAL HERNIA REPAIR  2010     Social History:   reports that he has never smoked. He has never used smokeless tobacco. He reports that he does not drink alcohol and does not use drugs.   Family History:  His family history includes Angioedema in his mother; Dementia in his mother; Heart disease in his father; Lung cancer (age of onset: 70) in his brother; Parkinson's disease in his mother. There is no history of Allergic rhinitis, Asthma, Eczema, Immunodeficiency, or Urticaria.   Allergies Allergies  Allergen Reactions  Mushroom Extract Complex Anaphylaxis and Hives   Other Anaphylaxis    All seafood    Shellfish Allergy Anaphylaxis   Reglan [Metoclopramide] Hives    Severe itching all over   Zofran [Ondansetron Hcl] Itching and Rash    Patient states he develops a rash.   Tetracyclines & Related Itching     Home Medications  Prior to Admission medications   Medication Sig Start Date End Date Taking? Authorizing Provider  acetaminophen (TYLENOL) 500 MG tablet Take 1,000 mg by mouth every 6 (six) hours as needed for moderate pain.   Yes [provider]  amitriptyline (ELAVIL) 25 MG tablet Take 25 mg by mouth at bedtime as needed (migraines). 04/15/23  Yes [provider]  aspirin 81 MG EC tablet Take 81 mg by mouth at bedtime. Swallow whole.   Yes [provider]  atorvastatin (LIPITOR) 40 MG tablet Take 1 tablet by mouth at bedtime. 04/22/23  Yes [provider]  Cyanocobalamin (VITAMIN B 12 PO) Take 1,000 mcg by mouth daily.   Yes [provider]  cyclobenzaprine (FLEXERIL) 10 MG tablet Take 10 mg by mouth 3 (three) times daily as needed for muscle spasms. 07/05/22  Yes [provider]  famotidine (PEPCID) 40 MG tablet Take 40 mg by mouth 2 (two) times daily. 06/21/22  Yes [provider]  Ferrous Sulfate (IRON) 325 (65 Fe) MG TABS  Take 325 mg by mouth every other day.  05/12/20  Yes [provider]  ipratropium (ATROVENT) 0.06 % nasal spray USE TWO SPRAYS IN EACH NOSTRIL TWICE DAILY AS DIRECTED. Patient taking differently: Place 1 spray into both nostrils daily as needed for rhinitis. 05/22/20  Yes Padgett, Pilar Grammes, MD  lidocaine (XYLOCAINE) 2 % solution Use as directed 10 mLs in the mouth or throat every 6 (six) hours as needed (stomach pain). 11/27/22  Yes Cardama, Amadeo Garnet, MD  MAGNESIUM-OXIDE 400 (240 Mg) MG tablet Take 1 tablet by mouth 2 (two) times daily. 04/09/23  Yes [provider]  metoCLOPramide (REGLAN) 10 MG tablet Take 10 mg by mouth daily. 06/30/22  Yes [provider]  Multiple Vitamin (MULTIVITAMIN WITH MINERALS) TABS tablet Take 1 tablet by mouth daily.   Yes [provider]  pantoprazole (PROTONIX) 40 MG tablet Take 40 mg by mouth 2 (two) times daily.   Yes [provider]  rizatriptan (MAXALT) 10 MG tablet Take by mouth. 03/24/23  Yes [provider]  alum & mag hydroxide-simeth (MAALOX MAX) 400-400-40 MG/5ML suspension Take 10 mLs by mouth every 6 (six) hours as needed for indigestion. 11/27/22   Cardama, Amadeo Garnet, MD  chlorpheniramine-HYDROcodone (TUSSIONEX) 10-8 MG/5ML Take 5 mLs by mouth every 12 (twelve) hours as needed for cough. Patient not taking: Reported on 05/18/2023    [provider]  Cholecalciferol (VITAMIN D3) 10 MCG (400 UNIT) CAPS Take 400 Units by mouth daily.    [provider]  montelukast (SINGULAIR) 10 MG tablet Take 10 mg by mouth daily as needed (allergies). 12/11/21   [provider]  PROAIR DIGIHALER 108 (90 Base) MCG/ACT AEPB Take 2 puffs by mouth every 4 (four) hours as needed (sob/wheezing). 09/22/21   [provider]  Probiotic Product (PROBIOTIC ADVANCED) CAPS Take 1 capsule by mouth 2 (two) times daily.    [provider]  promethazine (PHENERGAN) 25 MG suppository Place  1 suppository (25 mg total) rectally every 6 (six) hours as needed for nausea or vomiting. 11/27/22   Cardama, Amadeo Garnet,  MD  promethazine (PHENERGAN) 25 MG tablet Take 25 mg by mouth every 6 (six) hours as needed for nausea or vomiting.    [provider]  rosuvastatin (CRESTOR) 20 MG tablet Take 20 mg by mouth at bedtime.    [provider]  sucralfate (CARAFATE) 1 g tablet Take 1 tablet (1 g total) by mouth 4 (four) times daily -  with meals and at bedtime. 10/17/20   Tilden Fossa, MD  traZODone (DESYREL) 50 MG tablet Take 50 mg by mouth at bedtime.    [provider]  Zinc 50 MG TABS Take 50 mg by mouth daily.     [provider]  azelastine (ASTELIN) 0.1 % nasal spray 2 sprays per nostril twice a day for control of nasal drainage. Patient not taking: Reported on 05/02/2020 11/27/19 05/02/20  Marcelyn Bruins, MD     Critical care time: 45 mins     Posey Boyer, MSN, NP, AG-ACNP-BC [facility] 05/19/2023, 6:38 PM  See Amion for pager If no response to pager , please call [phone] until 7pm After 7:00 pm call Elink  [phone]

## 2023-05-19 NOTE — Progress Notes (Signed)
eLink Physician-Brief Progress Note Patient Name: Edwin Hall DOB: 1965/12/13 MRN: [id number]   Date of Service  05/19/2023  HPI/Events of Note  Patient lying quietly in bed, he is not overtly distressed.  eICU Interventions  PRN IV Dilaudid ordered for his migraine headache.        Edwin Hall Edwin Hall 05/19/2023, 8:35 PM

## 2023-05-19 NOTE — Assessment & Plan Note (Signed)
Check prealbumin would benefit from nutritional consult 

## 2023-05-19 NOTE — Progress Notes (Addendum)
Initial Nutrition Assessment  DOCUMENTATION CODES:   Non-severe (moderate) malnutrition in context of chronic illness  INTERVENTION:  Liberalize diet to regular.  Will discontinue Ensure Enlive per pt request.  Provide Boost Breeze po TID, each supplement provides 250 kcal and 9 grams of protein.  Provide Carnation Breakfast Essentials in whole milk po BID  with meals, each supplement provides 290 kcal and 13 grams of protein.  Provided coupons to patient for Ensure products as no coupons available for Boost products at this time. Coupons could be used on Ensure Clear, which is similar to [employer].  Start copper 2 mg po daily. Recommend re-checking level in approximately 1 month and adjusting supplement as needed. Also recommended pt stop taking over-the-counter zinc supplement as this is likely leading to copper deficiency in setting of no other risk factors for copper deficiency identified. Will also reach out to [name] who it appears was ordering labs (pt thought it was his PCP).  Provide multivitamin with minerals po daily.  Send MD secure chat regarding pt request for social work consult in setting of food insecurity and difficulty affording bills.  NUTRITION DIAGNOSIS:   Moderate Malnutrition related to chronic illness (hx partial esophagectomy, inadequate oral intake, also affected by food insecurity) as evidenced by moderate fat depletion, moderate muscle depletion, severe muscle depletion.  GOAL:   Patient will meet greater than or equal to 90% of their needs  MONITOR:   PO intake, Supplement acceptance, Labs, Weight trends, I & O's  REASON FOR ASSESSMENT:   Malnutrition Screening Tool, Consult Assessment of nutrition requirement/status  ASSESSMENT:   57 year old male with PMHx of HLD, iron deficiency anemia, IBS, anxiety, asthma and PSHx of appendectomy 1984, cholecystectomy 2008, ventral hernia repairs 2010 and 2016, pylorus plasty 2017, hx G-tube  placement (now s/p removal), and most recently partial esophagectomy 11/2021 in setting of Barrett's esophagus with J-tube placement (now s/p J-tube removal) now admitted with status migrainosus.  Met with pt at bedside. He reports he has had difficulty with eating on and off throughout his life. He reports remote history of G-tube placement that was later removed. In January of 2023 patient had a partial esophagectomy in setting of Barrett's esophagus with J-tube placement. He reports this admission was complicated by septic shock and he was on the ventilator for several months. Reports at discharge took PO by mouth and then received nocturnal tube feeds via J-tube and also some occasional daytime feeds via J-tube if PO was poor. He reports J-tube has now been removed too (unsure exactly when removed but at least several months ago) as PO intake improved and tube was also causing him pain. Pt reports now he has a goal to eat 4 small meals per day and focuses on protein intake. He reports the biggest barrier to intake is food insecurity and difficulty affording food after paying bills. He reports SNAP benefits were reduced from $95 per month to $30 per month. He likes to drink Boost Breeze and [employer] to help meet calorie/protein needs but has difficulty affording these. Dislikes Ensure supplements and Molli Posey. Pt reports lately he has only been eating two meals per day as that is all he can afford. For breakfast he has steak egg and cheese bagel or breakfast sandwich. For his second meal of the day he may eat a frozen meal (meat loaf and potatoes or pizza or spaghetti) or may go to [employer] or [employer]. Pt reports allergy to mushrooms and  seafood/shellfish. He reports nausea and abdominal pain along with his migraine. He denies any difficulty with chewing or swallowing at this time. He reports he is able to pick out foods he can tolerate best from menu. Pt would benefit from  liberalization to regular diet. He would like to receive both Boost Breeze and [employer] supplements and requests Ensure be discontinued. Pt reports his PCP has been checking micronutrient levels (per chart review it appears labs were ordered by Dr. Judeth Porch at [facility]). RD asked if he had been supplemented on copper yet and pt reports he has not. He reports at home taking OTC zinc (reports for immune support), vitamin D3 1000 international units daily, MVI, and previously on OTC iron supplement but ran out recently. RD suspects OTC zinc supplement may be causing copper deficiency as no other risk factors for copper deficiency identified. Recommended stopping OTC zinc supplement. Will start oral copper supplement 2 mg/day and recommend continuing until level WNL. Will bring pt coupons for supplements at home. Pt also request social work consult in setting of food insecurity and difficulty with paying bills to see if there are rent assistance programs he would qualify for.  Pt reports he had not lost any weight prior to partial esophagectomy. He reports his UBW and weight prior to surgery in 11/2021 was 160 lbs. He reports he lost down to 98 lbs at time of discharge from that hospitalization. He reports he has now gained to 120 lbs. Admission wt appeared to be a stated weight. RD obtained bed scale weight of 56.8 kg (125.22 lbs). Recent weights in chart 55-57 kg over the past few months. Pt reports he walks around his apartment once a day when RD asked about mobility/ambulation.  Medications reviewed and include: famotidine, Ensure Enlive, pantoprazole, Crestor, NS at 75 mL/hour, Phenergan 25 mg every 6 hours PRN IV  Labs reviewed: Sodium 134  Micronutrient Profile: Folate: 30 WNL 09/15/22 Copper: 89 WNL 09/15/22, 40 L 01/03/23 Selenium: 151 WNL 01/03/23 Thiamine: 96.6 WNL 09/15/22 25-OH vitamin D: 54.67 WNL 09/15/22 Vitamin A: 49.6 WNL 09/15/22 Vitamin B12: 4981 H  09/15/22 Vitamin B6: 19.3 WNL 09/15/22 Vitamin E: 12.4 WNL 09/15/22 Zinc: 93 WNL 09/15/22, 73 WNL 01/03/23 Pt reports micronutrient labs are monitored by PCP. He reports he did not yet receive copper supplementation as level hadn't resulted yet at last appointment.  UOP: 475 mL documented yesterday (pt admitted <24 hours)  I/O: +815 mL since admission  Discussed with RN. Sent MD secure chat regarding pt request for social work consult.  NUTRITION - FOCUSED PHYSICAL EXAM:  Flowsheet Row Most Recent Value  Orbital Region Moderate depletion  Upper Arm Region Mild depletion  Thoracic and Lumbar Region No depletion  Buccal Region Moderate depletion  Temple Region Mild depletion  Clavicle Bone Region Moderate depletion  Clavicle and Acromion Bone Region Moderate depletion  Scapular Bone Region Moderate depletion  Dorsal Hand Moderate depletion  Patellar Region Severe depletion  Anterior Thigh Region Severe depletion  Posterior Calf Region Severe depletion  Edema (RD Assessment) None  Hair Reviewed  Eyes Reviewed  Mouth Reviewed  Skin Reviewed  Nails Reviewed       Diet Order:   Diet Order             Diet Heart Room service appropriate? Yes; Fluid consistency: Thin  Diet effective now                  EDUCATION NEEDS:  No education needs have been identified at this time  Skin:  Skin Assessment: Reviewed RN Assessment  Last BM:  05/18/23 per chart  Height:   Ht Readings from Last 1 Encounters:  05/18/23 5\' 8"  (1.727 m)   Weight:   Wt Readings from Last 1 Encounters:  05/19/23 56.8 kg   Ideal Body Weight:  70 kg  BMI:  Body mass index is 19.04 kg/m.  Estimated Nutritional Needs:   Kcal:  1800-2000  Protein:  90-100 grams  Fluid:  1.8-2 L/day  Letta Median, MS, RD, LDN, CNSC Pager number available on Amion

## 2023-05-19 NOTE — Progress Notes (Signed)
I agree with the Advanced Practitioner's note, impression, and recommendations as outlined. I have taken an independent interval history, reviewed the chart and examined the patient.  My medical decision making is as follows:   Subjective: 57 yo with refractory migraines as outpatient admitted for pain control. Has been refractory to DHE, phenergan, benadryl, toradol, triptans, fiorcet, the usual abortive therapies.   Objective: Blood pressure 120/85, pulse 73, temperature 99.1 F (37.3 C), temperature source Oral, resp. rate 17, height 5\' 8"  (1.727 m), weight 56.8 kg, SpO2 100 %.  Laying flat comfortably No distress Breath sounds clear, breathing nonlabored, no wheeze Right chest wall port Abdomen soft No edema   Labs/Imaging: Na 134 Cr 0.66 Hgb 13.9  Assessment and Plan:  Status migrainosus History of barrett's esophagus s/p partial esophagectomy with subsequent protein calorie malnutrition(prolonged hospital stay at [facility] in 2023 noted)  Plan is to admit to ICU for ketamine infusion for analgesia, with benadryl and dilaudid per neurology.  He will need icu level of care for monitoring this medication.     The patient is critically ill due to status migrainosus with multiple organ systems failure and requires high complexity decision making for assessment and support, frequent evaluation and titration of therapies, application of advanced monitoring technologies and extensive interpretation of multiple databases.   Critical Care Time devoted to patient care services described in this note is 32 minutes. This time reflects time of care of this signee Charlott Holler . This critical care time does not reflect separately billable procedures or procedure time, teaching time and supervisory time of PA/NP/Med student/Med Resident etc but could involve care discussion time.  Charlott Holler [facility] 05/19/2023 6:35 PM  Pager: see AMION  If no response  to pager, please call critical care on call (see AMION) until 7pm After 7:00 pm call Elink

## 2023-05-19 NOTE — Assessment & Plan Note (Signed)
Continue Crestor 20 mg a day

## 2023-05-19 NOTE — Consult Note (Signed)
NEUROLOGY CONSULTATION NOTE   Date of service: May 19, 2023 Patient Name: Edwin Hall MRN:  [id number] DOB:  [birth year] Reason for consult: "Headache" Requesting Provider: Therisa Doyne, MD _ _ _   _ __   _ __ _ _  __ __   _ __   __ _  History of Present Illness  Edwin Hall is a 57 y.o. male with PMH significant for HLD, headaches, who presents with 12 days hx of persistent bifrontal throbbing headache. Endorses Blurred vision which is typical of his migraine headache. He has tried and failed multiple medications including fioricet, imitrex. He has been to the ED twice and seen his neurologist outpatient and was directed back to the ED for intractable migraine headache.  He had MRI Arlys John and MRV which was negative for any acute intracranial abnormalities.  In the ED, tried a migraine cocktail again with decadron, dilaudid, ativan, compazine, valproic acid and phenergan with little to no relief.  He has not taken any imitrex for the last month. He denies any history of ischemic heart disease.   ROS   Constitutional Denies weight loss, fever and chills.   HEENT Denies changes in vision and hearing.   Respiratory Denies SOB and cough.   CV Denies palpitations and CP   GI Denies abdominal pain, nausea, vomiting and diarrhea.   GU Denies dysuria and urinary frequency.   MSK Denies myalgia and joint pain.   Skin Denies rash and pruritus.   Neurological + headache but no syncope.   Psychiatric Denies recent changes in mood. Denies anxiety and depression.    Past History   Past Medical History:  Diagnosis Date  . Anxiety   . Asthma   . Barrett's esophagus   . Blood transfusion without reported diagnosis   . Complication of anesthesia    nasuea and vomiting  . Food allergy   . Headache   . Hyperlipidemia   . IBS (irritable bowel syndrome)   . Iron deficiency anemia   . Urticaria    Past Surgical History:  Procedure Laterality Date  . ABDOMINAL SURGERY  12/2015    pylorus plasty  . APPENDECTOMY  1984  . BIOPSY  05/18/2020   Procedure: BIOPSY;  Surgeon: Kerin Salen, MD;  Location: [facility];  Service: Gastroenterology;;  . Ephriam Knuckles  . ESOPHAGOGASTRODUODENOSCOPY (EGD) WITH PROPOFOL N/A 05/18/2020   Procedure: ESOPHAGOGASTRODUODENOSCOPY (EGD) WITH PROPOFOL;  Surgeon: Kerin Salen, MD;  Location: [facility];  Service: Gastroenterology;  Laterality: N/A;  . GASTROSTOMY TUBE PLACEMENT    . HIATAL HERNIA REPAIR  07/2015  . PARTIAL ESOPHAGECTOMY  12/10/2021  . TALC PLEURODESIS    . TONSILLECTOMY  1980  . VENTRAL HERNIA REPAIR  2010   Family History  Problem Relation Age of Onset  . Parkinson's disease Mother   . Angioedema Mother   . Dementia Mother   . Heart disease Father   . Lung cancer Brother 6       non-smoker  . Allergic rhinitis Neg Hx   . Asthma Neg Hx   . Eczema Neg Hx   . Immunodeficiency Neg Hx   . Urticaria Neg Hx    Social History   Socioeconomic History  . Marital status: Single    Spouse name: Not on file  . Number of children: Not on file  . Years of education: Not on file  . Highest education level: Not on file  Occupational History  . Not on file  Tobacco Use  .  Smoking status: Never  . Smokeless tobacco: Never  Vaping Use  . Vaping Use: Never used  Substance and Sexual Activity  . Alcohol use: No  . Drug use: No  . Sexual activity: Not Currently  Other Topics Concern  . Not on file  Social History Narrative  . Not on file   Social Determinants of Health   Financial Resource Strain: Not on file  Food Insecurity: Food Insecurity Present (05/19/2023)   Hunger Vital Sign   . Worried About Programme researcher, broadcasting/film/video in the Last Year: Often true   . Ran Out of Food in the Last Year: Often true  Transportation Needs: No Transportation Needs (05/19/2023)   PRAPARE - Transportation   . Lack of Transportation (Medical): No   . Lack of Transportation (Non-Medical): No  Physical Activity: Not on file   Stress: Not on file  Social Connections: Not on file   Allergies  Allergen Reactions  . Mushroom Extract Complex Anaphylaxis and Hives  . Other Anaphylaxis    All seafood   . Shellfish Allergy Anaphylaxis  . Reglan [Metoclopramide] Hives    Severe itching all over  . Zofran [Ondansetron Hcl] Itching and Rash    Patient states he develops a rash.  . Tetracyclines & Related Itching    Medications   Medications Prior to Admission  Medication Sig Dispense Refill Last Dose  . acetaminophen (TYLENOL) 500 MG tablet Take 1,000 mg by mouth every 6 (six) hours as needed for moderate pain.   unknown  . amitriptyline (ELAVIL) 25 MG tablet Take 25 mg by mouth at bedtime as needed (migraines).   unknown  . aspirin 81 MG EC tablet Take 81 mg by mouth at bedtime. Swallow whole.   05/17/2023  . atorvastatin (LIPITOR) 40 MG tablet Take 1 tablet by mouth at bedtime.   05/17/2023  . Cyanocobalamin (VITAMIN B 12 PO) Take 1,000 mcg by mouth daily.   05/18/2023  . cyclobenzaprine (FLEXERIL) 10 MG tablet Take 10 mg by mouth 3 (three) times daily as needed for muscle spasms.   05/17/2023  . famotidine (PEPCID) 40 MG tablet Take 40 mg by mouth 2 (two) times daily.   05/18/2023  . Ferrous Sulfate (IRON) 325 (65 Fe) MG TABS Take 325 mg by mouth every other day.    05/18/2023  . ipratropium (ATROVENT) 0.06 % nasal spray USE TWO SPRAYS IN EACH NOSTRIL TWICE DAILY AS DIRECTED. (Patient taking differently: Place 1 spray into both nostrils daily as needed for rhinitis.) 15 mL 0 unknown  . lidocaine (XYLOCAINE) 2 % solution Use as directed 10 mLs in the mouth or throat every 6 (six) hours as needed (stomach pain). 100 mL 0 unknown  . MAGNESIUM-OXIDE 400 (240 Mg) MG tablet Take 1 tablet by mouth 2 (two) times daily.   05/18/2023  . metoCLOPramide (REGLAN) 10 MG tablet Take 10 mg by mouth daily.   05/18/2023  . Multiple Vitamin (MULTIVITAMIN WITH MINERALS) TABS tablet Take 1 tablet by mouth daily.   05/18/2023  . pantoprazole  (PROTONIX) 40 MG tablet Take 40 mg by mouth 2 (two) times daily.   05/18/2023  . rizatriptan (MAXALT) 10 MG tablet Take by mouth.     Marland Kitchen alum & mag hydroxide-simeth (MAALOX MAX) 400-400-40 MG/5ML suspension Take 10 mLs by mouth every 6 (six) hours as needed for indigestion. 355 mL 0   . chlorpheniramine-HYDROcodone (TUSSIONEX) 10-8 MG/5ML Take 5 mLs by mouth every 12 (twelve) hours as needed for cough. (Patient not taking:  Reported on 05/18/2023)   Not Taking  . Cholecalciferol (VITAMIN D3) 10 MCG (400 UNIT) CAPS Take 400 Units by mouth daily.     . montelukast (SINGULAIR) 10 MG tablet Take 10 mg by mouth daily as needed (allergies).     Marland Kitchen PROAIR DIGIHALER 108 (90 Base) MCG/ACT AEPB Take 2 puffs by mouth every 4 (four) hours as needed (sob/wheezing).     . Probiotic Product (PROBIOTIC ADVANCED) CAPS Take 1 capsule by mouth 2 (two) times daily.     . promethazine (PHENERGAN) 25 MG suppository Place 1 suppository (25 mg total) rectally every 6 (six) hours as needed for nausea or vomiting. 12 each 0   . promethazine (PHENERGAN) 25 MG tablet Take 25 mg by mouth every 6 (six) hours as needed for nausea or vomiting.     . rosuvastatin (CRESTOR) 20 MG tablet Take 20 mg by mouth at bedtime.     . sucralfate (CARAFATE) 1 g tablet Take 1 tablet (1 g total) by mouth 4 (four) times daily -  with meals and at bedtime. 42 tablet 0   . traZODone (DESYREL) 50 MG tablet Take 50 mg by mouth at bedtime.     . Zinc 50 MG TABS Take 50 mg by mouth daily.         Vitals   Vitals:   05/18/23 2151 05/19/23 0155 05/19/23 0200 05/19/23 0308  BP:  (!) 159/132 (!) 135/97 (!) 146/93  Pulse:  97 100 99  Resp:  17 17 17   Temp: 98.3 F (36.8 C) 97.7 F (36.5 C)  97.9 F (36.6 C)  TempSrc: Oral Oral  Oral  SpO2:   100% 100%  Weight:      Height:         Body mass index is 18.25 kg/m.  Physical Exam   General: Laying comfortably in bed; in no acute distress.  HENT: Normal oropharynx and mucosa. Normal external  appearance of ears and nose.  Neck: Supple, no pain or tenderness  CV: No JVD. No peripheral edema.  Pulmonary: Symmetric Chest rise. Normal respiratory effort.  Abdomen: Soft to touch, non-tender.  Ext: No cyanosis, edema, or deformity  Skin: No rash. Normal palpation of skin.   Musculoskeletal: Normal digits and nails by inspection. No clubbing.   Neurologic Examination  Mental status/Cognition: Alert, oriented to self, place, month and year, good attention.  Speech/language: Fluent, comprehension intact, object naming intact, repetition intact.  Cranial nerves:   CN II Pupils equal and reactive to light, no VF deficits    CN III,IV,VI EOM intact, no gaze preference or deviation, no nystagmus    CN V normal sensation in V1, V2, and V3 segments bilaterally    CN VII no asymmetry, no nasolabial fold flattening    CN VIII normal hearing to speech    CN IX & X normal palatal elevation, no uvular deviation    CN XI 5/5 head turn and 5/5 shoulder shrug bilaterally    CN XII midline tongue protrusion    Motor:  Muscle bulk: normal, tone normal, pronator drift none tremor none Mvmt Root Nerve  Muscle Right Left Comments  SA C5/6 Ax Deltoid 5 5   EF C5/6 Mc Biceps 5 5   EE C6/7/8 Rad Triceps 5 5   WF C6/7 Med FCR     WE C7/8 PIN ECU     F Ab C8/T1 U ADM/FDI 5 5   HF L1/2/3 Fem Illopsoas 5 5   KE L2/3/4 Fem  Quad 5 5   DF L4/5 D Peron Tib Ant 5 5   PF S1/2 Tibial Grc/Sol 5 5    Sensation:  Light touch Mildly decreased to touch on the right   Pin prick    Temperature    Vibration   Proprioception    Coordination/Complex Motor:  - Finger to Nose intact BL - Heel to shin intact BL - Rapid alternating movement are normal. - Gait: deferred.  Labs   CBC:  Recent Labs  Lab 05/16/23 0231 05/18/23 2213  WBC  --  7.2  HGB 14.6 13.1  HCT 43.0 39.6  MCV  --  95.7  PLT  --  150    Basic Metabolic Panel:  Lab Results  Component Value Date   NA 136 05/18/2023   K 4.0  05/18/2023   CO2 24 05/18/2023   GLUCOSE 114 (H) 05/18/2023   BUN 12 05/18/2023   CREATININE 0.65 05/18/2023   CALCIUM 7.5 (L) 05/18/2023   GFRNONAA >60 05/18/2023   GFRAA >60 06/26/2020   Lipid Panel: No results found for: "LDLCALC" HgbA1c: No results found for: "HGBA1C" Urine Drug Screen:     Component Value Date/Time   LABOPIA POSITIVE (A) 10/04/2020 1015   COCAINSCRNUR NONE DETECTED 10/04/2020 1015   LABBENZ NONE DETECTED 10/04/2020 1015   AMPHETMU NONE DETECTED 10/04/2020 1015   THCU NONE DETECTED 10/04/2020 1015   LABBARB NONE DETECTED 10/04/2020 1015    Alcohol Level No results found for: "ETH"  MRI brain with and without contrast(Personally reviewed): No acute abnormalities.  MR Venogram head with and without contrast(Personally reviewed): No dural venous sinus thrombosis.  Impression   KENE WOOLDRIDGE is a 57 y.o. male who presents with status migrainosus. Third presentation to the ED and failed multiple headache cocktails.  Recommendations  - IV phenergan x 1 along with fluids. Will wait 30 mins after that and give 0.5mg  DHE as a test dose. If tolerates fine, will give an additional 0.5mg  of DHE and then do DHE 0.5mg  Q8H along with phenergan IV. - Telemetry and monitor for nausea/vomiting, chest pain and hypertension. - we will continue to follow along. ______________________________________________________________________   Thank you for the opportunity to take part in the care of this patient. If you have any further questions, please contact the neurology consultation attending.  Signed,  Erick Blinks [facility] _ _ _   _ __   _ __ _ _  __ __   _ __   __ _

## 2023-05-19 NOTE — Progress Notes (Addendum)
PT complaining of sudden onset chest pressure located sternal radiating left lateral. Rates a 10/10. Has had nausea w/o vomiting prior to onset. Skin remains warm and dry. Does endorse increased pain/pressure with respiration. With onset of symptoms PT's B/P climbed from 133/92 to 200/119 and consistently hypertensive with cuff reposition. 12 lead ECG was obtained which noted "Normal Sinus Rhythm. Possible inferior infarct, age undetermined, abnormal ECG". No STEMI or ST segment changes noted. Dr. Isidoro Donning was at bedside to evaluate. PT was given 10mg  IV Hydralazine and one SL NTG 0.4mg  with pain reduction down to 07/10. Troponin=8 and D. Dimer was a high normal 0.49.

## 2023-05-19 NOTE — Progress Notes (Signed)
[facility]                                                                              Edwin Hall, is a 57 y.o. male, DOB - 1965-12-24, YNW:[id number] Admit date - 05/18/2023    Outpatient Primary MD for the patient is Nodal, Joline Salt, PA-C  LOS - 1  days  Chief Complaint  Patient presents with   Migraine   Blurred Vision   Nausea   Emesis       Brief summary   Patient is a 57 year old male with IBS, HLP, asthma, history of headaches, malabsorption, pneumothorax, partial esophagectomy in 11/2021 secondary to Barrett's esophagus presented with intractable headaches.  Patient follows neurology outpatient, Dr. Earna Coder Ward at [facility].  Per patient, he was hospitalized at [facility] in 11/2021 for partial esophagectomy, complicated with septic shock requiring ventilation, subsequent tracheostomy and has made recovery from this.  While recovering in the hospital, his headaches started and has become progressive in severity and frequency.  Per outpatient neurology note, patient has tried and failed multiple abortive therapies including Imitrex, Maxalt, Fioricet.  Flexeril and Phenergan works somewhat. Patient presented to ED with 12 days history of persistent bifrontal throbbing headache with blurred vision, typical of his migraine headache.  He was seen in ED twice and by his neurologist outpatient and was directed back to ED for intractable migraine headache. In ED, tried migraine daily with Decadron, Dilaudid, Ativan, Compazine, valproic acid and Phenergan with no significant relief. MRI brain and MRV negative for any acute intracranial abnormality.  Neurology was consulted and patient was transferred to [facility] for further workup.   Assessment & Plan    Principal Problem:   Status migrainosus -Complicated history as above, follows outpatient with Dr. Earna Coder Ward at [facility].  Has tried and failed multiple abortive therapies including Imitrex, Maxalt,  Fioricet. -Neurology consulted, received IV Phenergan, fluids, DHE -This morning noted to have hypertensive urgency with chest pain, per neurology, no further DHE -Will await further recommendations from neurology   Active Problems: Atypical chest pain with hypertensive urgency -Patient examined this morning, BP noted to be 190/111, HR in 80s, complaining of 10/10 chest pain, mid sternal/ left, no radiation, no palpitation or diaphoresis.?  Acute anxiety versus DHE -Stat EKG showed no acute ST-T wave changes suggestive of ischemia, has old Q waves in 2 3 aVF, troponin 8, CK 39, D-dimer 0.49 -Given nitroglycerin s/l, started on metoprolol 25 mg twice daily for BP -Pain control, antiemetics, continue PPI    Intractable nausea and vomiting -Continue Phenergan IV as needed   Hyperlipidemia -Continue statin  Hypomagnesemia -Replaced  Moderate protein calorie malnutrition -Etiology: chronic illness (hx partial esophagectomy, inadequate oral intake, also affected by food insecurity) Signs/Symptoms: moderate fat depletion, moderate muscle depletion, severe muscle depletion Estimated body mass index is 19.04 kg/m as calculated from the following:   Height as of this encounter: 5\' 8"  (1.727 m).   Weight as of this encounter: 56.8 kg.  Code Status: Full CODE STATUS DVT Prophylaxis:  SCDs Start: 05/19/23 0152   Level of Care: Level of care: Progressive Family Communication: Updated patient  Disposition Plan:      Remains inpatient appropriate:      Procedures:  MRI brain, MRV  Consultants:   Neurology  Antimicrobials:  Medications  Chlorhexidine Gluconate Cloth  6 each Topical Daily   copper  2 mg Oral Daily   famotidine  40 mg Oral BID   feeding supplement  1 Container Oral TID BM   metoprolol tartrate  25 mg Oral BID   [START ON 05/20/2023] multivitamin with minerals  1 tablet Oral Daily   nitroGLYCERIN       pantoprazole  40 mg Oral BID   rosuvastatin  20 mg Oral Daily       Subjective:   Edwin Hall was seen and examined today.  At the time of examination complaining of chest pain 10/10.  No dizziness, lightheadedness, diaphoresis.  Appears somewhat anxious.  Still complaining of headache and blurry vision.    Objective:   Vitals:   05/19/23 0824 05/19/23 0922 05/19/23 1000 05/19/23 1106  BP: 129/77 136/80  122/81  Pulse: (!) 101 (!) 107  70  Resp: 19 19  18   Temp: 98.7 F (37.1 C)   98.9 F (37.2 C)  TempSrc: Oral   Oral  SpO2: 100% 100%  100%  Weight:   56.8 kg   Height:        Intake/Output Summary (Last 24 hours) at 05/19/2023 1208 Last data filed at 05/19/2023 0920 Gross per 24 hour  Intake 2606.16 ml  Output 975 ml  Net 1631.16 ml     Wt Readings from Last 3 Encounters:  05/19/23 56.8 kg  05/16/23 55.3 kg  05/10/23 57.2 kg     Exam General: Alert and oriented x 3, appears uncomfortable Cardiovascular: S1 S2 auscultated,  RRR Respiratory: Clear to auscultation bilaterally, no wheezing Gastrointestinal: Soft, nontender, nondistended, + bowel sounds Ext: no pedal edema bilaterally Neuro: Strength 5/5 upper and lower extremities bilaterally Psych: appears anxious   Data Reviewed:  I have personally reviewed following labs    CBC Lab Results  Component Value Date   WBC 6.9 05/19/2023   RBC 4.33 05/19/2023   HGB 13.9 05/19/2023   HCT 40.1 05/19/2023   MCV 92.6 05/19/2023   MCH 32.1 05/19/2023   PLT 151 05/19/2023   MCHC 34.7 05/19/2023   RDW 13.1 05/19/2023   LYMPHSABS 1.5 05/10/2023   MONOABS 0.5 05/10/2023   EOSABS 0.0 05/10/2023   BASOSABS 0.0 05/10/2023     Last metabolic panel Lab Results  Component Value Date   NA 134 (L) 05/19/2023   K 4.2 05/19/2023   CL 102 05/19/2023   CO2 24 05/19/2023   BUN 13 05/19/2023   CREATININE 0.66 05/19/2023   GLUCOSE 107 (H) 05/19/2023   GFRNONAA >60 05/19/2023   GFRAA >60 06/26/2020   CALCIUM 8.1 (L) 05/19/2023   PHOS 3.8 05/19/2023   PROT 5.0 (L) 05/19/2023    ALBUMIN 2.7 (L) 05/19/2023   BILITOT 0.7 05/19/2023   ALKPHOS 53 05/19/2023   AST 25 05/19/2023   ALT 38 05/19/2023   ANIONGAP 8 05/19/2023    CBG (last 3)  No results for input(s): "GLUCAP" in the last 72 hours.    Coagulation Profile: No results for input(s): "INR", "PROTIME" in the last 168 hours.   Radiology Studies: I have personally reviewed the imaging studies  MR Brain W and Wo Contrast  Result Date: 05/18/2023 CLINICAL DATA:  Migraines, nausea vomiting EXAM: MRI HEAD WITHOUT AND WITH CONTRAST MR VENOGRAM HEAD WITHOUT AND WITH  CONTRAST TECHNIQUE: Multiplanar, multi-echo pulse sequences of the brain and surrounding structures were acquired without and with intravenous contrast. Angiographic images of the intracranial venous structures were acquired using MRV technique without and with intravenous contrast. CONTRAST:  5.24mL GADAVIST GADOBUTROL 1 MMOL/ML IV SOLN COMPARISON:  No prior MRI available, correlation is made with 05/16/2023 CTA head and neck FINDINGS: Evaluation is somewhat limited by motion artifact. MRI HEAD WITHOUT AND WITH CONTRAST Brain: No restricted diffusion to suggest acute or subacute infarct. No abnormal parenchymal or meningeal enhancement. No acute hemorrhage, mass, mass effect, or midline shift. No hydrocephalus or extra-axial collection. Normal pituitary and craniocervical junction. No hemosiderin deposition to suggest remote hemorrhage. Normal cerebral volume for age. 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 Scattered and confluent T2 hyperintense signal in the periventricular white matter, likely the sequela of mild-to-moderate chronic small vessel ischemic disease. Vascular: Normal arterial flow voids. Normal arterial and venous enhancement. Skull and upper cervical spine: Normal marrow signal. Sinuses/Orbits: Clear paranasal sinuses. No acute finding in the orbits. Other: The mastoid air cells are well aerated. MR VENOGRAM HEAD WITHOUT AND WITH CONTRAST  There is no evidence of dural venous sinus or deep cerebral vein thrombosis. No dural venous sinus stenosis. IMPRESSION: 1. No acute intracranial process. No evidence of acute or subacute infarct. 2. No evidence of dural venous sinus thrombosis or stenosis. Electronically Signed   By: Wiliam Ke M.D.   On: 05/18/2023 19:46   MR MRV HEAD W WO CONTRAST  Result Date: 05/18/2023 CLINICAL DATA:  Migraines, nausea vomiting EXAM: MRI HEAD WITHOUT AND WITH CONTRAST MR VENOGRAM HEAD WITHOUT AND WITH CONTRAST TECHNIQUE: Multiplanar, multi-echo pulse sequences of the brain and surrounding structures were acquired without and with intravenous contrast. Angiographic images of the intracranial venous structures were acquired using MRV technique without and with intravenous contrast. CONTRAST:  5.24mL GADAVIST GADOBUTROL 1 MMOL/ML IV SOLN COMPARISON:  No prior MRI available, correlation is made with 05/16/2023 CTA head and neck FINDINGS: Evaluation is somewhat limited by motion artifact. MRI HEAD WITHOUT AND WITH CONTRAST Brain: No restricted diffusion to suggest acute or subacute infarct. No abnormal parenchymal or meningeal enhancement. No acute hemorrhage, mass, mass effect, or midline shift. No hydrocephalus or extra-axial collection. Normal pituitary and craniocervical junction. No hemosiderin deposition to suggest remote hemorrhage. Normal cerebral volume for age. 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 Scattered and confluent T2 hyperintense signal in the periventricular white matter, likely the sequela of mild-to-moderate chronic small vessel ischemic disease. Vascular: Normal arterial flow voids. Normal arterial and venous enhancement. Skull and upper cervical spine: Normal marrow signal. Sinuses/Orbits: Clear paranasal sinuses. No acute finding in the orbits. Other: The mastoid air cells are well aerated. MR VENOGRAM HEAD WITHOUT AND WITH CONTRAST There is no evidence of dural venous sinus or deep cerebral  vein thrombosis. No dural venous sinus stenosis. IMPRESSION: 1. No acute intracranial process. No evidence of acute or subacute infarct. 2. No evidence of dural venous sinus thrombosis or stenosis. Electronically Signed   By: Wiliam Ke M.D.   On: 05/18/2023 19:46       Val Schiavo M.D. [facility] 05/19/2023, 12:08 PM  Available via Epic secure chat 7am-7pm After 7 pm, please refer to night coverage provider listed on amion.

## 2023-05-20 DIAGNOSIS — E785 Hyperlipidemia, unspecified: Secondary | ICD-10-CM | POA: Diagnosis not present

## 2023-05-20 DIAGNOSIS — R072 Precordial pain: Secondary | ICD-10-CM | POA: Diagnosis not present

## 2023-05-20 DIAGNOSIS — G43901 Migraine, unspecified, not intractable, with status migrainosus: Secondary | ICD-10-CM | POA: Diagnosis not present

## 2023-05-20 DIAGNOSIS — G43E11 Chronic migraine with aura, intractable, with status migrainosus: Secondary | ICD-10-CM | POA: Diagnosis not present

## 2023-05-20 LAB — CBC
HCT: 42.4 % (ref 39.0–52.0)
Hemoglobin: 14 g/dL (ref 13.0–17.0)
MCH: 31.1 pg (ref 26.0–34.0)
MCHC: 33 g/dL (ref 30.0–36.0)
MCV: 94.2 fL (ref 80.0–100.0)
Platelets: 144 10*3/uL — ABNORMAL LOW (ref 150–400)
RBC: 4.5 MIL/uL (ref 4.22–5.81)
RDW: 13.4 % (ref 11.5–15.5)
WBC: 6.4 10*3/uL (ref 4.0–10.5)
nRBC: 0 % (ref 0.0–0.2)

## 2023-05-20 LAB — BASIC METABOLIC PANEL
Anion gap: 5 (ref 5–15)
BUN: 13 mg/dL (ref 6–20)
CO2: 25 mmol/L (ref 22–32)
Calcium: 7.7 mg/dL — ABNORMAL LOW (ref 8.9–10.3)
Chloride: 107 mmol/L (ref 98–111)
Creatinine, Ser: 0.72 mg/dL (ref 0.61–1.24)
GFR, Estimated: >60 mL/min (ref >60)
Glucose, Bld: 104 mg/dL — ABNORMAL HIGH (ref 70–99)
Potassium: 3.7 mmol/L (ref 3.5–5.1)
Sodium: 137 mmol/L (ref 135–145)

## 2023-05-20 LAB — MAGNESIUM: Magnesium: 1.9 mg/dL (ref 1.7–2.4)

## 2023-05-20 MED ORDER — DIPHENHYDRAMINE HCL 50 MG/ML IJ SOLN
25.0000 mg | Freq: Once | INTRAMUSCULAR | Status: AC
  Administered 2023-05-20: 25 mg via INTRAVENOUS
  Filled 2023-05-20: qty 1

## 2023-05-20 MED ORDER — POLYVINYL ALCOHOL 1.4 % OP SOLN: 2.0000 [drp] | OPHTHALMIC | Status: DC | PRN

## 2023-05-20 MED ORDER — SODIUM CHLORIDE 0.9 % IV BOLUS
250.0000 mL | Freq: Once | INTRAVENOUS | Status: AC
  Administered 2023-05-20: 250 mL via INTRAVENOUS

## 2023-05-20 MED ORDER — ATORVASTATIN CALCIUM 40 MG PO TABS
40.0000 mg | ORAL_TABLET | Freq: Every day | ORAL | Status: DC
  Administered 2023-05-20 – 2023-05-25 (×6): 40 mg via ORAL
  Filled 2023-05-20 (×6): qty 1

## 2023-05-20 MED ORDER — HYDROMORPHONE HCL 1 MG/ML IJ SOLN
0.5000 mg | Freq: Once | INTRAMUSCULAR | Status: AC
  Administered 2023-05-20: 0.5 mg via INTRAVENOUS
  Filled 2023-05-20: qty 0.5

## 2023-05-20 MED ORDER — MAGNESIUM SULFATE 2 GM/50ML IV SOLN
2.0000 g | Freq: Once | INTRAVENOUS | Status: AC
  Administered 2023-05-20: 2 g via INTRAVENOUS
  Filled 2023-05-20: qty 50

## 2023-05-20 MED ORDER — KETOROLAC TROMETHAMINE 15 MG/ML IJ SOLN
15.0000 mg | Freq: Once | INTRAMUSCULAR | Status: AC
  Administered 2023-05-20: 15 mg via INTRAVENOUS
  Filled 2023-05-20: qty 1

## 2023-05-20 NOTE — Progress Notes (Signed)
eLink Physician-Brief Progress Note Patient Name: SATHYA SCHLICHER DOB: 1966/08/26 MRN: [id number]   Date of Service  05/20/2023  HPI/Events of Note  Patient in ICU on ketamine for refractory migraine.  RN reports agitation and confusion of sudden onset.  eICU Interventions  Camera exam done.  Patient calm and cooperative.  RN states agitation and confusion appears to have spontaneously resolved.  No intervention for now.  Will reassess if it occurs again.     Intervention Category Intermediate Interventions: Change in mental status - evaluation and management  Carilyn Goodpasture 05/20/2023, 9:34 PM

## 2023-05-20 NOTE — TOC Initial Note (Signed)
Transition of Care [facility]) - Initial/Assessment Note    Patient Details  Name: Edwin Hall MRN: [id number] Date of Birth: March 11, 1966  Transition of Care [facility]) CM/SW Contact:    Lorri Frederick, LCSW Phone Number: 05/20/2023, 12:00 PM  Clinical Narrative:     CSW met with pt for initial assessment, introduced self and explained TOC role.  Pt from home alone, no current services.  Pt independent prior to admission, ambulatory, drives.   Pt expressing SDOH needs for food and financial assistance.  See separate note.  TOC will continue to follow for DC needs.                Barriers to Discharge: Continued Medical Work up   Patient Goals and CMS Choice            Expected Discharge Plan and Services In-house Referral: Clinical Social Work     Living arrangements for the past 2 months: Single Family Home                                      Prior Living Arrangements/Services Living arrangements for the past 2 months: Single Family Home Lives with:: Self Patient language and need for interpreter reviewed:: Yes Do you feel safe going back to the place where you live?: Yes      Need for Family Participation in Patient Care: No (Comment) Care giver support system in place?: Yes (comment) Current home services: Other (comment) (none) Criminal Activity/Legal Involvement Pertinent to Current Situation/Hospitalization: No - Comment as needed  Activities of Daily Living Home Assistive Devices/Equipment: None ADL Screening (condition at time of admission) Patient's cognitive ability adequate to safely complete daily activities?: Yes Is the patient deaf or have difficulty hearing?: No Does the patient have difficulty seeing, even when wearing glasses/contacts?: Yes Does the patient have difficulty concentrating, remembering, or making decisions?: No Patient able to express need for assistance with ADLs?: Yes Does the patient have difficulty dressing or bathing?:  No Independently performs ADLs?: Yes (appropriate for developmental age) Does the patient have difficulty walking or climbing stairs?: No Weakness of Legs: Both Weakness of Arms/Hands: Both  Permission Sought/Granted                  Emotional Assessment Appearance:: Appears stated age Attitude/Demeanor/Rapport: Engaged Affect (typically observed): Appropriate, Pleasant Orientation: : Oriented to Self, Oriented to Place, Oriented to  Time, Oriented to Situation      Admission diagnosis:  Status migrainosus [G43.901] Intractable chronic migraine with aura with status migrainosus [G43.E11] Patient Active Problem List   Diagnosis Date Noted   Chest pain 05/19/2023   Intractable chronic migraine with aura with status migrainosus 05/19/2023   Status migrainosus 05/18/2023   Dysmotility of stomach 01/06/2023   Depression 09/22/2022   Bruising 09/15/2022   Malabsorption syndrome 09/15/2022   Chronic arthralgias of knees and hips 09/15/2022   Malnutrition of moderate degree 07/15/2022   Abdominal pain 07/14/2022   Diarrhea 07/14/2022   FTT (failure to thrive) in adult 10/04/2020   Intractable nausea and vomiting 10/04/2020   Intractable vomiting 06/26/2020   Protein-calorie malnutrition, severe (HCC) 06/26/2020   IBS (irritable bowel syndrome) 06/26/2020   Dyslipidemia 06/26/2020   HTN (hypertension) 06/26/2020   Iron deficiency anemia due to dietary causes 06/26/2020   Pneumothorax 05/15/2020   Nausea & vomiting 05/15/2020   Unintentional weight loss 05/15/2020   PCP:  Nodal,  Joline Salt, PA-C Pharmacy:   [facility] [phone] - [place], [place] - [address] [place] [address] Phone: [phone] Fax: [phone]  [facility] [address], [place] - [address] [facility] [place] [address] Phone: [phone] Fax: [phone]     Social Determinants of Health (SDOH) Social History: SDOH Screenings   Food  Insecurity: Food Insecurity Present (05/19/2023)  Housing: Low Risk  (05/19/2023)  Transportation Needs: No Transportation Needs (05/19/2023)  Utilities: Not At Risk (05/19/2023)  Depression (PHQ2-9): Low Risk  (10/13/2020)  Tobacco Use: Low Risk  (05/18/2023)   SDOH Interventions:     Readmission Risk Interventions    07/15/2022   11:01 AM  Readmission Risk Prevention Plan  Transportation Screening Complete  PCP or Specialist Appt within 5-7 Days Complete  Home Care Screening Complete  Medication Review (RN CM) Complete

## 2023-05-20 NOTE — Progress Notes (Signed)
Neurology Progress Note   S:// Lying in bed, asleep but arousable to voice.  Continues to c/o headache. Oriented and follows commands, but sleepy and does doze off at times. Inconsistent visual fields on exam, possibly due to photophobia.   O:// Current vital signs: BP (!) 122/92   Pulse 79   Temp 98.4 F (36.9 C) (Oral)   Resp (!) 8   Ht 5\' 8"  (1.727 m)   Wt 55.1 kg   SpO2 99%   BMI 18.47 kg/m  Vital signs in last 24 hours: Temp:  [97.9 F (36.6 C)-99.1 F (37.3 C)] 98.4 F (36.9 C) (07/05 0345) Pulse Rate:  [70-116] 79 (07/05 0630) Resp:  [8-25] 8 (07/05 0630) BP: (110-156)/(77-114) 122/92 (07/05 0630) SpO2:  [97 %-100 %] 99 % (07/05 0630) Weight:  [55.1 kg-56.8 kg] 55.1 kg (07/04 2100)  GENERAL: Awake, alert in NAD HEENT: - Normocephalic and atraumatic, dry mm LUNGS - Clear to auscultation bilaterally with no wheezes CV - S1S2 RRR, no m/r/g, equal pulses bilaterally. ABDOMEN - Soft, nontender, nondistended with normoactive BS Ext: warm, well perfused, intact peripheral pulses, no edema   NEURO:  Mental Status: Sleeping, arousable to voice Language: speech is clear  Naming, repetition, fluency, and comprehension intact. Cranial Nerves: PERRL  EOMI, visual fields inconsistent on exam (some of this thought to be due to photophobia), no facial asymmetry, facial sensation intact, hearing intact, tongue/uvula/soft palate midline, normal sternocleidomastoid and trapezius muscle strength. No evidence of tongue atrophy or fibrillations Motor: 5/5 in all 4 extremities Tone: is normal and bulk is normal Sensation- Intact to light touch bilaterally Coordination: FTN intact bilaterally, no ataxia in BLE. Gait- deferred  Medications  Current Facility-Administered Medications:    0.9 %  sodium chloride infusion, , Intravenous, Continuous, Rai, Ripudeep K, MD, Last Rate: 75 mL/hr at 05/20/23 0600, Infusion Verify at 05/20/23 0600   acetaminophen (TYLENOL) tablet 650 mg, 650 mg,  Oral, Q6H PRN **OR** acetaminophen (TYLENOL) suppository 650 mg, 650 mg, Rectal, Q6H PRN, Doutova, Anastassia, MD   Chlorhexidine Gluconate Cloth 2 % PADS 6 each, 6 each, Topical, Daily, Doutova, Anastassia, MD, 6 each at 05/19/23 0950   copper tablet 2 mg, 2 mg, Oral, Daily, Rai, Ripudeep K, MD, 2 mg at 05/19/23 1315   cyclobenzaprine (FLEXERIL) tablet 10 mg, 10 mg, Oral, TID PRN, Adela Glimpse, Anastassia, MD   famotidine (PEPCID) tablet 40 mg, 40 mg, Oral, BID, Doutova, Anastassia, MD, 40 mg at 05/19/23 2133   feeding supplement (BOOST / RESOURCE BREEZE) liquid 1 Container, 1 Container, Oral, TID BM, Rai, Ripudeep K, MD, 1 Container at 05/19/23 1318   hydrALAZINE (APRESOLINE) injection 10 mg, 10 mg, Intravenous, Q6H PRN, Rai, Ripudeep K, MD   HYDROmorphone (DILAUDID) injection 0.5-1 mg, 0.5-1 mg, Intravenous, Q2H PRN, Ogan, Okoronkwo U, MD, 1 mg at 05/20/23 0207   ketamine (KETALAR) adult IV infusion 1000mg /175mL (10 mg/mL-Premix), 0.35 mg/kg/hr, Intravenous, Continuous, Erick Blinks, MD, Last Rate: 1.99 mL/hr at 05/20/23 0600, 0.35 mg/kg/hr at 05/20/23 0600   metoprolol tartrate (LOPRESSOR) tablet 25 mg, 25 mg, Oral, BID, Rai, Ripudeep K, MD, 25 mg at 05/19/23 0927   montelukast (SINGULAIR) tablet 10 mg, 10 mg, Oral, Daily PRN, Doutova, Anastassia, MD   multivitamin with minerals tablet 1 tablet, 1 tablet, Oral, Daily, Rai, Ripudeep K, MD   Oral care mouth rinse, 15 mL, Mouth Rinse, PRN, Doutova, Anastassia, MD   pantoprazole (PROTONIX) EC tablet 40 mg, 40 mg, Oral, BID, Doutova, Anastassia, MD, 40 mg at 05/19/23 2132  promethazine (PHENERGAN) 25 mg in sodium chloride 0.9 % 50 mL IVPB, 25 mg, Intravenous, Q6H PRN, Rai, Ripudeep K, MD, Stopped at 05/20/23 0510   rosuvastatin (CRESTOR) tablet 20 mg, 20 mg, Oral, Daily, Doutova, Anastassia, MD, 20 mg at 05/19/23 1610  Facility-Administered Medications Ordered in Other Encounters:    sodium chloride flush (NS) 0.9 % injection 10 mL, 10 mL,  Intracatheter, PRN, Loni Muse, MD, 10 mL at 09/15/22 1258  Labs CBC    Component Value Date/Time   WBC 6.4 05/20/2023 0500   RBC 4.50 05/20/2023 0500   HGB 14.0 05/20/2023 0500   HCT 42.4 05/20/2023 0500   PLT 144 (L) 05/20/2023 0500   MCV 94.2 05/20/2023 0500   MCH 31.1 05/20/2023 0500   MCHC 33.0 05/20/2023 0500   RDW 13.4 05/20/2023 0500   LYMPHSABS 1.5 05/10/2023 0648   MONOABS 0.5 05/10/2023 0648   EOSABS 0.0 05/10/2023 0648   BASOSABS 0.0 05/10/2023 0648    CMP     Component Value Date/Time   NA 137 05/20/2023 0500   K 3.7 05/20/2023 0500   CL 107 05/20/2023 0500   CO2 25 05/20/2023 0500   GLUCOSE 104 (H) 05/20/2023 0500   BUN 13 05/20/2023 0500   CREATININE 0.72 05/20/2023 0500   CREATININE 0.67 09/15/2022 1207   CALCIUM 7.7 (L) 05/20/2023 0500   PROT 5.0 (L) 05/19/2023 0615   ALBUMIN 2.7 (L) 05/19/2023 0615   AST 25 05/19/2023 0615   AST 26 09/15/2022 1207   ALT 38 05/19/2023 0615   ALT 33 09/15/2022 1207   ALKPHOS 53 05/19/2023 0615   BILITOT 0.7 05/19/2023 0615   BILITOT 1.0 09/15/2022 1207   GFRNONAA >60 05/20/2023 0500   GFRNONAA >60 09/15/2022 1207   GFRAA >60 06/26/2020 1737    Lipid Panel  No results found for: "CHOL", "TRIG", "HDL", "CHOLHDL", "VLDL", "LDLCALC", "LDLDIRECT"  No results found for: "HGBA1C"   Imaging I have reviewed images in epic and the results pertinent to this consultation are:  MRI examination of the brain no acute process MRV no dural sinus thrombosis  Assessment:  57 y.o. male who presents with status migrainosus. Third presentation to the ED and failed multiple headache cocktails. He was admitted for DHE but had chest pain after 1st dose and this was d/c'd.transferred to ICU and Ketamine started 7/4 pm. Headache remains 10/10 so will increase infusion rate.  Recommendations: - continue Ketamine to 0.5mg /kg/hr - stop Dilaudid to avoid rebound h/a - IVF/Hydration - Will continue to follow  Pt seen by  Neuro NP/APP and later by MD. Note/plan to be edited by MD as needed.    Lynnae January, DNP, AGACNP-BC [facility] Please use AMION for contact information & EPIC for messaging.   Attending Neurohospitalist Addendum Patient seen and examined with APP/Resident. Agree with the history and physical as documented above. Agree with the plan as documented, which I helped formulate. I have edited the note above to reflect my full findings and recommendations. I have independently reviewed the chart, obtained history, review of systems and examined the patient.I have personally reviewed pertinent head/neck/spine imaging (CT/MRI). Please feel free to call with any questions.  -- Bing Neighbors, MD [facility] [phone]  If 7pm- 7am, please page neurology on call as listed in AMION.

## 2023-05-20 NOTE — Progress Notes (Signed)
CSW met with pt regarding SDOH: food.  Pt also expresses needs for financial assistance with bills/utilities. Pt only income is through disability, receives $1600 per month.  Pt does receive food stamps of $30 per month.  Pt does utilize several food pantries in [place].  CSW provided more comprehensive list of food assistance. Pt also reports often runs short with ability to pay bills/utilities.  Contact information provided for [facility] DSS for financial assistance programs there.  Pt verbalized understanding. Daleen Squibb, MSW, LCSW 7/5/202412:04 PM

## 2023-05-20 NOTE — Progress Notes (Signed)
NAME:  Edwin Hall, MRN:  [id number], DOB:  11/07/1966, LOS: 2 ADMISSION DATE:  05/18/2023, CONSULTATION DATE:  05/19/2023 REFERRING MD:  Gevena Mart, NP , CHIEF COMPLAINT:  Headache    History of Present Illness:  20 yoM with PMH of IBS, HLD, asthma, headache, barretts esophagus s/p partial esophagectomy c/b malabsorption syndrome who was admitted to [facility] 7/3 with 12 days of persistent migraine with bifrontal throbbing pain with blurred vision, nausea and vomiting unresponsive to multiple medications, two ER visits and referred back to ER by neurologist, followed by [facility]. This admit, Neurology consulted, but continued pain despite decadron, dilaudid, ativan, compazine, valproic acid and phenergan. MRI brain and MR venogram negative. DHE (dihydroergotamine) attempted but patient developed severe chest pain and hypertension. Neurology consulting PCCM for transfer to ICU to start ketamine infusion for pain control.   Pertinent  Medical History       Past Medical History:  Diagnosis Date   Anxiety     Asthma     Barrett's esophagus     Blood transfusion without reported diagnosis     Complication of anesthesia      nasuea and vomiting   Food allergy     Headache     Hyperlipidemia     IBS (irritable bowel syndrome)     Iron deficiency anemia     Urticaria     Significant Hospital Events: Including procedures, antibiotic start and stop dates in addition to other pertinent events   7/3 admitted  07/04 Transferred to ICU for Ketamine infusion   Interim History / Subjective:  Patient evaluated bedside this morning.  Patient states that he is still having significant headache.  He also reports blurry vision along with his headaches.  He denies any neck pain.  He reports lacrimation as well.  He states his headache right now is at a 9 out of 10, and the ketamine decreased from a 10 out of 10.  He describes it as Sports administrator in his head".  He describes having 4 vomiting episodes  over the day yesterday.   Objective   Blood pressure (!) 122/92, pulse 79, temperature 98 F (36.7 C), temperature source Oral, resp. rate (!) 8, height 5\' 8"  (1.727 m), weight 55.1 kg, SpO2 99 %. Temperature 97.9-99.1, pulse 67-116, currently 79, respiration rate 8-25, blood pressure 122/92, satting at 99% on room air.         Intake/Output Summary (Last 24 hours) at 05/20/2023 0801 Last data filed at 05/20/2023 0600 Gross per 24 hour  Intake 1036.94 ml  Output 1900 ml  Net -863.06 ml   Filed Weights   05/18/23 1445 05/19/23 1000 05/19/23 2100  Weight: 54.4 kg 56.8 kg 55.1 kg    Examination: General: In bed in no acute distress resting in eating breakfast Neuro: Awake, interactive, following all commands CV: Regular rate and rhythm, no murmurs appreciated PULM: Clear to auscultation bilaterally GI: Normoactive bowels, nontender, soft Extremities: warm/dry, no LE edema  Skin: no rashes, pale  Labs reviewed  BMP: Sodium 137 (134), bicarb 25 (24), creatinine 0.72 (2.66) CBC: White count 6.4, hemoglobin 14, platelet 144   05/18/2023: MR MRV head with without contrast, no acute intracranial process.  No evidence of subacute or acute infarct. MRI brain with and without contrast, no intracranial abnormalities.  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  He has been followed by neurology in the past.  With concern for worsening headaches and ineffective abortive treatments, patient was started on ketamine drip and admitted to the ICU for further evaluation and management.  Overnight, patient did have continuous headaches, also reported vision changes and nausea/vomiting.  He is currently on ketamine.  Also receiving promethazine.  Patient also did receive Dilaudid.  Given continuous headaches, feel like ketamine is not working, or  will need adjunct of therapy. -Continue IV ketamine  -Appreciate neurology recommendations -Continue to monitor respiratory status -Consider antipsychotic such as Haldol -Could also consider benzodiazepines or anti epileptics  -Continue on fluids -Encourage eating -Close vital sign monitoring while patient on ketamine    #Hypomagnesemia Mag at 1.79.  Did give 2 g yesterday.  Will give 2 grams today -Goal mag greater than 2 -Recheck in AM    #HLD -Continue Crestor 20 mg daily   #Atypical chest pain/ hypertension  Patient denies much chest pain this morning.  Negative cardiac workup. -Continue metoprolol to tartrate 25 mg twice daily.  Blood pressure within normal limits.     #Moderate protein calorie malnutrition -Encourage oral intake  -RD following   Best Practice (right click and "Reselect all SmartList Selections" daily)   Diet/type: Regular consistency (see orders) DVT prophylaxis: SCD GI prophylaxis: H2B Lines: N/A Foley:  N/A Code Status:  full code Last date of multidisciplinary goals of care discussion  Labs   CBC: Recent Labs  Lab 05/16/23 0231 05/18/23 2213 05/19/23 0615 05/20/23 0500  WBC  --  7.2 6.9 6.4  HGB 14.6 13.1 13.9 14.0  HCT 43.0 39.6 40.1 42.4  MCV  --  95.7 92.6 94.2  PLT  --  150 151 144*    Basic Metabolic Panel: Recent Labs  Lab 05/16/23 0231 05/18/23 2213 05/19/23 0615 05/19/23 0616 05/20/23 0500  NA 137 136 134*  --  137  K 3.6 4.0 4.2  --  3.7  CL 103 105 102  --  107  CO2  --  24 24  --  25  GLUCOSE 117* 114* 107*  --  104*  BUN 18 12 13   --  13  CREATININE 0.70 0.65 0.66  --  0.72  CALCIUM  --  7.5* 8.1*  --  7.7*  MG  --   --  1.6* 1.7  --   PHOS  --   --  3.8  --   --    GFR: Estimated Creatinine Clearance: 81.3 mL/min (by C-G formula based on SCr of 0.72 mg/dL). Recent Labs  Lab 05/18/23 2213 05/19/23 0615 05/20/23 0500  WBC 7.2 6.9 6.4    Liver Function Tests: Recent Labs  Lab 05/18/23 2213  05/19/23 0615  AST 24 25  ALT 37 38  ALKPHOS 49 53  BILITOT 0.8 0.7  PROT 5.0* 5.0*  ALBUMIN 2.8* 2.7*   No results for input(s): "LIPASE", "AMYLASE" in the last 168 hours. No results for input(s): "AMMONIA" in the last 168 hours.  ABG    Component Value Date/Time   TCO2 26 05/16/2023 0231     Coagulation Profile: No results for input(s): "INR", "PROTIME" in the last 168 hours.  Cardiac Enzymes: Recent Labs  Lab 05/19/23 0616  CKTOTAL 39*    HbA1C: No results found for: "HGBA1C"  CBG: No results for input(s): "GLUCAP" in the last 168 hours.  Review of Systems:   Patient endorses headache, nausea, vomiting.  Past Medical History:  He,  has a past medical history of Anxiety, Asthma, Barrett's esophagus, Blood transfusion without reported diagnosis, Complication of anesthesia, Food allergy, Headache, Hyperlipidemia, IBS (irritable bowel syndrome), Iron deficiency anemia, and Urticaria.   Surgical History:   Past Surgical History:  Procedure Laterality Date   ABDOMINAL SURGERY  12/2015   pylorus plasty   APPENDECTOMY  1984   BIOPSY  05/18/2020   Procedure: BIOPSY;  Surgeon: Kerin Salen, MD;  Location: [facility];  Service: Gastroenterology;;   CHOLECYSTECTOMY  2008   ESOPHAGOGASTRODUODENOSCOPY (EGD) WITH PROPOFOL N/A 05/18/2020   Procedure: ESOPHAGOGASTRODUODENOSCOPY (EGD) WITH PROPOFOL;  Surgeon: Kerin Salen, MD;  Location: [facility];  Service: Gastroenterology;  Laterality: N/A;   GASTROSTOMY TUBE PLACEMENT     HIATAL HERNIA REPAIR  07/2015   PARTIAL ESOPHAGECTOMY  12/10/2021   TALC PLEURODESIS     TONSILLECTOMY  1980   VENTRAL HERNIA REPAIR  2010     Social History:   reports that he has never smoked. He has never used smokeless tobacco. He reports that he does not drink alcohol and does not use drugs.   Family History:  His family history includes Angioedema in his mother; Dementia in his mother; Heart disease in his father; Lung cancer (age of  onset: 52) in his brother; Parkinson's disease in his mother. There is no history of Allergic rhinitis, Asthma, Eczema, Immunodeficiency, or Urticaria.   Allergies Allergies  Allergen Reactions   Mushroom Extract Complex Anaphylaxis and Hives   Other Anaphylaxis    All seafood    Shellfish Allergy Anaphylaxis   Reglan [Metoclopramide] Hives    Severe itching all over   Zofran [Ondansetron Hcl] Itching and Rash    Patient states he develops a rash.   Ergotamine Hypertension    Severe chest pain and hypertension to DHE(Dihydroergotamine)   Tetracyclines & Related Itching     Home Medications  Prior to Admission medications   Medication Sig Start Date End Date Taking? Authorizing Provider  amitriptyline (ELAVIL) 25 MG tablet Take 25 mg by mouth at bedtime as needed (migraines). 04/15/23  Yes [provider]  aspirin 81 MG EC tablet Take 81 mg by mouth at bedtime. Swallow whole.   Yes [provider]  atorvastatin (LIPITOR) 40 MG tablet Take 1 tablet by mouth at bedtime. 04/22/23  Yes [provider]  Cholecalciferol (VITAMIN D3) 10 MCG (400 UNIT) CAPS Take 400 Units by mouth daily.   Yes [provider]  Cyanocobalamin (VITAMIN B 12 PO) Take 1,000 mcg by mouth daily.   Yes [provider]  cyclobenzaprine (FLEXERIL) 10 MG tablet Take 10 mg by mouth 3 (three) times daily as needed for muscle spasms. 07/05/22  Yes [provider]  famotidine (PEPCID) 40 MG tablet Take 40 mg by mouth 2 (two) times daily. 06/21/22  Yes [provider]  Ferrous Sulfate (IRON) 325 (65 Fe) MG TABS Take 325 mg by mouth every other day.  05/12/20  Yes [provider]  Multiple Vitamin (MULTIVITAMIN WITH MINERALS) TABS tablet Take 1 tablet by mouth daily.   Yes [provider]  pantoprazole (PROTONIX) 40 MG tablet Take 40 mg by mouth 2 (two) times daily.   Yes [provider]  promethazine (PHENERGAN) 25 MG tablet Take 25 mg by mouth  every 6 (six) hours as needed for nausea or vomiting.   Yes [provider]  rizatriptan (MAXALT) 10 MG tablet Take by mouth. 03/24/23  Yes [provider]  rosuvastatin (CRESTOR) 20 MG tablet Take 20  mg by mouth at bedtime.   Yes [provider]  sucralfate (CARAFATE) 1 g tablet Take 1 tablet (1 g total) by mouth 4 (four) times daily -  with meals and at bedtime. 10/17/20  Yes Tilden Fossa, MD  traZODone (DESYREL) 50 MG tablet Take 50 mg by mouth at bedtime.   Yes [provider]  Zinc 50 MG TABS Take 50 mg by mouth daily.    Yes [provider]  azelastine (ASTELIN) 0.1 % nasal spray 2 sprays per nostril twice a day for control of nasal drainage. Patient not taking: Reported on 05/02/2020 11/27/19 05/02/20  Marcelyn Bruins, MD     Critical care time: 63    Modena Slater, DO Internal Medicine Resident PGY-2 Pager: [phone]

## 2023-05-21 DIAGNOSIS — G43E11 Chronic migraine with aura, intractable, with status migrainosus: Secondary | ICD-10-CM | POA: Diagnosis not present

## 2023-05-21 LAB — GLUCOSE, CAPILLARY
Glucose-Capillary: 105 mg/dL — ABNORMAL HIGH (ref 70–99)
Glucose-Capillary: 69 mg/dL — ABNORMAL LOW (ref 70–99)
Glucose-Capillary: 80 mg/dL (ref 70–99)

## 2023-05-21 LAB — BASIC METABOLIC PANEL
Anion gap: 7 (ref 5–15)
BUN: 20 mg/dL (ref 6–20)
CO2: 23 mmol/L (ref 22–32)
Calcium: 7.7 mg/dL — ABNORMAL LOW (ref 8.9–10.3)
Chloride: 106 mmol/L (ref 98–111)
Creatinine, Ser: 0.56 mg/dL — ABNORMAL LOW (ref 0.61–1.24)
GFR, Estimated: >60 mL/min (ref >60)
Glucose, Bld: 88 mg/dL (ref 70–99)
Potassium: 4.2 mmol/L (ref 3.5–5.1)
Sodium: 136 mmol/L (ref 135–145)

## 2023-05-21 LAB — CBC
HCT: 43 % (ref 39.0–52.0)
Hemoglobin: 14.1 g/dL (ref 13.0–17.0)
MCH: 31.3 pg (ref 26.0–34.0)
MCHC: 32.8 g/dL (ref 30.0–36.0)
MCV: 95.6 fL (ref 80.0–100.0)
Platelets: 154 10*3/uL (ref 150–400)
RBC: 4.5 MIL/uL (ref 4.22–5.81)
RDW: 13.7 % (ref 11.5–15.5)
WBC: 8.9 10*3/uL (ref 4.0–10.5)
nRBC: 0 % (ref 0.0–0.2)

## 2023-05-21 LAB — MAGNESIUM: Magnesium: 1.9 mg/dL (ref 1.7–2.4)

## 2023-05-21 MED ORDER — HALOPERIDOL LACTATE 5 MG/ML IJ SOLN
1.0000 mg | Freq: Four times a day (QID) | INTRAMUSCULAR | Status: DC | PRN
  Administered 2023-05-21: 1 mg via INTRAVENOUS
  Filled 2023-05-21: qty 1

## 2023-05-21 MED ORDER — DEXTROSE-SODIUM CHLORIDE 5-0.9 % IV SOLN: INTRAVENOUS | Status: DC

## 2023-05-21 MED ORDER — HYDROMORPHONE HCL 1 MG/ML IJ SOLN
1.0000 mg | INTRAMUSCULAR | Status: DC | PRN
  Administered 2023-05-21 (×2): 1 mg via INTRAVENOUS
  Filled 2023-05-21 (×2): qty 1

## 2023-05-21 MED ORDER — PANTOPRAZOLE SODIUM 40 MG PO TBEC
40.0000 mg | DELAYED_RELEASE_TABLET | Freq: Two times a day (BID) | ORAL | Status: DC
  Administered 2023-05-21 – 2023-05-25 (×9): 40 mg via ORAL
  Filled 2023-05-21 (×9): qty 1

## 2023-05-21 MED ORDER — MAGNESIUM SULFATE 2 GM/50ML IV SOLN
2.0000 g | Freq: Once | INTRAVENOUS | Status: AC
  Administered 2023-05-21: 2 g via INTRAVENOUS
  Filled 2023-05-21: qty 50

## 2023-05-21 MED ORDER — NALOXONE HCL 0.4 MG/ML IJ SOLN: 0.4000 mg | INTRAMUSCULAR | Status: DC | PRN

## 2023-05-21 MED ORDER — HYDROMORPHONE HCL 1 MG/ML IJ SOLN
2.0000 mg | Freq: Once | INTRAMUSCULAR | Status: AC
  Administered 2023-05-21: 2 mg via INTRAVENOUS
  Filled 2023-05-21: qty 2

## 2023-05-21 MED ORDER — HYDROMORPHONE HCL 1 MG/ML IJ SOLN
2.0000 mg | INTRAMUSCULAR | Status: DC | PRN
  Administered 2023-05-21 (×2): 2 mg via INTRAVENOUS
  Filled 2023-05-21 (×2): qty 2

## 2023-05-21 MED ORDER — HALOPERIDOL LACTATE 5 MG/ML IJ SOLN: 1.0000 mg | Freq: Four times a day (QID) | INTRAMUSCULAR | Status: DC | PRN

## 2023-05-21 NOTE — Progress Notes (Signed)
Brief Neuro Note:  Notified by RN that patient is becoming more somnolent, repetitive and she is worried about increasing Ketamine to 1mg /Kg/hr from his current dose of 0.75mg /Kg/hr. I agree with her that going up on Ketamine may not be a good idea at this time. We should still continue with PRN Dilaudid specially since we can reverse opiods with Narcan. Will have PRN Narcan available. He is on continuous pulse ox and tele and vitals are normal.  Erick Blinks [facility]

## 2023-05-21 NOTE — Progress Notes (Signed)
eLink Physician-Brief Progress Note Patient Name: [name] DOB: January 16, 1966 MRN: [id number]   Date of Service  05/21/2023  HPI/Events of Note  Notified that patient was acting a little strange and is due to increase his ketamine dose. Also had a glucose of 69. Has been on varying dose ketamine for status migranosus. Ketamine can cause some of these changes. He is fully oriented. RN also reaching out to neuro but likely that we will not raise the dose for now. Will also try and fix the glucose. I don't see checks ordered earlier.   eICU Interventions  Add D5 NS with Q4 checks.      Intervention Category Major Interventions: Hyperglycemia - active titration of insulin therapy  Oretha Milch 05/21/2023, 8:20 PM

## 2023-05-21 NOTE — Progress Notes (Signed)
NAME:  Edwin Hall, MRN:  [id number], DOB:  1966/05/12, LOS: 3 ADMISSION DATE:  05/18/2023, CONSULTATION DATE:  05/19/2023 REFERRING MD:  Gevena Mart, NP , CHIEF COMPLAINT:  Headache    History of Present Illness:  65 yoM with PMH of IBS, HLD, asthma, headache, barretts esophagus s/p partial esophagectomy c/b malabsorption syndrome who was admitted to [facility] 7/3 with 12 days of persistent migraine with bifrontal throbbing pain with blurred vision, nausea and vomiting unresponsive to multiple medications, two ER visits and referred back to ER by neurologist, followed by [facility]. This admit, Neurology consulted, but continued pain despite decadron, dilaudid, ativan, compazine, valproic acid and phenergan. MRI brain and MR venogram negative. DHE (dihydroergotamine) attempted but patient developed severe chest pain and hypertension. Neurology consulting PCCM for transfer to ICU to start ketamine infusion for pain control.   Pertinent  Medical History       Past Medical History:  Diagnosis Date   Anxiety     Asthma     Barrett's esophagus     Blood transfusion without reported diagnosis     Complication of anesthesia      nasuea and vomiting   Food allergy     Headache     Hyperlipidemia     IBS (irritable bowel syndrome)     Iron deficiency anemia     Urticaria     Significant Hospital Events: Including procedures, antibiotic start and stop dates in addition to other pertinent events   7/3 admitted  07/04 Transferred to ICU for Ketamine infusion   Interim History / Subjective:  Patient evaluated at bedside this morning.  Patient crying stating he has the worst headache of his life, feels like the head is getting crushed between 2 boulders. He states that he is hungry and reports having 1 vomiting episode overnight.   Objective   Blood pressure (!) 131/94, pulse 79, temperature 98 F (36.7 C), temperature source Oral, resp. rate 11, height 5\' 8"  (1.727 m), weight 55.1 kg,  SpO2 98 %.  Temperature 97.6-98.4, pulse 65-102 currently 79, respiration 7-16, blood pressure 130s-140s systolic over 90s diastolic, satting at 94-99% on room air.        Intake/Output Summary (Last 24 hours) at 05/21/2023 0734 Last data filed at 05/21/2023 0701 Gross per 24 hour  Intake 611.31 ml  Output 300 ml  Net 311.31 ml    Filed Weights   05/18/23 1445 05/19/23 1000 05/19/23 2100  Weight: 54.4 kg 56.8 kg 55.1 kg    Examination: General: In bed, in some distress, tearful Neuro: Awake, interactive, able to follow instructions, no neurological deficits CV: Regular rate and rhythm, no murmurs appreciated PULM: Clear to auscultation bilaterally GI: Normoactive bowels, nontender, soft Extremities: warm/dry, no LE edema  Skin: no rashes, pale  Labs reviewed  Magnesium: 1.9 BMP: Sodium 136, potassium 4.2, bicarb 23, creatinine 0.56 CBC: White count 8.9, hemoglobin 14.1, platelets 154   Resolved Hospital Problem list     Assessment & Plan:  #Status migrainosus with intractable nausea and vomiting Patient evaluated bedside this morning.  He remains in distress.  Reports having 1 vomiting episode overnight.  Patient is tearful and has a throbbing headache.  Tenderness noted to temporal regions.  No rashes appreciated.  Patient has had intractable nausea vomiting with status migrainosus.  Reached out to neuro, who is recommending Dilaudid.  Will potentially also increase ketamine.  They did state that we can start Haldol as well.  -Continue IV ketamine -Follow neurology  recommendation -Start as needed Haldol -Can continue as needed Dilaudid -As needed Tylenol and Flexeril -As needed Phenergan -Continue to monitor vital signs  #Hypomagnesemia Magnesium at 1.9.  Goal would be greater than 2.  Will give 2 g today.  Mag at 1.79.  Did give 2 g yesterday.   -Goal greater than 2 -Replete mag today -Recheck mag in morning   #HLD -Continue Crestor 20 mg daily   #Atypical chest  pain/ hypertension  Resolved, no acute concerns at this time. -Continue metoprolol tartrate 25 mg twice daily    #Moderate protein calorie malnutrition -Continue encourage oral intake  Best Practice (right click and "Reselect all SmartList Selections" daily)   Diet/type: Regular consistency (see orders) DVT prophylaxis: SCD GI prophylaxis: H2B Lines: N/A Foley:  N/A Code Status:  full code Last date of multidisciplinary goals of care discussion  Labs   CBC: Recent Labs  Lab 05/16/23 0231 05/18/23 2213 05/19/23 0615 05/20/23 0500 05/21/23 0539  WBC  --  7.2 6.9 6.4 8.9  HGB 14.6 13.1 13.9 14.0 14.1  HCT 43.0 39.6 40.1 42.4 43.0  MCV  --  95.7 92.6 94.2 95.6  PLT  --  150 151 144* 154     Basic Metabolic Panel: Recent Labs  Lab 05/16/23 0231 05/18/23 2213 05/19/23 0615 05/19/23 0616 05/20/23 0500 05/20/23 0759 05/21/23 0539  NA 137 136 134*  --  137  --  136  K 3.6 4.0 4.2  --  3.7  --  4.2  CL 103 105 102  --  107  --  106  CO2  --  24 24  --  25  --  23  GLUCOSE 117* 114* 107*  --  104*  --  88  BUN 18 12 13   --  13  --  20  CREATININE 0.70 0.65 0.66  --  0.72  --  0.56*  CALCIUM  --  7.5* 8.1*  --  7.7*  --  7.7*  MG  --   --  1.6* 1.7  --  1.9 1.9  PHOS  --   --  3.8  --   --   --   --     GFR: Estimated Creatinine Clearance: 81.3 mL/min (A) (by C-G formula based on SCr of 0.56 mg/dL (L)). Recent Labs  Lab 05/18/23 2213 05/19/23 0615 05/20/23 0500 05/21/23 0539  WBC 7.2 6.9 6.4 8.9     Liver Function Tests: Recent Labs  Lab 05/18/23 2213 05/19/23 0615  AST 24 25  ALT 37 38  ALKPHOS 49 53  BILITOT 0.8 0.7  PROT 5.0* 5.0*  ALBUMIN 2.8* 2.7*    No results for input(s): "LIPASE", "AMYLASE" in the last 168 hours. No results for input(s): "AMMONIA" in the last 168 hours.  ABG    Component Value Date/Time   TCO2 26 05/16/2023 0231     Coagulation Profile: No results for input(s): "INR", "PROTIME" in the last 168 hours.  Cardiac  Enzymes: Recent Labs  Lab 05/19/23 0616  CKTOTAL 39*     HbA1C: No results found for: "HGBA1C"  CBG: No results for input(s): "GLUCAP" in the last 168 hours.  Review of Systems:   Patient endorses headache, nausea, vomiting.  Past Medical History:  He,  has a past medical history of Anxiety, Asthma, Barrett's esophagus, Blood transfusion without reported diagnosis, Complication of anesthesia, Food allergy, Headache, Hyperlipidemia, IBS (irritable bowel syndrome), Iron deficiency anemia, and Urticaria.   Surgical History:   Past Surgical  History:  Procedure Laterality Date   ABDOMINAL SURGERY  12/2015   pylorus plasty   APPENDECTOMY  1984   BIOPSY  05/18/2020   Procedure: BIOPSY;  Surgeon: Kerin Salen, MD;  Location: [facility];  Service: Gastroenterology;;   CHOLECYSTECTOMY  2008   ESOPHAGOGASTRODUODENOSCOPY (EGD) WITH PROPOFOL N/A 05/18/2020   Procedure: ESOPHAGOGASTRODUODENOSCOPY (EGD) WITH PROPOFOL;  Surgeon: Kerin Salen, MD;  Location: [facility];  Service: Gastroenterology;  Laterality: N/A;   GASTROSTOMY TUBE PLACEMENT     HIATAL HERNIA REPAIR  07/2015   PARTIAL ESOPHAGECTOMY  12/10/2021   TALC PLEURODESIS     TONSILLECTOMY  1980   VENTRAL HERNIA REPAIR  2010     Social History:   reports that he has never smoked. He has never used smokeless tobacco. He reports that he does not drink alcohol and does not use drugs.   Family History:  His family history includes Angioedema in his mother; Dementia in his mother; Heart disease in his father; Lung cancer (age of onset: 41) in his brother; Parkinson's disease in his mother. There is no history of Allergic rhinitis, Asthma, Eczema, Immunodeficiency, or Urticaria.   Allergies Allergies  Allergen Reactions   Mushroom Extract Complex Anaphylaxis and Hives   Other Anaphylaxis    All seafood    Shellfish Allergy Anaphylaxis   Reglan [Metoclopramide] Hives    Severe itching all over   Zofran [Ondansetron Hcl]  Itching and Rash    Patient states he develops a rash.   Ergotamine Hypertension    Severe chest pain and hypertension to DHE(Dihydroergotamine)   Tetracyclines & Related Itching     Home Medications  Prior to Admission medications   Medication Sig Start Date End Date Taking? Authorizing Provider  amitriptyline (ELAVIL) 25 MG tablet Take 25 mg by mouth at bedtime as needed (migraines). 04/15/23  Yes [provider]  aspirin 81 MG EC tablet Take 81 mg by mouth at bedtime. Swallow whole.   Yes [provider]  atorvastatin (LIPITOR) 40 MG tablet Take 1 tablet by mouth at bedtime. 04/22/23  Yes [provider]  Cholecalciferol (VITAMIN D3) 10 MCG (400 UNIT) CAPS Take 400 Units by mouth daily.   Yes [provider]  Cyanocobalamin (VITAMIN B 12 PO) Take 1,000 mcg by mouth daily.   Yes [provider]  cyclobenzaprine (FLEXERIL) 10 MG tablet Take 10 mg by mouth 3 (three) times daily as needed for muscle spasms. 07/05/22  Yes [provider]  famotidine (PEPCID) 40 MG tablet Take 40 mg by mouth 2 (two) times daily. 06/21/22  Yes [provider]  Ferrous Sulfate (IRON) 325 (65 Fe) MG TABS Take 325 mg by mouth every other day.  05/12/20  Yes [provider]  Multiple Vitamin (MULTIVITAMIN WITH MINERALS) TABS tablet Take 1 tablet by mouth daily.   Yes [provider]  pantoprazole (PROTONIX) 40 MG tablet Take 40 mg by mouth 2 (two) times daily.   Yes [provider]  promethazine (PHENERGAN) 25 MG tablet Take 25 mg by mouth every 6 (six) hours as needed for nausea or vomiting.   Yes [provider]  rizatriptan (MAXALT) 10 MG tablet Take by mouth. 03/24/23  Yes [provider]  rosuvastatin (CRESTOR) 20 MG tablet Take 20 mg by mouth at bedtime.   Yes [provider]  sucralfate (CARAFATE) 1 g tablet Take 1 tablet (1 g total) by mouth 4 (four) times daily -  with meals and at bedtime. 10/17/20  Yes  Tilden Fossa, MD  traZODone (DESYREL) 50 MG tablet Take 50 mg by mouth at bedtime.   Yes [provider]  Zinc 50 MG TABS Take 50 mg by mouth daily.    Yes [provider]  azelastine (ASTELIN) 0.1 % nasal spray 2 sprays per nostril twice a day for control of nasal drainage. Patient not taking: Reported on 05/02/2020 11/27/19 05/02/20  Marcelyn Bruins, MD     Critical care time: 81    Modena Slater, DO Internal Medicine Resident PGY-2 Pager: [phone]

## 2023-05-21 NOTE — Progress Notes (Signed)
eLink Physician-Brief Progress Note Patient Name: Edwin Hall DOB: Feb 28, 1966 MRN: [id number]   Date of Service  05/21/2023  HPI/Events of Note  Patient receiving Tylenol, Flexeril and a ketamine drip for migraine.  He is complaining of increasing headache now.  He is requesting Dilaudid which she was getting before.  eICU Interventions  Patient's chart reviewed.  Prior notes reviewed.  Note from neurology dated 7/5 recommends discontinuing Dilaudid to prevent rebound headache.  Will defer to neurology to make the decision on whether or not to reintroduce Dilaudid. Bedside nurse notified.  He will page neurology on-call. eRN also notified.     Intervention Category Intermediate Interventions: Pain - evaluation and management  Carilyn Goodpasture 05/21/2023, 2:13 AM

## 2023-05-22 DIAGNOSIS — E785 Hyperlipidemia, unspecified: Secondary | ICD-10-CM | POA: Diagnosis not present

## 2023-05-22 DIAGNOSIS — R072 Precordial pain: Secondary | ICD-10-CM | POA: Diagnosis not present

## 2023-05-22 DIAGNOSIS — G43901 Migraine, unspecified, not intractable, with status migrainosus: Secondary | ICD-10-CM | POA: Diagnosis not present

## 2023-05-22 DIAGNOSIS — G43E11 Chronic migraine with aura, intractable, with status migrainosus: Secondary | ICD-10-CM | POA: Diagnosis not present

## 2023-05-22 LAB — GLUCOSE, CAPILLARY
Glucose-Capillary: 108 mg/dL — ABNORMAL HIGH (ref 70–99)
Glucose-Capillary: 122 mg/dL — ABNORMAL HIGH (ref 70–99)
Glucose-Capillary: 112 mg/dL — ABNORMAL HIGH (ref 70–99)
Glucose-Capillary: 109 mg/dL — ABNORMAL HIGH (ref 70–99)
Glucose-Capillary: 99 mg/dL (ref 70–99)
Glucose-Capillary: 108 mg/dL — ABNORMAL HIGH (ref 70–99)

## 2023-05-22 LAB — BASIC METABOLIC PANEL
Anion gap: 10 (ref 5–15)
BUN: 16 mg/dL (ref 6–20)
CO2: 27 mmol/L (ref 22–32)
Calcium: 8.1 mg/dL — ABNORMAL LOW (ref 8.9–10.3)
Chloride: 99 mmol/L (ref 98–111)
Creatinine, Ser: 0.59 mg/dL — ABNORMAL LOW (ref 0.61–1.24)
GFR, Estimated: >60 mL/min (ref >60)
Glucose, Bld: 110 mg/dL — ABNORMAL HIGH (ref 70–99)
Potassium: 4.4 mmol/L (ref 3.5–5.1)
Sodium: 136 mmol/L (ref 135–145)

## 2023-05-22 LAB — MAGNESIUM: Magnesium: 1.8 mg/dL (ref 1.7–2.4)

## 2023-05-22 MED ORDER — MAGNESIUM SULFATE 2 GM/50ML IV SOLN
2.0000 g | Freq: Once | INTRAVENOUS | Status: AC
  Administered 2023-05-22: 2 g via INTRAVENOUS
  Filled 2023-05-22: qty 50

## 2023-05-22 MED ORDER — KETAMINE HCL 10 MG/ML IJ SOLN
0.5000 mg/kg/h | Status: AC
  Administered 2023-05-23: 0.75 mg/kg/h via INTRAVENOUS
  Filled 2023-05-22 (×3): qty 100

## 2023-05-22 NOTE — Progress Notes (Signed)
Neurology Progress Note     S:// Lying in bed, asleep but arousable to voice.  Continues to c/o headache. Oriented and follows commands, but sleepy and does doze off at times. Inconsistent visual fields on exam, possibly due to photophobia.    O:// Current vital signs: BP (!) 122/92   Pulse 79   Temp 98.4 F (36.9 C) (Oral)   Resp (!) 8   Ht 5\' 8"  (1.727 m)   Wt 55.1 kg   SpO2 99%   BMI 18.47 kg/m  Vital signs in last 24 hours: Temp:  [97.9 F (36.6 C)-99.1 F (37.3 C)] 98.4 F (36.9 C) (07/05 0345) Pulse Rate:  [70-116] 79 (07/05 0630) Resp:  [8-25] 8 (07/05 0630) BP: (110-156)/(77-114) 122/92 (07/05 0630) SpO2:  [97 %-100 %] 99 % (07/05 0630) Weight:  [55.1 kg-56.8 kg] 55.1 kg (07/04 2100)   GENERAL: Awake, alert in NAD HEENT: - Normocephalic and atraumatic, dry mm LUNGS - Clear to auscultation bilaterally with no wheezes CV - S1S2 RRR, no m/r/g, equal pulses bilaterally. ABDOMEN - Soft, nontender, nondistended with normoactive BS Ext: warm, well perfused, intact peripheral pulses, no edema    NEURO:  Mental Status: Sleeping, arousable to voice Language: speech is clear  Naming, repetition, fluency, and comprehension intact. Cranial Nerves: PERRL  EOMI, visual fields inconsistent on exam (some of this thought to be due to photophobia), no facial asymmetry, facial sensation intact, hearing intact, tongue/uvula/soft palate midline, normal sternocleidomastoid and trapezius muscle strength. No evidence of tongue atrophy or fibrillations Motor: 5/5 in all 4 extremities Tone: is normal and bulk is normal Sensation- Intact to light touch bilaterally Coordination: FTN intact bilaterally, no ataxia in BLE. Gait- deferred   Medications   Current Facility-Administered Medications:    0.9 %  sodium chloride infusion, , Intravenous, Continuous, Rai, Ripudeep K, MD, Last Rate: 75 mL/hr at 05/20/23 0600, Infusion Verify at 05/20/23 0600   acetaminophen (TYLENOL) tablet 650 mg,  650 mg, Oral, Q6H PRN **OR** acetaminophen (TYLENOL) suppository 650 mg, 650 mg, Rectal, Q6H PRN, Doutova, Anastassia, MD   Chlorhexidine Gluconate Cloth 2 % PADS 6 each, 6 each, Topical, Daily, Doutova, Anastassia, MD, 6 each at 05/19/23 0950   copper tablet 2 mg, 2 mg, Oral, Daily, Rai, Ripudeep K, MD, 2 mg at 05/19/23 1315   cyclobenzaprine (FLEXERIL) tablet 10 mg, 10 mg, Oral, TID PRN, Adela Glimpse, Anastassia, MD   famotidine (PEPCID) tablet 40 mg, 40 mg, Oral, BID, Doutova, Anastassia, MD, 40 mg at 05/19/23 2133   feeding supplement (BOOST / RESOURCE BREEZE) liquid 1 Container, 1 Container, Oral, TID BM, Rai, Ripudeep K, MD, 1 Container at 05/19/23 1318   hydrALAZINE (APRESOLINE) injection 10 mg, 10 mg, Intravenous, Q6H PRN, Rai, Ripudeep K, MD   HYDROmorphone (DILAUDID) injection 0.5-1 mg, 0.5-1 mg, Intravenous, Q2H PRN, Ogan, Okoronkwo U, MD, 1 mg at 05/20/23 0207   ketamine (KETALAR) adult IV infusion 1000mg /122mL (10 mg/mL-Premix), 0.35 mg/kg/hr, Intravenous, Continuous, Erick Blinks, MD, Last Rate: 1.99 mL/hr at 05/20/23 0600, 0.35 mg/kg/hr at 05/20/23 0600   metoprolol tartrate (LOPRESSOR) tablet 25 mg, 25 mg, Oral, BID, Rai, Ripudeep K, MD, 25 mg at 05/19/23 0927   montelukast (SINGULAIR) tablet 10 mg, 10 mg, Oral, Daily PRN, Doutova, Anastassia, MD   multivitamin with minerals tablet 1 tablet, 1 tablet, Oral, Daily, Rai, Ripudeep K, MD   Oral care mouth rinse, 15 mL, Mouth Rinse, PRN, Doutova, Anastassia, MD   pantoprazole (PROTONIX) EC tablet 40 mg, 40 mg, Oral, BID, Doutova, Anastassia,  MD, 40 mg at 05/19/23 2132   promethazine (PHENERGAN) 25 mg in sodium chloride 0.9 % 50 mL IVPB, 25 mg, Intravenous, Q6H PRN, Rai, Ripudeep K, MD, Stopped at 05/20/23 0510   rosuvastatin (CRESTOR) tablet 20 mg, 20 mg, Oral, Daily, Doutova, Anastassia, MD, 20 mg at 05/19/23 2956   Facility-Administered Medications Ordered in Other Encounters:    sodium chloride flush (NS) 0.9 % injection 10 mL, 10  mL, Intracatheter, PRN, Loni Muse, MD, 10 mL at 09/15/22 1258   Labs CBC Labs (Brief)          Component Value Date/Time    WBC 6.4 05/20/2023 0500    RBC 4.50 05/20/2023 0500    HGB 14.0 05/20/2023 0500    HCT 42.4 05/20/2023 0500    PLT 144 (L) 05/20/2023 0500    MCV 94.2 05/20/2023 0500    MCH 31.1 05/20/2023 0500    MCHC 33.0 05/20/2023 0500    RDW 13.4 05/20/2023 0500    LYMPHSABS 1.5 05/10/2023 0648    MONOABS 0.5 05/10/2023 0648    EOSABS 0.0 05/10/2023 0648    BASOSABS 0.0 05/10/2023 0648        CMP     Labs (Brief)          Component Value Date/Time    NA 137 05/20/2023 0500    K 3.7 05/20/2023 0500    CL 107 05/20/2023 0500    CO2 25 05/20/2023 0500    GLUCOSE 104 (H) 05/20/2023 0500    BUN 13 05/20/2023 0500    CREATININE 0.72 05/20/2023 0500    CREATININE 0.67 09/15/2022 1207    CALCIUM 7.7 (L) 05/20/2023 0500    PROT 5.0 (L) 05/19/2023 0615    ALBUMIN 2.7 (L) 05/19/2023 0615    AST 25 05/19/2023 0615    AST 26 09/15/2022 1207    ALT 38 05/19/2023 0615    ALT 33 09/15/2022 1207    ALKPHOS 53 05/19/2023 0615    BILITOT 0.7 05/19/2023 0615    BILITOT 1.0 09/15/2022 1207    GFRNONAA >60 05/20/2023 0500    GFRNONAA >60 09/15/2022 1207    GFRAA >60 06/26/2020 1737        Lipid Panel  Labs (Brief)  No results found for: "CHOL", "TRIG", "HDL", "CHOLHDL", "VLDL", "LDLCALC", "LDLDIRECT"     Recent Labs  No results found for: "HGBA1C"      Imaging I have reviewed images in epic and the results pertinent to this consultation are:   MRI examination of the brain no acute process MRV no dural sinus thrombosis   Assessment:  57 y.o. male who presents with status migrainosus. Third presentation to the ED and failed multiple headache cocktails. He was admitted for DHE but had chest pain after 1st dose and this was d/c'd.transferred to ICU and Ketamine started 7/4 pm. Headache remains 10/10 so will increase infusion rate.    Recommendations: - Increase Ketamine to 0.5mg /kg/hr - stop Dilaudid to avoid rebound h/a - IVF/Hydration - Will continue to follow   Pt seen by Neuro NP/APP and later by MD. Note/plan to be edited by MD as needed.     Lynnae January, DNP, AGACNP-BC [facility] Please use AMION for contact information & EPIC for messaging.     Attending Neurohospitalist Addendum Patient seen and examined with APP/Resident. Agree with the history and physical as documented above. Agree with the plan as documented, which I helped formulate. I have edited the note above to reflect  my full findings and recommendations. I have independently reviewed the chart, obtained history, review of systems and examined the patient.I have personally reviewed pertinent head/neck/spine imaging (CT/MRI). Please feel free to call with any questions.   -- Bing Neighbors, MD [facility] [phone]   If 7pm- 7am, please page neurology on call as listed in AMION.

## 2023-05-22 NOTE — Progress Notes (Addendum)
Neurology Progress Note   S:// Asleep but when awakened he says headache is bad, improved from admission, will not put a number on it.  O:// Current vital signs: BP (!) 165/105 (BP Location: Left Arm)   Pulse 69   Temp 98.2 F (36.8 C) (Oral)   Resp (!) 23   Ht 5\' 8"  (1.727 m)   Wt 55.1 kg   SpO2 100%   BMI 18.47 kg/m  Vital signs in last 24 hours: Temp:  [97.7 F (36.5 C)-98.3 F (36.8 C)] 98.2 F (36.8 C) (07/07 1526) Pulse Rate:  [57-80] 69 (07/07 1526) Resp:  [9-23] 23 (07/07 1526) BP: (94-177)/(63-112) 165/105 (07/07 1500) SpO2:  [92 %-100 %] 100 % (07/07 1526)  GENERAL: Asleep but arousable HEENT: - Normocephalic and atraumatic, dry mm LUNGS - Clear to auscultation bilaterally with no wheezes CV - S1S2 RRR, no m/r/g, equal pulses bilaterally. ABDOMEN - Soft, nontender, nondistended with normoactive BS Ext: warm, well perfused, intact peripheral pulses, no edema   NEURO:  Mental Status: Sleeping, arousable to voice Language: speech is clear  Naming, repetition, fluency, and comprehension intact. Cranial Nerves: PERRL  EOMI, visual fields inconsistent on exam (some of this thought to be due to photophobia), no facial asymmetry, facial sensation intact, hearing intact, tongue/uvula/soft palate midline, normal sternocleidomastoid and trapezius muscle strength. No evidence of tongue atrophy or fibrillations Motor: 5/5 in all 4 extremities Tone: is normal and bulk is normal Sensation- Intact to light touch bilaterally Coordination: FTN intact bilaterally, no ataxia in BLE. Gait- deferred  Medications  Current Facility-Administered Medications:    acetaminophen (TYLENOL) tablet 650 mg, 650 mg, Oral, Q6H PRN, 650 mg at 05/22/23 1617 **OR** acetaminophen (TYLENOL) suppository 650 mg, 650 mg, Rectal, Q6H PRN, Doutova, Anastassia, MD   atorvastatin (LIPITOR) tablet 40 mg, 40 mg, Oral, Daily, Sood, Vineet, MD, 40 mg at 05/22/23 1019   Chlorhexidine Gluconate Cloth 2 % PADS  6 each, 6 each, Topical, Daily, Doutova, Anastassia, MD, 6 each at 05/22/23 1000   copper tablet 2 mg, 2 mg, Oral, Daily, Rai, Ripudeep K, MD, 2 mg at 05/22/23 1020   cyclobenzaprine (FLEXERIL) tablet 10 mg, 10 mg, Oral, TID PRN, Adela Glimpse, Anastassia, MD, 10 mg at 05/22/23 1618   dextrose 5 %-0.9 % sodium chloride infusion, , Intravenous, Continuous, Kamat, Sunil G, MD, Last Rate: 75 mL/hr at 05/22/23 1155, New Bag at 05/22/23 1155   famotidine (PEPCID) tablet 40 mg, 40 mg, Oral, BID, Doutova, Anastassia, MD, 40 mg at 05/22/23 1020   feeding supplement (BOOST / RESOURCE BREEZE) liquid 1 Container, 1 Container, Oral, TID BM, Rai, Ripudeep K, MD, 1 Container at 05/21/23 2023   haloperidol lactate (HALDOL) injection 1 mg, 1 mg, Intravenous, Q6H PRN, Modena Slater, DO, 1 mg at 05/21/23 0758   hydrALAZINE (APRESOLINE) injection 10 mg, 10 mg, Intravenous, Q6H PRN, Rai, Ripudeep K, MD, 10 mg at 05/22/23 1616   ketamine (KETALAR) adult IV infusion (10 mg/mL), 0.75 mg/kg/hr, Intravenous, Continuous, Sood, Vineet, MD, Last Rate: 4.13 mL/hr at 05/22/23 1417, 0.75 mg/kg/hr at 05/22/23 1417   metoprolol tartrate (LOPRESSOR) tablet 25 mg, 25 mg, Oral, BID, Rai, Ripudeep K, MD, 25 mg at 05/22/23 1019   montelukast (SINGULAIR) tablet 10 mg, 10 mg, Oral, Daily PRN, Doutova, Anastassia, MD   multivitamin with minerals tablet 1 tablet, 1 tablet, Oral, Daily, Rai, Ripudeep K, MD, 1 tablet at 05/22/23 1019   naloxone (NARCAN) injection 0.4 mg, 0.4 mg, Intravenous, PRN, Erick Blinks, MD   Oral care  mouth rinse, 15 mL, Mouth Rinse, PRN, Doutova, Anastassia, MD   pantoprazole (PROTONIX) EC tablet 40 mg, 40 mg, Oral, BID, Patel, Amar, DO, 40 mg at 05/22/23 1019   polyvinyl alcohol (LIQUIFILM TEARS) 1.4 % ophthalmic solution 2 drop, 2 drop, Both Eyes, PRN, Jefferson Fuel, MD   promethazine (PHENERGAN) 25 mg in sodium chloride 0.9 % 50 mL IVPB, 25 mg, Intravenous, Q6H PRN, Rai, Ripudeep K, MD, Stopped at 05/22/23  0519  Facility-Administered Medications Ordered in Other Encounters:    sodium chloride flush (NS) 0.9 % injection 10 mL, 10 mL, Intracatheter, PRN, Loni Muse, MD, 10 mL at 09/15/22 1258  Labs CBC    Component Value Date/Time   WBC 8.9 05/21/2023 0539   RBC 4.50 05/21/2023 0539   HGB 14.1 05/21/2023 0539   HCT 43.0 05/21/2023 0539   PLT 154 05/21/2023 0539   MCV 95.6 05/21/2023 0539   MCH 31.3 05/21/2023 0539   MCHC 32.8 05/21/2023 0539   RDW 13.7 05/21/2023 0539   LYMPHSABS 1.5 05/10/2023 0648   MONOABS 0.5 05/10/2023 0648   EOSABS 0.0 05/10/2023 0648   BASOSABS 0.0 05/10/2023 0648    CMP     Component Value Date/Time   NA 136 05/22/2023 0346   K 4.4 05/22/2023 0346   CL 99 05/22/2023 0346   CO2 27 05/22/2023 0346   GLUCOSE 110 (H) 05/22/2023 0346   BUN 16 05/22/2023 0346   CREATININE 0.59 (L) 05/22/2023 0346   CREATININE 0.67 09/15/2022 1207   CALCIUM 8.1 (L) 05/22/2023 0346   PROT 5.0 (L) 05/19/2023 0615   ALBUMIN 2.7 (L) 05/19/2023 0615   AST 25 05/19/2023 0615   AST 26 09/15/2022 1207   ALT 38 05/19/2023 0615   ALT 33 09/15/2022 1207   ALKPHOS 53 05/19/2023 0615   BILITOT 0.7 05/19/2023 0615   BILITOT 1.0 09/15/2022 1207   GFRNONAA >60 05/22/2023 0346   GFRNONAA >60 09/15/2022 1207   GFRAA >60 06/26/2020 1737    Lipid Panel  No results found for: "CHOL", "TRIG", "HDL", "CHOLHDL", "VLDL", "LDLCALC", "LDLDIRECT"  No results found for: "HGBA1C"   Imaging I have reviewed images in epic and the results pertinent to this consultation are:  MRI examination of the brain no acute process MRV no dural sinus thrombosis  Assessment:  57 y.o. male who presents with status migrainosus. Third presentation to the ED and failed multiple headache cocktails. He was admitted for DHE but had chest pain after 1st dose and this was d/c'd.transferred to ICU and Ketamine started 7/4 pm. Headache improved to 8/10, but this is with dilaudid as well, which is not  helpful for longterm control.  Recommendations: - continue ketamine 0.75mg /kg/hr plan to wean 7/8 - No IV dilaudid - When ketamine is weaned will need to discuss appropriate discharge preventive regimen with patient - IVF/Hydration - Will continue to follow  Bing Neighbors, MD [facility] [phone]  If 7pm- 7am, please page neurology on call as listed in AMION.

## 2023-05-22 NOTE — Progress Notes (Signed)
[facility] ADULT ICU REPLACEMENT PROTOCOL   The patient does apply for the [facility] Adult ICU Electrolyte Replacment Protocol based on the criteria listed below:   1.Exclusion criteria: TCTS, ECMO, Dialysis, and Myasthenia Gravis patients 2. Is GFR >/= 30 ml/min? Yes.    Patient's GFR today is >60 3. Is SCr </= 2? Yes.   Patient's SCr is 0.59 mg/dL 4. Did SCr increase >/= 0.5 in 24 hours? No. 5.Pt's weight >40kg  Yes.   6. Abnormal electrolyte(s): Magnesium 1.8  7. Electrolytes replaced per protocol 8.  Call MD STAT for K+ </= 2.5, Phos </= 1, or Mag </= 1 Physician:  Dr. Jones Broom A Zacari Radick 05/22/2023 5:01 AM

## 2023-05-22 NOTE — Progress Notes (Signed)
NAME:  Edwin Hall, MRN:  [id number], DOB:  10/24/1966, LOS: 4 ADMISSION DATE:  05/18/2023, CONSULTATION DATE:  05/19/2023 REFERRING MD:  Gevena Mart, NP , CHIEF COMPLAINT:  Headache    History of Present Illness:  26 yoM with PMH of IBS, HLD, asthma, headache, barretts esophagus s/p partial esophagectomy c/b malabsorption syndrome who was admitted to [facility] 7/3 with 12 days of persistent migraine with bifrontal throbbing pain with blurred vision, nausea and vomiting unresponsive to multiple medications, two ER visits and referred back to ER by neurologist, followed by [facility]. This admit, Neurology consulted, but continued pain despite decadron, dilaudid, ativan, compazine, valproic acid and phenergan. MRI brain and MR venogram negative. DHE (dihydroergotamine) attempted but patient developed severe chest pain and hypertension. Neurology consulting PCCM for transfer to ICU to start ketamine infusion for pain control.   Pertinent  Medical History  He  has a past medical history of Anxiety, Asthma, Barrett's esophagus, Blood transfusion without reported diagnosis, Complication of anesthesia, Food allergy, Headache, Hyperlipidemia, IBS (irritable bowel syndrome), Iron deficiency anemia, and Urticaria.  Significant Hospital Events: Including procedures, antibiotic start and stop dates in addition to other pertinent events   07/03 admitted  07/04 Transferred to ICU for Ketamine infusion  07/07 start dilaudid PCA and d/c ketamine  Interim History / Subjective:  Continues to have 10 out of 10 headache.  Episodes of nausea and vomiting this morning.  Objective   BP (!) 177/105   Pulse (!) 59   Temp 98.3 F (36.8 C) (Axillary)   Resp 17   Ht 5\' 8"  (1.727 m)   Wt 55.1 kg   SpO2 99%   BMI 18.47 kg/m   Examination:  General - alert, appears uncomfortable Eyes - pupils reactive ENT - no sinus tenderness, no stridor Cardiac - regular rate/rhythm, no murmur Chest - equal breath  sounds b/l, no wheezing or rales Abdomen - soft, non tender, + bowel sounds Extremities - no cyanosis, clubbing, or edema Skin - no rashes Neuro - normal strength, moves extremities, follows commands  Resolved Hospital Problem list     Assessment & Plan:   Intractable migraine with nausea and vomiting. Hx of anxiety. - neuro recommending continuing ketamine at 1 mg/kg/hr for now and stop dilaudid - prn phenergan, haldol, tylenol  Hx of HLD, HTN. - continue lipitor, lopressor   Moderate protein calorie malnutrition. - encourage oral intake as tolerated  Hx of asthma, urticaria. - prn albuterol, singulair  Hx of Barrett's esophagus. - continue protonix bid, pepcid bid  Best Practice (right click and "Reselect all SmartList Selections" daily)   Diet/type: Regular consistency (see orders) DVT prophylaxis: SCD GI prophylaxis: H2B Lines: N/A Foley:  N/A Code Status:  full code Last date of multidisciplinary goals of care discussion  Labs       Latest Ref Rng & Units 05/22/2023    3:46 AM 05/21/2023    5:39 AM 05/20/2023    5:00 AM  CMP  Glucose 70 - 99 mg/dL 409  88  811   BUN 6 - 20 mg/dL 16  20  13    Creatinine 0.61 - 1.24 mg/dL 9.14  7.82  9.56   Sodium 135 - 145 mmol/L 136  136  137   Potassium 3.5 - 5.1 mmol/L 4.4  4.2  3.7   Chloride 98 - 111 mmol/L 99  106  107   CO2 22 - 32 mmol/L 27  23  25    Calcium 8.9 - 10.3 mg/dL  8.1  7.7  7.7        Latest Ref Rng & Units 05/21/2023    5:39 AM 05/20/2023    5:00 AM 05/19/2023    6:15 AM  CBC  WBC 4.0 - 10.5 K/uL 8.9  6.4  6.9   Hemoglobin 13.0 - 17.0 g/dL 16.1  09.6  04.5   Hematocrit 39.0 - 52.0 % 43.0  42.4  40.1   Platelets 150 - 400 K/uL 154  144  151     CBG (last 3)  Recent Labs    05/21/23 2326 05/22/23 0342 05/22/23 0738  GLUCAP 80 108* 122*    Critical care time: 34 minutes  Coralyn Helling, MD [facility] [facility]/Critical [facility] Pager - [phone] or [phone] 05/22/2023, 9:40 AM

## 2023-05-23 DIAGNOSIS — G43E11 Chronic migraine with aura, intractable, with status migrainosus: Secondary | ICD-10-CM | POA: Diagnosis not present

## 2023-05-23 LAB — CBC
HCT: 39.4 % (ref 39.0–52.0)
Hemoglobin: 13.5 g/dL (ref 13.0–17.0)
MCH: 31.2 pg (ref 26.0–34.0)
MCHC: 34.3 g/dL (ref 30.0–36.0)
MCV: 91 fL (ref 80.0–100.0)
Platelets: 143 10*3/uL — ABNORMAL LOW (ref 150–400)
RBC: 4.33 MIL/uL (ref 4.22–5.81)
RDW: 13 % (ref 11.5–15.5)
WBC: 8.4 10*3/uL (ref 4.0–10.5)
nRBC: 0 % (ref 0.0–0.2)

## 2023-05-23 LAB — BASIC METABOLIC PANEL
Anion gap: 4 — ABNORMAL LOW (ref 5–15)
BUN: 8 mg/dL (ref 6–20)
CO2: 24 mmol/L (ref 22–32)
Calcium: 7.9 mg/dL — ABNORMAL LOW (ref 8.9–10.3)
Chloride: 98 mmol/L (ref 98–111)
Creatinine, Ser: 0.4 mg/dL — ABNORMAL LOW (ref 0.61–1.24)
GFR, Estimated: >60 mL/min (ref >60)
Glucose, Bld: 131 mg/dL — ABNORMAL HIGH (ref 70–99)
Potassium: 3.6 mmol/L (ref 3.5–5.1)
Sodium: 127 mmol/L — ABNORMAL LOW (ref 135–145)

## 2023-05-23 LAB — GLUCOSE, CAPILLARY
Glucose-Capillary: 119 mg/dL — ABNORMAL HIGH (ref 70–99)
Glucose-Capillary: 116 mg/dL — ABNORMAL HIGH (ref 70–99)
Glucose-Capillary: 88 mg/dL (ref 70–99)
Glucose-Capillary: 101 mg/dL — ABNORMAL HIGH (ref 70–99)
Glucose-Capillary: 75 mg/dL (ref 70–99)
Glucose-Capillary: 64 mg/dL — ABNORMAL LOW (ref 70–99)

## 2023-05-23 LAB — PHOSPHORUS: Phosphorus: 2.6 mg/dL (ref 2.5–4.6)

## 2023-05-23 LAB — MAGNESIUM: Magnesium: 1.6 mg/dL — ABNORMAL LOW (ref 1.7–2.4)

## 2023-05-23 MED ORDER — POLYETHYLENE GLYCOL 3350 17 G PO PACK: 17.0000 g | PACK | Freq: Every day | ORAL | Status: DC

## 2023-05-23 MED ORDER — MAGNESIUM SULFATE 4 GM/100ML IV SOLN
4.0000 g | Freq: Once | INTRAVENOUS | Status: AC
  Administered 2023-05-23: 4 g via INTRAVENOUS
  Filled 2023-05-23: qty 100

## 2023-05-23 MED ORDER — SENNA 8.6 MG PO TABS: 2.0000 | ORAL_TABLET | Freq: Every day | ORAL | Status: DC

## 2023-05-23 MED ORDER — POTASSIUM CHLORIDE CRYS ER 20 MEQ PO TBCR
40.0000 meq | EXTENDED_RELEASE_TABLET | Freq: Once | ORAL | Status: AC
  Administered 2023-05-23: 40 meq via ORAL
  Filled 2023-05-23: qty 2

## 2023-05-23 MED ORDER — DOCUSATE SODIUM 100 MG PO CAPS: 100.0000 mg | ORAL_CAPSULE | Freq: Two times a day (BID) | ORAL | Status: DC

## 2023-05-23 MED ORDER — SENNOSIDES-DOCUSATE SODIUM 8.6-50 MG PO TABS
1.0000 | ORAL_TABLET | Freq: Two times a day (BID) | ORAL | Status: DC
  Administered 2023-05-23: 1 via ORAL
  Filled 2023-05-23: qty 1

## 2023-05-23 MED ORDER — METHOCARBAMOL 1000 MG/10ML IJ SOLN
1000.0000 mg | Freq: Four times a day (QID) | INTRAVENOUS | Status: DC
  Administered 2023-05-23 – 2023-05-24 (×3): 1000 mg via INTRAVENOUS
  Filled 2023-05-23 (×2): qty 10
  Filled 2023-05-23 (×3): qty 1000

## 2023-05-23 MED ORDER — ENOXAPARIN SODIUM 40 MG/0.4ML IJ SOSY
40.0000 mg | PREFILLED_SYRINGE | Freq: Every day | INTRAMUSCULAR | Status: DC
  Administered 2023-05-23 – 2023-05-25 (×3): 40 mg via SUBCUTANEOUS
  Filled 2023-05-23 (×3): qty 0.4

## 2023-05-23 NOTE — Progress Notes (Addendum)
Patient is alert and oriented to person and place however he continues to ask where President Biden and the vice president and chief of staff are. He states his headache is 10 out of 10 on pain scale but he easily drifts off to sleep.Dr. Merrily Pew updated

## 2023-05-23 NOTE — Progress Notes (Addendum)
Neurology Progress Note   S:// Lethargic but easy to arouse to verbal stimulus.   Unable to provide history today.  All history obtained through chart review except for intermittent pain  Subjective: Patient states has not gained any relief since admitted but I spoke with ICU physician, who states he did get to a lower 8/10 pain although was not able to ascertain which medication was helpful.  He tells Thereasa Parkin it is 10/10 pain and he has gotten no relief.  Patient received as needed Tylenol and as needed tizanidine yesterday.  He got 1 dose of Phenergan yesterday as well.  ------------------- Overview of history  Upon chart review: Has failed triptan, DHE therapy as well as NSAIDs.  In the past, it seems he has responded well to Decadron, most recently 05/10/2023.  He was seen outpatient this past May, at which time amitriptyline was prescribed at a dose of 50 mg nightly, magnesium 400, riboflavin 400, and caffeine recommended prophylactically. Abortive Nurtec, Flexeril, and Phenergan.  Previously, Tylenol was ineffective; he avoided NSAIDs secondary to GERD; aspirin was ineffective; fioricet had worsened headaches; Imitrex and Maxalt ineffective; Phenergan helpful; Flexeril helpful taken about once per day; he states prednisone was not helpful but again Decadron noted to be helpful per documentation.  Other also notes hide order to be nasal spray and Aimovig ordered, unclear if took these medications.  Upon chart review, it seems that his headaches developed following a hospitalization at [facility] in 11/2021 for partial esophagectomy and status post fundoplication for refractory GERD.  His course was complicated by septic shock requiring 1 month in the ICU with ventilation, tracheostomy, but he has recovered relatively well.  Headaches have been gradually progressive in both severity and frequency and he tends to have daily headaches that are usually debilitating.  Although he denies this  currently, they are usually present with nausea, vomiting, light and sound sensitivity as well as a more infrequent visual aura of shining light in peripheral visual fields lasting 10 minutes.  Duration of headache is typically 6-8 hours based on documentation.  O:// Current vital signs: BP (!) 142/98   Pulse 93   Temp 98.4 F (36.9 C) (Oral)   Resp 15   Ht 5\' 8"  (1.727 m)   Wt 55.1 kg   SpO2 (!) 89%   BMI 18.47 kg/m  Vital signs in last 24 hours: Temp:  [97.7 F (36.5 C)-98.4 F (36.9 C)] 98.4 F (36.9 C) (07/08 0728) Pulse Rate:  [59-93] 93 (07/08 1000) Resp:  [11-23] 15 (07/08 1000) BP: (115-172)/(86-110) 142/98 (07/08 1000) SpO2:  [89 %-100 %] 89 % (07/08 1000)  GENERAL: Asleep but arousable HEENT: - Normocephalic and atraumatic, dry mm LUNGS - Clear to auscultation bilaterally with no wheezes CV - S1S2 RRR, no m/r/g, equal pulses (including temporal) bilaterally. ABDOMEN - Soft, nontender, nondistended with normoactive BS Ext: warm, well perfused, intact peripheral pulses, no edema   NEURO:  Mental Status: Sleeping, arousable to voice Language: Speech is clear. AAOx 2 to place and time, but not name, states does not know who president is then later able to answer this question.  Naming, repetition, fluency, and comprehension overall intact within limits of bradyphrenia . Cranial Nerves: PERRL  EOMI, visual fields inconsistent on exam previously but intact today (some of this thought to be due to photophobia), no facial asymmetry, facial sensation intact, hearing intact, tongue/uvula/soft palate midline, normal sternocleidomastoid and trapezius muscle strength. No evidence of tongue atrophy or fasciculations Motor: 5/5 in  all 4 extremities DTRs: 2 throughout  Tone: is normal and bulk is normal Sensation- Intact to light touch bilaterally Coordination: FTN intact bilaterally, no ataxia in BLE. Gait- Deferred  Medications  Current Facility-Administered Medications:     acetaminophen (TYLENOL) tablet 650 mg, 650 mg, Oral, Q6H PRN, 650 mg at 05/22/23 1617 **OR** acetaminophen (TYLENOL) suppository 650 mg, 650 mg, Rectal, Q6H PRN, Doutova, Anastassia, MD   atorvastatin (LIPITOR) tablet 40 mg, 40 mg, Oral, Daily, Craige Cotta, Vineet, MD, 40 mg at 05/23/23 1610   Chlorhexidine Gluconate Cloth 2 % PADS 6 each, 6 each, Topical, Daily, Doutova, Anastassia, MD, 6 each at 05/22/23 1000   copper tablet 2 mg, 2 mg, Oral, Daily, Rai, Ripudeep K, MD, 2 mg at 05/23/23 0912   cyclobenzaprine (FLEXERIL) tablet 10 mg, 10 mg, Oral, TID PRN, Therisa Doyne, MD, 10 mg at 05/22/23 2352   dextrose 5 %-0.9 % sodium chloride infusion, , Intravenous, Continuous, Kamat, Sunil G, MD, Last Rate: 75 mL/hr at 05/23/23 1000, Infusion Verify at 05/23/23 1000   enoxaparin (LOVENOX) injection 40 mg, 40 mg, Subcutaneous, Daily, Allena Katz, Amar, DO   famotidine (PEPCID) tablet 40 mg, 40 mg, Oral, BID, Doutova, Anastassia, MD, 40 mg at 05/23/23 0912   feeding supplement (BOOST / RESOURCE BREEZE) liquid 1 Container, 1 Container, Oral, TID BM, Rai, Ripudeep K, MD, 1 Container at 05/21/23 2023   haloperidol lactate (HALDOL) injection 1 mg, 1 mg, Intravenous, Q6H PRN, Modena Slater, DO, 1 mg at 05/21/23 0758   ketamine (KETALAR) adult IV infusion (10 mg/mL), 0.75 mg/kg/hr, Intravenous, Continuous, Sood, Vineet, MD, Last Rate: 4.13 mL/hr at 05/23/23 1000, 0.75 mg/kg/hr at 05/23/23 1000   magnesium sulfate IVPB 4 g 100 mL, 4 g, Intravenous, Once, Bell, Lorin C, RPH, Last Rate: 50 mL/hr at 05/23/23 1000, Infusion Verify at 05/23/23 1000   metoprolol tartrate (LOPRESSOR) tablet 25 mg, 25 mg, Oral, BID, Rai, Ripudeep K, MD, 25 mg at 05/23/23 0912   montelukast (SINGULAIR) tablet 10 mg, 10 mg, Oral, Daily PRN, Therisa Doyne, MD   multivitamin with minerals tablet 1 tablet, 1 tablet, Oral, Daily, Rai, Ripudeep K, MD, 1 tablet at 05/23/23 0911   Oral care mouth rinse, 15 mL, Mouth Rinse, PRN, Adela Glimpse, Anastassia,  MD   pantoprazole (PROTONIX) EC tablet 40 mg, 40 mg, Oral, BID, Allena Katz, Amar, DO, 40 mg at 05/23/23 9604   polyvinyl alcohol (LIQUIFILM TEARS) 1.4 % ophthalmic solution 2 drop, 2 drop, Both Eyes, PRN, Jefferson Fuel, MD   promethazine (PHENERGAN) 25 mg in sodium chloride 0.9 % 50 mL IVPB, 25 mg, Intravenous, Q6H PRN, Rai, Ripudeep K, MD, Stopped at 05/22/23 0519   senna-docusate (Senokot-S) tablet 1 tablet, 1 tablet, Oral, BID, Bell, Lorin C, RPH  Facility-Administered Medications Ordered in Other Encounters:    sodium chloride flush (NS) 0.9 % injection 10 mL, 10 mL, Intracatheter, PRN, Loni Muse, MD, 10 mL at 09/15/22 1258  Labs CBC    Component Value Date/Time   WBC 8.4 05/23/2023 0800   RBC 4.33 05/23/2023 0800   HGB 13.5 05/23/2023 0800   HCT 39.4 05/23/2023 0800   PLT 143 (L) 05/23/2023 0800   MCV 91.0 05/23/2023 0800   MCH 31.2 05/23/2023 0800   MCHC 34.3 05/23/2023 0800   RDW 13.0 05/23/2023 0800   LYMPHSABS 1.5 05/10/2023 0648   MONOABS 0.5 05/10/2023 0648   EOSABS 0.0 05/10/2023 0648   BASOSABS 0.0 05/10/2023 0648    CMP     Component Value Date/Time  NA 127 (L) 05/23/2023 0800   K 3.6 05/23/2023 0800   CL 98 05/23/2023 0800   CO2 24 05/23/2023 0800   GLUCOSE 131 (H) 05/23/2023 0800   BUN 8 05/23/2023 0800   CREATININE 0.40 (L) 05/23/2023 0800   CREATININE 0.67 09/15/2022 1207   CALCIUM 7.9 (L) 05/23/2023 0800   PROT 5.0 (L) 05/19/2023 0615   ALBUMIN 2.7 (L) 05/19/2023 0615   AST 25 05/19/2023 0615   AST 26 09/15/2022 1207   ALT 38 05/19/2023 0615   ALT 33 09/15/2022 1207   ALKPHOS 53 05/19/2023 0615   BILITOT 0.7 05/19/2023 0615   BILITOT 1.0 09/15/2022 1207   GFRNONAA >60 05/23/2023 0800   GFRNONAA >60 09/15/2022 1207   GFRAA >60 06/26/2020 1737    Lipid Panel  No results found for: "CHOL", "TRIG", "HDL", "CHOLHDL", "VLDL", "LDLCALC", "LDLDIRECT"  No results found for: "HGBA1C"   Imaging I have reviewed images in epic and the results  pertinent to this consultation are:  MRI examination of the brain no acute process MRV no dural sinus thrombosis  Assessment:  57 y.o. male who presents with status migrainosus versus exacerbation of another type of chronic daily headache, with intractable tension headache included in the differential. He has suffered from chronic daily headaches with gradually progressive course beginning sometime after his admission to [facility] in 11/2021 for esophagectomy for refractory GERD with a course complicated by septic shock, mechanical ventilation, and tracheostomy-now removed. This is his third recent presentation to the ED and failed multiple headache cocktails.  - He was admitted for DHE but had chest pain after 1st dose and this was d/c'd.  - Subsequently transferred to ICU and Ketamine started 7/4 pm. Headache improved to 8/10, but this is was with Dilaudid on board as well, which is not helpful for longterm control. - He endorses no relief s/p the following: PRN muscle relaxant, phenergan, 4g IV magnesium on 7/7, IV fluids, IV Haldol, toradol, benadryl, aimovig, maxalt, nurtec, fioricet, diclofenac, 4mg  decadron, 1g depakote, and  Dilaudid prn.   Recommendations: -wean ketamine gtt -once weaned off ketamine, start IV robaxin 1 g QID around the clock x 24 hours  to evaluate for relief. If effective, then tension type headache would be the most likely etiology for his presentation -stop flexeril once starting robaxin -depending on response, will consider topiramate, amitriptyline, or nurtec for long term preventative solution -continue mag ox and riboflavin daily  -No IV dilaudid -avoid overuse of NSAIDs, APAP, fioricet to avoid medication overuse headaches  -also dicussed recommendation of more holistic therapies such as scalp and neck massage, relaxation techniques, CBT, etc.  - IVF/Hydration - Will continue to follow   Sanjuana Letters, PA-C Neurology   Electronically signed: Dr. Caryl Pina

## 2023-05-23 NOTE — Progress Notes (Signed)
NAME:  Edwin Hall, MRN:  [id number], DOB:  [birth year], LOS: 5 ADMISSION DATE:  05/18/2023, CONSULTATION DATE:  05/19/2023 REFERRING MD:  Gevena Mart, NP , CHIEF COMPLAINT:  Headache    History of Present Illness:  52 yoM with PMH of IBS, HLD, asthma, headache, barretts esophagus s/p partial esophagectomy c/b malabsorption syndrome who was admitted to [facility] 7/3 with 12 days of persistent migraine with bifrontal throbbing pain with blurred vision, nausea and vomiting unresponsive to multiple medications, two ER visits and referred back to ER by neurologist, followed by [facility]. This admit, Neurology consulted, but continued pain despite decadron, dilaudid, ativan, compazine, valproic acid and phenergan. MRI brain and MR venogram negative. DHE (dihydroergotamine) attempted but patient developed severe chest pain and hypertension. Neurology consulting PCCM for transfer to ICU to start ketamine infusion for pain control.   Pertinent  Medical History  He  has a past medical history of Anxiety, Asthma, Barrett's esophagus, Blood transfusion without reported diagnosis, Complication of anesthesia, Food allergy, Headache, Hyperlipidemia, IBS (irritable bowel syndrome), Iron deficiency anemia, and Urticaria.  Significant Hospital Events: Including procedures, antibiotic start and stop dates in addition to other pertinent events   07/03 admitted  07/04 Transferred to ICU for Ketamine infusion  07/07 start dilaudid PCA and d/c ketamine  Interim History / Subjective:  No overnight events.  Patient evaluated at bedside this morning.  Patient reports she is still having a throbbing headache.  He describes it as a changes with these headaches as well as lacrimation.  He is unsure of what makes it better.  He states that it is constant.  Objective   BP 129/87   Pulse 65   Temp 98.3 F (36.8 C) (Oral)   Resp 14   Ht 5\' 8"  (1.727 m)   Wt 55.1 kg   SpO2 100%   BMI 18.47 kg/m temperature  97.7-98.3 F, pulse 57-82, respiration 11-23, blood pressure 115/94-172/106, satting at 100% on room air  Examination:  General -alert, slightly distressed able to track a probe Eyes -able to track appropriately Cardiac -regular rate and rhythm, no murmurs, rubs, or gallops Chest -clear to auscultation bilaterally Abdomen -soft, with some lower abdominal tenderness appreciated, normoactive bowel sounds Extremities -cold extremities, no no edema appreciated Skin - no rashes appreciated Neuro follows all instructions  Labs 07/08 Glucose 99-119   Resolved Hospital Problem list     Assessment & Plan:   Intractable migraine with nausea and vomiting. Hx of anxiety. Patient evaluated bedside this morning.  Vital signs are stable.  Patient does have some hypertension appreciated.  Patient currently on ketamine and neuro explained to wean off today.  Unclear why patient is still having intractable migraines with nausea and vomiting.  He reports nothing is helping.  Patient is hypertensive, and I do wonder if this could be playing a role.  Otherwise imaging does not point towards structural etiology.  Will continue to follow along with. -Neuro plans to wean off ketamine today, start robaxin  -Continue with as needed Phenergan, Haldol, Tylenol -Continue as needed Flexeril -Could potentially try dihydroergotamine the patient can tolerate this time  Hx of HLD, HTN. Patient has had elevated blood pressures, I do wonder if this is contributing to headache.  Could also be the reverse with headaches contributing to elevated blood pressures. -Continue Lipitor 40 mg daily -Continue Lopressor 25 mg twice daily -As needed hydralazine for systolic blood pressure greater than 165   Moderate protein calorie malnutrition. Patient is tolerating diet  well. -Encourage oral intake  Hx of asthma, urticaria. No concern for respiratory concerns at this moment. -Continue Singulair -Continue as needed  albuterol  Hx of Barrett's esophagus. Patient does have some abdominal tenderness today, I do not think this is related. -Continue Protonix twice daily -Continue Pepcid twice daily  Best Practice (right click and "Reselect all SmartList Selections" daily)   Diet/type: Regular consistency (see orders) DVT prophylaxis: SCD GI prophylaxis: H2B Lines: N/A Foley:  N/A Code Status:  full code Last date of multidisciplinary goals of care discussion  Labs       Latest Ref Rng & Units 05/22/2023    3:46 AM 05/21/2023    5:39 AM 05/20/2023    5:00 AM  CMP  Glucose 70 - 99 mg/dL 409  88  811   BUN 6 - 20 mg/dL 16  20  13    Creatinine 0.61 - 1.24 mg/dL 9.14  7.82  9.56   Sodium 135 - 145 mmol/L 136  136  137   Potassium 3.5 - 5.1 mmol/L 4.4  4.2  3.7   Chloride 98 - 111 mmol/L 99  106  107   CO2 22 - 32 mmol/L 27  23  25    Calcium 8.9 - 10.3 mg/dL 8.1  7.7  7.7        Latest Ref Rng & Units 05/21/2023    5:39 AM 05/20/2023    5:00 AM 05/19/2023    6:15 AM  CBC  WBC 4.0 - 10.5 K/uL 8.9  6.4  6.9   Hemoglobin 13.0 - 17.0 g/dL 21.3  08.6  57.8   Hematocrit 39.0 - 52.0 % 43.0  42.4  40.1   Platelets 150 - 400 K/uL 154  144  151     CBG (last 3)  Recent Labs    05/22/23 1918 05/22/23 2313 05/23/23 0308  GLUCAP 99 108* 119*     Critical care time: 34 minutes  Modena Slater, DO Internal medicine resident PGY-2 05/23/2023, 7:10 AM

## 2023-05-24 DIAGNOSIS — R112 Nausea with vomiting, unspecified: Secondary | ICD-10-CM | POA: Diagnosis not present

## 2023-05-24 DIAGNOSIS — G43E11 Chronic migraine with aura, intractable, with status migrainosus: Secondary | ICD-10-CM | POA: Diagnosis not present

## 2023-05-24 DIAGNOSIS — E440 Moderate protein-calorie malnutrition: Secondary | ICD-10-CM | POA: Diagnosis not present

## 2023-05-24 DIAGNOSIS — G43901 Migraine, unspecified, not intractable, with status migrainosus: Secondary | ICD-10-CM | POA: Diagnosis not present

## 2023-05-24 LAB — BASIC METABOLIC PANEL
Anion gap: 10 (ref 5–15)
BUN: 8 mg/dL (ref 6–20)
CO2: 22 mmol/L (ref 22–32)
Calcium: 8.2 mg/dL — ABNORMAL LOW (ref 8.9–10.3)
Chloride: 103 mmol/L (ref 98–111)
Creatinine, Ser: 0.51 mg/dL — ABNORMAL LOW (ref 0.61–1.24)
GFR, Estimated: >60 mL/min (ref >60)
Glucose, Bld: 102 mg/dL — ABNORMAL HIGH (ref 70–99)
Potassium: 4 mmol/L (ref 3.5–5.1)
Sodium: 135 mmol/L (ref 135–145)

## 2023-05-24 LAB — GLUCOSE, CAPILLARY
Glucose-Capillary: 111 mg/dL — ABNORMAL HIGH (ref 70–99)
Glucose-Capillary: 120 mg/dL — ABNORMAL HIGH (ref 70–99)
Glucose-Capillary: 154 mg/dL — ABNORMAL HIGH (ref 70–99)
Glucose-Capillary: 63 mg/dL — ABNORMAL LOW (ref 70–99)
Glucose-Capillary: 65 mg/dL — ABNORMAL LOW (ref 70–99)
Glucose-Capillary: 67 mg/dL — ABNORMAL LOW (ref 70–99)
Glucose-Capillary: 87 mg/dL (ref 70–99)
Glucose-Capillary: 99 mg/dL (ref 70–99)

## 2023-05-24 LAB — PHOSPHORUS: Phosphorus: 2.7 mg/dL (ref 2.5–4.6)

## 2023-05-24 LAB — MAGNESIUM: Magnesium: 1.9 mg/dL (ref 1.7–2.4)

## 2023-05-24 MED ORDER — DEXTROSE 10 % IV SOLN: INTRAVENOUS | Status: DC

## 2023-05-24 MED ORDER — MAGNESIUM SULFATE 2 GM/50ML IV SOLN
2.0000 g | Freq: Once | INTRAVENOUS | Status: AC
  Administered 2023-05-24: 2 g via INTRAVENOUS
  Filled 2023-05-24: qty 50

## 2023-05-24 MED ORDER — POLYETHYLENE GLYCOL 3350 17 G PO PACK
17.0000 g | PACK | Freq: Two times a day (BID) | ORAL | Status: DC
  Administered 2023-05-24 – 2023-05-25 (×2): 17 g via ORAL
  Filled 2023-05-24 (×2): qty 1

## 2023-05-24 MED ORDER — OXYCODONE HCL 5 MG PO TABS
5.0000 mg | ORAL_TABLET | Freq: Four times a day (QID) | ORAL | Status: DC | PRN
  Administered 2023-05-24 – 2023-05-25 (×2): 5 mg via ORAL
  Filled 2023-05-24 (×2): qty 1

## 2023-05-24 MED ORDER — SENNOSIDES-DOCUSATE SODIUM 8.6-50 MG PO TABS: 1.0000 | ORAL_TABLET | Freq: Two times a day (BID) | ORAL | Status: DC | PRN

## 2023-05-24 MED ORDER — METHOCARBAMOL 1000 MG/10ML IJ SOLN
1000.0000 mg | Freq: Four times a day (QID) | INTRAVENOUS | Status: DC
  Administered 2023-05-24 – 2023-05-25 (×4): 1000 mg via INTRAVENOUS
  Filled 2023-05-24: qty 1000
  Filled 2023-05-24 (×2): qty 10
  Filled 2023-05-24: qty 1000

## 2023-05-24 NOTE — Progress Notes (Signed)
eLink Physician-Brief Progress Note Patient Name: Edwin Hall DOB: 01/20/1966 MRN: [id number]   Date of Service  05/24/2023  HPI/Events of Note  57 year old male that initially presented with status migrainosus he has been receiving intermittent ketamine, Robaxin, and other analgesics per neurology.  Has had episodes of hypoglycemia and generally has poor IV access.  D5 NS was discontinued but he has been having low blood sugars including last at 64.    eICU Interventions  Reinitiate D10 drip at 20 cc.  If there are any compatibility issues with other medications, hold D10 infusion while other meds are infusing.    Intervention Category Intermediate Interventions: Hyperglycemia - evaluation and treatment  [place]oteau 05/24/2023, 12:10 AM

## 2023-05-24 NOTE — Progress Notes (Signed)
NAME:  Edwin Hall, MRN:  [id number], DOB:  12-06-1965, LOS: 6 ADMISSION DATE:  05/18/2023, CONSULTATION DATE:  05/19/2023 REFERRING MD:  Gevena Mart, NP , CHIEF COMPLAINT:  Headache    History of Present Illness:  37 yoM with PMH of IBS, HLD, asthma, headache, barretts esophagus s/p partial esophagectomy c/b malabsorption syndrome who was admitted to [facility] 7/3 with 12 days of persistent migraine with bifrontal throbbing pain with blurred vision, nausea and vomiting unresponsive to multiple medications, two ER visits and referred back to ER by neurologist, followed by [facility]. This admit, Neurology consulted, but continued pain despite decadron, dilaudid, ativan, compazine, valproic acid and phenergan. MRI brain and MR venogram negative. DHE (dihydroergotamine) attempted but patient developed severe chest pain and hypertension. Neurology consulting PCCM for transfer to ICU to start ketamine infusion for pain control.   Pertinent  Medical History  He  has a past medical history of Anxiety, Asthma, Barrett's esophagus, Blood transfusion without reported diagnosis, Complication of anesthesia, Food allergy, Headache, Hyperlipidemia, IBS (irritable bowel syndrome), Iron deficiency anemia, and Urticaria.  Significant Hospital Events: Including procedures, antibiotic start and stop dates in addition to other pertinent events   07/03 admitted  07/04 Transferred to ICU for Ketamine infusion  07/07 start dilaudid PCA and d/c ketamine  Interim History / Subjective:   Overnight no acute events.  Off ketamine.  Evaluated bedside this morning. He states that he is having abdominal pain. He reports epigastric pain. No vomiting or diarrhea.  He states he does not have much of appetite this morning.  Objective   BP (!) 132/95   Pulse 89   Temp 98.2 F (36.8 C) (Oral)   Resp 19   Ht 5\' 8"  (1.727 m)   Wt 55.1 kg   SpO2 97%   BMI 18.47 kg/m temperature 97.7-98.3 F, pulse 57-82, respiration  11-23, blood pressure 115/94-172/106, satting at 100% on room air  Examination:  General -alert, no acute distress Eyes -able to track appropriately Cardiac -regular rate and rhythm, no murmurs, rubs, or gallops Chest -clear to auscultation bilaterally Abdomen -some epigastric tenderness appreciated Extremities -no edema appreciated Skin - no rashes appreciated Neuro follows all instructions  Labs 07/19 Labs: Sodium 135, creatinine 1.51, calcium 9.2, GFR greater than 60 Magnesium 1.9 Phosphorus 2.7  Glucose 63-111   Resolved Hospital Problem list     Assessment & Plan:   Intractable migraine with nausea and vomiting. Hx of anxiety. Migraine has resolved with IV Robaxin.  Patient has been weaned off ketamine.  Patient is much more relaxed this morning.  No acute concerns. -Continue IV Robaxin -Continue as needed Phenergan, Haldol, Tylenol -Continue Flexeril -Neurology following, safe to transfer to floor  Abdominal pain Patient has epigastric abdominal pain this morning.  Patient denies any alcohol use or gallbladder.  Likely related to GERD.  No acute concerns at this time. -Do not think this is pancreatitis -Monitor closely  Hx of HLD, HTN. No acute concerns.  Blood pressure measuring well.   -Continue Lipitor 40 mg daily -Continue Lopressor 25 mg twice daily   Moderate protein calorie malnutrition. Patient is tolerating diet well. -Encourage oral intake  Hx of asthma, urticaria. No concern for respiratory concerns at this moment. -Continue Singulair -Continue as needed albuterol  Hx of Barrett's esophagus. Patient does have some abdominal tenderness today, I do not think this is related. -Continue Protonix twice daily -Continue Pepcid twice daily  Best Practice (right click and "Reselect all SmartList Selections" daily)  Diet/type: Regular consistency (see orders) DVT prophylaxis: LMWH GI prophylaxis: H2B Lines: N/A Foley:  N/A Code Status:  full  code Last date of multidisciplinary goals of care discussion  Labs       Latest Ref Rng & Units 05/24/2023    3:02 AM 05/23/2023    8:00 AM 05/22/2023    3:46 AM  CMP  Glucose 70 - 99 mg/dL 621  308  657   BUN 6 - 20 mg/dL 8  8  16    Creatinine 0.61 - 1.24 mg/dL 8.46  9.62  9.52   Sodium 135 - 145 mmol/L 135  127  136   Potassium 3.5 - 5.1 mmol/L 4.0  3.6  4.4   Chloride 98 - 111 mmol/L 103  98  99   CO2 22 - 32 mmol/L 22  24  27    Calcium 8.9 - 10.3 mg/dL 8.2  7.9  8.1        Latest Ref Rng & Units 05/23/2023    8:00 AM 05/21/2023    5:39 AM 05/20/2023    5:00 AM  CBC  WBC 4.0 - 10.5 K/uL 8.4  8.9  6.4   Hemoglobin 13.0 - 17.0 g/dL 84.1  32.4  40.1   Hematocrit 39.0 - 52.0 % 39.4  43.0  42.4   Platelets 150 - 400 K/uL 143  154  144     CBG (last 3)  Recent Labs    05/24/23 0324 05/24/23 0334 05/24/23 0721  GLUCAP 63* 111* 65*     Critical care time: 34 minutes  Modena Slater, DO Internal medicine resident PGY-2 05/24/2023, 8:05 AM

## 2023-05-24 NOTE — Progress Notes (Signed)
Tele box called in and verified. Box [id number]

## 2023-05-24 NOTE — Progress Notes (Signed)
Pt reporting sharp stomach pain 8/10 states it started earlier today. Received tylenol around 1600; unable to give again until 2200. Last BM was reportedly 2 hours ago and bowel sounds are hypoactive. Pt states they ate some lunch today but hasn't touched their dinner yet. CCM E-link Provider called; awaiting feedback.

## 2023-05-24 NOTE — Progress Notes (Signed)
Patient arrived to 6 north room 28 alert and oriented. Pain level 4/10. Chest port accessed, dressing clean dry and intact with dextrose 10% running at 20. Bed in lowest position, call light in reach. Urinal in reach. Bed alarm on. Will continue to monitor pt.

## 2023-05-24 NOTE — Progress Notes (Signed)
eLink Physician-Brief Progress Note Patient Name: Edwin Hall DOB: [birth year] MRN: [id number]   Date of Service  05/24/2023  HPI/Events of Note  57 year old male with intractable migraine seems to be better with ketamine and now on Robaxin.  Having severe sharp abdominal pain 8 out of 10 which is unresponsive to acetaminophen.  eICU Interventions  As needed oxycodone and a bowel regimen added     Intervention Category Intermediate Interventions: Pain - evaluation and management  Nikeshia Keetch 05/24/2023, 8:57 PM

## 2023-05-24 NOTE — Progress Notes (Signed)
Lunch tray reordered.

## 2023-05-24 NOTE — Progress Notes (Addendum)
Hypoglycemic Event  CBG: 65  Treatment: 8 oz juice/soda  Symptoms: None  Follow-up CBG: [id number] CBG Result:154  Possible Reasons for Event: Inadequate meal intake  Comments/MD notified:Dr Chand updated will monitor    Edwin Hall

## 2023-05-24 NOTE — Progress Notes (Signed)
Patient was transferred to 6N28 via wheelchair . I have updated hids sister Marval Regal

## 2023-05-25 ENCOUNTER — Inpatient Hospital Stay (HOSPITAL_COMMUNITY): Payer: Medicare (Managed Care)

## 2023-05-25 DIAGNOSIS — G43901 Migraine, unspecified, not intractable, with status migrainosus: Secondary | ICD-10-CM | POA: Diagnosis not present

## 2023-05-25 LAB — GLUCOSE, CAPILLARY
Glucose-Capillary: 100 mg/dL — ABNORMAL HIGH (ref 70–99)
Glucose-Capillary: 93 mg/dL (ref 70–99)
Glucose-Capillary: 80 mg/dL (ref 70–99)
Glucose-Capillary: 119 mg/dL — ABNORMAL HIGH (ref 70–99)
Glucose-Capillary: 121 mg/dL — ABNORMAL HIGH (ref 70–99)

## 2023-05-25 LAB — CBC
HCT: 42.8 % (ref 39.0–52.0)
Hemoglobin: 14.4 g/dL (ref 13.0–17.0)
MCH: 31.3 pg (ref 26.0–34.0)
MCHC: 33.6 g/dL (ref 30.0–36.0)
MCV: 93 fL (ref 80.0–100.0)
Platelets: 150 10*3/uL (ref 150–400)
RBC: 4.6 MIL/uL (ref 4.22–5.81)
RDW: 13.6 % (ref 11.5–15.5)
WBC: 9 10*3/uL (ref 4.0–10.5)
nRBC: 0 % (ref 0.0–0.2)

## 2023-05-25 LAB — BASIC METABOLIC PANEL
Anion gap: 6 (ref 5–15)
BUN: 12 mg/dL (ref 6–20)
CO2: 23 mmol/L (ref 22–32)
Calcium: 8.3 mg/dL — ABNORMAL LOW (ref 8.9–10.3)
Chloride: 103 mmol/L (ref 98–111)
Creatinine, Ser: 0.64 mg/dL (ref 0.61–1.24)
GFR, Estimated: >60 mL/min (ref >60)
Glucose, Bld: 93 mg/dL (ref 70–99)
Potassium: 4.1 mmol/L (ref 3.5–5.1)
Sodium: 132 mmol/L — ABNORMAL LOW (ref 135–145)

## 2023-05-25 LAB — MAGNESIUM: Magnesium: 1.8 mg/dL (ref 1.7–2.4)

## 2023-05-25 LAB — PHOSPHORUS: Phosphorus: 3.4 mg/dL (ref 2.5–4.6)

## 2023-05-25 MED ORDER — PROMETHAZINE HCL 25 MG PO TABS: 25.0000 mg | ORAL_TABLET | Freq: Four times a day (QID) | ORAL | 0 refills | Status: AC | PRN

## 2023-05-25 MED ORDER — B COMPLEX-C PO TABS: 1.0000 | ORAL_TABLET | Freq: Every day | ORAL | 0 refills | Status: AC

## 2023-05-25 MED ORDER — COPPER 2 MG PO TABS: 2.0000 mg | Freq: Every day | 0 refills | Status: AC

## 2023-05-25 MED ORDER — MAGNESIUM OXIDE -MG SUPPLEMENT 400 (240 MG) MG PO TABS
400.0000 mg | ORAL_TABLET | Freq: Two times a day (BID) | ORAL | Status: DC
  Administered 2023-05-25: 400 mg via ORAL
  Filled 2023-05-25: qty 1

## 2023-05-25 MED ORDER — MAGNESIUM OXIDE -MG SUPPLEMENT 400 (240 MG) MG PO TABS: 400.0000 mg | ORAL_TABLET | Freq: Two times a day (BID) | ORAL | 0 refills | Status: AC

## 2023-05-25 MED ORDER — SODIUM CHLORIDE 0.9% FLUSH
10.0000 mL | Freq: Two times a day (BID) | INTRAVENOUS | Status: DC
  Administered 2023-05-25: 10 mL

## 2023-05-25 MED ORDER — ACETAMINOPHEN 325 MG PO TABS: 650.0000 mg | ORAL_TABLET | Freq: Four times a day (QID) | ORAL | Status: AC | PRN

## 2023-05-25 MED ORDER — THIAMINE HCL 100 MG PO TABS: 100.0000 mg | ORAL_TABLET | Freq: Every day | ORAL | 0 refills | Status: AC

## 2023-05-25 MED ORDER — ACETAMINOPHEN 500 MG PO TABS
1000.0000 mg | ORAL_TABLET | Freq: Once | ORAL | Status: AC
  Administered 2023-05-25: 1000 mg via ORAL
  Filled 2023-05-25: qty 2

## 2023-05-25 MED ORDER — TOPIRAMATE 25 MG PO TABS
25.0000 mg | ORAL_TABLET | Freq: Two times a day (BID) | ORAL | Status: DC
  Administered 2023-05-25: 25 mg via ORAL
  Filled 2023-05-25: qty 1

## 2023-05-25 MED ORDER — HYDROCODONE-ACETAMINOPHEN 5-325 MG PO TABS: 1.0000 | ORAL_TABLET | Freq: Four times a day (QID) | ORAL | 0 refills | Status: AC | PRN

## 2023-05-25 MED ORDER — TOPIRAMATE 25 MG PO TABS: 25.0000 mg | ORAL_TABLET | Freq: Two times a day (BID) | ORAL | 0 refills | Status: AC

## 2023-05-25 MED ORDER — B COMPLEX-C PO TABS
1.0000 | ORAL_TABLET | Freq: Every day | ORAL | Status: DC
  Administered 2023-05-25: 1 via ORAL
  Filled 2023-05-25: qty 1

## 2023-05-25 MED ORDER — FAMOTIDINE 40 MG PO TABS: 40.0000 mg | ORAL_TABLET | Freq: Every day | ORAL | Status: AC

## 2023-05-25 MED ORDER — HEPARIN SOD (PORK) LOCK FLUSH 100 UNIT/ML IV SOLN
500.0000 [IU] | INTRAVENOUS | Status: AC | PRN
  Administered 2023-05-25: 500 [IU]

## 2023-05-25 NOTE — Care Management Important Message (Signed)
Important Message  Patient Details  Name: JACOBEY GURA MRN: [id number] Date of Birth: [birth year]   Medicare Important Message Given:  Yes     Sherilyn Banker 05/25/2023, 11:29 AM

## 2023-05-25 NOTE — Evaluation (Signed)
Physical Therapy Evaluation and Discharge  Patient Details Name: Edwin Hall MRN: [id number] DOB: September 16, 1966 Today's Date: 05/25/2023  History of Present Illness  Patient is a 57 year old male with intractable migraine with nausea and vomiting, abdominal pain.  Clinical Impression  Patient is agreeable to PT evaluation. He reports ongoing headache but mobility does not make the headache worse. He is independent at baseline and lives alone. He reports he has friends who can check on him if needed after this hospital stay.  The patient is independent or modified independent with all activity. Patient ambulated 34ft in the room without difficulty and without assistive device. He reports light sensitivity, so hallway mobility deferred at this time. The patient is likely close to his baseline level of independence. No apparent acute PT needs at this time. Will sign off.       Assistance Recommended at Discharge PRN  If plan is discharge home, recommend the following:  Can travel by private vehicle  Assist for transportation        Equipment Recommendations None recommended by PT  Recommendations for Other Services       Functional Status Assessment Patient has not had a recent decline in their functional status     Precautions / Restrictions Precautions Precautions: Fall Precaution Comments: due to pain medication Restrictions Weight Bearing Restrictions: No      Mobility  Bed Mobility Overal bed mobility: Independent                  Transfers Overall transfer level: Modified independent                      Ambulation/Gait Ambulation/Gait assistance: Modified independent (Device/Increase time) Gait Distance (Feet): 75 Feet Assistive device: None Gait Pattern/deviations: WFL(Within Functional Limits) Gait velocity: decreased     General Gait Details: slow and cautious at times but steady without assistive device. no loss of balance with  ambulation. patient walked in the room only as he reports light sensitivity  Stairs            Wheelchair Mobility     Tilt Bed    Modified Rankin (Stroke Patients Only)       Balance Overall balance assessment: Modified Independent                                           Pertinent Vitals/Pain Pain Assessment Pain Assessment: 0-10 Pain Score: 10-Worst pain ever Pain Location: headache Pain Descriptors / Indicators: Headache Pain Intervention(s): Monitored during session, Limited activity within patient's tolerance    Home Living Family/patient expects to be discharged to:: Private residence Living Arrangements: Alone Available Help at Discharge: Friend(s);Available PRN/intermittently Type of Home: Apartment Home Access: Level entry       Home Layout: One level        Prior Function Prior Level of Function : Independent/Modified Independent                     Hand Dominance        Extremity/Trunk Assessment   Upper Extremity Assessment Upper Extremity Assessment: Overall WFL for tasks assessed    Lower Extremity Assessment Lower Extremity Assessment: Overall WFL for tasks assessed       Communication      Cognition Arousal/Alertness: Awake/alert Behavior During Therapy: WFL for tasks assessed/performed Overall Cognitive Status: Within  Functional Limits for tasks assessed                                          General Comments General comments (skin integrity, edema, etc.): encouraged routine walking to maintain strength with staff supervision    Exercises     Assessment/Plan    PT Assessment Patient does not need any further PT services  PT Problem List         PT Treatment Interventions      PT Goals (Current goals can be found in the Care Plan section)  Acute Rehab PT Goals PT Goal Formulation: All assessment and education complete, DC therapy    Frequency        Co-evaluation               AM-PAC PT "6 Clicks" Mobility  Outcome Measure Help needed turning from your back to your side while in a flat bed without using bedrails?: None Help needed moving from lying on your back to sitting on the side of a flat bed without using bedrails?: None Help needed moving to and from a bed to a chair (including a wheelchair)?: None Help needed standing up from a chair using your arms (e.g., wheelchair or bedside chair)?: None Help needed to walk in hospital room?: None Help needed climbing 3-5 steps with a railing? : None 6 Click Score: 24    End of Session   Activity Tolerance: Patient tolerated treatment well Patient left: in bed;with call bell/phone within reach;with bed alarm set        Time: 2130-8657 PT Time Calculation (min) (ACUTE ONLY): 8 min   Charges:   PT Evaluation $PT Eval Low Complexity: 1 Low   PT General Charges $$ ACUTE PT VISIT: 1 Visit        Donna Bernard, PT, MPT   Ina Homes 05/25/2023, 1:31 PM

## 2023-05-25 NOTE — Progress Notes (Signed)
Neurology Progress Note   S: Patient is awake and alert, resting in darkened room.  He reports his headache is still pounding and rates it 10 out of 10.  He states that the Robaxin helped his back pain but did not improve his headache.  ------------------- Overview of history  Upon chart review: Has failed triptan, DHE therapy and NSAIDs.  In the past, it seems he has responded well to Decadron, most recently 05/10/2023.  He was seen outpatient this past May, at which time amitriptyline was prescribed at a dose of 50 mg nightly, magnesium 400, riboflavin 400, and caffeine recommended prophylactically. Abortives are Nurtec, Flexeril, and Phenergan.  Previously, Tylenol was ineffective; he avoided NSAIDs secondary to GERD; aspirin was ineffective; fioricet had worsened headaches; Imitrex and Maxalt ineffective; Phenergan helpful; Flexeril helpful taken about once per day; he states prednisone was not helpful but again Decadron noted to be helpful per documentation.  Per chart review, he has had nasal spray and Aimovig ordered, unclear if took these medications.  Upon chart review, it seems that his headaches developed following a hospitalization at [facility] in 11/2021 for partial esophagectomy and status post fundoplication for refractory GERD.  His course was complicated by septic shock requiring 1 month in the ICU with ventilation, tracheostomy, but he has recovered relatively well.  Headaches have been gradually progressive in both severity and frequency and he tends to have daily headaches that are usually debilitating.  Although he denies this currently, they are usually present with nausea, vomiting, light and sound sensitivity as well as a more infrequent visual aura of shining light in peripheral visual fields lasting 10 minutes.  Duration of headache is typically 6-8 hours based on documentation.  O: Current vital signs: BP 126/86 (BP Location: Left Arm)   Pulse 67   Temp 97.8 F (36.6  C) (Oral)   Resp 17   Ht 5\' 8"  (1.727 m)   Wt 55.1 kg   SpO2 97%   BMI 18.47 kg/m  Vital signs in last 24 hours: Temp:  [97.8 F (36.6 C)-98.6 F (37 C)] 97.8 F (36.6 C) (07/10 0356) Pulse Rate:  [65-74] 67 (07/10 0356) Resp:  [16-20] 17 (07/10 0356) BP: (114-126)/(86-90) 126/86 (07/10 0356) SpO2:  [97 %-100 %] 97 % (07/10 0356)  GENERAL: Asleep but arousable HEENT: - Normocephalic and atraumatic, moist mm LUNGS -regular, unlabored respirations on room air Ext: warm, well perfused, no edema   NEURO:  Mental Status: Alert and responsive Language: Speech is clear. AAO to person place, time and situation, able to give some history of his condition Cranial Nerves: PERRL  EOMI, no facial asymmetry, facial sensation intact, hearing intact, tongue/uvula/soft palate midline, normal sternocleidomastoid and trapezius muscle strength. No evidence of tongue atrophy or fasciculations Motor: 5/5 in all 4 extremities Cerebellar: Finger-to-nose intact on both sides Tone: is normal and bulk is normal Sensation- Intact to light touch bilaterally Gait- Deferred  Medications  Current Facility-Administered Medications:    acetaminophen (TYLENOL) tablet 650 mg, 650 mg, Oral, Q6H PRN, 650 mg at 05/23/23 1558 **OR** acetaminophen (TYLENOL) suppository 650 mg, 650 mg, Rectal, Q6H PRN, Doutova, Anastassia, MD   acetaminophen (TYLENOL) tablet 1,000 mg, 1,000 mg, Oral, Once, Rolly Salter, MD   atorvastatin (LIPITOR) tablet 40 mg, 40 mg, Oral, Daily, Sood, Vineet, MD, 40 mg at 05/25/23 0830   B-complex with vitamin C tablet 1 tablet, 1 tablet, Oral, Daily, Rolly Salter, MD   Chlorhexidine Gluconate Cloth 2 % PADS 6  each, 6 each, Topical, Daily, Doutova, Anastassia, MD, 6 each at 05/25/23 0831   copper tablet 2 mg, 2 mg, Oral, Daily, Rai, Ripudeep K, MD, 2 mg at 05/25/23 0830   dextrose 10 % infusion, , Intravenous, Continuous, Paliwal, Aditya, MD, Stopped at 05/24/23 0820   enoxaparin (LOVENOX)  injection 40 mg, 40 mg, Subcutaneous, Daily, Patel, Amar, DO, 40 mg at 05/25/23 0831   famotidine (PEPCID) tablet 40 mg, 40 mg, Oral, BID, Doutova, Anastassia, MD, 40 mg at 05/25/23 0830   feeding supplement (BOOST / RESOURCE BREEZE) liquid 1 Container, 1 Container, Oral, TID BM, Rai, Ripudeep K, MD, 1 Container at 05/25/23 0831   haloperidol lactate (HALDOL) injection 1 mg, 1 mg, Intravenous, Q6H PRN, Modena Slater, DO, 1 mg at 05/21/23 0758   magnesium oxide (MAG-OX) tablet 400 mg, 400 mg, Oral, BID, Rolly Salter, MD   methocarbamol (ROBAXIN) 1,000 mg in dextrose 5 % 100 mL IVPB, 1,000 mg, Intravenous, Q6H, Shafer, Devon, NP, Last Rate: 220 mL/hr at 05/25/23 0609, 1,000 mg at 05/25/23 1610   metoprolol tartrate (LOPRESSOR) tablet 25 mg, 25 mg, Oral, BID, Rai, Ripudeep K, MD, 25 mg at 05/25/23 0830   montelukast (SINGULAIR) tablet 10 mg, 10 mg, Oral, Daily PRN, Doutova, Anastassia, MD, 10 mg at 05/23/23 1558   multivitamin with minerals tablet 1 tablet, 1 tablet, Oral, Daily, Rai, Ripudeep K, MD, 1 tablet at 05/25/23 0831   Oral care mouth rinse, 15 mL, Mouth Rinse, PRN, Doutova, Anastassia, MD   oxyCODONE (Oxy IR/ROXICODONE) immediate release tablet 5 mg, 5 mg, Oral, Q6H PRN, Paliwal, Aditya, MD, 5 mg at 05/25/23 0833   pantoprazole (PROTONIX) EC tablet 40 mg, 40 mg, Oral, BID, Patel, Amar, DO, 40 mg at 05/25/23 0830   polyethylene glycol (MIRALAX / GLYCOLAX) packet 17 g, 17 g, Oral, BID, Paliwal, Aditya, MD, 17 g at 05/25/23 0831   polyvinyl alcohol (LIQUIFILM TEARS) 1.4 % ophthalmic solution 2 drop, 2 drop, Both Eyes, PRN, Jefferson Fuel, MD   promethazine (PHENERGAN) 25 mg in sodium chloride 0.9 % 50 mL IVPB, 25 mg, Intravenous, Q6H PRN, Rai, Ripudeep K, MD, Stopped at 05/22/23 0519   senna-docusate (Senokot-S) tablet 1 tablet, 1 tablet, Oral, BID PRN, Cheri Fowler, MD   sodium chloride flush (NS) 0.9 % injection 10-40 mL, 10-40 mL, Intracatheter, Q12H, Chand, Sudham, MD, 10 mL at 05/25/23  9604  Facility-Administered Medications Ordered in Other Encounters:    sodium chloride flush (NS) 0.9 % injection 10 mL, 10 mL, Intracatheter, PRN, Loni Muse, MD, 10 mL at 09/15/22 1258  Labs CBC    Component Value Date/Time   WBC 9.0 05/25/2023 0427   RBC 4.60 05/25/2023 0427   HGB 14.4 05/25/2023 0427   HCT 42.8 05/25/2023 0427   PLT 150 05/25/2023 0427   MCV 93.0 05/25/2023 0427   MCH 31.3 05/25/2023 0427   MCHC 33.6 05/25/2023 0427   RDW 13.6 05/25/2023 0427   LYMPHSABS 1.5 05/10/2023 0648   MONOABS 0.5 05/10/2023 0648   EOSABS 0.0 05/10/2023 0648   BASOSABS 0.0 05/10/2023 0648    CMP     Component Value Date/Time   NA 132 (L) 05/25/2023 0427   K 4.1 05/25/2023 0427   CL 103 05/25/2023 0427   CO2 23 05/25/2023 0427   GLUCOSE 93 05/25/2023 0427   BUN 12 05/25/2023 0427   CREATININE 0.64 05/25/2023 0427   CREATININE 0.67 09/15/2022 1207   CALCIUM 8.3 (L) 05/25/2023 0427   PROT 5.0 (L)  05/19/2023 0615   ALBUMIN 2.7 (L) 05/19/2023 0615   AST 25 05/19/2023 0615   AST 26 09/15/2022 1207   ALT 38 05/19/2023 0615   ALT 33 09/15/2022 1207   ALKPHOS 53 05/19/2023 0615   BILITOT 0.7 05/19/2023 0615   BILITOT 1.0 09/15/2022 1207   GFRNONAA >60 05/25/2023 0427   GFRNONAA >60 09/15/2022 1207   GFRAA >60 06/26/2020 1737    Lipid Panel  No results found for: "CHOL", "TRIG", "HDL", "CHOLHDL", "VLDL", "LDLCALC", "LDLDIRECT"  No results found for: "HGBA1C"   Imaging I have reviewed images in epic and the results pertinent to this consultation are:  MRI examination of the brain no acute process MRV no dural sinus thrombosis  Assessment:  57 y.o. male who presents with status migrainosus versus exacerbation of another type of chronic daily headache, with intractable tension headache included in the differential. He has suffered from chronic daily headaches with gradually progressive course beginning sometime after his admission to [facility] in 11/2021 for esophagectomy  for refractory GERD, with a course complicated by septic shock, mechanical ventilation, and tracheostomy-now removed. This is his third recent presentation to the ED and failed multiple headache cocktails.  - He was admitted for DHE but had chest pain after 1st dose and this was d/c'd.  - Subsequently transferred to ICU and Ketamine started 7/4 pm. Headache improved to 8/10, but this is was with Dilaudid on board as well, which is not helpful for longterm control. - He endorses no relief s/p the following: PRN muscle relaxant, phenergan, 4g IV magnesium on 7/7, IV fluids, IV Haldol, toradol, benadryl, aimovig, maxalt, nurtec, fioricet, diclofenac, 4mg  decadron, 1g depakote, and  Dilaudid prn.  -On 7/10, patient endorses no relief with Robaxin.  Ketamine has been weaned off.  Will try Topamax, but at this point have exhausted all headache treatments available inpatient  Recommendations: -Stop Robaxin as this has not produced improvement in headache -Start Topamax 25 mg twice daily -Continue mag ox and riboflavin daily  -No IV dilaudid -Avoid overuse of NSAIDs, APAP, fioricet to avoid medication overuse headaches  -Also dicussed recommendation of more holistic therapies such as scalp and neck massage, relaxation techniques, CBT, etc.  - IVF/Hydration -Okay to discharge patient as no further in-hospital therapies for headache available -Will need outpatient Neurology follow up -Neurohospitalist service will sign off at this time   Seen by NP and then by MD, MD to edit note is needed Cortney E Ernestina Columbia , MSN, AGACNP-BC [facility] See Amion for schedule and pager information 05/25/2023 11:23 AM  Electronically signed: Dr. Caryl Pina

## 2023-05-25 NOTE — Progress Notes (Signed)
Phenergan requested from pharmacy

## 2023-05-25 NOTE — Discharge Summary (Signed)
Physician Discharge Summary   Patient: Edwin Hall MRN: [id number] DOB: [birth year]  Admit date:     05/18/2023  Discharge date: 05/25/23  Discharge Physician: Lynden Oxford  PCP: Leane Call, PA-C  Recommendations at discharge: Follow-up with neurology outpatient. Follow-up with PCP in 1 week.   Follow-up Information     Nodal, Joline Salt, PA-C. Schedule an appointment as soon as possible for a visit in 1 week(s).   Specialty: Physician Assistant Contact information: [address] [place] [address] [phone]         Ward, Lamonte Richer, MD. Schedule an appointment as soon as possible for a visit in 1 week(s).   Specialty: Neurology Contact information: [address] [place] [address] [phone]                Discharge Diagnoses: Principal Problem:   Status migrainosus Active Problems:   Moderate protein-calorie malnutrition (HCC)   Dyslipidemia   Intractable nausea and vomiting   Chest pain   Intractable chronic migraine with aura with status migrainosus  Hospital Course: 4 yoM with PMH of IBS, HLD, asthma, headache, barretts esophagus s/p partial esophagectomy c/b malabsorption syndrome who was admitted to [facility] 7/3 with 12 days of persistent migraine with bifrontal throbbing pain with blurred vision, nausea and vomiting unresponsive to multiple medications, two ER visits and referred back to ER by neurologist, followed by [facility]. This admit, Neurology consulted, but continued pain despite decadron, dilaudid, ativan, compazine, valproic acid and phenergan. MRI brain and MR venogram negative. DHE (dihydroergotamine) attempted but patient developed severe chest pain and hypertension. Neurology consulting PCCM for transfer to ICU to start ketamine infusion for pain control. 07/03 admitted  07/04 Transferred to ICU for Ketamine infusion  07/07 start dilaudid PCA and d/c ketamine 7/10 Transferred to hospital  floor.  Neurology recommended "Okay to discharge patient as no further in-hospital therapies for headache available. at this point have exhausted all headache treatments available inpatient "  Assessment and Plan  Intractable migraine with nausea and vomiting. Hx of anxiety. Treated with IV Robaxin, IV ketamine. Also on Phenergan, Haldol and Tylenol. Neurology recommended discontinue Flexeril.  Amitriptyline was not used in the hospital.  Patient was started on Topamax before discharge. On 7/10, Neurology recommended "Okay to discharge patient as no further in-hospital therapies for headache available. at this point have exhausted all headache treatments available inpatient " Discussed this with the patient as well, patient verbalized understanding and agreed for discharge.   Abdominal pain X-ray abdomen unremarkable. Abdominal pain resolved. Patient able to tolerate oral diet without any nausea or vomiting.   Hx of HLD, HTN. No acute concerns.  Blood pressure measuring well.   -Continue Lipitor 40 mg daily   Moderate protein calorie malnutrition. Patient is tolerating diet well. Will continue vitamin supplementation on discharge.   Hx of asthma, urticaria. No concern for respiratory concerns at this moment. -Continue Singulair -Continue as needed albuterol   Hx of Barrett's esophagus. Patient does have some abdominal tenderness today, I do not think this is related. -Continue Protonix twice daily -Continue Pepcid twice daily   Pain control - [place] Controlled Substance Reporting System database was reviewed. and patient was instructed, not to drive, operate heavy machinery, perform activities at heights, swimming or participation in water activities or provide baby-sitting services while on Pain, Sleep and Anxiety Medications; until their outpatient Physician has advised to do so again. Also recommended to not to take  more than prescribed Pain, Sleep and Anxiety  Medications.  Consultants:  Neurology PCCM   Procedures performed:  IV ketamine infusion  DISCHARGE MEDICATION: Allergies as of 05/25/2023       Reactions   Mushroom Extract Complex Anaphylaxis, Hives   Other Anaphylaxis   All seafood    Shellfish Allergy Anaphylaxis   Reglan [metoclopramide] Hives   Severe itching all over   Zofran [ondansetron Hcl] Itching, Rash   Patient states he develops a rash.   Ergotamine Hypertension   Severe chest pain and hypertension to DHE(Dihydroergotamine)   Tetracyclines & Related Itching        Medication List     STOP taking these medications    amitriptyline 25 MG tablet Commonly known as: ELAVIL   cyclobenzaprine 10 MG tablet Commonly known as: FLEXERIL   dihydroergotamine 4 MG/ML nasal spray Commonly known as: MIGRANAL   rosuvastatin 20 MG tablet Commonly known as: CRESTOR       TAKE these medications    acetaminophen 325 MG tablet Commonly known as: TYLENOL Take 2 tablets (650 mg total) by mouth every 6 (six) hours as needed for mild pain, fever or headache (or Fever >/= 101).   Aimovig 140 MG/ML Soaj Generic drug: Erenumab-aooe Inject into the skin.   aspirin EC 81 MG tablet Take 81 mg by mouth at bedtime. Swallow whole.   atorvastatin 40 MG tablet Commonly known as: LIPITOR Take 1 tablet by mouth at bedtime.   B-complex with vitamin C tablet Take 1 tablet by mouth daily. Start taking on: May 26, 2023   copper tablet Take 1 tablet (2 mg total) by mouth daily. Start taking on: May 26, 2023   famotidine 40 MG tablet Commonly known as: PEPCID Take 1 tablet (40 mg total) by mouth daily. What changed: when to take this   HYDROcodone-acetaminophen 5-325 MG tablet Commonly known as: NORCO/VICODIN Take 1 tablet by mouth every 6 (six) hours as needed for moderate pain or severe pain.   Iron 325 (65 Fe) MG Tabs Take 325 mg by mouth every other day.   magnesium oxide 400 (240 Mg) MG tablet Commonly  known as: MAG-OX Take 1 tablet (400 mg total) by mouth 2 (two) times daily.   multivitamin with minerals Tabs tablet Take 1 tablet by mouth daily.   pantoprazole 40 MG tablet Commonly known as: PROTONIX Take 40 mg by mouth 2 (two) times daily.   promethazine 25 MG tablet Commonly known as: PHENERGAN Take 1 tablet (25 mg total) by mouth every 6 (six) hours as needed for nausea or vomiting.   rizatriptan 10 MG tablet Commonly known as: MAXALT Take by mouth.   sucralfate 1 g tablet Commonly known as: CARAFATE Take 1 tablet (1 g total) by mouth 4 (four) times daily -  with meals and at bedtime.   thiamine 100 MG tablet Commonly known as: VITAMIN B1 Take 1 tablet (100 mg total) by mouth daily.   topiramate 25 MG tablet Commonly known as: TOPAMAX Take 1 tablet (25 mg total) by mouth 2 (two) times daily.   traZODone 50 MG tablet Commonly known as: DESYREL Take 50 mg by mouth at bedtime.   VITAMIN B 12 PO Take 1,000 mcg by mouth daily.   Vitamin D3 10 MCG (400 UNIT) Caps Take 400 Units by mouth daily.   Zinc 50 MG Tabs Take 50 mg by mouth daily.       Disposition: Home Diet recommendation: Regular diet  Discharge Exam: Vitals:  05/25/23 0356 05/25/23 0812 05/25/23 1139 05/25/23 1618  BP: 126/86 125/87 109/82 99/63  Pulse: 67 70 73 71  Resp: 17 16 17 17   Temp: 97.8 F (36.6 C) 98.4 F (36.9 C) 98.7 F (37.1 C) 98.3 F (36.8 C)  TempSrc: Oral Oral Oral Oral  SpO2: 97% 100% 100% 97%  Weight:      Height:       General: Appear in mild distress; no visible Abnormal Neck Mass Or lumps, Conjunctiva normal Cardiovascular: S1 and S2 Present, no Murmur, Respiratory: good respiratory effort, Bilateral Air entry present and CTA, no Crackles, no wheezes Abdomen: Bowel Sound present, Non tender  Extremities: no Pedal edema Neurology: alert and oriented to time, place, and person  Filed Weights   05/18/23 1445 05/19/23 1000 05/19/23 2100  Weight: 54.4 kg 56.8 kg  55.1 kg   Condition at discharge: stable  The results of significant diagnostics from this hospitalization (including imaging, microbiology, ancillary and laboratory) are listed below for reference.   Imaging Studies: DG Abd Portable 1V  Result Date: 05/25/2023 CLINICAL DATA:  Abdominal pain EXAM: PORTABLE ABDOMEN - 1 VIEW COMPARISON:  KUB 06/14/2022, CT abdomen 01/03/2023 FINDINGS: There is a nonobstructive bowel gas pattern. There is no definite free intraperitoneal air. Surgical clips are noted in the upper abdomen. There is asymmetric elevation of the left hemidiaphragm, incompletely imaged. The right lung base is clear. There is no acute osseous abnormality. IMPRESSION: 1. No acute finding in the abdomen. 2. Asymmetric elevation of the left hemidiaphragm, incompletely imaged. Electronically Signed   By: Lesia Hausen M.D.   On: 05/25/2023 08:50   MR Brain W and Wo Contrast  Result Date: 05/18/2023 CLINICAL DATA:  Migraines, nausea vomiting EXAM: MRI HEAD WITHOUT AND WITH CONTRAST MR VENOGRAM HEAD WITHOUT AND WITH CONTRAST TECHNIQUE: Multiplanar, multi-echo pulse sequences of the brain and surrounding structures were acquired without and with intravenous contrast. Angiographic images of the intracranial venous structures were acquired using MRV technique without and with intravenous contrast. CONTRAST:  5.77mL GADAVIST GADOBUTROL 1 MMOL/ML IV SOLN COMPARISON:  No prior MRI available, correlation is made with 05/16/2023 CTA head and neck FINDINGS: Evaluation is somewhat limited by motion artifact. MRI HEAD WITHOUT AND WITH CONTRAST Brain: No restricted diffusion to suggest acute or subacute infarct. No abnormal parenchymal or meningeal enhancement. No acute hemorrhage, mass, mass effect, or midline shift. No hydrocephalus or extra-axial collection. Normal pituitary and craniocervical junction. No hemosiderin deposition to suggest remote hemorrhage. Normal cerebral volume for age. 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 Scattered and confluent T2 hyperintense signal in the periventricular white matter, likely the sequela of mild-to-moderate chronic small vessel ischemic disease. Vascular: Normal arterial flow voids. Normal arterial and venous enhancement. Skull and upper cervical spine: Normal marrow signal. Sinuses/Orbits: Clear paranasal sinuses. No acute finding in the orbits. Other: The mastoid air cells are well aerated. MR VENOGRAM HEAD WITHOUT AND WITH CONTRAST There is no evidence of dural venous sinus or deep cerebral vein thrombosis. No dural venous sinus stenosis. IMPRESSION: 1. No acute intracranial process. No evidence of acute or subacute infarct. 2. No evidence of dural venous sinus thrombosis or stenosis. Electronically Signed   By: Wiliam Ke M.D.   On: 05/18/2023 19:46   MR MRV HEAD W WO CONTRAST  Result Date: 05/18/2023 CLINICAL DATA:  Migraines, nausea vomiting EXAM: MRI HEAD WITHOUT AND WITH CONTRAST MR VENOGRAM HEAD WITHOUT AND WITH CONTRAST TECHNIQUE: Multiplanar, multi-echo pulse sequences of the brain and surrounding structures were  acquired without and with intravenous contrast. Angiographic images of the intracranial venous structures were acquired using MRV technique without and with intravenous contrast. CONTRAST:  5.35mL GADAVIST GADOBUTROL 1 MMOL/ML IV SOLN COMPARISON:  No prior MRI available, correlation is made with 05/16/2023 CTA head and neck FINDINGS: Evaluation is somewhat limited by motion artifact. MRI HEAD WITHOUT AND WITH CONTRAST Brain: No restricted diffusion to suggest acute or subacute infarct. No abnormal parenchymal or meningeal enhancement. No acute hemorrhage, mass, mass effect, or midline shift. No hydrocephalus or extra-axial collection. Normal pituitary and craniocervical junction. No hemosiderin deposition to suggest remote hemorrhage. Normal cerebral volume for age. 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 Scattered and confluent T2 hyperintense  signal in the periventricular white matter, likely the sequela of mild-to-moderate chronic small vessel ischemic disease. Vascular: Normal arterial flow voids. Normal arterial and venous enhancement. Skull and upper cervical spine: Normal marrow signal. Sinuses/Orbits: Clear paranasal sinuses. No acute finding in the orbits. Other: The mastoid air cells are well aerated. MR VENOGRAM HEAD WITHOUT AND WITH CONTRAST There is no evidence of dural venous sinus or deep cerebral vein thrombosis. No dural venous sinus stenosis. IMPRESSION: 1. No acute intracranial process. No evidence of acute or subacute infarct. 2. No evidence of dural venous sinus thrombosis or stenosis. Electronically Signed   By: Wiliam Ke M.D.   On: 05/18/2023 19:46   CT ANGIO HEAD NECK W WO CM  Result Date: 05/16/2023 CLINICAL DATA:  Headache EXAM: CT ANGIOGRAPHY HEAD AND NECK WITH AND WITHOUT CONTRAST TECHNIQUE: Multidetector CT imaging of the head and neck was performed using the standard protocol during bolus administration of intravenous contrast. Multiplanar CT image reconstructions and MIPs were obtained to evaluate the vascular anatomy. Carotid stenosis measurements (when applicable) are obtained utilizing NASCET criteria, using the distal internal carotid diameter as the denominator. RADIATION DOSE REDUCTION: This exam was performed according to the departmental dose-optimization program which includes automated exposure control, adjustment of the mA and/or kV according to patient size and/or use of iterative reconstruction technique. CONTRAST:  75mL OMNIPAQUE IOHEXOL 350 MG/ML SOLN COMPARISON:  No prior CTA available, correlation is made with 05/10/2023 CT head FINDINGS: CT HEAD FINDINGS Brain: No evidence of acute infarct, hemorrhage, mass, mass effect, or midline shift. No hydrocephalus or extra-axial fluid collection. Redemonstrated hyperdense in the right greater than left corona radiata and basal ganglia, unchanged. Vascular: No  hyperdense vessel. Skull: Negative for fracture or focal lesion. Sinuses/Orbits: No acute finding. Other: The mastoid air cells are well aerated. CTA NECK FINDINGS Aortic arch: Standard branching. Imaged portion shows no evidence of aneurysm or dissection. No hemodynamically significant stenosis of the major arch vessel origins. 40% stenosis in the proximal left subclavian artery (series 14, image 175 and 179), secondary to primarily noncalcified plaque. Right carotid system: No evidence of dissection, occlusion, or hemodynamically significant stenosis (greater than 50%). Left carotid system: No evidence of dissection, occlusion, or hemodynamically significant stenosis (greater than 50%). Vertebral arteries: No evidence of dissection, occlusion, or hemodynamically significant stenosis (greater than 50%). Skeleton: No acute osseous abnormality. Degenerative changes in the cervical spine. Other neck: Patulous proximal esophagus. Upper chest: No focal pulmonary opacity or pleural effusion. Left upper lobe scarring and/or atelectasis appears unchanged from 11/26/2022. Review of the MIP images confirms the above findings CTA HEAD FINDINGS Anterior circulation: Both internal carotid arteries are patent to the termini, without significant stenosis. A1 segments patent. Normal anterior communicating artery. Anterior cerebral arteries are patent to their distal aspects without significant stenosis.  No M1 stenosis or occlusion. MCA branches perfused to their distal aspects without significant stenosis. Posterior circulation: Vertebral arteries patent to the vertebrobasilar junction without significant stenosis. Posterior inferior cerebellar arteries patent proximally. Basilar patent to its distal aspect without significant stenosis. Superior cerebellar arteries patent proximally. Patent P1 segments. PCAs perfused to their distal aspects without significant stenosis. The bilateral posterior communicating arteries are patent.  Venous sinuses: As permitted by contrast timing, patent. Anatomic variants: None significant. Review of the MIP images confirms the above findings IMPRESSION: 1. No acute intracranial process. 2. No intracranial large vessel occlusion or significant stenosis. 3. No hemodynamically significant stenosis in the neck. 4. 40% stenosis in the proximal left subclavian artery. Electronically Signed   By: Wiliam Ke M.D.   On: 05/16/2023 03:46   CT Head Wo Contrast  Result Date: 05/10/2023 CLINICAL DATA:  Migraine for 4 days with photosensitivity. EXAM: CT HEAD WITHOUT CONTRAST TECHNIQUE: Contiguous axial images were obtained from the base of the skull through the vertex without intravenous contrast. RADIATION DOSE REDUCTION: This exam was performed according to the departmental dose-optimization program which includes automated exposure control, adjustment of the mA and/or kV according to patient size and/or use of iterative reconstruction technique. COMPARISON:  CT head 03/10/2021 FINDINGS: Brain: There is no acute intracranial hemorrhage, extra-axial fluid collection, or acute infarct. Parenchymal volume is normal. The ventricles are normal in size. Gray-white differentiation is preserved. There is patchy hypodensity in the bilateral corona radiata and internal capsules which appears progressed from the prior head CT. The pituitary and suprasellar region are normal. There is no mass lesion. There is no mass effect or midline shift. Vascular: No hyperdense vessel or unexpected calcification. Skull: Normal. Negative for fracture or focal lesion. Sinuses/Orbits: The paranasal sinuses are clear. The globes and orbits are unremarkable. Other: The mastoid air cells and middle ear cavities are clear. IMPRESSION: 1. Patchy hypodensity in the corona radiata and internal capsules appears progressed since the prior head CT from 2022. This finding is nonspecific and may reflect age accelerated chronic small-vessel ischemic  change, with differential including demyelinating disease or other prior insult. Brain MRI (preferably with contrast) may be considered as indicated. 2. Otherwise, no acute intracranial pathology. Electronically Signed   By: Lesia Hausen M.D.   On: 05/10/2023 08:52    Microbiology: Results for orders placed or performed during the hospital encounter of 05/18/23  SARS Coronavirus 2 by RT PCR (hospital order, performed in [facility] hospital lab) *cepheid single result test* Anterior Nasal Swab     Status: None   Collection Time: 05/18/23 10:52 PM   Specimen: Anterior Nasal Swab  Result Value Ref Range Status   SARS Coronavirus 2 by RT PCR NEGATIVE NEGATIVE Final    Comment: (NOTE) SARS-CoV-2 target nucleic acids are NOT DETECTED.  The SARS-CoV-2 RNA is generally detectable in upper and lower respiratory specimens during the acute phase of infection. The lowest concentration of SARS-CoV-2 viral copies this assay can detect is 250 copies / mL. A negative result does not preclude SARS-CoV-2 infection and should not be used as the sole basis for treatment or other patient management decisions.  A negative result may occur with improper specimen collection / handling, submission of specimen other than nasopharyngeal swab, presence of viral mutation(s) within the areas targeted by this assay, and inadequate number of viral copies (<250 copies / mL). A negative result must be combined with clinical observations, patient history, and epidemiological information.  Fact Sheet for Patients:  RoadLapTop.co.za  Fact Sheet for Healthcare Providers: http://kim-miller.com/  This test is not yet approved or  cleared by the [place] FDA and has been authorized for detection and/or diagnosis of SARS-CoV-2 by FDA under an Emergency Use Authorization (EUA).  This EUA will remain in effect (meaning this test can be used) for the duration of the COVID-19  declaration under Section 564(b)(1) of the Act, 21 U.S.C. section 360bbb-3(b)(1), unless the authorization is terminated or revoked sooner.  Performed at [facility], [address] [place], [place] [address]   MRSA Next Gen by PCR, Nasal     Status: None   Collection Time: 05/19/23  8:23 PM   Specimen: Nasal Mucosa; Nasal Swab  Result Value Ref Range Status   MRSA by PCR Next Gen NOT DETECTED NOT DETECTED Final    Comment: (NOTE) The GeneXpert MRSA Assay (FDA approved for NASAL specimens only), is one component of a comprehensive MRSA colonization surveillance program. It is not intended to diagnose MRSA infection nor to guide or monitor treatment for MRSA infections. Test performance is not FDA approved in patients less than 67 years old. Performed at [facility], [address], [place], [place] [address]    Labs: CBC: Recent Labs  Lab 05/19/23 0615 05/20/23 0500 05/21/23 0539 05/23/23 0800 05/25/23 0427  WBC 6.9 6.4 8.9 8.4 9.0  HGB 13.9 14.0 14.1 13.5 14.4  HCT 40.1 42.4 43.0 39.4 42.8  MCV 92.6 94.2 95.6 91.0 93.0  PLT 151 144* 154 143* 150   Basic Metabolic Panel: Recent Labs  Lab 05/19/23 0615 05/19/23 0616 05/21/23 0539 05/22/23 0346 05/23/23 0800 05/24/23 0302 05/25/23 0427  NA 134*   < > 136 136 127* 135 132*  K 4.2   < > 4.2 4.4 3.6 4.0 4.1  CL 102   < > 106 99 98 103 103  CO2 24   < > 23 27 24 22 23   GLUCOSE 107*   < > 88 110* 131* 102* 93  BUN 13   < > 20 16 8 8 12   CREATININE 0.66   < > 0.56* 0.59* 0.40* 0.51* 0.64  CALCIUM 8.1*   < > 7.7* 8.1* 7.9* 8.2* 8.3*  MG 1.6*   < > 1.9 1.8 1.6* 1.9 1.8  PHOS 3.8  --   --   --  2.6 2.7 3.4   < > = values in this interval not displayed.   Liver Function Tests: Recent Labs  Lab 05/18/23 2213 05/19/23 0615  AST 24 25  ALT 37 38  ALKPHOS 49 53  BILITOT 0.8 0.7  PROT 5.0* 5.0*  ALBUMIN 2.8* 2.7*   CBG: Recent Labs  Lab 05/25/23 0132 05/25/23 0354 05/25/23 0810  05/25/23 1140 05/25/23 1631  GLUCAP 100* 93 80 119* 121*    Discharge time spent: greater than 30 minutes.  Author: Lynden Oxford, MD  [facility]

## 2023-05-25 NOTE — Progress Notes (Signed)
Discharge instructions given to pt. Pt verbalized understanding of all teaching and had no further questions. Pt verbalized understanding of following up with PCP for any other needs after discharge. Printed prescription for copper given to pt and placed with discharge paperwork. Pt currently in room waiting for IV team to come and deaccess port and for ride to pick him up.

## 2023-05-31 ENCOUNTER — Encounter (HOSPITAL_COMMUNITY): Payer: Self-pay

## 2023-05-31 ENCOUNTER — Emergency Department (HOSPITAL_COMMUNITY)
Admission: EM | Admit: 2023-05-31 | Discharge: 2023-06-01 | Disposition: A | Discharge status: Home / Self Care | Payer: Medicare (Managed Care) | Attending: Emergency Medicine | Admitting: Emergency Medicine

## 2023-05-31 ENCOUNTER — Other Ambulatory Visit: Payer: Self-pay

## 2023-05-31 DIAGNOSIS — R519 Headache, unspecified: Secondary | ICD-10-CM | POA: Diagnosis present

## 2023-05-31 DIAGNOSIS — Z7982 Long term (current) use of aspirin: Secondary | ICD-10-CM | POA: Diagnosis not present

## 2023-05-31 LAB — BASIC METABOLIC PANEL
Anion gap: 9 (ref 5–15)
BUN: 22 mg/dL — ABNORMAL HIGH (ref 6–20)
CO2: 25 mmol/L (ref 22–32)
Calcium: 8.5 mg/dL — ABNORMAL LOW (ref 8.9–10.3)
Chloride: 101 mmol/L (ref 98–111)
Creatinine, Ser: 0.66 mg/dL (ref 0.61–1.24)
GFR, Estimated: >60 mL/min (ref >60)
Glucose, Bld: 101 mg/dL — ABNORMAL HIGH (ref 70–99)
Potassium: 3.5 mmol/L (ref 3.5–5.1)
Sodium: 135 mmol/L (ref 135–145)

## 2023-05-31 LAB — CBC
HCT: 43.7 % (ref 39.0–52.0)
Hemoglobin: 14.6 g/dL (ref 13.0–17.0)
MCH: 31.1 pg (ref 26.0–34.0)
MCHC: 33.4 g/dL (ref 30.0–36.0)
MCV: 93.2 fL (ref 80.0–100.0)
Platelets: 241 10*3/uL (ref 150–400)
RBC: 4.69 MIL/uL (ref 4.22–5.81)
RDW: 13.1 % (ref 11.5–15.5)
WBC: 7.6 10*3/uL (ref 4.0–10.5)
nRBC: 0 % (ref 0.0–0.2)

## 2023-05-31 MED ORDER — KETOROLAC TROMETHAMINE 15 MG/ML IJ SOLN
15.0000 mg | Freq: Once | INTRAMUSCULAR | Status: AC
  Administered 2023-05-31: 15 mg via INTRAVENOUS
  Filled 2023-05-31: qty 1

## 2023-05-31 MED ORDER — DEXAMETHASONE SODIUM PHOSPHATE 10 MG/ML IJ SOLN
6.0000 mg | Freq: Once | INTRAMUSCULAR | Status: AC
  Administered 2023-05-31: 6 mg via INTRAVENOUS
  Filled 2023-05-31: qty 1

## 2023-05-31 MED ORDER — HYDROMORPHONE HCL 1 MG/ML IJ SOLN
1.0000 mg | Freq: Once | INTRAMUSCULAR | Status: AC
  Administered 2023-05-31: 1 mg via INTRAVENOUS
  Filled 2023-05-31: qty 1

## 2023-05-31 MED ORDER — SODIUM CHLORIDE 0.9 % IV BOLUS
1000.0000 mL | Freq: Once | INTRAVENOUS | Status: AC
  Administered 2023-05-31: 1000 mL via INTRAVENOUS

## 2023-05-31 MED ORDER — SODIUM CHLORIDE 0.9 % IV SOLN
12.5000 mg | Freq: Four times a day (QID) | INTRAVENOUS | Status: DC | PRN
  Administered 2023-05-31: 12.5 mg via INTRAVENOUS
  Filled 2023-05-31: qty 12.5

## 2023-05-31 NOTE — ED Triage Notes (Signed)
Pt reports ongoing migraine headache for the last several days. Pt reports he has had chronic migraines over the last few months and recently had an admission for same. Pt has followed with neurology and unable to get relief from migraines. Pt without slurred speech or focal weakness.

## 2023-05-31 NOTE — ED Provider Notes (Incomplete)
[place] EMERGENCY DEPARTMENT AT [facility] Provider Note   CSN: [id number] Arrival date & time: 05/31/23  1902     History {Add pertinent medical, surgical, social history, OB history to HPI:1} Chief Complaint  Patient presents with   Migraine    Edwin Hall is a 57 y.o. male presented to the emergency department with complaint of acute on chronic headache.  The patient suffers from severe chronic and near daily migraines.  He sees a neurology specialist for this at [facility].  He has been hospitalized here in our facility as recently as last week, with extensive medications given for migraines, including ketamine infusion in the ICU.  The patient reports that the only medication which ever helped him was to ketamine.  Although at that time, it was documented that he had no improvement of his headache, when I questioned him on this he reports "my headache came back that morning but then was gone for a week."    Today he called his neurology office reporting concern for an even worse headache, "clapping in my ears," and was told to come to the ED.  Patient had MRI and MRV on 05/18/23 with no acute findings.  CTA in July with no evidence of vascular dissection or aneurysm.  Patient reportedly did have another MRI scheduled as an outpatient in 3 days through his neurologist office, but the patient reports to me that the neurologist office is cancelling this scan, as they were not aware patient had MR imaging completed earlier this month at [facility].   HPI     Home Medications Prior to Admission medications   Medication Sig Start Date End Date Taking? Authorizing Provider  acetaminophen (TYLENOL) 325 MG tablet Take 2 tablets (650 mg total) by mouth every 6 (six) hours as needed for mild pain, fever or headache (or Fever >/= 101). 05/25/23   Rolly Salter, MD  aspirin 81 MG EC tablet Take 81 mg by mouth at bedtime. Swallow whole.    [provider]  atorvastatin  (LIPITOR) 40 MG tablet Take 1 tablet by mouth at bedtime. 04/22/23   [provider]  B Complex-C (B-COMPLEX WITH VITAMIN C) tablet Take 1 tablet by mouth daily. 05/26/23   Rolly Salter, MD  Cholecalciferol (VITAMIN D3) 10 MCG (400 UNIT) CAPS Take 400 Units by mouth daily.    [provider]  copper tablet Take 1 tablet (2 mg total) by mouth daily. 05/26/23   Rolly Salter, MD  Cyanocobalamin (VITAMIN B 12 PO) Take 1,000 mcg by mouth daily.    [provider]  Erenumab-aooe (AIMOVIG) 140 MG/ML SOAJ Inject into the skin. 04/29/23   [provider]  famotidine (PEPCID) 40 MG tablet Take 1 tablet (40 mg total) by mouth daily. 05/25/23   Rolly Salter, MD  Ferrous Sulfate (IRON) 325 (65 Fe) MG TABS Take 325 mg by mouth every other day.  05/12/20   [provider]  HYDROcodone-acetaminophen (NORCO/VICODIN) 5-325 MG tablet Take 1 tablet by mouth every 6 (six) hours as needed for moderate pain or severe pain. 05/25/23 05/24/24  Rolly Salter, MD  magnesium oxide (MAG-OX) 400 (240 Mg) MG tablet Take 1 tablet (400 mg total) by mouth 2 (two) times daily. 05/25/23   Rolly Salter, MD  Multiple Vitamin (MULTIVITAMIN WITH MINERALS) TABS tablet Take 1 tablet by mouth daily.    [provider]  pantoprazole (PROTONIX) 40 MG tablet Take 40 mg by mouth 2 (  two) times daily.    [provider]  promethazine (PHENERGAN) 25 MG tablet Take 1 tablet (25 mg total) by mouth every 6 (six) hours as needed for nausea or vomiting. 05/25/23   Rolly Salter, MD  rizatriptan (MAXALT) 10 MG tablet Take by mouth. 03/24/23   [provider]  sucralfate (CARAFATE) 1 g tablet Take 1 tablet (1 g total) by mouth 4 (four) times daily -  with meals and at bedtime. 10/17/20   Tilden Fossa, MD  thiamine (VITAMIN B1) 100 MG tablet Take 1 tablet (100 mg total) by mouth daily. 05/25/23   Rolly Salter, MD  topiramate (TOPAMAX) 25 MG tablet Take 1 tablet (25 mg total) by  mouth 2 (two) times daily. 05/25/23   Rolly Salter, MD  traZODone (DESYREL) 50 MG tablet Take 50 mg by mouth at bedtime.    [provider]  Zinc 50 MG TABS Take 50 mg by mouth daily.     [provider]  azelastine (ASTELIN) 0.1 % nasal spray 2 sprays per nostril twice a day for control of nasal drainage. Patient not taking: Reported on 05/02/2020 11/27/19 05/02/20  Marcelyn Bruins, MD      Allergies    Mushroom extract complex, Other, Shellfish allergy, Reglan [metoclopramide], Zofran [ondansetron hcl], Ergotamine, and Tetracyclines & related    Review of Systems   Review of Systems  Physical Exam Updated Vital Signs BP (!) 129/100   Pulse (!) 113   Temp 98 F (36.7 C) (Oral)   Resp 18   SpO2 99%  Physical Exam Constitutional:      General: He is not in acute distress. HENT:     Head: Normocephalic and atraumatic.  Eyes:     Conjunctiva/sclera: Conjunctivae normal.     Pupils: Pupils are equal, round, and reactive to light.  Cardiovascular:     Rate and Rhythm: Normal rate and regular rhythm.  Pulmonary:     Effort: Pulmonary effort is normal. No respiratory distress.  Abdominal:     General: There is no distension.     Tenderness: There is no abdominal tenderness.  Skin:    General: Skin is warm and dry.  Neurological:     General: No focal deficit present.     Mental Status: He is alert and oriented to person, place, and time. Mental status is at baseline.     Sensory: No sensory deficit.     Motor: No weakness.  Psychiatric:        Mood and Affect: Mood normal.        Behavior: Behavior normal.     ED Results / Procedures / Treatments   Labs (all labs ordered are listed, but only abnormal results are displayed) Labs Reviewed  BASIC METABOLIC PANEL  CBC    EKG EKG Interpretation Date/Time:  Tuesday May 31 2023 19:37:00 EDT Ventricular Rate:  113 PR Interval:  147 QRS Duration:  96 QT Interval:  329 QTC Calculation: 452 R  Axis:   -25  Text Interpretation: Sinus tachycardia Right atrial enlargement Confirmed by Alvester Chou [phone]) on 05/31/2023 9:51:41 PM  Radiology No results found.  Procedures Procedures  {Document cardiac monitor, telemetry assessment procedure when appropriate:1}  Medications Ordered in ED Medications  promethazine (PHENERGAN) 12.5 mg in sodium chloride 0.9 % 50 mL IVPB (has no administration in time range)  ketorolac (TORADOL) 15 MG/ML injection 15 mg (has no administration in time range)  dexamethasone (DECADRON) injection 6 mg (has no  administration in time range)  sodium chloride 0.9 % bolus 1,000 mL (has no administration in time range)    ED Course/ Medical Decision Making/ A&P   {   Click here for ABCD2, HEART and other calculatorsREFRESH Note before signing :1}                          Medical Decision Making Amount and/or Complexity of Data Reviewed Labs: ordered.  Risk Prescription drug management.   This patient presents to the Emergency Department with complaint of headache.  This involves an extensive number of treatment options, and is a complaint that carries with it a high risk of complications and morbidity.  The differential diagnosis for headache includes tension type headache vs occipital headache vs migraine vs sinusitis vs intracranial bleed vs other  However given the patient's history of extensive migraine headaches, I strongly suspect this is again a migraine headache.  I denies indication for repeat or emergent neuroimaging.  Low suspicion for [facility] or meningitis or demyelinating disease. Low suspicion for intracranial hypertension.    I ordered, reviewed, and interpreted labs, including BMP and CBC.  There were no immediate, life-threatening emergencies found in this labwork.    I ordered medication for headache and/or nausea  Previous records obtained and reviewed showing hospitalization records from earlier this month for status migrainosis  admission.  It appears the patient was given every available and possible medication in the hospital, had adverse reaction to DHE, and reported no improvement after any of these medications, including ketamine, and was ultimately discharged with recommendation for homeopathic therapy and PCP follow up.  Based on this fact, unfortunately, I do not suspect that we will likely achieve adequate relief of the patient's symptoms.  We'll attempt pain control in the ED, and I would advise the patient discuss his symptoms with his neurology office tomorrow.  They may be able to arrange for outpatient advanced migraine therapy (botox, ketamine, etc) to avoid recurring hospitalizations.  I personally reviewed the patients ECG  which showed sinus rhythm with no acute ischemic findings  *  After the interventions stated above, I reevaluated the patient and found that the patient remained clinically stable.  Based on the patient's clinical exam, vital signs, risk factors, and ED testing, I felt that the patient's overall risk of life-threatening emergency such as ICH, meningitis, intracranial mass or tumor was quite low.  I suspect this clinical presentation is most consistent with ***, but explained to the patient that this evaluation was not a definitive diagnostic workup.  I discussed outpatient follow up with primary care provider, and provided specialist office number on the patient's discharge paper if a referral was deemed necessary.  I discussed return precautions with the patient. I felt the patient was clinically stable for discharge.   {Document critical care time when appropriate:1} {Document review of labs and clinical decision tools ie heart score, Chads2Vasc2 etc:1}  {Document your independent review of radiology images, and any outside records:1} {Document your discussion with family members, caretakers, and with consultants:1} {Document social determinants of health affecting pt's  care:1} {Document your decision making why or why not admission, treatments were needed:1} Final Clinical Impression(s) / ED Diagnoses Final diagnoses:  None    Rx / DC Orders ED Discharge Orders     None

## 2023-06-01 MED ORDER — DIPHENHYDRAMINE HCL 50 MG/ML IJ SOLN
12.5000 mg | Freq: Once | INTRAMUSCULAR | Status: AC
  Administered 2023-06-01: 12.5 mg via INTRAVENOUS
  Filled 2023-06-01: qty 1

## 2023-06-01 MED ORDER — HEPARIN SOD (PORK) LOCK FLUSH 100 UNIT/ML IV SOLN
500.0000 [IU] | Freq: Once | INTRAVENOUS | Status: AC
  Administered 2023-06-01: 500 [IU]
  Filled 2023-06-01: qty 5

## 2023-06-01 MED ORDER — MAGNESIUM SULFATE 2 GM/50ML IV SOLN
2.0000 g | Freq: Once | INTRAVENOUS | Status: AC
  Administered 2023-06-01: 2 g via INTRAVENOUS
  Filled 2023-06-01: qty 50

## 2023-06-01 NOTE — ED Notes (Signed)
Pt very upset with plan of care to d/c to home and follow up with outpatient provider. Pt informed that all methods to attempt to alleviate headache were provided tonight, however, pt states "well last week they sent me over for ketamine infusion to [facility]." Pt informed that per the provider and neurology here that is not in the plan of care for him this visit. Pt states he will be calling tomorrow to talk about his poor treatment with "whoever is in charge." [employer] Nurse notified of pt complaint, but that pt okay and proceeds with DC to home at this time.

## 2023-06-01 NOTE — ED Notes (Signed)
Pt is discharged. 

## 2023-06-09 ENCOUNTER — Other Ambulatory Visit: Payer: Self-pay

## 2023-06-09 ENCOUNTER — Encounter (HOSPITAL_COMMUNITY): Payer: Self-pay

## 2023-06-09 ENCOUNTER — Emergency Department (HOSPITAL_COMMUNITY)
Admission: EM | Admit: 2023-06-09 | Discharge: 2023-06-10 | Disposition: A | Discharge status: Home / Self Care | Payer: Medicare (Managed Care) | Attending: Student | Admitting: Student

## 2023-06-09 DIAGNOSIS — J45909 Unspecified asthma, uncomplicated: Secondary | ICD-10-CM | POA: Insufficient documentation

## 2023-06-09 DIAGNOSIS — R110 Nausea: Secondary | ICD-10-CM | POA: Insufficient documentation

## 2023-06-09 DIAGNOSIS — Z7982 Long term (current) use of aspirin: Secondary | ICD-10-CM | POA: Insufficient documentation

## 2023-06-09 DIAGNOSIS — R519 Headache, unspecified: Secondary | ICD-10-CM | POA: Diagnosis present

## 2023-06-09 DIAGNOSIS — R770 Abnormality of albumin: Secondary | ICD-10-CM | POA: Diagnosis not present

## 2023-06-09 NOTE — ED Triage Notes (Deleted)
Pt BIB [facility] d/t Migraine for 4 days - hx of same - went  Pt BIB [facility] d/t migraine for 4 days - Hx of same.  Pt was admitted at [facility] for same with no relief.  Pt wanted to be transferred her yesterday d/t being relieved with Ketamine drip but was released instead.  EMS gave 30 mg total of Ketamine Iv through his accessed port.  It relieved it but now  coming back.

## 2023-06-10 LAB — CBC WITH DIFFERENTIAL/PLATELET
Abs Immature Granulocytes: 0.13 10*3/uL — ABNORMAL HIGH (ref 0.00–0.07)
Basophils Absolute: 0.1 10*3/uL (ref 0.0–0.1)
Basophils Relative: 1 %
Eosinophils Absolute: 0.1 10*3/uL (ref 0.0–0.5)
Eosinophils Relative: 1 %
HCT: 42.7 % (ref 39.0–52.0)
Hemoglobin: 13.6 g/dL (ref 13.0–17.0)
Immature Granulocytes: 2 %
Lymphocytes Relative: 34 %
Lymphs Abs: 2.5 10*3/uL (ref 0.7–4.0)
MCH: 31 pg (ref 26.0–34.0)
MCHC: 31.9 g/dL (ref 30.0–36.0)
MCV: 97.3 fL (ref 80.0–100.0)
Monocytes Absolute: 0.8 10*3/uL (ref 0.1–1.0)
Monocytes Relative: 10 %
Neutro Abs: 3.9 10*3/uL (ref 1.7–7.7)
Neutrophils Relative %: 52 %
Platelets: 155 10*3/uL (ref 150–400)
RBC: 4.39 MIL/uL (ref 4.22–5.81)
RDW: 13.8 % (ref 11.5–15.5)
WBC: 7.4 10*3/uL (ref 4.0–10.5)
nRBC: 0 % (ref 0.0–0.2)

## 2023-06-10 LAB — COMPREHENSIVE METABOLIC PANEL
ALT: 32 U/L (ref 0–44)
AST: 29 U/L (ref 15–41)
Albumin: 2.9 g/dL — ABNORMAL LOW (ref 3.5–5.0)
Alkaline Phosphatase: 57 U/L (ref 38–126)
Anion gap: 12 (ref 5–15)
BUN: 21 mg/dL — ABNORMAL HIGH (ref 6–20)
CO2: 22 mmol/L (ref 22–32)
Calcium: 8 mg/dL — ABNORMAL LOW (ref 8.9–10.3)
Chloride: 105 mmol/L (ref 98–111)
Creatinine, Ser: 0.8 mg/dL (ref 0.61–1.24)
GFR, Estimated: >60 mL/min (ref >60)
Glucose, Bld: 92 mg/dL (ref 70–99)
Potassium: 3.8 mmol/L (ref 3.5–5.1)
Sodium: 139 mmol/L (ref 135–145)
Total Bilirubin: 0.8 mg/dL (ref 0.3–1.2)
Total Protein: 5.2 g/dL — ABNORMAL LOW (ref 6.5–8.1)

## 2023-06-10 MED ORDER — DROPERIDOL 2.5 MG/ML IJ SOLN
1.2500 mg | Freq: Once | INTRAMUSCULAR | Status: AC
  Administered 2023-06-10: 1.25 mg via INTRAVENOUS
  Filled 2023-06-10: qty 2

## 2023-06-10 MED ORDER — LACTATED RINGERS IV BOLUS
1000.0000 mL | Freq: Once | INTRAVENOUS | Status: AC
  Administered 2023-06-10: 1000 mL via INTRAVENOUS

## 2023-06-10 MED ORDER — KETOROLAC TROMETHAMINE 15 MG/ML IJ SOLN
15.0000 mg | Freq: Once | INTRAMUSCULAR | Status: AC
  Administered 2023-06-10: 15 mg via INTRAMUSCULAR
  Filled 2023-06-10 (×2): qty 1

## 2023-06-10 MED ORDER — DIPHENHYDRAMINE HCL 50 MG/ML IJ SOLN
25.0000 mg | Freq: Once | INTRAMUSCULAR | Status: AC
  Administered 2023-06-10: 25 mg via INTRAVENOUS
  Filled 2023-06-10: qty 1

## 2023-06-10 MED ORDER — LORAZEPAM 2 MG/ML IJ SOLN
1.0000 mg | Freq: Once | INTRAMUSCULAR | Status: AC
  Administered 2023-06-10: 1 mg via INTRAVENOUS
  Filled 2023-06-10: qty 1

## 2023-06-10 MED ORDER — DEXAMETHASONE SODIUM PHOSPHATE 10 MG/ML IJ SOLN
10.0000 mg | Freq: Once | INTRAMUSCULAR | Status: AC
  Administered 2023-06-10: 10 mg via INTRAVENOUS
  Filled 2023-06-10: qty 1

## 2023-06-10 MED ORDER — SODIUM CHLORIDE 0.9 % IV SOLN
12.5000 mg | Freq: Once | INTRAVENOUS | Status: AC
  Administered 2023-06-10: 12.5 mg via INTRAVENOUS
  Filled 2023-06-10: qty 12.5

## 2023-06-10 MED ORDER — HEPARIN SOD (PORK) LOCK FLUSH 100 UNIT/ML IV SOLN
500.0000 [IU] | Freq: Once | INTRAVENOUS | Status: AC
  Administered 2023-06-10: 500 [IU]
  Filled 2023-06-10: qty 5

## 2023-06-10 MED ORDER — MORPHINE SULFATE (PF) 2 MG/ML IV SOLN
2.0000 mg | Freq: Once | INTRAVENOUS | Status: AC
  Administered 2023-06-10: 2 mg via INTRAVENOUS
  Filled 2023-06-10: qty 1

## 2023-06-10 NOTE — ED Provider Notes (Signed)
[place] EMERGENCY DEPARTMENT AT [facility] Provider Note  CSN: [id number] Arrival date & time: 06/09/23 2205  Chief Complaint(s) Migraine  HPI Edwin Hall is a 57 y.o. male with extensive PMH of complicated headaches and migraines, anxiety, asthma, Barrett's esophagus who presents emergency department for evaluation of a headache.  Patient endorsing headache gradually worsening refractory to outpatient therapy.  Has previously been seen on 05/31/2023 for similar complaints and has previously reported that the only thing that helps his migraines is ketamine.  Currently endorses headache but denies numbness, tingling, weakness or other systemic or neurologic symptoms.  Does endorse some mild nausea.  Did receive pain dose ketamine by EMS prior to arrival.   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   Depression 09/22/2022   Bruising 09/15/2022   Malabsorption syndrome 09/15/2022   Chronic arthralgias of knees and hips 09/15/2022   Malnutrition of moderate degree 07/15/2022   Abdominal pain 07/14/2022   Diarrhea 07/14/2022   FTT (failure to thrive) in adult 10/04/2020   Intractable nausea and vomiting 10/04/2020   Intractable vomiting 06/26/2020   Moderate protein-calorie malnutrition (HCC) 06/26/2020   IBS (irritable bowel syndrome) 06/26/2020   Dyslipidemia 06/26/2020   HTN (hypertension) 06/26/2020   Iron deficiency anemia due to dietary causes 06/26/2020    Pneumothorax 05/15/2020   Nausea & vomiting 05/15/2020   Unintentional weight loss 05/15/2020   Home Medication(s) Prior to Admission medications   Medication Sig Start Date End Date Taking? Authorizing Provider  acetaminophen (TYLENOL) 325 MG tablet Take 2 tablets (650 mg total) by mouth every 6 (six) hours as needed for mild pain, fever or headache (or Fever >/= 101). 05/25/23   Rolly Salter, MD  aspirin 81 MG EC tablet Take 81 mg by mouth at bedtime. Swallow whole.    [provider]  atorvastatin (LIPITOR) 40 MG tablet Take 1 tablet by mouth at bedtime. 04/22/23   [provider]  B Complex-C (B-COMPLEX WITH VITAMIN C) tablet Take 1 tablet by mouth daily. 05/26/23   Rolly Salter, MD  Cholecalciferol (VITAMIN D3) 10 MCG (400 UNIT) CAPS Take 400 Units by mouth daily.    [provider]  copper tablet Take 1 tablet (2 mg total) by mouth daily. 05/26/23   Rolly Salter, MD  Cyanocobalamin (VITAMIN B 12 PO) Take 1,000 mcg by mouth daily.    [provider]  Erenumab-aooe (AIMOVIG) 140 MG/ML SOAJ Inject into the skin. 04/29/23   [provider]  famotidine (PEPCID) 40 MG tablet Take 1 tablet (40 mg total) by mouth daily. 05/25/23   Rolly Salter, MD  Ferrous Sulfate (IRON) 325 (65 Fe) MG TABS Take 325 mg by mouth every other day.  05/12/20   [provider]  HYDROcodone-acetaminophen (NORCO/VICODIN) 5-325 MG tablet Take 1 tablet by mouth every 6 (six) hours as needed for moderate pain or severe pain.  05/25/23 05/24/24  Rolly Salter, MD  magnesium oxide (MAG-OX) 400 (240 Mg) MG tablet Take 1 tablet (400 mg total) by mouth 2 (two) times daily. 05/25/23   Rolly Salter, MD  Multiple Vitamin (MULTIVITAMIN WITH MINERALS) TABS tablet Take 1 tablet by mouth daily.    [provider]  Oxymetazoline HCl (QC NASAL SPRAY NA) Place 1 spray into the nose daily as needed (migraines). Pt unable to tell me what the name is but states it is supposed  to help with migraines    [provider]  pantoprazole (PROTONIX) 40 MG tablet Take 40 mg by mouth 2 (two) times daily.    [provider]  promethazine (PHENERGAN) 25 MG tablet Take 1 tablet (25 mg total) by mouth every 6 (six) hours as needed for nausea or vomiting. 05/25/23   Rolly Salter, MD  rizatriptan (MAXALT) 10 MG tablet Take by mouth. 03/24/23   [provider]  sucralfate (CARAFATE) 1 g tablet Take 1 tablet (1 g total) by mouth 4 (four) times daily -  with meals and at bedtime. 10/17/20   Tilden Fossa, MD  thiamine (VITAMIN B1) 100 MG tablet Take 1 tablet (100 mg total) by mouth daily. 05/25/23   Rolly Salter, MD  topiramate (TOPAMAX) 25 MG tablet Take 1 tablet (25 mg total) by mouth 2 (two) times daily. 05/25/23   Rolly Salter, MD  traZODone (DESYREL) 50 MG tablet Take 50 mg by mouth at bedtime.    [provider]  Zinc 50 MG TABS Take 50 mg by mouth daily.     [provider]  azelastine (ASTELIN) 0.1 % nasal spray 2 sprays per nostril twice a day for control of nasal drainage. Patient not taking: Reported on 05/02/2020 11/27/19 05/02/20  Marcelyn Bruins, MD                                                                                                                                    Past Surgical History Past Surgical History:  Procedure Laterality Date   ABDOMINAL SURGERY  12/2015   pylorus plasty   APPENDECTOMY  1984   BIOPSY  05/18/2020   Procedure: BIOPSY;  Surgeon: Kerin Salen, MD;  Location: [facility];  Service: Gastroenterology;;   CHOLECYSTECTOMY  2008   ESOPHAGOGASTRODUODENOSCOPY (EGD) WITH PROPOFOL N/A 05/18/2020   Procedure: ESOPHAGOGASTRODUODENOSCOPY (EGD) WITH PROPOFOL;  Surgeon: Kerin Salen, MD;  Location: [facility];  Service: Gastroenterology;  Laterality: N/A;   GASTROSTOMY TUBE PLACEMENT     HIATAL HERNIA REPAIR  07/2015   PARTIAL ESOPHAGECTOMY  12/10/2021   TALC PLEURODESIS     TONSILLECTOMY   1980   VENTRAL HERNIA REPAIR  2010   Family History Family History  Problem Relation Age of Onset   Parkinson's disease Mother    Angioedema Mother    Dementia Mother    Heart disease Father    Lung  cancer Brother 6       non-smoker   Allergic rhinitis Neg Hx    Asthma Neg Hx    Eczema Neg Hx    Immunodeficiency Neg Hx    Urticaria Neg Hx     Social History Social History   Tobacco Use   Smoking status: Never    Passive exposure: Never   Smokeless tobacco: Never  Vaping Use   Vaping status: Never Used  Substance Use Topics   Alcohol use: No   Drug use: No   Allergies Mushroom extract complex, Other, Shellfish allergy, Reglan [metoclopramide], Zofran [ondansetron hcl], Ergotamine, and Tetracyclines & related  Review of Systems Review of Systems  Gastrointestinal:  Positive for nausea.  Neurological:  Positive for headaches.    Physical Exam Vital Signs  I have reviewed the triage vital signs BP 121/89   Pulse 81   Temp 98.9 F (37.2 C) (Oral)   Resp 18   Ht 5\' 8"  (1.727 m)   Wt 51.7 kg   SpO2 100%   BMI 17.33 kg/m   Physical Exam Constitutional:      General: He is not in acute distress.    Appearance: Normal appearance.  HENT:     Head: Normocephalic and atraumatic.     Nose: No congestion or rhinorrhea.  Eyes:     General:        Right eye: No discharge.        Left eye: No discharge.     Extraocular Movements: Extraocular movements intact.     Pupils: Pupils are equal, round, and reactive to light.  Cardiovascular:     Rate and Rhythm: Normal rate and regular rhythm.     Heart sounds: No murmur heard. Pulmonary:     Effort: No respiratory distress.     Breath sounds: No wheezing or rales.  Abdominal:     General: There is no distension.     Tenderness: There is no abdominal tenderness.  Musculoskeletal:        General: Normal range of motion.     Cervical back: Normal range of motion.  Skin:    General: Skin is warm and dry.   Neurological:     General: No focal deficit present.     Mental Status: He is alert.     Cranial Nerves: No cranial nerve deficit.     Sensory: No sensory deficit.     Motor: No weakness.     ED Results and Treatments Labs (all labs ordered are listed, but only abnormal results are displayed) Labs Reviewed  COMPREHENSIVE METABOLIC PANEL - Abnormal; Notable for the following components:      Result Value   BUN 21 (*)    Calcium 8.0 (*)    Total Protein 5.2 (*)    Albumin 2.9 (*)    All other components within normal limits  CBC WITH DIFFERENTIAL/PLATELET - Abnormal; Notable for the following components:   Abs Immature Granulocytes 0.13 (*)    All other components within normal limits  Radiology No results found.  Pertinent labs & imaging results that were available during my care of the patient were reviewed by me and considered in my medical decision making (see MDM for details).  Medications Ordered in ED Medications  droperidol (INAPSINE) 2.5 MG/ML injection 1.25 mg (1.25 mg Intravenous Given 06/10/23 0234)  LORazepam (ATIVAN) injection 1 mg (1 mg Intravenous Given 06/10/23 0232)  dexamethasone (DECADRON) injection 10 mg (10 mg Intravenous Given 06/10/23 0233)  ketorolac (TORADOL) 15 MG/ML injection 15 mg (15 mg Intramuscular Given 06/10/23 0245)  lactated ringers bolus 1,000 mL (0 mLs Intravenous Stopped 06/10/23 0522)  promethazine (PHENERGAN) 12.5 mg in sodium chloride 0.9 % 50 mL IVPB (0 mg Intravenous Stopped 06/10/23 0522)  diphenhydrAMINE (BENADRYL) injection 25 mg (25 mg Intravenous Given 06/10/23 0408)  morphine (PF) 2 MG/ML injection 2 mg (2 mg Intravenous Given 06/10/23 0408)  heparin lock flush 100 unit/mL (500 Units Intracatheter Given 06/10/23 0546)                                                                                                                                      Procedures Procedures  (including critical care time)  Medical Decision Making / ED Course   This patient presents to the ED for concern of headache, this involves an extensive number of treatment options, and is a complaint that carries with it a high risk of complications and morbidity.  The differential diagnosis includes migraine, Cluster, Tension Ha, Dural venous thrombosis, Sinusitis, CO poisoning, HTN, Malignancy  MDM: Patient seen emerged part for evaluation of a migraine headache.  Physical exam is unremarkable with no focal motor or sensory deficits, no cranial nerve deficits.  Laboratory evaluation largely unremarkable outside of some mild hypoalbuminemia to 2.9.  As today's presentation appears identical to previous refractory migraine presentations, will defer advanced imaging and treat with aggressive headache cocktail.  Patient received droperidol, Ativan, Decadron and on reevaluation, pain has remained a 7 but is still present.  Second round of headache cocktail with Toradol, morphine, Benadryl, Phenergan and fluids.  On second reevaluation he states his pain is significantly improved.  He is requesting to follow-up for second opinion with neurologist in the [facility] and thus I placed an outpatient referral.  With normal neurologic exam, low suspicion for new intracranial life-threatening pathology today and advanced imaging deferred.  Patient been discharged with outpatient follow-up   Additional history obtained:  -External records from outside source obtained and reviewed including: Chart review including previous notes, labs, imaging, consultation notes   Lab Tests: -I ordered, reviewed, and interpreted labs.   The pertinent results include:   Labs Reviewed  COMPREHENSIVE METABOLIC PANEL - Abnormal; Notable for the following components:      Result Value   BUN 21 (*)    Calcium 8.0 (*)    Total Protein 5.2 (*)    Albumin 2.9 (*)     All  other components within normal limits  CBC WITH DIFFERENTIAL/PLATELET - Abnormal; Notable for the following components:   Abs Immature Granulocytes 0.13 (*)    All other components within normal limits      Medicines ordered and prescription drug management: Meds ordered this encounter  Medications   droperidol (INAPSINE) 2.5 MG/ML injection 1.25 mg   LORazepam (ATIVAN) injection 1 mg   dexamethasone (DECADRON) injection 10 mg   ketorolac (TORADOL) 15 MG/ML injection 15 mg   lactated ringers bolus 1,000 mL   promethazine (PHENERGAN) 12.5 mg in sodium chloride 0.9 % 50 mL IVPB   diphenhydrAMINE (BENADRYL) injection 25 mg   morphine (PF) 2 MG/ML injection 2 mg   heparin lock flush 100 unit/mL    -I have reviewed the patients home medicines and have made adjustments as needed  Critical interventions none    Cardiac Monitoring: The patient was maintained on a cardiac monitor.  I personally viewed and interpreted the cardiac monitored which showed an underlying rhythm of: NSR  Social Determinants of Health:  Factors impacting patients care include: none   Reevaluation: After the interventions noted above, I reevaluated the patient and found that they have :improved  Co morbidities that complicate the patient evaluation  Past Medical History:  Diagnosis Date   Anxiety    Asthma    Barrett's esophagus    Blood transfusion without reported diagnosis    Complication of anesthesia    nasuea and vomiting   Food allergy    Headache    Hyperlipidemia    IBS (irritable bowel syndrome)    Iron deficiency anemia    Urticaria       Dispostion: I considered admission for this patient, but at this time he does not meet inpatient criteria for admission and he is safe for discharge the patient follow-up     Final Clinical Impression(s) / ED Diagnoses Final diagnoses:  Acute nonintractable headache, unspecified headache type     @PCDICTATION @    Glendora Score,  MD 06/10/23 [phone]

## 2023-09-10 ENCOUNTER — Encounter: Payer: Self-pay | Admitting: Oncology

## 2023-09-10 ENCOUNTER — Other Ambulatory Visit: Payer: Self-pay

## 2023-09-10 ENCOUNTER — Emergency Department (HOSPITAL_COMMUNITY)
Admission: EM | Admit: 2023-09-10 | Discharge: 2023-09-10 | Disposition: A | Discharge status: Home / Self Care | Payer: Medicare (Managed Care) | Attending: Emergency Medicine | Admitting: Emergency Medicine

## 2023-09-10 ENCOUNTER — Encounter (HOSPITAL_COMMUNITY): Payer: Self-pay | Admitting: Emergency Medicine

## 2023-09-10 ENCOUNTER — Emergency Department (HOSPITAL_COMMUNITY): Payer: Medicare (Managed Care)

## 2023-09-10 DIAGNOSIS — R339 Retention of urine, unspecified: Secondary | ICD-10-CM | POA: Insufficient documentation

## 2023-09-10 DIAGNOSIS — R000 Tachycardia, unspecified: Secondary | ICD-10-CM | POA: Insufficient documentation

## 2023-09-10 DIAGNOSIS — I2510 Atherosclerotic heart disease of native coronary artery without angina pectoris: Secondary | ICD-10-CM | POA: Insufficient documentation

## 2023-09-10 DIAGNOSIS — R109 Unspecified abdominal pain: Secondary | ICD-10-CM | POA: Diagnosis not present

## 2023-09-10 DIAGNOSIS — R112 Nausea with vomiting, unspecified: Secondary | ICD-10-CM | POA: Insufficient documentation

## 2023-09-10 DIAGNOSIS — Z7982 Long term (current) use of aspirin: Secondary | ICD-10-CM | POA: Diagnosis not present

## 2023-09-10 LAB — HEPATIC FUNCTION PANEL
ALT: 31 U/L (ref 0–44)
AST: 27 U/L (ref 15–41)
Albumin: 3.2 g/dL — ABNORMAL LOW (ref 3.5–5.0)
Alkaline Phosphatase: 64 U/L (ref 38–126)
Bilirubin, Direct: 0.2 mg/dL (ref 0.0–0.2)
Indirect Bilirubin: 0.5 mg/dL (ref 0.3–0.9)
Total Bilirubin: 0.7 mg/dL (ref 0.3–1.2)
Total Protein: 6.3 g/dL — ABNORMAL LOW (ref 6.5–8.1)

## 2023-09-10 LAB — CBC WITH DIFFERENTIAL/PLATELET
Abs Immature Granulocytes: 0.03 10*3/uL (ref 0.00–0.07)
Basophils Absolute: 0.1 10*3/uL (ref 0.0–0.1)
Basophils Relative: 1 %
Eosinophils Absolute: 0 10*3/uL (ref 0.0–0.5)
Eosinophils Relative: 1 %
HCT: 40.5 % (ref 39.0–52.0)
Hemoglobin: 13.3 g/dL (ref 13.0–17.0)
Immature Granulocytes: 1 %
Lymphocytes Relative: 21 %
Lymphs Abs: 1.3 10*3/uL (ref 0.7–4.0)
MCH: 31.4 pg (ref 26.0–34.0)
MCHC: 32.8 g/dL (ref 30.0–36.0)
MCV: 95.5 fL (ref 80.0–100.0)
Monocytes Absolute: 0.7 10*3/uL (ref 0.1–1.0)
Monocytes Relative: 11 %
Neutro Abs: 4.1 10*3/uL (ref 1.7–7.7)
Neutrophils Relative %: 65 %
Platelets: 411 10*3/uL — ABNORMAL HIGH (ref 150–400)
RBC: 4.24 MIL/uL (ref 4.22–5.81)
RDW: 13.1 % (ref 11.5–15.5)
WBC: 6.2 10*3/uL (ref 4.0–10.5)
nRBC: 0 % (ref 0.0–0.2)

## 2023-09-10 LAB — URINALYSIS, ROUTINE W REFLEX MICROSCOPIC
Bilirubin Urine: NEGATIVE
Glucose, UA: NEGATIVE mg/dL
Hgb urine dipstick: NEGATIVE
Ketones, ur: NEGATIVE mg/dL
Leukocytes,Ua: NEGATIVE
Nitrite: NEGATIVE
Protein, ur: NEGATIVE mg/dL
Specific Gravity, Urine: 1.006 (ref 1.005–1.030)
pH: 6 (ref 5.0–8.0)

## 2023-09-10 LAB — BASIC METABOLIC PANEL
Anion gap: 8 (ref 5–15)
BUN: 18 mg/dL (ref 6–20)
CO2: 26 mmol/L (ref 22–32)
Calcium: 8.7 mg/dL — ABNORMAL LOW (ref 8.9–10.3)
Chloride: 101 mmol/L (ref 98–111)
Creatinine, Ser: 0.7 mg/dL (ref 0.61–1.24)
GFR, Estimated: >60 mL/min (ref >60)
Glucose, Bld: 100 mg/dL — ABNORMAL HIGH (ref 70–99)
Potassium: 3.9 mmol/L (ref 3.5–5.1)
Sodium: 135 mmol/L (ref 135–145)

## 2023-09-10 LAB — TROPONIN I (HIGH SENSITIVITY)
Troponin I (High Sensitivity): 5 ng/L (ref <18)
Troponin I (High Sensitivity): 5 ng/L (ref <18)

## 2023-09-10 LAB — LIPASE, BLOOD: Lipase: 26 U/L (ref 11–51)

## 2023-09-10 LAB — MAGNESIUM: Magnesium: 1.9 mg/dL (ref 1.7–2.4)

## 2023-09-10 MED ORDER — PROMETHAZINE HCL 6.25 MG/5ML PO SOLN: 25.0000 mg | Freq: Three times a day (TID) | ORAL | 2 refills | Status: AC | PRN

## 2023-09-10 MED ORDER — DIPHENHYDRAMINE HCL 50 MG/ML IJ SOLN
12.5000 mg | Freq: Once | INTRAMUSCULAR | Status: AC
  Administered 2023-09-10: 12.5 mg via INTRAVENOUS
  Filled 2023-09-10: qty 1

## 2023-09-10 MED ORDER — HYDROMORPHONE HCL 1 MG/ML IJ SOLN
0.5000 mg | Freq: Once | INTRAMUSCULAR | Status: AC
  Administered 2023-09-10: 0.5 mg via INTRAVENOUS
  Filled 2023-09-10: qty 1

## 2023-09-10 MED ORDER — KETOROLAC TROMETHAMINE 15 MG/ML IJ SOLN
15.0000 mg | Freq: Once | INTRAMUSCULAR | Status: AC
  Administered 2023-09-10: 15 mg via INTRAVENOUS
  Filled 2023-09-10: qty 1

## 2023-09-10 MED ORDER — LACTATED RINGERS IV BOLUS
1000.0000 mL | Freq: Once | INTRAVENOUS | Status: AC
  Administered 2023-09-10: 1000 mL via INTRAVENOUS

## 2023-09-10 MED ORDER — IOHEXOL 300 MG/ML  SOLN
100.0000 mL | Freq: Once | INTRAMUSCULAR | Status: AC | PRN
  Administered 2023-09-10: 100 mL via INTRAVENOUS

## 2023-09-10 MED ORDER — HEPARIN SOD (PORK) LOCK FLUSH 100 UNIT/ML IV SOLN
500.0000 [IU] | Freq: Once | INTRAVENOUS | Status: AC
  Administered 2023-09-10: 500 [IU]
  Filled 2023-09-10: qty 5

## 2023-09-10 MED ORDER — PROMETHAZINE HCL 25 MG/ML IJ SOLN
12.5000 mg | Freq: Four times a day (QID) | INTRAMUSCULAR | Status: DC | PRN
  Administered 2023-09-10: 12.5 mg via INTRAMUSCULAR
  Filled 2023-09-10: qty 1

## 2023-09-10 NOTE — ED Triage Notes (Signed)
Per [facility] patient coming from home c/o pain to feeding tube down into left side of groin along with decreased urine output since Sunday. Also having n/v/d since Thursday. Patient was discharged from [facility] on Sunday and requested to be taken there however ems unable to due to short of trucks.

## 2023-09-10 NOTE — ED Notes (Signed)
Patient states he has the urge to urinate but has been unable to urinate anything but a small amount since yesterday. Bladder scan showed . MD notified. Foley order placed.

## 2023-09-10 NOTE — Discharge Instructions (Addendum)
A prescription for Phenergan solution was sent to your pharmacy.  This can be more easily administered through your feeding tube.  Take as needed for nausea.  Keep Foley catheter in until you follow-up with a urologist.  A telephone number for urology office is below.  Call to set up that follow-up appointment.  Otherwise, test results today are reassuring.  Continue home medications as prescribed.  Follow-up with your outpatient providers as scheduled.

## 2023-09-10 NOTE — ED Provider Notes (Signed)
[place] EMERGENCY DEPARTMENT AT [facility] Provider Note   CSN: [id number] Arrival date & time: 09/10/23  1409     History  Chief Complaint  Patient presents with   Emesis   Diarrhea   Groin Pain    Edwin Hall is a 57 y.o. male.   Emesis Associated symptoms: abdominal pain and diarrhea   Diarrhea Associated symptoms: abdominal pain and vomiting   Groin Pain Associated symptoms include abdominal pain.  Patient presents for emesis.  Medical history includes CAD, Barrett's esophagus, hiatal hernia s/p Ivor Lewis esophagectomy in January 2023 complicated by postoperative septic shock, IBS, anemia, anxiety, migraines, esophageal dysmotility, failure to thrive, and recent PEJ tube placement.  He was discharged from [facility] 6 days ago.  He declined placement in rehab facility at time of discharge.  He has been at home managing his own care.  He has a home health nurse that comes by twice a week.  He continues to take food and medicine by mouth during the day.  He does continue with tube feeds at night.  Despite this, he has had a continued 4 pound weight loss since his recent hospital admission.  Starting 4 days ago, he had frequent episodes of emesis.  He has had pain in his left lower quadrant and area of recent tube placement.  Pain radiates to left side of groin.  He denies any testicular pain.  He denies any dysuria.  He does state that he has had decreased urine output.  For his pain, he has been prescribed oral Dilaudid.  He feels this makes his nausea worse.  For his nausea, he typically takes Phenergan.  This has not been working.     Home Medications Prior to Admission medications   Medication Sig Start Date End Date Taking? Authorizing Provider  promethazine (PHENERGAN) 6.25 MG/5ML solution 20 mLs (25 mg total) by Per J Tube route every 8 (eight) hours as needed for nausea or vomiting. 09/10/23  Yes Gloris Manchester, MD  acetaminophen (TYLENOL) 325 MG tablet  Take 2 tablets (650 mg total) by mouth every 6 (six) hours as needed for mild pain, fever or headache (or Fever >/= 101). 05/25/23   Rolly Salter, MD  aspirin 81 MG EC tablet Take 81 mg by mouth at bedtime. Swallow whole.    [provider]  atorvastatin (LIPITOR) 40 MG tablet Take 1 tablet by mouth at bedtime. 04/22/23   [provider]  B Complex-C (B-COMPLEX WITH VITAMIN C) tablet Take 1 tablet by mouth daily. 05/26/23   Rolly Salter, MD  Cholecalciferol (VITAMIN D3) 10 MCG (400 UNIT) CAPS Take 400 Units by mouth daily.    [provider]  copper tablet Take 1 tablet (2 mg total) by mouth daily. 05/26/23   Rolly Salter, MD  Cyanocobalamin (VITAMIN B 12 PO) Take 1,000 mcg by mouth daily.    [provider]  Erenumab-aooe (AIMOVIG) 140 MG/ML SOAJ Inject into the skin. 04/29/23   [provider]  famotidine (PEPCID) 40 MG tablet Take 1 tablet (40 mg total) by mouth daily. 05/25/23   Rolly Salter, MD  Ferrous Sulfate (IRON) 325 (65 Fe) MG TABS Take 325 mg by mouth every other day.  05/12/20   [provider]  HYDROcodone-acetaminophen (NORCO/VICODIN) 5-325 MG tablet Take 1 tablet by mouth every 6 (six) hours as needed for moderate pain or severe pain. 05/25/23 05/24/24  Rolly Salter, MD  magnesium oxide (MAG-OX) 400 (  240 Mg) MG tablet Take 1 tablet (400 mg total) by mouth 2 (two) times daily. 05/25/23   Rolly Salter, MD  Multiple Vitamin (MULTIVITAMIN WITH MINERALS) TABS tablet Take 1 tablet by mouth daily.    [provider]  Oxymetazoline HCl (QC NASAL SPRAY NA) Place 1 spray into the nose daily as needed (migraines). Pt unable to tell me what the name is but states it is supposed to help with migraines    [provider]  pantoprazole (PROTONIX) 40 MG tablet Take 40 mg by mouth 2 (two) times daily.    [provider]  promethazine (PHENERGAN) 25 MG tablet Take 1 tablet (25 mg total) by mouth every 6 (six) hours  as needed for nausea or vomiting. 05/25/23   Rolly Salter, MD  rizatriptan (MAXALT) 10 MG tablet Take by mouth. 03/24/23   [provider]  sucralfate (CARAFATE) 1 g tablet Take 1 tablet (1 g total) by mouth 4 (four) times daily -  with meals and at bedtime. 10/17/20   Tilden Fossa, MD  thiamine (VITAMIN B1) 100 MG tablet Take 1 tablet (100 mg total) by mouth daily. 05/25/23   Rolly Salter, MD  topiramate (TOPAMAX) 25 MG tablet Take 1 tablet (25 mg total) by mouth 2 (two) times daily. 05/25/23   Rolly Salter, MD  traZODone (DESYREL) 50 MG tablet Take 50 mg by mouth at bedtime.    [provider]  Zinc 50 MG TABS Take 50 mg by mouth daily.     [provider]  azelastine (ASTELIN) 0.1 % nasal spray 2 sprays per nostril twice a day for control of nasal drainage. Patient not taking: Reported on 05/02/2020 11/27/19 05/02/20  Marcelyn Bruins, MD      Allergies    Mushroom extract complex, Other, Shellfish allergy, Reglan [metoclopramide], Zofran [ondansetron hcl], Ergotamine, and Tetracyclines & related    Review of Systems   Review of Systems  Constitutional:  Positive for activity change, fatigue and unexpected weight change.  Gastrointestinal:  Positive for abdominal pain, diarrhea, nausea and vomiting.  Genitourinary:  Positive for decreased urine volume.  All other systems reviewed and are negative.   Physical Exam Updated Vital Signs BP (!) 132/98   Pulse 93   Temp 98.2 F (36.8 C) (Oral)   Resp 16   Ht 5\' 8"  (1.727 m)   Wt 49.4 kg   SpO2 100%   BMI 16.57 kg/m  Physical Exam Vitals and nursing note reviewed.  Constitutional:      General: He is not in acute distress.    Appearance: Normal appearance. He is well-developed and underweight. He is not ill-appearing, toxic-appearing or diaphoretic.  HENT:     Head: Normocephalic and atraumatic.     Right Ear: External ear normal.     Left Ear: External ear normal.     Nose: Nose normal.      Mouth/Throat:     Mouth: Mucous membranes are moist.  Eyes:     Extraocular Movements: Extraocular movements intact.     Conjunctiva/sclera: Conjunctivae normal.  Cardiovascular:     Rate and Rhythm: Regular rhythm. Tachycardia present.     Heart sounds: No murmur heard. Pulmonary:     Effort: Pulmonary effort is normal. No respiratory distress.     Breath sounds: Normal breath sounds. No wheezing or rales.  Chest:     Chest wall: No tenderness.  Abdominal:     General: There is no distension.  Palpations: Abdomen is soft.     Tenderness: There is abdominal tenderness. There is no guarding or rebound.  Musculoskeletal:        General: No swelling. Normal range of motion.     Cervical back: Normal range of motion and neck supple.  Skin:    General: Skin is warm and dry.     Coloration: Skin is not jaundiced or pale.  Neurological:     General: No focal deficit present.     Mental Status: He is alert and oriented to person, place, and time.  Psychiatric:        Mood and Affect: Mood normal.        Behavior: Behavior normal.     ED Results / Procedures / Treatments   Labs (all labs ordered are listed, but only abnormal results are displayed) Labs Reviewed  CBC WITH DIFFERENTIAL/PLATELET - Abnormal; Notable for the following components:      Result Value   Platelets 411 (*)    All other components within normal limits  URINALYSIS, ROUTINE W REFLEX MICROSCOPIC - Abnormal; Notable for the following components:   Color, Urine STRAW (*)    All other components within normal limits  HEPATIC FUNCTION PANEL - Abnormal; Notable for the following components:   Total Protein 6.3 (*)    Albumin 3.2 (*)    All other components within normal limits  BASIC METABOLIC PANEL - Abnormal; Notable for the following components:   Glucose, Bld 100 (*)    Calcium 8.7 (*)    All other components within normal limits  LIPASE, BLOOD  MAGNESIUM  TROPONIN I (HIGH SENSITIVITY)  TROPONIN  I (HIGH SENSITIVITY)    EKG EKG Interpretation Date/Time:  Saturday September 10 2023 15:28:53 EDT Ventricular Rate:  97 PR Interval:  154 QRS Duration:  93 QT Interval:  365 QTC Calculation: 464 R Axis:   14  Text Interpretation: Sinus rhythm Right atrial enlargement Abnormal R-wave progression, early transition Probable inferior infarct, old Confirmed by Gloris Manchester [phone]) on 09/10/2023 3:38:48 PM  Radiology CT ABDOMEN PELVIS W CONTRAST  Result Date: 09/10/2023 CLINICAL DATA:  Acute abdominal pain.  Pain at feeding tube site. EXAM: CT ABDOMEN AND PELVIS WITH CONTRAST TECHNIQUE: Multidetector CT imaging of the abdomen and pelvis was performed using the standard protocol following bolus administration of intravenous contrast. RADIATION DOSE REDUCTION: This exam was performed according to the departmental dose-optimization program which includes automated exposure control, adjustment of the mA and/or kV according to patient size and/or use of iterative reconstruction technique. CONTRAST:  OMNIPAQUE IOHEXOL 300 MG/ML  SOLN COMPARISON:  CT chest abdomen and pelvis 11/26/2022 FINDINGS: Lower chest: Scarring and postsurgical changes are again seen in the left lung base. There is elevation of the left hemidiaphragm, unchanged. Hepatobiliary: No focal liver abnormality is seen. Status post cholecystectomy. No biliary dilatation. Pancreas: Unremarkable. No pancreatic ductal dilatation or surrounding inflammatory changes. Spleen: Normal in size without focal abnormality. Adrenals/Urinary Tract: Adrenal glands are unremarkable. Kidneys are normal, without renal calculi, focal lesion, or hydronephrosis. Bladder is unremarkable. Stomach/Bowel: Patient is status post partial gastric pull-through surgery. The visualized stomach is nondilated. There is dense contrast seen throughout the colon. The appendix is not definitely seen. There is no bowel obstruction, pneumatosis or free air identified. Percutaneous  jejunostomy tube in place. Note is made that exact placement of the jejunostomy balloon/tip can not be confirmed on this study. There is no surrounding free air, but there is abdominal wall swelling, fluid and  stranding. No focal abscess. Vascular/Lymphatic: Aortic atherosclerosis. No enlarged abdominal or pelvic lymph nodes. Reproductive: Prostate is unremarkable. Other: No abdominal wall hernia or abnormality. No abdominopelvic ascites. Musculoskeletal: No acute or significant osseous findings. IMPRESSION: 1. Percutaneous jejunostomy tube in place. Note is made that exact placement of the jejunostomy balloon/tip can not be confirmed on this study. There is no surrounding free air, but there is abdominal wall swelling, fluid and stranding. Correlate clinically for infection. No focal abscess. 2. Status post partial gastric pull-through surgery. Aortic Atherosclerosis (ICD10-I70.0). Electronically Signed   By: Darliss Cheney M.D.   On: 09/10/2023 19:12    Procedures Procedures    Medications Ordered in ED Medications  promethazine (PHENERGAN) injection 12.5 mg (12.5 mg Intramuscular Given 09/10/23 1523)  HYDROmorphone (DILAUDID) injection 0.5 mg (0.5 mg Intravenous Given 09/10/23 1524)  lactated ringers bolus 1,000 mL (0 mLs Intravenous Stopped 09/10/23 1802)  HYDROmorphone (DILAUDID) injection 0.5 mg (0.5 mg Intravenous Given 09/10/23 1706)  iohexol (OMNIPAQUE) 300 MG/ML solution 100 mL (100 mLs Intravenous Contrast Given 09/10/23 1733)  diphenhydrAMINE (BENADRYL) injection 12.5 mg (12.5 mg Intravenous Given 09/10/23 1929)  ketorolac (TORADOL) 15 MG/ML injection 15 mg (15 mg Intravenous Given 09/10/23 2011)    ED Course/ Medical Decision Making/ A&P                                 Medical Decision Making Amount and/or Complexity of Data Reviewed Labs: ordered. Radiology: ordered.  Risk Prescription drug management.   This patient presents to the ED for concern of abdominal pain and  emesis, this involves an extensive number of treatment options, and is a complaint that carries with it a high risk of complications and morbidity.  The differential diagnosis includes postoperative complication from recent feeding tube placement, chronic pain, hernia, SBO   Co morbidities that complicate the patient evaluation  CAD, Barrett's esophagus, hiatal hernia s/p Ivor Lewis esophagectomy in January 2023 complicated by postoperative septic shock, IBS, anemia, anxiety, migraines, esophageal dysmotility, failure to thrive, and recent PEJ tube placement   Additional history obtained:  Additional history obtained from N/A External records from outside source obtained and reviewed including EMR   Lab Tests:  I Ordered, and personally interpreted labs.  The pertinent results include: Normal hemoglobin, no leukocytosis, normal kidney function, normal electrolytes, normal lipase, normal hepatobiliary enzymes, no evidence of UTI   Imaging Studies ordered:  I ordered imaging studies including CT of abdomen and pelvis I independently visualized and interpreted imaging which send abdominal wall thickening and stranding.  In the absence of leukocytosis or systemic symptoms, low likelihood of infection.  There were no drainable fluid collections.  There were no other acute findings. I agree with the radiologist interpretation   Cardiac Monitoring: / EKG:  The patient was maintained on a cardiac monitor.  I personally viewed and interpreted the cardiac monitored which showed an underlying rhythm of: Sinus rhythm   Problem List / ED Course / Critical interventions / Medication management  Patient presents for abdominal pain, nausea, and vomiting.  He has a long history of frequent episodes of nausea and emesis.  He underwent PEG placement during his recent hospital admission, from which she was discharged 6 days ago.  He has been taking food and medicines by mouth and utilizing tube feeds at  night for treatment of his ongoing malnutrition.  He has had recurrence of nausea and vomiting refractory to his home  doses of Phenergan.  He has had ongoing weight loss despite nighttime tube feeds.  For this reason, he presents to the ED.  Vital signs on arrival notable for tachycardia.  On exam, patient is alert and oriented.  His abdomen is soft.  Tube is in place in left side of abdomen.  There is no surrounding erythema, induration, or discharge.  He does endorse tenderness to this area.  Ativan and Dilaudid ordered for analgesia.  IV fluids were ordered for hydration.  Workup was initiated.  Patient had repeated request for pain medication while in the ED.  Laboratory workup was unremarkable.  CT imaging did not show any acute findings to explain his symptoms.  He does have some areas of abdominal wall thickening and stranding.  In the absence of a leukocytosis or systemic symptoms, I have low reason to suspect this is indicative of an infection.  There were no areas of drainable fluid collections.  Jejunostomy tube appears to be appropriately positioned.  Although he was able to urinate while in the ED, he has only had small amounts.  Postvoid residual showed 600 cc of retained urine.  Foley catheter was ordered.  Following Foley catheter placement, patient resting comfortably.  He was prescribed Phenergan solution to administer through his feeding tube as needed.  He was discharged in stable condition. I ordered medication including Dilaudid and Toradol for analgesia; Phenergan for nausea; IV fluids for hydration Reevaluation of the patient after these medicines showed that the patient improved I have reviewed the patients home medicines and have made adjustments as needed   Social Determinants of Health:  Lives independently        Final Clinical Impression(s) / ED Diagnoses Final diagnoses:  Nausea and vomiting, unspecified vomiting type  Urine retention    Rx / DC Orders ED  Discharge Orders          Ordered    promethazine (PHENERGAN) 6.25 MG/5ML solution  Every 8 hours PRN        09/10/23 2035              Gloris Manchester, MD 09/10/23 2038

## 2023-10-31 ENCOUNTER — Other Ambulatory Visit: Payer: Self-pay

## 2023-10-31 ENCOUNTER — Emergency Department (HOSPITAL_COMMUNITY)
Admission: EM | Admit: 2023-10-31 | Discharge: 2023-11-01 | Disposition: A | Discharge status: Home / Self Care | Payer: Medicare (Managed Care) | Attending: Student | Admitting: Student

## 2023-10-31 ENCOUNTER — Encounter (HOSPITAL_COMMUNITY): Payer: Self-pay

## 2023-10-31 ENCOUNTER — Emergency Department (HOSPITAL_COMMUNITY): Payer: Medicare (Managed Care)

## 2023-10-31 DIAGNOSIS — Z79899 Other long term (current) drug therapy: Secondary | ICD-10-CM | POA: Diagnosis not present

## 2023-10-31 DIAGNOSIS — R1032 Left lower quadrant pain: Secondary | ICD-10-CM | POA: Insufficient documentation

## 2023-10-31 DIAGNOSIS — R112 Nausea with vomiting, unspecified: Secondary | ICD-10-CM | POA: Diagnosis present

## 2023-10-31 DIAGNOSIS — E876 Hypokalemia: Secondary | ICD-10-CM | POA: Insufficient documentation

## 2023-10-31 DIAGNOSIS — R1012 Left upper quadrant pain: Secondary | ICD-10-CM | POA: Insufficient documentation

## 2023-10-31 DIAGNOSIS — Z7982 Long term (current) use of aspirin: Secondary | ICD-10-CM | POA: Insufficient documentation

## 2023-10-31 DIAGNOSIS — I10 Essential (primary) hypertension: Secondary | ICD-10-CM | POA: Insufficient documentation

## 2023-10-31 DIAGNOSIS — J45909 Unspecified asthma, uncomplicated: Secondary | ICD-10-CM | POA: Insufficient documentation

## 2023-10-31 DIAGNOSIS — G8929 Other chronic pain: Secondary | ICD-10-CM

## 2023-10-31 LAB — CBC
HCT: 47.4 % (ref 39.0–52.0)
Hemoglobin: 15.4 g/dL (ref 13.0–17.0)
MCH: 30.3 pg (ref 26.0–34.0)
MCHC: 32.5 g/dL (ref 30.0–36.0)
MCV: 93.3 fL (ref 80.0–100.0)
Platelets: 213 10*3/uL (ref 150–400)
RBC: 5.08 MIL/uL (ref 4.22–5.81)
RDW: 13 % (ref 11.5–15.5)
WBC: 6.4 10*3/uL (ref 4.0–10.5)
nRBC: 0 % (ref 0.0–0.2)

## 2023-10-31 LAB — COMPREHENSIVE METABOLIC PANEL
ALT: 19 U/L (ref 0–44)
AST: 25 U/L (ref 15–41)
Albumin: 3.7 g/dL (ref 3.5–5.0)
Alkaline Phosphatase: 66 U/L (ref 38–126)
Anion gap: 10 (ref 5–15)
BUN: 16 mg/dL (ref 6–20)
CO2: 23 mmol/L (ref 22–32)
Calcium: 8.6 mg/dL — ABNORMAL LOW (ref 8.9–10.3)
Chloride: 100 mmol/L (ref 98–111)
Creatinine, Ser: 0.8 mg/dL (ref 0.61–1.24)
GFR, Estimated: >60 mL/min (ref >60)
Glucose, Bld: 126 mg/dL — ABNORMAL HIGH (ref 70–99)
Potassium: 3.1 mmol/L — ABNORMAL LOW (ref 3.5–5.1)
Sodium: 133 mmol/L — ABNORMAL LOW (ref 135–145)
Total Bilirubin: 1.2 mg/dL — ABNORMAL HIGH (ref <1.2)
Total Protein: 6.6 g/dL (ref 6.5–8.1)

## 2023-10-31 LAB — I-STAT CHEM 8, ED
BUN: 16 mg/dL (ref 6–20)
Calcium, Ion: 1.13 mmol/L — ABNORMAL LOW (ref 1.15–1.40)
Chloride: 98 mmol/L (ref 98–111)
Creatinine, Ser: 0.9 mg/dL (ref 0.61–1.24)
Glucose, Bld: 126 mg/dL — ABNORMAL HIGH (ref 70–99)
HCT: 46 % (ref 39.0–52.0)
Hemoglobin: 15.6 g/dL (ref 13.0–17.0)
Potassium: 3.3 mmol/L — ABNORMAL LOW (ref 3.5–5.1)
Sodium: 137 mmol/L (ref 135–145)
TCO2: 23 mmol/L (ref 22–32)

## 2023-10-31 LAB — I-STAT CG4 LACTIC ACID, ED: Lactic Acid, Venous: 2.9 mmol/L (ref 0.5–1.9)

## 2023-10-31 LAB — LIPASE, BLOOD: Lipase: 21 U/L (ref 11–51)

## 2023-10-31 MED ORDER — IOHEXOL 300 MG/ML  SOLN
80.0000 mL | Freq: Once | INTRAMUSCULAR | Status: AC | PRN
  Administered 2023-10-31: 80 mL via INTRAVENOUS

## 2023-10-31 MED ORDER — LACTATED RINGERS IV BOLUS
1000.0000 mL | Freq: Once | INTRAVENOUS | Status: AC
  Administered 2023-10-31: 1000 mL via INTRAVENOUS

## 2023-10-31 MED ORDER — MORPHINE SULFATE (PF) 4 MG/ML IV SOLN
4.0000 mg | Freq: Once | INTRAVENOUS | Status: AC
  Administered 2023-10-31: 4 mg via INTRAVENOUS
  Filled 2023-10-31: qty 1

## 2023-10-31 MED ORDER — MORPHINE SULFATE (PF) 4 MG/ML IV SOLN
6.0000 mg | Freq: Once | INTRAVENOUS | Status: AC
  Administered 2023-10-31: 6 mg via INTRAVENOUS
  Filled 2023-10-31: qty 2

## 2023-10-31 MED ORDER — SODIUM CHLORIDE 0.9 % IV SOLN
12.5000 mg | Freq: Four times a day (QID) | INTRAVENOUS | Status: DC | PRN
  Administered 2023-11-01: 12.5 mg via INTRAVENOUS
  Filled 2023-10-31: qty 12.5

## 2023-10-31 NOTE — ED Provider Notes (Signed)
Care assumed at 2300.  Patient with history of GJ tube following esophagectomy for Barrett's esophagus here for evaluation of abdominal pain rating to his back, emesis.  Care assumed pending a CT abdomen pelvis, reassessment.  Labs with mild hypokalemia, this is replaced per GJ tube.  No significant emesis in the emergency department.  Pain is partially improved with medications.  Imaging is reassuring with no evidence of complications related to prior surgeries or GJ tube.  Tube site appears well without any surrounding erythema or skin changes.  Feel patient is stable for discharge with GI follow-up.  He has already been treated with IV fluids, potassium replacement as well as pain and emesis control.   Tilden Fossa, MD 11/01/23 [phone]

## 2023-10-31 NOTE — ED Provider Triage Note (Signed)
Emergency Medicine Provider Triage Evaluation Note  Edwin Hall , a 57 y.o. male  was evaluated in triage.  Pt complains of nausea, vomiting, and abdominal pain. Has feeding tube, states he had it placed about 4 months ago because he is malnourished. 1 month ago he started having pain. States it hurts to push feeds through it. Started vomiting up his feeds a few days ago. Notes diarrhea as well.   Review of Systems  Positive:  Negative:   Physical Exam  BP 118/85   Pulse (!) 110   Temp (!) 97.5 F (36.4 C) (Oral)   Resp 16   Ht 5\' 8"  (1.727 m)   Wt 47.2 kg   SpO2 100%   BMI 15.81 kg/m  Gen:   Awake, no distress   Resp:  Normal effort  MSK:   Moves extremities without difficulty  Other:  Feeding tube in left abdomen, noninfectious appearing. Tenderness noted  Medical Decision Making  Medically screening exam initiated at 4:39 PM.  Appropriate orders placed.  Edwin Hall was informed that the remainder of the evaluation will be completed by another provider, this initial triage assessment does not replace that evaluation, and the importance of remaining in the ED until their evaluation is complete.  Work-up initiated   Edwin Hall 10/31/23 1641

## 2023-10-31 NOTE — ED Provider Notes (Signed)
[facility] AT [facility] Provider Note  CSN: [id number] Arrival date & time: 10/31/23 1555  Chief Complaint(s) Emesis  HPI Edwin Hall is a 57 y.o. male with PMH Barrett's esophagus, HTN, HLD, small bowel obstructions status post G-tube placement, migraine disorder who presents emergency department for evaluation of abdominal pain nausea and vomiting.  Patient states that over the last 3 days his left upper and left lower quadrant abdominal pain has been worsening with associated nausea and vomiting.  Patient was seen yesterday at [facility] with overall negative workup.  Denies current chest pain, shortness of breath, headache, fever or other systemic symptoms.   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   Depression 09/22/2022   Bruising 09/15/2022   Malabsorption syndrome 09/15/2022   Chronic arthralgias of knees and hips 09/15/2022   Malnutrition of moderate degree 07/15/2022   Abdominal pain 07/14/2022   Diarrhea 07/14/2022   FTT (failure to thrive) in adult 10/04/2020   Intractable nausea and vomiting 10/04/2020   Intractable vomiting 06/26/2020   Moderate protein-calorie malnutrition (HCC) 06/26/2020   IBS (irritable bowel syndrome) 06/26/2020   Dyslipidemia 06/26/2020   HTN (hypertension) 06/26/2020   Iron deficiency anemia due to dietary causes 06/26/2020   Pneumothorax 05/15/2020   Nausea & vomiting 05/15/2020   Unintentional weight  loss 05/15/2020   Home Medication(s) Prior to Admission medications   Medication Sig Start Date End Date Taking? Authorizing Provider  acetaminophen (TYLENOL) 325 MG tablet Take 2 tablets (650 mg total) by mouth every 6 (six) hours as needed for mild pain, fever or headache (or Fever >/= 101). 05/25/23   Rolly Salter, MD  aspirin 81 MG EC tablet Take 81 mg by mouth at bedtime. Swallow whole.    [provider]  atorvastatin (LIPITOR) 40 MG tablet Take 1 tablet by mouth at bedtime. 04/22/23   [provider]  B Complex-C (B-COMPLEX WITH VITAMIN C) tablet Take 1 tablet by mouth daily. 05/26/23   Rolly Salter, MD  Cholecalciferol (VITAMIN D3) 10 MCG (400 UNIT) CAPS Take 400 Units by mouth daily.    [provider]  copper tablet Take 1 tablet (2 mg total) by mouth daily. 05/26/23   Rolly Salter, MD  Cyanocobalamin (VITAMIN B 12 PO) Take 1,000 mcg by mouth daily.    [provider]  Erenumab-aooe (AIMOVIG) 140 MG/ML SOAJ Inject into the skin. 04/29/23   [provider]  famotidine (PEPCID) 40 MG tablet Take 1 tablet (40 mg total) by mouth daily. 05/25/23   Rolly Salter, MD  Ferrous Sulfate (IRON) 325 (65 Fe) MG TABS Take 325 mg by mouth every other day.  05/12/20   [provider]  HYDROcodone-acetaminophen (NORCO/VICODIN) 5-325 MG tablet Take 1 tablet by mouth every 6 (six) hours as needed for moderate pain or severe pain. 05/25/23 05/24/24  Rolly Salter, MD  magnesium  oxide (MAG-OX) 400 (240 Mg) MG tablet Take 1 tablet (400 mg total) by mouth 2 (two) times daily. 05/25/23   Rolly Salter, MD  Multiple Vitamin (MULTIVITAMIN WITH MINERALS) TABS tablet Take 1 tablet by mouth daily.    [provider]  Oxymetazoline HCl (QC NASAL SPRAY NA) Place 1 spray into the nose daily as needed (migraines). Pt unable to tell me what the name is but states it is supposed to help with migraines    [provider]  pantoprazole (PROTONIX) 40  MG tablet Take 40 mg by mouth 2 (two) times daily.    [provider]  promethazine (PHENERGAN) 25 MG tablet Take 1 tablet (25 mg total) by mouth every 6 (six) hours as needed for nausea or vomiting. 05/25/23   Rolly Salter, MD  promethazine (PHENERGAN) 6.25 MG/5ML solution 20 mLs (25 mg total) by Per J Tube route every 8 (eight) hours as needed for nausea or vomiting. 09/10/23   Gloris Manchester, MD  rizatriptan (MAXALT) 10 MG tablet Take by mouth. 03/24/23   [provider]  sucralfate (CARAFATE) 1 g tablet Take 1 tablet (1 g total) by mouth 4 (four) times daily -  with meals and at bedtime. 10/17/20   Tilden Fossa, MD  thiamine (VITAMIN B1) 100 MG tablet Take 1 tablet (100 mg total) by mouth daily. 05/25/23   Rolly Salter, MD  topiramate (TOPAMAX) 25 MG tablet Take 1 tablet (25 mg total) by mouth 2 (two) times daily. 05/25/23   Rolly Salter, MD  traZODone (DESYREL) 50 MG tablet Take 50 mg by mouth at bedtime.    [provider]  Zinc 50 MG TABS Take 50 mg by mouth daily.     [provider]  azelastine (ASTELIN) 0.1 % nasal spray 2 sprays per nostril twice a day for control of nasal drainage. Patient not taking: Reported on 05/02/2020 11/27/19 05/02/20  Marcelyn Bruins, MD                                                                                                                                    Past Surgical History Past Surgical History:  Procedure Laterality Date   ABDOMINAL SURGERY  12/2015   pylorus plasty   APPENDECTOMY  1984   BIOPSY  05/18/2020   Procedure: BIOPSY;  Surgeon: Kerin Salen, MD;  Location: [facility];  Service: Gastroenterology;;   CHOLECYSTECTOMY  2008   ESOPHAGOGASTRODUODENOSCOPY (EGD) WITH PROPOFOL N/A 05/18/2020   Procedure: ESOPHAGOGASTRODUODENOSCOPY (EGD) WITH PROPOFOL;  Surgeon: Kerin Salen, MD;  Location: [facility];  Service: Gastroenterology;  Laterality: N/A;   GASTROSTOMY TUBE PLACEMENT     HIATAL HERNIA  REPAIR  07/2015   PARTIAL ESOPHAGECTOMY  12/10/2021   TALC PLEURODESIS     TONSILLECTOMY  1980   VENTRAL HERNIA REPAIR  2010   Family History Family History  Problem Relation Age of Onset  Parkinson's disease Mother    Angioedema Mother    Dementia Mother    Heart disease Father    Lung cancer Brother 82       non-smoker   Allergic rhinitis Neg Hx    Asthma Neg Hx    Eczema Neg Hx    Immunodeficiency Neg Hx    Urticaria Neg Hx     Social History Social History   Tobacco Use   Smoking status: Never    Passive exposure: Never   Smokeless tobacco: Never  Vaping Use   Vaping status: Never Used  Substance Use Topics   Alcohol use: No   Drug use: No   Allergies Mushroom extract complex (do not select), Other, Shellfish allergy, Reglan [metoclopramide], Zofran [ondansetron hcl], Ergotamine, and Tetracyclines & related  Review of Systems Review of Systems  Gastrointestinal:  Positive for abdominal pain, nausea and vomiting.    Physical Exam Vital Signs  I have reviewed the triage vital signs BP (!) 116/90 (BP Location: Right Arm)   Pulse (!) 111   Temp 98 F (36.7 C) (Oral)   Resp 20   Ht 5\' 8"  (1.727 m)   Wt 47.2 kg   SpO2 99%   BMI 15.81 kg/m   Physical Exam Constitutional:      General: He is not in acute distress.    Appearance: Normal appearance.  HENT:     Head: Normocephalic and atraumatic.     Nose: No congestion or rhinorrhea.  Eyes:     General:        Right eye: No discharge.        Left eye: No discharge.     Extraocular Movements: Extraocular movements intact.     Pupils: Pupils are equal, round, and reactive to light.  Cardiovascular:     Rate and Rhythm: Normal rate and regular rhythm.     Heart sounds: No murmur heard. Pulmonary:     Effort: No respiratory distress.     Breath sounds: No wheezing or rales.  Abdominal:     General: There is no distension.     Tenderness: There is abdominal tenderness.  Musculoskeletal:         General: Normal range of motion.     Cervical back: Normal range of motion.  Skin:    General: Skin is warm and dry.  Neurological:     General: No focal deficit present.     Mental Status: He is alert.     ED Results and Treatments Labs (all labs ordered are listed, but only abnormal results are displayed) Labs Reviewed  COMPREHENSIVE METABOLIC PANEL - Abnormal; Notable for the following components:      Result Value   Sodium 133 (*)    Potassium 3.1 (*)    Glucose, Bld 126 (*)    Calcium 8.6 (*)    Total Bilirubin 1.2 (*)    All other components within normal limits  I-STAT CG4 LACTIC ACID, ED - Abnormal; Notable for the following components:   Lactic Acid, Venous 2.9 (*)    All other components within normal limits  I-STAT CHEM 8, ED - Abnormal; Notable for the following components:   Potassium 3.3 (*)    Glucose, Bld 126 (*)    Calcium, Ion 1.13 (*)    All other components within normal limits  LIPASE, BLOOD  CBC  URINALYSIS, ROUTINE W REFLEX MICROSCOPIC  I-STAT CG4 LACTIC ACID, ED  Radiology No results found.  Pertinent labs & imaging results that were available during my care of the patient were reviewed by me and considered in my medical decision making (see MDM for details).  Medications Ordered in ED Medications  promethazine (PHENERGAN) 12.5 mg in sodium chloride 0.9 % 50 mL IVPB (has no administration in time range)  lactated ringers bolus 1,000 mL (1,000 mLs Intravenous New Bag/Given 10/31/23 2231)  morphine (PF) 4 MG/ML injection 4 mg (4 mg Intravenous Given 10/31/23 2225)                                                                                                                                     Procedures Procedures  (including critical care time)  Medical Decision Making / ED Course   This patient presents to the ED for  concern of abdominal pain nausea and vomiting, this involves an extensive number of treatment options, and is a complaint that carries with it a high risk of complications and morbidity.  The differential diagnosis includes diverticulitis, epiploic appendagitis, colitis, gastroenteritis, constipation, nephrolithiasis, inflammatory bowel disease, SBO  MDM: Patient seen emergency room for evaluation of abdominal pain.  Physical exam with G-tube in place with no surrounding erythema purulence.  Tenderness to palpation in the left upper and left lower quadrants.  Initial laboratory evaluation with a lactic of 2.9, potassium 3.1 but is otherwise unremarkable.  Pain control initiated and patient is pending CT abdomen pelvis at time of signout.  Please see provider signout note for continuation of workup.   Additional history obtained:  -External records from outside source obtained and reviewed including: Chart review including previous notes, labs, imaging, consultation notes   Lab Tests: -I ordered, reviewed, and interpreted labs.   The pertinent results include:   Labs Reviewed  COMPREHENSIVE METABOLIC PANEL - Abnormal; Notable for the following components:      Result Value   Sodium 133 (*)    Potassium 3.1 (*)    Glucose, Bld 126 (*)    Calcium 8.6 (*)    Total Bilirubin 1.2 (*)    All other components within normal limits  I-STAT CG4 LACTIC ACID, ED - Abnormal; Notable for the following components:   Lactic Acid, Venous 2.9 (*)    All other components within normal limits  I-STAT CHEM 8, ED - Abnormal; Notable for the following components:   Potassium 3.3 (*)    Glucose, Bld 126 (*)    Calcium, Ion 1.13 (*)    All other components within normal limits  LIPASE, BLOOD  CBC  URINALYSIS, ROUTINE W REFLEX MICROSCOPIC  I-STAT CG4 LACTIC ACID, ED      EKG   EKG Interpretation Date/Time:  Monday October 31 2023 16:09:56 EST Ventricular Rate:  109 PR Interval:  149 QRS  Duration:  96 QT Interval:  349 QTC Calculation: 470 R Axis:   27  Text Interpretation: Sinus tachycardia Confirmed  by Glendora Score [phone]) on 10/31/2023 11:11:14 PM         Imaging Studies ordered: I ordered imaging studies including CTAP and this is pending   Medicines ordered and prescription drug management: Meds ordered this encounter  Medications   lactated ringers bolus 1,000 mL   morphine (PF) 4 MG/ML injection 4 mg   promethazine (PHENERGAN) 12.5 mg in sodium chloride 0.9 % 50 mL IVPB    -I have reviewed the patients home medicines and have made adjustments as needed  Critical interventions none    Cardiac Monitoring: The patient was maintained on a cardiac monitor.  I personally viewed and interpreted the cardiac monitored which showed an underlying rhythm of: NSr  Social Determinants of Health:  Factors impacting patients care include: none   Reevaluation: After the interventions noted above, I reevaluated the patient and found that they have :improved  Co morbidities that complicate the patient evaluation  Past Medical History:  Diagnosis Date   Anxiety    Asthma    Barrett's esophagus    Blood transfusion without reported diagnosis    Complication of anesthesia    nasuea and vomiting   Food allergy    Headache    Hyperlipidemia    IBS (irritable bowel syndrome)    Iron deficiency anemia    Urticaria       Dispostion: I considered admission for this patient, and disposition pending completion of CT imaging.  Please see provider signout note continuation of workup.     Final Clinical Impression(s) / ED Diagnoses Final diagnoses:  None     @PCDICTATION @    Glendora Score, MD 11/01/23 1153

## 2023-10-31 NOTE — ED Triage Notes (Signed)
Feeding tube on left side of abdomen, site does not have redness or discharge. Pt reports he is having pain in abdomen all the way into back. Pain started a few days ago and pt is vomiting.

## 2023-10-31 NOTE — ED Notes (Signed)
Called patient 3 times for vitals, no response.

## 2023-11-01 ENCOUNTER — Encounter: Payer: Self-pay | Admitting: Oncology

## 2023-11-01 LAB — URINALYSIS, ROUTINE W REFLEX MICROSCOPIC
Bilirubin Urine: NEGATIVE
Glucose, UA: NEGATIVE mg/dL
Hgb urine dipstick: NEGATIVE
Ketones, ur: 5 mg/dL — AB
Leukocytes,Ua: NEGATIVE
Nitrite: NEGATIVE
Protein, ur: NEGATIVE mg/dL
Specific Gravity, Urine: 1.032 — ABNORMAL HIGH (ref 1.005–1.030)
pH: 5 (ref 5.0–8.0)

## 2023-11-01 MED ORDER — POTASSIUM CHLORIDE 20 MEQ PO PACK
40.0000 meq | PACK | Freq: Once | ORAL | Status: AC
  Administered 2023-11-01: 40 meq
  Filled 2023-11-01: qty 2

## 2023-11-01 MED ORDER — HEPARIN SOD (PORK) LOCK FLUSH 100 UNIT/ML IV SOLN
500.0000 [IU] | Freq: Once | INTRAVENOUS | Status: AC
  Administered 2023-11-01: 500 [IU]
  Filled 2023-11-01: qty 5

## 2023-11-01 MED ORDER — HYDROMORPHONE HCL 1 MG/ML IJ SOLN
1.0000 mg | Freq: Once | INTRAMUSCULAR | Status: AC
  Administered 2023-11-01: 1 mg via INTRAVENOUS
  Filled 2023-11-01: qty 1

## 2023-12-17 ENCOUNTER — Other Ambulatory Visit: Payer: Self-pay | Admitting: Pharmacist

## 2024-01-15 ENCOUNTER — Encounter (HOSPITAL_COMMUNITY): Payer: Self-pay | Admitting: Emergency Medicine

## 2024-01-15 ENCOUNTER — Emergency Department (HOSPITAL_COMMUNITY)
Admission: EM | Admit: 2024-01-15 | Discharge: 2024-01-15 | Disposition: A | Discharge status: Home / Self Care | Payer: Medicare (Managed Care) | Attending: Emergency Medicine | Admitting: Emergency Medicine

## 2024-01-15 ENCOUNTER — Emergency Department (HOSPITAL_COMMUNITY): Payer: Medicare (Managed Care)

## 2024-01-15 ENCOUNTER — Other Ambulatory Visit: Payer: Self-pay

## 2024-01-15 DIAGNOSIS — R109 Unspecified abdominal pain: Secondary | ICD-10-CM | POA: Diagnosis present

## 2024-01-15 DIAGNOSIS — Z7982 Long term (current) use of aspirin: Secondary | ICD-10-CM | POA: Insufficient documentation

## 2024-01-15 DIAGNOSIS — R1084 Generalized abdominal pain: Secondary | ICD-10-CM | POA: Diagnosis not present

## 2024-01-15 DIAGNOSIS — R197 Diarrhea, unspecified: Secondary | ICD-10-CM | POA: Insufficient documentation

## 2024-01-15 DIAGNOSIS — R1110 Vomiting, unspecified: Secondary | ICD-10-CM | POA: Diagnosis not present

## 2024-01-15 LAB — URINALYSIS, ROUTINE W REFLEX MICROSCOPIC
Bilirubin Urine: NEGATIVE
Glucose, UA: NEGATIVE mg/dL
Hgb urine dipstick: NEGATIVE
Ketones, ur: 5 mg/dL — AB
Leukocytes,Ua: NEGATIVE
Nitrite: NEGATIVE
Protein, ur: NEGATIVE mg/dL
Specific Gravity, Urine: >1.046 — ABNORMAL HIGH (ref 1.005–1.030)
pH: 5 (ref 5.0–8.0)

## 2024-01-15 LAB — COMPREHENSIVE METABOLIC PANEL
ALT: 36 U/L (ref 0–44)
AST: 43 U/L — ABNORMAL HIGH (ref 15–41)
Albumin: 4 g/dL (ref 3.5–5.0)
Alkaline Phosphatase: 61 U/L (ref 38–126)
Anion gap: 15 (ref 5–15)
BUN: 21 mg/dL — ABNORMAL HIGH (ref 6–20)
CO2: 18 mmol/L — ABNORMAL LOW (ref 22–32)
Calcium: 8.6 mg/dL — ABNORMAL LOW (ref 8.9–10.3)
Chloride: 105 mmol/L (ref 98–111)
Creatinine, Ser: 0.85 mg/dL (ref 0.61–1.24)
GFR, Estimated: >60 mL/min (ref >60)
Glucose, Bld: 97 mg/dL (ref 70–99)
Potassium: 3.7 mmol/L (ref 3.5–5.1)
Sodium: 138 mmol/L (ref 135–145)
Total Bilirubin: 0.6 mg/dL (ref 0.0–1.2)
Total Protein: 7.2 g/dL (ref 6.5–8.1)

## 2024-01-15 LAB — CBC
HCT: 52.6 % — ABNORMAL HIGH (ref 39.0–52.0)
Hemoglobin: 16.2 g/dL (ref 13.0–17.0)
MCH: 30.3 pg (ref 26.0–34.0)
MCHC: 30.8 g/dL (ref 30.0–36.0)
MCV: 98.3 fL (ref 80.0–100.0)
Platelets: 103 10*3/uL — ABNORMAL LOW (ref 150–400)
RBC: 5.35 MIL/uL (ref 4.22–5.81)
RDW: 13.4 % (ref 11.5–15.5)
WBC: 6.6 10*3/uL (ref 4.0–10.5)
nRBC: 0 % (ref 0.0–0.2)

## 2024-01-15 LAB — TROPONIN I (HIGH SENSITIVITY): Troponin I (High Sensitivity): 5 ng/L (ref <18)

## 2024-01-15 LAB — LIPASE, BLOOD: Lipase: 23 U/L (ref 11–51)

## 2024-01-15 MED ORDER — HEPARIN SOD (PORK) LOCK FLUSH 100 UNIT/ML IV SOLN
INTRAVENOUS | Status: AC
  Administered 2024-01-15: 500 [IU]
  Filled 2024-01-15: qty 5

## 2024-01-15 MED ORDER — KETOROLAC TROMETHAMINE 15 MG/ML IJ SOLN
15.0000 mg | Freq: Once | INTRAMUSCULAR | Status: AC
  Administered 2024-01-15: 15 mg via INTRAVENOUS
  Filled 2024-01-15: qty 1

## 2024-01-15 MED ORDER — SODIUM CHLORIDE 0.9 % IV BOLUS
500.0000 mL | Freq: Once | INTRAVENOUS | Status: AC
  Administered 2024-01-15: 500 mL via INTRAVENOUS

## 2024-01-15 MED ORDER — SODIUM CHLORIDE 0.9 % IV BOLUS
1000.0000 mL | Freq: Once | INTRAVENOUS | Status: AC
  Administered 2024-01-15: 1000 mL via INTRAVENOUS

## 2024-01-15 MED ORDER — MORPHINE SULFATE (PF) 4 MG/ML IV SOLN
4.0000 mg | Freq: Once | INTRAVENOUS | Status: AC
  Administered 2024-01-15: 4 mg via INTRAVENOUS
  Filled 2024-01-15: qty 1

## 2024-01-15 MED ORDER — MAGNESIUM SULFATE 2 GM/50ML IV SOLN: 2.0000 g | Freq: Once | INTRAVENOUS | Status: DC

## 2024-01-15 MED ORDER — HYDROMORPHONE HCL 1 MG/ML IJ SOLN
1.0000 mg | Freq: Once | INTRAMUSCULAR | Status: AC
  Administered 2024-01-15: 1 mg via INTRAVENOUS
  Filled 2024-01-15: qty 1

## 2024-01-15 MED ORDER — DICYCLOMINE HCL 10 MG/ML IM SOLN
20.0000 mg | Freq: Once | INTRAMUSCULAR | Status: AC
  Administered 2024-01-15: 20 mg via INTRAMUSCULAR
  Filled 2024-01-15: qty 2

## 2024-01-15 MED ORDER — IOHEXOL 300 MG/ML  SOLN
100.0000 mL | Freq: Once | INTRAMUSCULAR | Status: AC | PRN
  Administered 2024-01-15: 100 mL via INTRAVENOUS

## 2024-01-15 MED ORDER — DIPHENHYDRAMINE HCL 50 MG/ML IJ SOLN
12.5000 mg | Freq: Once | INTRAMUSCULAR | Status: AC
  Administered 2024-01-15: 12.5 mg via INTRAVENOUS
  Filled 2024-01-15: qty 1

## 2024-01-15 MED ORDER — DIPHENHYDRAMINE HCL 50 MG/ML IJ SOLN
25.0000 mg | Freq: Once | INTRAMUSCULAR | Status: AC
  Administered 2024-01-15: 25 mg via INTRAVENOUS
  Filled 2024-01-15: qty 1

## 2024-01-15 MED ORDER — PANTOPRAZOLE SODIUM 40 MG IV SOLR
40.0000 mg | Freq: Once | INTRAVENOUS | Status: AC
  Administered 2024-01-15: 40 mg via INTRAVENOUS
  Filled 2024-01-15: qty 10

## 2024-01-15 MED ORDER — SODIUM CHLORIDE 0.9 % IV SOLN
25.0000 mg | Freq: Once | INTRAVENOUS | Status: AC
  Administered 2024-01-15: 25 mg via INTRAVENOUS
  Filled 2024-01-15: qty 25

## 2024-01-15 MED ORDER — BACITRACIN ZINC 500 UNIT/GM EX OINT
TOPICAL_OINTMENT | Freq: Once | CUTANEOUS | Status: AC
  Administered 2024-01-15: 1 via TOPICAL
  Filled 2024-01-15: qty 0.9

## 2024-01-15 MED ORDER — SODIUM CHLORIDE 0.9 % IV SOLN
25.0000 mg | Freq: Once | INTRAVENOUS | Status: AC
  Administered 2024-01-15: 25 mg via INTRAVENOUS
  Filled 2024-01-15: qty 1

## 2024-01-15 NOTE — Discharge Instructions (Addendum)
 As discussed, your labs and imaging are reassuring. You will receive a call from the hospital if the wound culture grows bacteria and you need antibiotics, otherwise you will not hear from the hospital.  Continue taking your medications for pain and nausea as prescribed.  I have sent an ambulatory referral for GI to get a second opinion. The closest location was here in [place]. In the meantime, keep your appointment with your GI doctor at [facility] for reevaluation.  Get help right away if: You cannot stop vomiting. Your pain is only in one part of your belly, like on the right side. You have bloody or black poop, or poop that looks like tar. You have trouble breathing. You have chest pain.

## 2024-01-15 NOTE — ED Triage Notes (Addendum)
 Pt presents to the ED via POV with complaints of pain around his feeding tube x 3 days. Pt notes having a hx of jtube with intermittent pain but the pain has been more persistent. He notes having 2-3 episodes of emesis over the last few days. Pt endorses chronic pain in the stomach - rates the pain tonight 10/10 and last took Tylenol PTA. Of note, the patient hasn't been able to use his J tube in over a week - informed his GI provider whom is unable to see him until 3/17. A&Ox4 at this time. Denies CP or SOB.

## 2024-01-15 NOTE — ED Provider Notes (Signed)
 [name] [facility] Provider Note   CSN: [id number] Arrival date & time: 01/15/24  4098     History  Chief Complaint  Patient presents with   Edwin Hall Tube Issue    Edwin Hall is a 58 y.o. male with a history of Barrett's esophagus, malnutrition, and IBS presents the ED today for pain at the site of his feeding tube.  States the pain is mainly at the site of his feeding tube but also radiates across his stomach and up to his chest.  Patient reports he had a J-tube placed about 4 months ago at [facility].  Since then he has been having intermittent pain at the site.  He states that there is some pink/brown discharge from the wound site which she is spoken with his gastroenterologist about.  He was treated last week for an infection for it, but is still concerned about it.  For the past 4 days he reports constant abdominal pain, vomiting, and diarrhea. No dysuria, hematuria, or flank pain. Reports low-grade fevers at home as well.  He has been taking Phenergan and Tylenol at home without improvement of symptoms.  He has tried to get in with his GI provider at [facility] but is unable to see them until 3/17.  Denies any other complaints or concerns at this time.    Home Medications Prior to Admission medications   Medication Sig Start Date End Date Taking? Authorizing Provider  acetaminophen (TYLENOL) 325 MG tablet Take 2 tablets (650 mg total) by mouth every 6 (six) hours as needed for mild pain, fever or headache (or Fever >/= 101). 05/25/23   Rolly Salter, MD  aspirin 81 MG EC tablet Take 81 mg by mouth at bedtime. Swallow whole.    [provider]  atorvastatin (LIPITOR) 40 MG tablet Take 1 tablet by mouth at bedtime. 04/22/23   [provider]  B Complex-C (B-COMPLEX WITH VITAMIN C) tablet Take 1 tablet by mouth daily. 05/26/23   Rolly Salter, MD  Cholecalciferol (VITAMIN D3) 10 MCG (400 UNIT) CAPS Take 400 Units by mouth daily.     [provider]  copper tablet Take 1 tablet (2 mg total) by mouth daily. 05/26/23   Rolly Salter, MD  Cyanocobalamin (VITAMIN B 12 PO) Take 1,000 mcg by mouth daily.    [provider]  Erenumab-aooe (AIMOVIG) 140 MG/ML SOAJ Inject into the skin. 04/29/23   [provider]  famotidine (PEPCID) 40 MG tablet Take 1 tablet (40 mg total) by mouth daily. 05/25/23   Rolly Salter, MD  Ferrous Sulfate (IRON) 325 (65 Fe) MG TABS Take 325 mg by mouth every other day.  05/12/20   [provider]  HYDROcodone-acetaminophen (NORCO/VICODIN) 5-325 MG tablet Take 1 tablet by mouth every 6 (six) hours as needed for moderate pain or severe pain. 05/25/23 05/24/24  Rolly Salter, MD  magnesium oxide (MAG-OX) 400 (240 Mg) MG tablet Take 1 tablet (400 mg total) by mouth 2 (two) times daily. 05/25/23   Rolly Salter, MD  Multiple Vitamin (MULTIVITAMIN WITH MINERALS) TABS tablet Take 1 tablet by mouth daily.    [provider]  Oxymetazoline HCl (QC NASAL SPRAY NA) Place 1 spray into the nose daily as needed (migraines). Pt unable to tell me what the name is but states it is supposed to help with migraines    [provider]  pantoprazole (PROTONIX) 40 MG tablet Take 40 mg by  mouth 2 (two) times daily.    [provider]  promethazine (PHENERGAN) 25 MG tablet Take 1 tablet (25 mg total) by mouth every 6 (six) hours as needed for nausea or vomiting. 05/25/23   Rolly Salter, MD  promethazine (PHENERGAN) 6.25 MG/5ML solution 20 mLs (25 mg total) by Per J Tube route every 8 (eight) hours as needed for nausea or vomiting. 09/10/23   Gloris Manchester, MD  rizatriptan (MAXALT) 10 MG tablet Take by mouth. 03/24/23   [provider]  sucralfate (CARAFATE) 1 g tablet Take 1 tablet (1 g total) by mouth 4 (four) times daily -  with meals and at bedtime. 10/17/20   Tilden Fossa, MD  thiamine (VITAMIN B1) 100 MG tablet Take 1 tablet (100 mg total) by mouth daily.  05/25/23   Rolly Salter, MD  topiramate (TOPAMAX) 25 MG tablet Take 1 tablet (25 mg total) by mouth 2 (two) times daily. 05/25/23   Rolly Salter, MD  traZODone (DESYREL) 50 MG tablet Take 50 mg by mouth at bedtime.    [provider]  Zinc 50 MG TABS Take 50 mg by mouth daily.     [provider]  azelastine (ASTELIN) 0.1 % nasal spray 2 sprays per nostril twice a day for control of nasal drainage. Patient not taking: Reported on 05/02/2020 11/27/19 05/02/20  Marcelyn Bruins, MD      Allergies    Mushroom extract complex (obsolete), Other, Shellfish allergy, Reglan [metoclopramide], Zofran [ondansetron hcl], Ergotamine, and Tetracyclines & related    Review of Systems   Review of Systems  Gastrointestinal:  Positive for abdominal pain.  All other systems reviewed and are negative.   Physical Exam Updated Vital Signs BP 130/87 (BP Location: Left Arm)   Pulse 72   Temp 98.2 F (36.8 C) (Oral)   Resp 13   Ht 5\' 8"  (1.727 m)   Wt 49 kg   SpO2 94%   BMI 16.42 kg/m  Physical Exam Vitals and nursing note reviewed.  Constitutional:      General: He is not in acute distress.    Appearance: Normal appearance.  HENT:     Head: Normocephalic and atraumatic.     Mouth/Throat:     Mouth: Mucous membranes are moist.  Eyes:     Conjunctiva/sclera: Conjunctivae normal.     Pupils: Pupils are equal, round, and reactive to light.  Cardiovascular:     Rate and Rhythm: Normal rate and regular rhythm.     Pulses: Normal pulses.     Heart sounds: Normal heart sounds.  Pulmonary:     Effort: Pulmonary effort is normal.     Breath sounds: Normal breath sounds.  Abdominal:     General: There is no distension.     Palpations: Abdomen is soft.     Tenderness: There is abdominal tenderness. There is no right CVA tenderness, left CVA tenderness, guarding or rebound.     Comments: Feeding tube present in RLQ. No erythema, warmth to touch, or discharge from the tube  site. Diffuse abdominal tenderness to palpation without distention, guarding, or rebound  Musculoskeletal:        General: Normal range of motion.     Cervical back: Normal range of motion.  Skin:    General: Skin is warm and dry.     Findings: No erythema or rash.  Neurological:     General: No focal deficit present.     Mental Status: He is alert.  Psychiatric:        Mood and Affect: Mood normal.        Behavior: Behavior normal.    ED Results / Procedures / Treatments   Labs (all labs ordered are listed, but only abnormal results are displayed) Labs Reviewed  COMPREHENSIVE METABOLIC PANEL - Abnormal; Notable for the following components:      Result Value   CO2 18 (*)    BUN 21 (*)    Calcium 8.6 (*)    AST 43 (*)    All other components within normal limits  CBC - Abnormal; Notable for the following components:   HCT 52.6 (*)    Platelets 103 (*)    All other components within normal limits  URINALYSIS, ROUTINE W REFLEX MICROSCOPIC - Abnormal; Notable for the following components:   Specific Gravity, Urine >1.046 (*)    Ketones, ur 5 (*)    All other components within normal limits  AEROBIC/ANAEROBIC CULTURE W GRAM STAIN (SURGICAL/DEEP WOUND)  LIPASE, BLOOD  TROPONIN I (HIGH SENSITIVITY)    EKG None  Radiology CT ABDOMEN PELVIS W CONTRAST Result Date: 01/15/2024 CLINICAL DATA:  Acute, nonlocalized abdominal pain EXAM: CT ABDOMEN AND PELVIS WITH CONTRAST TECHNIQUE: Multidetector CT imaging of the abdomen and pelvis was performed using the standard protocol following bolus administration of intravenous contrast. RADIATION DOSE REDUCTION: This exam was performed according to the departmental dose-optimization program which includes automated exposure control, adjustment of the mA and/or kV according to patient size and/or use of iterative reconstruction technique. CONTRAST:  OMNIPAQUE IOHEXOL 300 MG/ML  SOLN COMPARISON:  10/31/2023 FINDINGS: Lower chest: Scarring at  the left lung base is unchanged. Elevated left diaphragm. Hepatobiliary: No focal liver abnormality.Cholecystectomy. Prominence of the biliary tree is unchanged and may be from reservoir effect, CBD measuring up to 8 mm. Pancreas: Unremarkable. Spleen: Unremarkable. Adrenals/Urinary Tract: Negative adrenals. No hydronephrosis or stone. Unremarkable bladder. Stomach/Bowel: Located percutaneous jejunostomy tube with unchanged thickening of the rectus abdominus where traversed by the tube. 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 No evidence of bowel inflammation or obstruction. Vascular/Lymphatic: No acute vascular abnormality. Atheromatous calcification of the aorta. Mild prominence of mesenteric lymph nodes is unchanged. Reproductive:No pathologic findings. Retracted right testicle into the inguinal canal. Other: No ascites or pneumoperitoneum. Musculoskeletal: No acute abnormalities. L4-5 degenerative facet spurring and mild anterolisthesis. IMPRESSION: No acute finding or change from prior. Electronically Signed   By: Tiburcio Pea M.D.   On: 01/15/2024 10:00    Procedures Procedures: not indicated.   Medications Ordered in ED Medications  sodium chloride 0.9 % bolus 1,000 mL (0 mLs Intravenous Stopped 01/15/24 0905)  promethazine (PHENERGAN) 25 mg in sodium chloride 0.9 % 50 mL IVPB (0 mg Intravenous Stopped 01/15/24 0811)  morphine (PF) 4 MG/ML injection 4 mg (4 mg Intravenous Given 01/15/24 0708)  pantoprazole (PROTONIX) injection 40 mg (40 mg Intravenous Given 01/15/24 0834)  dicyclomine (BENTYL) injection 20 mg (20 mg Intramuscular Given 01/15/24 0834)  HYDROmorphone (DILAUDID) injection 1 mg (1 mg Intravenous Given 01/15/24 0933)  iohexol (OMNIPAQUE) 300 MG/ML solution 100 mL (100 mLs Intravenous Contrast Given 01/15/24 0913)  diphenhydrAMINE (BENADRYL) injection 25 mg (25 mg Intravenous Given 01/15/24 0933)  sodium chloride 0.9 % bolus 500 mL (0 mLs  Intravenous Stopped 01/15/24 1231)  bacitracin ointment (1 Application Topical Given 01/15/24 1148)  promethazine (PHENERGAN) 25 mg in sodium chloride 0.9 % 50 mL IVPB (0 mg Intravenous Stopped 01/15/24 1211)  ketorolac (TORADOL)  15 MG/ML injection 15 mg (15 mg Intravenous Given 01/15/24 1148)  diphenhydrAMINE (BENADRYL) injection 12.5 mg (12.5 mg Intravenous Given 01/15/24 1229)  heparin lock flush 100 UNIT/ML injection (500 Units  Given 01/15/24 1305)    ED Course/ Medical Decision Making/ A&P                                 Medical Decision Making Amount and/or Complexity of Data Reviewed Labs: ordered. Radiology: ordered.  Risk OTC drugs. Prescription drug management.   This patient presents to the ED for concern of feeding tube site pain, this involves an extensive number of treatment options, and is a complaint that carries with it a high risk of complications and morbidity.   Differential diagnosis includes: cellulitis, IBS, IBD, bowel obstruction, perforation, peritonitis, constipation, j-tube complication, UTI, etc.   Comorbidities  See HPI above   Additional History  Additional history obtained from prior records   Lab Tests  I ordered and personally interpreted labs.  The pertinent results include:   BUN of 21 otherwise CMP, lipase, and CBC unremarkable UA shows no sign of infection Negative troponin Wound culture pending   Imaging Studies  I ordered imaging studies including CT abdomen/pelvis  I independently visualized and interpreted imaging which showed:  No acute findings or changes from prior I agree with the radiologist interpretation   Problem List / ED Course / Critical Interventions / Medication Management  Patient reports abdominal pain for the past 3 days has been having pain in the lower right lower quadrant since J-tube placement 4 months ago.  He has follow-up with GI at [facility].  He has appointment with 3/12.States that he has been having  abdominal pain with vomiting and diarrhea. He is also worried about possible infection at the tube site.  He was seen at an ED in near [place] the other day and was given antibiotics for tube infection and has completed them. He still states that he is having some pink/brown secretions at the incision site.  I ordered medications including: Morphine for pain Phenergan for nausea Normal saline for dehydration  Reevaluation of the patient after these medicines showed that the patient stayed the same.  Patient reports the pain did not improve with morphine nor Protonix and Bentyl. I then gave patient Dilaudid, which did not help either. Patient felt itchy after receiving contrast for CT imaging.  No shortness of breath or tightening of throat sensation.  Benadryl given with resolution of symptoms. Denies history of contrast allergy. Discussed findings of labs and imaging with patient.  Information given for local GI for patient second opinion.  All questions answered.   Social Determinants of Health  Access to healthcare   Test / Admission - Considered  Patient is stable and safe for discharge home. Return precautions provided.       Final Clinical Impression(s) / ED Diagnoses Final diagnoses:  Generalized abdominal pain    Rx / DC Orders ED Discharge Orders          Ordered    Ambulatory referral to Gastroenterology        01/15/24 1115              Maxwell Marion, PA-C 01/15/24 1408    Benjiman Core, MD 01/15/24 [phone]

## 2024-01-18 LAB — AEROBIC CULTURE W GRAM STAIN (SUPERFICIAL SPECIMEN): Gram Stain: NONE SEEN

## 2024-01-19 ENCOUNTER — Telehealth (HOSPITAL_BASED_OUTPATIENT_CLINIC_OR_DEPARTMENT_OTHER): Payer: Self-pay

## 2024-01-19 NOTE — Progress Notes (Signed)
 ED Antimicrobial Stewardship Positive Culture Follow Up   Edwin Hall is an 58 y.o. male who presented to [facility] on 01/15/2024 with a chief complaint of  Chief Complaint  Patient presents with   Feeeding Tube Issue    Recent Results (from the past 720 hours)  Aerobic Culture w Gram Stain (superficial specimen)     Status: None   Collection Time: 01/15/24  5:21 PM   Specimen: Nasal  Result Value Ref Range Status   Specimen Description   Final    NASAL SWAB Performed at [facility], [address], [place], [place] [address]    Special Requests   Final    NONE Performed at [facility], [address], [place], [place] [address]    Gram Stain   Final    NO WBC SEEN RARE GRAM POSITIVE COCCI Performed at [facility], [address], [place], [place] [address]    Culture   Final    RARE PSEUDOMONAS AERUGINOSA MODERATE STAPHYLOCOCCUS EPIDERMIDIS    Report Status 01/18/2024 FINAL  Final   Organism ID, Bacteria PSEUDOMONAS AERUGINOSA  Final   Organism ID, Bacteria STAPHYLOCOCCUS EPIDERMIDIS  Final      Susceptibility   Pseudomonas aeruginosa - MIC*    CEFTAZIDIME 2 SENSITIVE Sensitive     CIPROFLOXACIN <=0.25 SENSITIVE Sensitive     GENTAMICIN <=1 SENSITIVE Sensitive     IMIPENEM 2 SENSITIVE Sensitive     PIP/TAZO 8 SENSITIVE Sensitive ug/mL    CEFEPIME 2 SENSITIVE Sensitive     * RARE PSEUDOMONAS AERUGINOSA   Staphylococcus epidermidis - MIC*    CIPROFLOXACIN <=0.5 SENSITIVE Sensitive     ERYTHROMYCIN >=8 RESISTANT Resistant     GENTAMICIN <=0.5 SENSITIVE Sensitive     OXACILLIN <=0.25 SENSITIVE Sensitive     TETRACYCLINE <=1 SENSITIVE Sensitive     VANCOMYCIN 1 SENSITIVE Sensitive     TRIMETH/SULFA <=10 SENSITIVE Sensitive     CLINDAMYCIN <=0.25 SENSITIVE Sensitive     RIFAMPIN <=0.5 SENSITIVE Sensitive     Inducible Clindamycin NEGATIVE Sensitive     * MODERATE STAPHYLOCOCCUS EPIDERMIDIS    63 YOM who had a J-tube  placed 4 months ago at [facility] who presented on 3/2 with complaints of pain at the site, pink/brown discharge, abd pain, vomiting/diarrhea, subjective low grade fevers.   No discharge or erythema was noted on clinical exam, Abd CT showed no acute findings. The patient was afebrile and WBC wnl.  A superficial swab was collected with grew out MSSE + Pseudomonas however it is unclear the reliability of this given it is a superficial swab.   The patient was seen by his FM-MD on 3/4 with no mention of infectious concerns at that time. The patient had his pain medication refilled and was told to f/u with GI for J-tube removal.   Given clinical findings during ED visit, unreliability of superficial swab, no infectious complaints at FM-MD visit on 3/4, and already scheduled appt with GI on 3/17 - plan is to hold antibiotic treatment at this time.   ED Provider: Estanislado Pandy, DO  Thank you for allowing pharmacy to be a part of this patient's care.  Georgina Pillion, PharmD, BCPS, BCIDP Infectious Diseases Clinical Pharmacist 01/19/2024 8:51 AM   **Pharmacist phone directory can now be found on [email or web address] ([email or web address]).  Listed under [facility].

## 2024-01-19 NOTE — Telephone Encounter (Signed)
 Post ED Visit - Positive Culture Follow-up  Culture report reviewed by antimicrobial stewardship pharmacist: [facility] []  Enzo Bi, Pharm.D. []  Celedonio Miyamoto, Pharm.D., BCPS AQ-ID []  Garvin Fila, Pharm.D., BCPS []  Georgina Pillion, Pharm.D., BCPS []  [place], [address].D., BCPS, AAHIVP []  Estella Husk, Pharm.D., BCPS, AAHIVP []  Lysle Pearl, PharmD, BCPS []  Phillips Climes, PharmD, BCPS []  Agapito Games, PharmD, BCPS []  Verlan Friends, PharmD []  Mervyn Gay, PharmD, BCPS []  Vinnie Level, PharmD  [facility] [x]  Georgina Pillion, PharmD []  Greer Pickerel, PharmD []  Adalberto Cole, PharmD []  Perlie Gold, Rph []  Lonell Face) Jean Rosenthal, PharmD []  Earl Many, PharmD []  Junita Push, PharmD []  Dorna Leitz, PharmD []  Terrilee Files, PharmD []  Lynann Beaver, PharmD []  Keturah Barre, PharmD []  Loralee Pacas, PharmD []  Bernadene Person, PharmD   Positive superficial Nasal swab culture Reviewed by ED provider - Estanislado Pandy, DO  Unreliability of superficial swab, no infectious complaints at FM-MD visit on 01/17/24 and already scheduled appt with GI on 3/17 - plan is to hold antibiotic treatment at this time. No further patient follow-up is required at this time.  Sandria Senter 01/19/2024, 10:13 AM
